# Patient Record
Sex: Female | Born: 1950
Health system: Southern US, Community
[De-identification: ages and names within clinical notes are randomized; demographics above are authoritative.]

## PROBLEM LIST (undated history)

## (undated) DIAGNOSIS — R32 Unspecified urinary incontinence: Secondary | ICD-10-CM

## (undated) DIAGNOSIS — F329 Major depressive disorder, single episode, unspecified: Secondary | ICD-10-CM

## (undated) DIAGNOSIS — E538 Deficiency of other specified B group vitamins: Secondary | ICD-10-CM

## (undated) DIAGNOSIS — E034 Atrophy of thyroid (acquired): Secondary | ICD-10-CM

## (undated) DIAGNOSIS — G458 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: Secondary | ICD-10-CM

## (undated) DIAGNOSIS — R011 Cardiac murmur, unspecified: Secondary | ICD-10-CM

## (undated) DIAGNOSIS — E1140 Type 2 diabetes mellitus with diabetic neuropathy, unspecified: Secondary | ICD-10-CM

## (undated) DIAGNOSIS — G2581 Restless legs syndrome: Secondary | ICD-10-CM

## (undated) DIAGNOSIS — E119 Type 2 diabetes mellitus without complications: Secondary | ICD-10-CM

## (undated) DIAGNOSIS — M797 Fibromyalgia: Secondary | ICD-10-CM

## (undated) DIAGNOSIS — I480 Paroxysmal atrial fibrillation: Secondary | ICD-10-CM

## (undated) DIAGNOSIS — I10 Essential (primary) hypertension: Secondary | ICD-10-CM

## (undated) DIAGNOSIS — F32A Depression, unspecified: Secondary | ICD-10-CM

## (undated) DIAGNOSIS — M810 Age-related osteoporosis without current pathological fracture: Secondary | ICD-10-CM

## (undated) DIAGNOSIS — E785 Hyperlipidemia, unspecified: Secondary | ICD-10-CM

## (undated) DIAGNOSIS — K222 Esophageal obstruction: Secondary | ICD-10-CM

## (undated) DIAGNOSIS — G609 Hereditary and idiopathic neuropathy, unspecified: Secondary | ICD-10-CM

## (undated) DIAGNOSIS — M1990 Unspecified osteoarthritis, unspecified site: Secondary | ICD-10-CM

## (undated) DIAGNOSIS — K08109 Complete loss of teeth, unspecified cause, unspecified class: Secondary | ICD-10-CM

## (undated) DIAGNOSIS — Z972 Presence of dental prosthetic device (complete) (partial): Secondary | ICD-10-CM

## (undated) DIAGNOSIS — R413 Other amnesia: Secondary | ICD-10-CM

## (undated) DIAGNOSIS — I4891 Unspecified atrial fibrillation: Secondary | ICD-10-CM

## (undated) DIAGNOSIS — R260 Ataxic gait: Secondary | ICD-10-CM

## (undated) DIAGNOSIS — F5101 Primary insomnia: Secondary | ICD-10-CM

## (undated) DIAGNOSIS — IMO0002 Reserved for concepts with insufficient information to code with codable children: Secondary | ICD-10-CM

## (undated) DIAGNOSIS — Z973 Presence of spectacles and contact lenses: Secondary | ICD-10-CM

## (undated) DIAGNOSIS — L03311 Cellulitis of abdominal wall: Secondary | ICD-10-CM

## (undated) DIAGNOSIS — G4700 Insomnia, unspecified: Secondary | ICD-10-CM

## (undated) DIAGNOSIS — F419 Anxiety disorder, unspecified: Secondary | ICD-10-CM

## (undated) HISTORY — DX: Hereditary and idiopathic neuropathy, unspecified: G60.9

## (undated) HISTORY — DX: Cardiac murmur, unspecified: R01.1

## (undated) HISTORY — DX: Essential (primary) hypertension: I10

## (undated) HISTORY — DX: Type 2 diabetes mellitus with diabetic neuropathy, unspecified: E11.40

## (undated) HISTORY — DX: Cellulitis of abdominal wall: L03.311

## (undated) HISTORY — DX: Hyperlipidemia, unspecified: E78.5

## (undated) HISTORY — DX: 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: G45.8

## (undated) HISTORY — DX: Unspecified atrial fibrillation: I48.91

## (undated) HISTORY — DX: Ataxic gait: R26.0

## (undated) HISTORY — PX: UMBILICAL HERNIA REPAIR: SHX196

## (undated) HISTORY — DX: Deficiency of other specified B group vitamins: E53.8

## (undated) HISTORY — DX: Restless legs syndrome: G25.81

## (undated) HISTORY — DX: Other amnesia: R41.3

## (undated) HISTORY — DX: Type 2 diabetes mellitus without complications: E11.9

## (undated) HISTORY — PX: CATARACT EXTRACTION, BILATERAL: SHX1313

## (undated) HISTORY — DX: Paroxysmal atrial fibrillation: I48.0

## (undated) HISTORY — DX: Fibromyalgia: M79.7

## (undated) HISTORY — PX: KNEE ARTHROSCOPY W/ LATERAL RETINACULAR REPAIR: SHX1875

## (undated) HISTORY — DX: Esophageal obstruction: K22.2

## (undated) HISTORY — DX: Anxiety disorder, unspecified: F41.9

## (undated) HISTORY — DX: Reserved for concepts with insufficient information to code with codable children: IMO0002

## (undated) HISTORY — DX: Insomnia, unspecified: G47.00

## (undated) HISTORY — DX: Primary insomnia: F51.01

## (undated) HISTORY — DX: Unspecified urinary incontinence: R32

## (undated) HISTORY — DX: Atrophy of thyroid (acquired): E03.4

---

## 1969-07-17 HISTORY — PX: CHOLECYSTECTOMY: SHX55

## 1981-07-17 HISTORY — PX: BREAST REDUCTION SURGERY: SHX8

## 1996-07-17 HISTORY — PX: KNEE ARTHROSCOPY: SHX127

## 2001-11-19 ENCOUNTER — Other Ambulatory Visit: Admission: RE | Admit: 2001-11-19 | Discharge: 2001-11-19 | Payer: Self-pay | Admitting: Obstetrics and Gynecology

## 2002-07-17 HISTORY — PX: DILATION AND CURETTAGE OF UTERUS: SHX78

## 2002-09-30 ENCOUNTER — Encounter: Payer: Self-pay | Admitting: Obstetrics and Gynecology

## 2002-10-03 ENCOUNTER — Ambulatory Visit (HOSPITAL_COMMUNITY): Admission: RE | Admit: 2002-10-03 | Discharge: 2002-10-03 | Payer: Self-pay | Admitting: Obstetrics and Gynecology

## 2002-10-03 ENCOUNTER — Encounter (INDEPENDENT_AMBULATORY_CARE_PROVIDER_SITE_OTHER): Payer: Self-pay | Admitting: Specialist

## 2003-06-09 ENCOUNTER — Other Ambulatory Visit: Admission: RE | Admit: 2003-06-09 | Discharge: 2003-06-09 | Payer: Self-pay | Admitting: Obstetrics and Gynecology

## 2005-03-14 ENCOUNTER — Other Ambulatory Visit: Admission: RE | Admit: 2005-03-14 | Discharge: 2005-03-14 | Payer: Self-pay | Admitting: *Deleted

## 2005-07-17 HISTORY — PX: SHOULDER ARTHROSCOPY W/ ROTATOR CUFF REPAIR: SHX2400

## 2006-02-02 ENCOUNTER — Encounter: Admission: RE | Admit: 2006-02-02 | Discharge: 2006-02-02 | Payer: Self-pay | Admitting: Obstetrics & Gynecology

## 2006-04-04 ENCOUNTER — Other Ambulatory Visit: Admission: RE | Admit: 2006-04-04 | Discharge: 2006-04-04 | Payer: Self-pay | Admitting: Obstetrics & Gynecology

## 2006-06-27 ENCOUNTER — Ambulatory Visit (HOSPITAL_BASED_OUTPATIENT_CLINIC_OR_DEPARTMENT_OTHER): Admission: RE | Admit: 2006-06-27 | Discharge: 2006-06-27 | Payer: Self-pay | Admitting: Orthopedic Surgery

## 2007-04-10 ENCOUNTER — Other Ambulatory Visit: Admission: RE | Admit: 2007-04-10 | Discharge: 2007-04-10 | Payer: Self-pay | Admitting: Obstetrics and Gynecology

## 2008-07-17 HISTORY — PX: DIAGNOSTIC LAPAROSCOPY: SUR761

## 2010-08-07 ENCOUNTER — Encounter: Payer: Self-pay | Admitting: Obstetrics & Gynecology

## 2010-12-02 LAB — HM COLONOSCOPY

## 2010-12-02 NOTE — Op Note (Signed)
NAMEGWENDOLA, April Munoz                             ACCOUNT NO.:  [id number]   MEDICAL RECORD NO.:  [id number]                   PATIENT TYPE:  AMB   LOCATION:  DAY                                  FACILITY:  [facility]   PHYSICIAN:  Laqueta Linden, M.D.                 DATE OF BIRTH:  13-Nov-1950   DATE OF PROCEDURE:  10/03/2002  DATE OF DISCHARGE:                                 OPERATIVE REPORT   PREOPERATIVE DIAGNOSIS:  Abnormal uterine bleeding due to endometrial  polyps.   POSTOPERATIVE DIAGNOSIS:  Abnormal uterine bleeding due to endometrial  polyps.   PROCEDURE:  Hysteroscopic resection.   SURGEON:  Laqueta Linden, M.D.   ANESTHESIA:  MAC sedation with paracervical block.   ESTIMATED BLOOD LOSS:  Less than 50 mL.   SORBITOL NET INTAKE:  90 mL.   COMPLICATIONS:  None.   INDICATIONS:  April Munoz is a 60 year old, perimenopausal female with  persistent abnormal uterine bleeding on cyclic progesterone.  Pelvic  ultrasound showed evidence of adenomyosis and two posterior wall polyps.  The patient is therefore to undergo outpatient hysteroscopic resection of  the polyps in an effort to improve her bleeding pattern.  She is then  informed of the risks, benefits, alternatives, and complications of the  procedure, including the limitations of the procedure and the likelihood  that she will need continued progesterone cycling postoperatively.  She has  seen the informed consent film, voiced her understanding and acceptance of  all risks and limitations and agrees to proceed.   DESCRIPTION OF PROCEDURE:  The patient was taken to the operating room and  after proper identification and consents were ascertained, she was placed on  the operating table in the supine position.  After adequate sedation was  accomplished, she was placed in the Senath stirrups and the perineum and  vagina were prepped and draped in a routine sterile fashion.  A  transurethral Foley was placed which was removed  at the conclusion of the  procedure.  Bimanual examination confirmed an anterior, normal-size, mobile  uterus.  A speculum was placed in the vagina.  The vagina was noted to be  extremely long and the cervix way up in the vagina with a negligible amount  of descent noted.  The endometrial cavity sounded to approximately 9 cm.  The internal os was gently dilated to a #33 Pratt dilator after placement of  a paracervical block, utilizing 10 mL of 1% plain Xylocaine  circumferentially.  The resectoscope was then inserted under direct vision  using continuous sorbitol infusion and video.  The endocervical canal was  free of lesions.  There were two posterior wall polyps, measuring  approximately 1 cm, consistent with the findings on sonohysterogram.  Both  tubal ostia were visualized.  There were no other lesions identified within  the endometrial cavity.  The resectoscope was placed on routine settings,  and the posterior fundal endometrium was then resected in a systematic  fashion with removal of both polyps and the majority of the posterior  endometrial tissue as well.  Several small bleeding points were cauterized.  All tissue pieces were evacuated and sent to pathology.  The resectoscope  was then removed.  There was no active bleeding from the tenaculum site or  from the cervix.  Estimated blood loss was less than 50 mL.  Sorbitol net  intake 90 mL.  Complications none.   The patient was awakened and stable on transfer to the recovery room.  She  will be observed and discharged per anesthesia protocol.  She received  Toradol 30 mg IV and 30 mg IM at the initiation of the procedure for use for  postoperative cramping.  She will be discharged per anesthesia and is to  follow up in the office in 2-3 weeks' time.  She was given routine verbal  and written discharge instructions.  She was told to continue taking all of  her routine medications and take Advil, Aleve, or Tylenol as needed for   cramping.  She was to call prior to scheduled follow up for excessive pain,  fever, bleeding, or other concerns.                                               Laqueta Linden, M.D.    LKS/MEDQ  D:  10/03/2002  T:  10/03/2002  Job:  [id number]

## 2010-12-02 NOTE — Op Note (Signed)
[name], [name]                 ACCOUNT NO.:  [id number]   MEDICAL RECORD NO.:  [id number]          PATIENT TYPE:  AMB   LOCATION:  DSC                          FACILITY:  [facility]   PHYSICIAN:  Mila Homer. Sherlean Foot, M.D. DATE OF BIRTH:  06/19/1951   DATE OF PROCEDURE:  06/27/2006  DATE OF DISCHARGE:                               OPERATIVE REPORT   SURGEON:  Georgena Spurling, MD   ASSISTANT:  None.   ANESTHESIA:  General.   PREOPERATIVE DIAGNOSIS:  Left shoulder impingement syndrome.   POSTOPERATIVE DIAGNOSIS:  Left shoulder impingement syndrome.   PROCEDURE:  Left shoulder arthroscopy, subacromial decompression,  glenohumeral debridement.   INDICATIONS FOR PROCEDURE:  The patient is a 60 year old with __________  symptoms, MRI evidence of tendinosus of the rotator cuff, type-2  acromion, and some potential internal derangement.  Informed consent was  obtained.   DESCRIPTION OF PROCEDURE:  The patient was laid supine under general  anesthesia.  The patient was placed in the beach chair position.  The  left shoulder was prepped and draped in the usual sterile fashion.  Inferolateral and inferomedial portals were created with a #11 blade  __________ and then arthroscopy revealed no chondromalacia.  There was  anterior labral tearing as well as approximately 30% of the biceps,  which caused a large frayed area of the tendon impinging in the joint.  This was debrided with a 3.2 gator shaver.  The undersurface of the  rotator cuff had some partial thickness tearing and this was debrided as  well.  I then went into the subacromial space.  I performed a bursectomy  with a 3.2 shaver through the lateral portal.  I then cleaned off the  undersurface of the acromion and performed an acromioplasty but did not  violate the AC joint.  I released the CA ligament with ArthroCare  debridement wand.  Then, lavaged and closed with 4-0 nylon sutures.  Dressed with adaptic and 4 x 4's, ABDs, 2-inch  silk tape, and simple  sling.   COMPLICATIONS:  None.   DRAINS:  None.           ______________________________  Mila Homer. Sherlean Foot, M.D.     SDL/MEDQ  D:  06/27/2006  T:  06/28/2006  Job:  [id number]

## 2012-08-15 DIAGNOSIS — G609 Hereditary and idiopathic neuropathy, unspecified: Secondary | ICD-10-CM | POA: Diagnosis not present

## 2012-08-15 DIAGNOSIS — IMO0002 Reserved for concepts with insufficient information to code with codable children: Secondary | ICD-10-CM | POA: Diagnosis not present

## 2012-09-26 DIAGNOSIS — IMO0002 Reserved for concepts with insufficient information to code with codable children: Secondary | ICD-10-CM | POA: Diagnosis not present

## 2012-09-26 DIAGNOSIS — G609 Hereditary and idiopathic neuropathy, unspecified: Secondary | ICD-10-CM | POA: Diagnosis not present

## 2012-10-01 DIAGNOSIS — R269 Unspecified abnormalities of gait and mobility: Secondary | ICD-10-CM | POA: Diagnosis not present

## 2012-10-03 DIAGNOSIS — R269 Unspecified abnormalities of gait and mobility: Secondary | ICD-10-CM | POA: Diagnosis not present

## 2012-10-15 DIAGNOSIS — R269 Unspecified abnormalities of gait and mobility: Secondary | ICD-10-CM | POA: Diagnosis not present

## 2012-10-18 DIAGNOSIS — R269 Unspecified abnormalities of gait and mobility: Secondary | ICD-10-CM | POA: Diagnosis not present

## 2012-11-14 DIAGNOSIS — M7500 Adhesive capsulitis of unspecified shoulder: Secondary | ICD-10-CM | POA: Diagnosis not present

## 2012-11-14 DIAGNOSIS — M719 Bursopathy, unspecified: Secondary | ICD-10-CM | POA: Diagnosis not present

## 2012-11-14 DIAGNOSIS — S42253A Displaced fracture of greater tuberosity of unspecified humerus, initial encounter for closed fracture: Secondary | ICD-10-CM | POA: Diagnosis not present

## 2012-11-14 DIAGNOSIS — M25519 Pain in unspecified shoulder: Secondary | ICD-10-CM | POA: Diagnosis not present

## 2012-11-14 DIAGNOSIS — M67919 Unspecified disorder of synovium and tendon, unspecified shoulder: Secondary | ICD-10-CM | POA: Diagnosis not present

## 2012-11-25 DIAGNOSIS — M1710 Unilateral primary osteoarthritis, unspecified knee: Secondary | ICD-10-CM | POA: Diagnosis not present

## 2012-11-25 DIAGNOSIS — M21169 Varus deformity, not elsewhere classified, unspecified knee: Secondary | ICD-10-CM | POA: Diagnosis not present

## 2012-11-25 DIAGNOSIS — M25569 Pain in unspecified knee: Secondary | ICD-10-CM | POA: Diagnosis not present

## 2012-11-25 DIAGNOSIS — M6281 Muscle weakness (generalized): Secondary | ICD-10-CM | POA: Diagnosis not present

## 2012-11-25 DIAGNOSIS — R269 Unspecified abnormalities of gait and mobility: Secondary | ICD-10-CM | POA: Diagnosis not present

## 2013-02-24 DIAGNOSIS — G609 Hereditary and idiopathic neuropathy, unspecified: Secondary | ICD-10-CM | POA: Insufficient documentation

## 2013-02-24 DIAGNOSIS — F329 Major depressive disorder, single episode, unspecified: Secondary | ICD-10-CM | POA: Insufficient documentation

## 2013-02-24 DIAGNOSIS — E119 Type 2 diabetes mellitus without complications: Secondary | ICD-10-CM

## 2013-02-24 DIAGNOSIS — M797 Fibromyalgia: Secondary | ICD-10-CM

## 2013-02-24 DIAGNOSIS — E785 Hyperlipidemia, unspecified: Secondary | ICD-10-CM

## 2013-02-24 DIAGNOSIS — I10 Essential (primary) hypertension: Secondary | ICD-10-CM

## 2013-02-24 DIAGNOSIS — I119 Hypertensive heart disease without heart failure: Secondary | ICD-10-CM | POA: Insufficient documentation

## 2013-02-24 DIAGNOSIS — IMO0002 Reserved for concepts with insufficient information to code with codable children: Secondary | ICD-10-CM

## 2013-02-24 DIAGNOSIS — E782 Mixed hyperlipidemia: Secondary | ICD-10-CM | POA: Insufficient documentation

## 2013-02-24 DIAGNOSIS — M810 Age-related osteoporosis without current pathological fracture: Secondary | ICD-10-CM | POA: Insufficient documentation

## 2013-02-24 HISTORY — DX: Type 2 diabetes mellitus without complications: E11.9

## 2013-02-24 HISTORY — DX: Hereditary and idiopathic neuropathy, unspecified: G60.9

## 2013-02-24 HISTORY — DX: Reserved for concepts with insufficient information to code with codable children: IMO0002

## 2013-02-24 HISTORY — DX: Fibromyalgia: M79.7

## 2013-02-24 HISTORY — DX: Hypertensive heart disease without heart failure: I11.9

## 2013-02-24 HISTORY — DX: Essential (primary) hypertension: I10

## 2013-02-24 HISTORY — DX: Hyperlipidemia, unspecified: E78.5

## 2013-02-24 HISTORY — DX: Mixed hyperlipidemia: E78.2

## 2013-03-14 DIAGNOSIS — G2581 Restless legs syndrome: Secondary | ICD-10-CM | POA: Insufficient documentation

## 2013-03-14 HISTORY — DX: Restless legs syndrome: G25.81

## 2013-11-06 ENCOUNTER — Encounter (HOSPITAL_BASED_OUTPATIENT_CLINIC_OR_DEPARTMENT_OTHER): Payer: Self-pay | Admitting: *Deleted

## 2013-11-06 ENCOUNTER — Other Ambulatory Visit: Payer: Self-pay | Admitting: Orthopedic Surgery

## 2013-11-06 NOTE — Progress Notes (Signed)
Will need istat and ekg if needed-called pcp for old notes

## 2013-11-11 ENCOUNTER — Ambulatory Visit (HOSPITAL_BASED_OUTPATIENT_CLINIC_OR_DEPARTMENT_OTHER): Payer: Medicare HMO | Admitting: Certified Registered"

## 2013-11-11 ENCOUNTER — Ambulatory Visit (HOSPITAL_BASED_OUTPATIENT_CLINIC_OR_DEPARTMENT_OTHER)
Admission: RE | Admit: 2013-11-11 | Discharge: 2013-11-11 | Disposition: A | Discharge status: Home / Self Care | Payer: Medicare HMO | Source: Ambulatory Visit | Attending: Orthopedic Surgery | Admitting: Orthopedic Surgery

## 2013-11-11 ENCOUNTER — Encounter (HOSPITAL_BASED_OUTPATIENT_CLINIC_OR_DEPARTMENT_OTHER): Payer: Self-pay | Admitting: Orthopedic Surgery

## 2013-11-11 ENCOUNTER — Encounter (HOSPITAL_BASED_OUTPATIENT_CLINIC_OR_DEPARTMENT_OTHER): Payer: Medicare HMO | Admitting: Certified Registered"

## 2013-11-11 ENCOUNTER — Encounter (HOSPITAL_BASED_OUTPATIENT_CLINIC_OR_DEPARTMENT_OTHER)
Admission: RE | Disposition: A | Discharge status: Home / Self Care | Payer: Self-pay | Source: Ambulatory Visit | Attending: Orthopedic Surgery

## 2013-11-11 DIAGNOSIS — I10 Essential (primary) hypertension: Secondary | ICD-10-CM | POA: Insufficient documentation

## 2013-11-11 DIAGNOSIS — M65849 Other synovitis and tenosynovitis, unspecified hand: Secondary | ICD-10-CM

## 2013-11-11 DIAGNOSIS — M1990 Unspecified osteoarthritis, unspecified site: Secondary | ICD-10-CM | POA: Insufficient documentation

## 2013-11-11 DIAGNOSIS — Z888 Allergy status to other drugs, medicaments and biological substances status: Secondary | ICD-10-CM | POA: Insufficient documentation

## 2013-11-11 DIAGNOSIS — M6530 Trigger finger, unspecified finger: Secondary | ICD-10-CM | POA: Insufficient documentation

## 2013-11-11 DIAGNOSIS — F3289 Other specified depressive episodes: Secondary | ICD-10-CM | POA: Insufficient documentation

## 2013-11-11 DIAGNOSIS — F329 Major depressive disorder, single episode, unspecified: Secondary | ICD-10-CM | POA: Insufficient documentation

## 2013-11-11 DIAGNOSIS — M65839 Other synovitis and tenosynovitis, unspecified forearm: Secondary | ICD-10-CM | POA: Insufficient documentation

## 2013-11-11 DIAGNOSIS — M6740 Ganglion, unspecified site: Secondary | ICD-10-CM | POA: Insufficient documentation

## 2013-11-11 DIAGNOSIS — Z79899 Other long term (current) drug therapy: Secondary | ICD-10-CM | POA: Insufficient documentation

## 2013-11-11 DIAGNOSIS — E1142 Type 2 diabetes mellitus with diabetic polyneuropathy: Secondary | ICD-10-CM | POA: Insufficient documentation

## 2013-11-11 DIAGNOSIS — E1149 Type 2 diabetes mellitus with other diabetic neurological complication: Secondary | ICD-10-CM | POA: Insufficient documentation

## 2013-11-11 HISTORY — DX: Type 2 diabetes mellitus without complications: E11.9

## 2013-11-11 HISTORY — DX: Complete loss of teeth, unspecified cause, unspecified class: K08.109

## 2013-11-11 HISTORY — PX: TRIGGER FINGER RELEASE: SHX641

## 2013-11-11 HISTORY — DX: Presence of spectacles and contact lenses: Z97.3

## 2013-11-11 HISTORY — DX: Essential (primary) hypertension: I10

## 2013-11-11 HISTORY — DX: Unspecified osteoarthritis, unspecified site: M19.90

## 2013-11-11 HISTORY — PX: MASS EXCISION: SHX2000

## 2013-11-11 HISTORY — DX: Depression, unspecified: F32.A

## 2013-11-11 HISTORY — DX: Complete loss of teeth, unspecified cause, unspecified class: Z97.2

## 2013-11-11 HISTORY — DX: Major depressive disorder, single episode, unspecified: F32.9

## 2013-11-11 HISTORY — DX: Age-related osteoporosis without current pathological fracture: M81.0

## 2013-11-11 LAB — POCT I-STAT, CHEM 8
BUN: 13 mg/dL (ref 6–23)
Calcium, Ion: 1.13 mmol/L (ref 1.13–1.30)
Chloride: 105 mEq/L (ref 96–112)
Creatinine, Ser: 0.8 mg/dL (ref 0.50–1.10)
Glucose, Bld: 123 mg/dL — ABNORMAL HIGH (ref 70–99)
HCT: 41 % (ref 36.0–46.0)
Hemoglobin: 13.9 g/dL (ref 12.0–15.0)
Potassium: 5 mEq/L (ref 3.7–5.3)
Sodium: 142 mEq/L (ref 137–147)
TCO2: 30 mmol/L (ref 0–100)

## 2013-11-11 LAB — GLUCOSE, CAPILLARY: Glucose-Capillary: 101 mg/dL — ABNORMAL HIGH (ref 70–99)

## 2013-11-11 SURGERY — EXCISION MASS
Anesthesia: Regional | Site: Finger | Laterality: Left

## 2013-11-11 MED ORDER — HYDROMORPHONE HCL PF 1 MG/ML IJ SOLN: 0.2500 mg | INTRAMUSCULAR | Status: DC | PRN

## 2013-11-11 MED ORDER — OXYCODONE HCL 5 MG PO TABS: 5.0000 mg | ORAL_TABLET | Freq: Once | ORAL | Status: DC | PRN

## 2013-11-11 MED ORDER — LIDOCAINE HCL (CARDIAC) 20 MG/ML IV SOLN
INTRAVENOUS | Status: DC | PRN
  Administered 2013-11-11: 30 mg via INTRAVENOUS

## 2013-11-11 MED ORDER — FENTANYL CITRATE 0.05 MG/ML IJ SOLN
INTRAMUSCULAR | Status: DC | PRN
  Administered 2013-11-11: 50 ug via INTRAVENOUS

## 2013-11-11 MED ORDER — CHLORHEXIDINE GLUCONATE 4 % EX LIQD: 60.0000 mL | Freq: Once | CUTANEOUS | Status: DC

## 2013-11-11 MED ORDER — BUPIVACAINE HCL (PF) 0.25 % IJ SOLN
INTRAMUSCULAR | Status: AC
  Filled 2013-11-11: qty 30

## 2013-11-11 MED ORDER — MIDAZOLAM HCL 2 MG/2ML IJ SOLN
INTRAMUSCULAR | Status: AC
  Filled 2013-11-11: qty 2

## 2013-11-11 MED ORDER — LIDOCAINE HCL (PF) 0.5 % IJ SOLN
INTRAMUSCULAR | Status: DC | PRN
Start: 2013-11-11 — End: 2013-11-11
  Administered 2013-11-11: 30 mL via INTRAVENOUS

## 2013-11-11 MED ORDER — CEFAZOLIN SODIUM-DEXTROSE 2-3 GM-% IV SOLR
INTRAVENOUS | Status: AC
  Filled 2013-11-11: qty 50

## 2013-11-11 MED ORDER — CEFAZOLIN SODIUM-DEXTROSE 2-3 GM-% IV SOLR: 2.0000 g | INTRAVENOUS | Status: DC

## 2013-11-11 MED ORDER — BUPIVACAINE HCL (PF) 0.25 % IJ SOLN
INTRAMUSCULAR | Status: DC | PRN
  Administered 2013-11-11: 9 mL

## 2013-11-11 MED ORDER — ONDANSETRON HCL 4 MG/2ML IJ SOLN
INTRAMUSCULAR | Status: DC | PRN
  Administered 2013-11-11: 4 mg via INTRAVENOUS

## 2013-11-11 MED ORDER — MIDAZOLAM HCL 5 MG/5ML IJ SOLN
INTRAMUSCULAR | Status: DC | PRN
  Administered 2013-11-11: 1 mg via INTRAVENOUS

## 2013-11-11 MED ORDER — OXYCODONE HCL 5 MG/5ML PO SOLN: 5.0000 mg | Freq: Once | ORAL | Status: DC | PRN

## 2013-11-11 MED ORDER — FENTANYL CITRATE 0.05 MG/ML IJ SOLN
INTRAMUSCULAR | Status: AC
  Filled 2013-11-11: qty 2

## 2013-11-11 MED ORDER — MIDAZOLAM HCL 2 MG/2ML IJ SOLN: 1.0000 mg | INTRAMUSCULAR | Status: DC | PRN

## 2013-11-11 MED ORDER — PROMETHAZINE HCL 25 MG/ML IJ SOLN: 6.2500 mg | INTRAMUSCULAR | Status: DC | PRN

## 2013-11-11 MED ORDER — HYDROCODONE-ACETAMINOPHEN 5-325 MG PO TABS: 1.0000 | ORAL_TABLET | Freq: Four times a day (QID) | ORAL | Status: DC | PRN

## 2013-11-11 MED ORDER — CEFAZOLIN SODIUM-DEXTROSE 2-3 GM-% IV SOLR
2.0000 g | INTRAVENOUS | Status: AC
  Administered 2013-11-11: 2 g via INTRAVENOUS

## 2013-11-11 MED ORDER — PROPOFOL INFUSION 10 MG/ML OPTIME
INTRAVENOUS | Status: DC | PRN
Start: 2013-11-11 — End: 2013-11-11
  Administered 2013-11-11: 75 ug/kg/min via INTRAVENOUS

## 2013-11-11 MED ORDER — LACTATED RINGERS IV SOLN
INTRAVENOUS | Status: DC
  Administered 2013-11-11: 08:00:00 via INTRAVENOUS

## 2013-11-11 MED ORDER — FENTANYL CITRATE 0.05 MG/ML IJ SOLN: 50.0000 ug | INTRAMUSCULAR | Status: DC | PRN

## 2013-11-11 SURGICAL SUPPLY — 55 items
BANDAGE COBAN STERILE 2 (GAUZE/BANDAGES/DRESSINGS) ×2 IMPLANT
BLADE 15 SAFETY STRL DISP (BLADE) ×2 IMPLANT
BLADE MINI RND TIP GREEN BEAV (BLADE) ×1 IMPLANT
BNDG CMPR 9X4 STRL LF SNTH (GAUZE/BANDAGES/DRESSINGS)
BNDG COHESIVE 1X5 TAN STRL LF (GAUZE/BANDAGES/DRESSINGS) IMPLANT
BNDG COHESIVE 3X5 TAN STRL LF (GAUZE/BANDAGES/DRESSINGS) IMPLANT
BNDG ESMARK 4X9 LF (GAUZE/BANDAGES/DRESSINGS) IMPLANT
BNDG GAUZE ELAST 4 BULKY (GAUZE/BANDAGES/DRESSINGS) IMPLANT
CHLORAPREP W/TINT 26ML (MISCELLANEOUS) ×2 IMPLANT
CORDS BIPOLAR (ELECTRODE) ×2 IMPLANT
COVER MAYO STAND STRL (DRAPES) ×2 IMPLANT
COVER TABLE BACK 60X90 (DRAPES) ×2 IMPLANT
CUFF TOURNIQUET SINGLE 18IN (TOURNIQUET CUFF) ×1 IMPLANT
DECANTER SPIKE VIAL GLASS SM (MISCELLANEOUS) IMPLANT
DRAIN PENROSE 1/2X12 LTX STRL (WOUND CARE) IMPLANT
DRAPE EXTREMITY T 121X128X90 (DRAPE) ×2 IMPLANT
DRAPE SURG 17X23 STRL (DRAPES) ×2 IMPLANT
GAUZE SPONGE 4X4 12PLY STRL (GAUZE/BANDAGES/DRESSINGS) ×2 IMPLANT
GAUZE XEROFORM 1X8 LF (GAUZE/BANDAGES/DRESSINGS) ×2 IMPLANT
GLOVE BIO SURGEON STRL SZ7.5 (GLOVE) ×1 IMPLANT
GLOVE BIOGEL PI IND STRL 7.0 (GLOVE) IMPLANT
GLOVE BIOGEL PI IND STRL 8 (GLOVE) IMPLANT
GLOVE BIOGEL PI IND STRL 8.5 (GLOVE) ×1 IMPLANT
GLOVE BIOGEL PI INDICATOR 7.0 (GLOVE) ×1
GLOVE BIOGEL PI INDICATOR 8 (GLOVE) ×1
GLOVE BIOGEL PI INDICATOR 8.5 (GLOVE) ×1
GLOVE ECLIPSE 7.0 STRL STRAW (GLOVE) ×1 IMPLANT
GLOVE SURG ORTHO 8.0 STRL STRW (GLOVE) ×2 IMPLANT
GOWN STRL REUS W/ TWL LRG LVL3 (GOWN DISPOSABLE) ×1 IMPLANT
GOWN STRL REUS W/TWL LRG LVL3 (GOWN DISPOSABLE) ×2
GOWN STRL REUS W/TWL XL LVL3 (GOWN DISPOSABLE) ×3 IMPLANT
NDL BLD DRAW 23GX1  MC LAB (NEEDLE) IMPLANT
NDL SAFETY ECLIPSE 18X1.5 (NEEDLE) IMPLANT
NEEDLE 27GAX1X1/2 (NEEDLE) ×2 IMPLANT
NEEDLE BLD DRAW 23GX1   MC LAB (NEEDLE) ×1
NEEDLE BLD DRAW 23GX1  MC LAB (NEEDLE) ×1 IMPLANT
NEEDLE HYPO 18GX1.5 SHARP (NEEDLE)
NS IRRIG 1000ML POUR BTL (IV SOLUTION) ×2 IMPLANT
PACK BASIN DAY SURGERY FS (CUSTOM PROCEDURE TRAY) ×2 IMPLANT
PAD CAST 3X4 CTTN HI CHSV (CAST SUPPLIES) IMPLANT
PADDING CAST ABS 3INX4YD NS (CAST SUPPLIES)
PADDING CAST ABS 4INX4YD NS (CAST SUPPLIES) ×1
PADDING CAST ABS COTTON 3X4 (CAST SUPPLIES) IMPLANT
PADDING CAST ABS COTTON 4X4 ST (CAST SUPPLIES) ×1 IMPLANT
PADDING CAST COTTON 3X4 STRL (CAST SUPPLIES)
SPLINT PLASTER CAST XFAST 3X15 (CAST SUPPLIES) IMPLANT
SPLINT PLASTER XTRA FASTSET 3X (CAST SUPPLIES)
STOCKINETTE 4X48 STRL (DRAPES) ×2 IMPLANT
SUT VIC AB 4-0 P2 18 (SUTURE) IMPLANT
SUT VICRYL RAPID 5 0 P 3 (SUTURE) IMPLANT
SUT VICRYL RAPIDE 4/0 PS 2 (SUTURE) ×2 IMPLANT
SYR BULB 3OZ (MISCELLANEOUS) ×2 IMPLANT
SYR CONTROL 10ML LL (SYRINGE) ×2 IMPLANT
TOWEL OR 17X24 6PK STRL BLUE (TOWEL DISPOSABLE) ×4 IMPLANT
UNDERPAD 30X30 INCONTINENT (UNDERPADS AND DIAPERS) ×2 IMPLANT

## 2013-11-11 NOTE — Discharge Instructions (Addendum)

## 2013-11-11 NOTE — H&P (Signed)
April Munoz is a 63 year-old left-hand dominant female with a mass on her left index finger dorsal aspect distal interphalangeal joint.  This has been present for four to five months.  She has opened it on several occasions thinking it was a foreign body which she was unable to retrieve. She states that it drained a clear fluid. She has a history of diabetes, peripheral neuropathy, she has a mass on her right palm.  She also complains of triggering of her left ring.  She has no history of other injuries. She does have history of diabetes and history of arthritis.  There is family history of arthritis.  She is [place] descent.  She does not have masses on her feet. She complains of a constant, moderate throbbing aching type pain with a feeling of swelling to the index finger.She has completed her course of antibiotics; there is no evidence of infection at the present time.     ALLERGIES:      None. MEDICATIONS:      Metoprolol, metformin and venlafaxine. SURGICAL HISTORY:     Stomach hernia, cholecystectomy, knee surgery. FAMILY MEDICAL HISTORY:    Positive for heart disease, high blood pressure and arthritis.  SOCIAL HISTORY:      She does not smoke or drink. She is married.  She does not work outside the home.  REVIEW OF SYSTEMS:      Positive for glasses, hearing loss, ringing in her ears, high blood pressure, balance problems, nervousness, sleep disorder, anemia, peripheral neuropathy secondary to her diabetes.   April Munoz is an 63 y.o. female.   Chief Complaint: mucoid cyst left index finger and STS left ring finger HPI: see above  Past Medical History  Diagnosis Date  . Hypertension   . Diabetes mellitus without complication   . Arthritis   . Wears glasses   . Full dentures   . Osteoporosis   . Depression     Past Surgical History  Procedure Laterality Date  . Umbilical hernia repair  2008  . Diagnostic laparoscopy  2010    lysis of adhesions  . Knee arthroscopy  1998    left   . Cholecystectomy  1971    open  . Shoulder arthroscopy w/ rotator cuff repair  2007    left  . Dilation and curettage of uterus  2004    History reviewed. No pertinent family history. Social History:  reports that she has never smoked. She does not have any smokeless tobacco history on file. She reports that she does not drink alcohol or use illicit drugs.  Allergies:  Allergies  Allergen Reactions  . Prednisone     In high doses causes her to feel crazy    No prescriptions prior to admission    No results found for this or any previous visit (from the past 48 hour(s)).  No results found.   Pertinent items are noted in HPI.  Height 5' 7.5" (1.715 m), weight 200 lb (90.719 kg).  General appearance: alert, cooperative and appears stated age Head: Normocephalic, without obvious abnormality Neck: no JVD Resp: clear to auscultation bilaterally Cardio: regular rate and rhythm, S1, S2 normal, no murmur, click, rub or gallop GI: soft, non-tender; bowel sounds normal; no masses,  no organomegaly Extremities: extremities normal, atraumatic, no cyanosis or edema Pulses: 2+ and symmetric Skin: Skin color, texture, turgor normal. No rashes or lesions Neurologic: Grossly normal Incision/Wound: na  Assessment/Plan We have discussed the possibility of excision of the cyst with debridement  distal interphalangeal joint of her left index finger along with release of the A-1 pulley of the left ring finger.    The pre, peri and postoperative course were discussed along with the risks and complications.  The patient is aware there is no guarantee with the surgery, possibility of infection, recurrence, injury to arteries, nerves, tendons, incomplete relief of symptoms and dystrophy. She is scheduled for release of A-1 pulley left ring and excision of mucoid cyst with debridement distal interphalangeal joint left index as an outpatient under regional anesthesia.  Wynonia Sours 11/11/2013,  5:28 AM

## 2013-11-11 NOTE — Anesthesia Postprocedure Evaluation (Signed)
  Anesthesia Post-op Note  Patient: April Munoz  Procedure(s) Performed: Procedure(s) with comments: EXCISION MUCOID CYST LEFT INDEX FINGER/DEBRIDEMENT LEFT INDEX FINGER (Left) - ANESTHESIA: IV REGIONAL/FAB RELEASE A-1 PULLEY LEFT RING FINGER (Left)  Patient Location: PACU  Anesthesia Type:Regional  Level of Consciousness: awake and alert   Airway and Oxygen Therapy: Patient Spontanous Breathing  Post-op Pain: mild  Post-op Assessment: Post-op Vital signs reviewed  Post-op Vital Signs: stable  Last Vitals:  Filed Vitals:   11/11/13 1122  BP: 138/85  Pulse: 95  Temp: 36.6 C  Resp: 16    Complications: No apparent anesthesia complications

## 2013-11-11 NOTE — Brief Op Note (Signed)
11/11/2013  10:09 AM  PATIENT:  April Munoz  63 y.o. female  PRE-OPERATIVE DIAGNOSIS:  MUCOID TUMOR LEFT INDEX FINGER/DEGENERATIVE ARTHRITIS/DISTAL JOINT LEFT INDEX FINGER/TRIGGER LEFT RING FINGER  POST-OPERATIVE DIAGNOSIS:  MUCOID TUMOR LEFT INDEX FINGER/DEGENERATIVE ARTHRITIS/DISTAL JOINT LEFT INDEX FINGER/TRIGGER LEFT RING FINGER  PROCEDURE:  Procedure(s) with comments: EXCISION MUCOID CYST LEFT INDEX FINGER/DEBRIDEMENT LEFT INDEX FINGER (Left) - ANESTHESIA: IV REGIONAL/FAB RELEASE A-1 PULLEY LEFT RING FINGER (Left)  SURGEON:  Surgeon(s) and Role:    * Wynonia Sours, MD - Primary    * Tennis Must, MD - Assisting  PHYSICIAN ASSISTANT:   ASSISTANTS: K Carter Kassel,MD   ANESTHESIA:   local and regional  EBL:  Total I/O In: 600 [I.V.:600] Out: -   BLOOD ADMINISTERED:none  DRAINS: none   LOCAL MEDICATIONS USED:  BUPIVICAINE   SPECIMEN:  Excision  DISPOSITION OF SPECIMEN:  PATHOLOGY  COUNTS:  YES  TOURNIQUET:   Total Tourniquet Time Documented: Upper Arm (Left) - 31 minutes Total: Upper Arm (Left) - 31 minutes   DICTATION: .Other Dictation: Dictation Number [id number]  PLAN OF CARE: Discharge to home after PACU  PATIENT DISPOSITION:  PACU - hemodynamically stable.

## 2013-11-11 NOTE — Anesthesia Preprocedure Evaluation (Addendum)
Anesthesia Evaluation  Patient identified by MRN, date of birth, ID band  History of Anesthesia Complications Negative for: history of anesthetic complications  Airway Mallampati: I  Neck ROM: Full    Dental  (+) Edentulous Upper, Edentulous Lower   Pulmonary  breath sounds clear to auscultation        Cardiovascular hypertension, Pt. on home beta blockers Rhythm:Regular Rate:Normal     Neuro/Psych PSYCHIATRIC DISORDERS Depression    GI/Hepatic   Endo/Other  diabetes, Oral Hypoglycemic Agents  Renal/GU      Musculoskeletal   Abdominal (+) + obese,   Peds  Hematology   Anesthesia Other Findings   Reproductive/Obstetrics                          Anesthesia Physical Anesthesia Plan  ASA: III  Anesthesia Plan: Regional   Post-op Pain Management:    Induction: Intravenous  Airway Management Planned: Simple Face Mask  Additional Equipment:   Intra-op Plan:   Post-operative Plan:   Informed Consent: I have reviewed the patients History and Physical, chart, labs and discussed the procedure including the risks, benefits and alternatives for the proposed anesthesia with the patient or authorized representative who has indicated his/her understanding and acceptance.     Plan Discussed with: CRNA and Surgeon  Anesthesia Plan Comments:         Anesthesia Quick Evaluation

## 2013-11-11 NOTE — Anesthesia Procedure Notes (Addendum)
Procedure Name: MAC Date/Time: 11/11/2013 9:30 AM Performed by: Morad Tal Pre-anesthesia Checklist: Patient identified, Emergency Drugs available, Suction available, Patient being monitored and Timeout performed Patient Re-evaluated:Patient Re-evaluated prior to inductionOxygen Delivery Method: Simple face mask   Anesthesia Regional Block:  Bier block (IV Regional)  Pre-Anesthetic Checklist: ,, timeout performed, Correct Patient, Correct Site, Correct Laterality, Correct Procedure,, site marked, surgical consent,, at surgeon's request Needles:  Injection technique: Single-shot  Needle Type: Other      Needle Gauge: 20 and 20 G    Additional Needles: Bier block (IV Regional) Narrative:   Performed by: Personally

## 2013-11-11 NOTE — Op Note (Signed)
Dictation Number [id number]

## 2013-11-11 NOTE — Transfer of Care (Signed)
Immediate Anesthesia Transfer of Care Note  Patient: April Munoz  Procedure(s) Performed: Procedure(s) with comments: EXCISION MUCOID CYST LEFT INDEX FINGER/DEBRIDEMENT LEFT INDEX FINGER (Left) - ANESTHESIA: IV REGIONAL/FAB RELEASE A-1 PULLEY LEFT RING FINGER (Left)  Patient Location: PACU  Anesthesia Type:Bier block  Level of Consciousness: awake, alert , oriented and patient cooperative  Airway & Oxygen Therapy: Patient Spontanous Breathing and Patient connected to face mask oxygen  Post-op Assessment: Report given to PACU RN and Post -op Vital signs reviewed and stable  Post vital signs: Reviewed and stable  Complications: No apparent anesthesia complications

## 2013-11-12 ENCOUNTER — Encounter (HOSPITAL_BASED_OUTPATIENT_CLINIC_OR_DEPARTMENT_OTHER): Payer: Self-pay | Admitting: Orthopedic Surgery

## 2013-11-12 NOTE — Op Note (Signed)
[name], April Munoz                 ACCOUNT NO.:  [id number]  MEDICAL RECORD NO.:  [id number]  LOCATION:                                 FACILITY:  PHYSICIAN:  April Munoz, M.D.            DATE OF BIRTH:  DATE OF PROCEDURE:  11/11/2013 DATE OF DISCHARGE:                              OPERATIVE REPORT   POSTOPERATIVE DIAGNOSES:  Mucoid tumor, left index finger, degenerative arthritis, distal interphalangeal joint, stenosing tenosynovitis, left ring finger.  POSTOPERATIVE DIAGNOSES:  Mucoid tumor, left index finger, degenerative arthritis, distal interphalangeal joint, stenosing tenosynovitis, left ring finger.  OPERATION:  Release A1 pulley, left ring finger with excision of flexor sheath cyst, excision of mucoid cyst with debridement distal interphalangeal joint, left index finger.  SURGEON:  April Brod, MD  ASSISTANT:  April Cover, MD  ANESTHESIA:  Forearm-based IV regional sedation with metacarpal block local infiltration.  ANESTHESIOLOGIST:  April Dach, MD  HISTORY:  The patient is a 63 year old female with a history of triggering of her left ring finger.  Mass present at the A1 pulley along with a mucoid cyst, and arthritis of the distal interphalangeal joint, left index finger.  The cyst has been opened in the past, was placed on antibiotics to include the possibility of infection.  She is admitted now for release of the A1 pulley of the left ring finger, excision of the mucoid tumor with debridement of the distal interphalangeal joint, left index finger.  She is aware of risks and complications including infection; recurrence of injury to arteries, nerves, tendons; incomplete relief of symptoms and dystrophy.  In preoperative area, the patient is seen, the extremity marked by both patient and surgeon, antibiotic given.  PROCEDURE IN DETAIL:  The patient was brought to the operating room, where a forearm-based IV regional anesthetic was carried out  without difficulty under the direction Dr. Orene Desanctis.  She was prepped using ChloraPrep, supine position with the left arm free.  A 3-minute dry time was allowed.  Time-out taken confirming the patient and procedure.  The ring finger was approached first.  An incision made on the volar aspect directly over the A1 pulley and carried down through subcutaneous tissue.  Retractors placed.  After identification of the A1 pulley protecting both radial and ulnar neurovascular bundles.  The A1 pulley was then released on its radial aspect.  A partial tenosynovectomy performed proximally.  A small incision made centrally in the A2 pulley. The finger placed through a full range of motion, no further triggering was noted.  The wound was irrigated and closed with interrupted 4-0 Vicryl Rapide sutures.  Separate incision was then made over the distal interphalangeal joint of the left index finger, carried down on the radial aspect mid lateral line, and carried down through subcutaneous tissue.  Bleeders were electrocauterized with bipolar.  The deflated cyst was then excised and sent to Pathology.  The joint was opened on its radial aspect and debridement of synovectomy performed with a small hemostatic type rongeur.  The wound was copiously irrigated with saline. The skin then closed with interrupted 4-0 Vicryl Rapide sutures.  A metacarpal block was  given to each of the fingers with an infiltration of the area of the A1 pulley on the ring finger.  Sterile compressive dressing was applied to each incision area along with a splint to the distal interphalangeal joint to the index.  On deflation of the tourniquet, remaining fingers pinked.  She was taken to the recovery room for observation in satisfactory condition.  Total of 9 mL of bupivacaine was used for anesthesia.  She tolerated the procedure well. She will be discharged home to return in 1 week on Norco.           ______________________________ April Munoz, M.D.     GK/MEDQ  D:  11/11/2013  T:  11/12/2013  Job:  [id number]

## 2014-05-06 DIAGNOSIS — E559 Vitamin D deficiency, unspecified: Secondary | ICD-10-CM | POA: Diagnosis not present

## 2014-05-06 DIAGNOSIS — E538 Deficiency of other specified B group vitamins: Secondary | ICD-10-CM | POA: Diagnosis not present

## 2014-05-06 DIAGNOSIS — E038 Other specified hypothyroidism: Secondary | ICD-10-CM | POA: Diagnosis not present

## 2014-05-06 DIAGNOSIS — E119 Type 2 diabetes mellitus without complications: Secondary | ICD-10-CM | POA: Diagnosis not present

## 2014-05-06 DIAGNOSIS — Z23 Encounter for immunization: Secondary | ICD-10-CM | POA: Diagnosis not present

## 2014-06-18 DIAGNOSIS — K2270 Barrett's esophagus without dysplasia: Secondary | ICD-10-CM | POA: Diagnosis not present

## 2014-06-18 DIAGNOSIS — K219 Gastro-esophageal reflux disease without esophagitis: Secondary | ICD-10-CM | POA: Diagnosis not present

## 2014-06-18 DIAGNOSIS — R1310 Dysphagia, unspecified: Secondary | ICD-10-CM | POA: Diagnosis not present

## 2014-06-18 DIAGNOSIS — K602 Anal fissure, unspecified: Secondary | ICD-10-CM | POA: Diagnosis not present

## 2014-07-03 DIAGNOSIS — K2270 Barrett's esophagus without dysplasia: Secondary | ICD-10-CM | POA: Diagnosis not present

## 2014-07-03 DIAGNOSIS — E079 Disorder of thyroid, unspecified: Secondary | ICD-10-CM | POA: Diagnosis not present

## 2014-07-03 DIAGNOSIS — Z79899 Other long term (current) drug therapy: Secondary | ICD-10-CM | POA: Diagnosis not present

## 2014-07-03 DIAGNOSIS — I10 Essential (primary) hypertension: Secondary | ICD-10-CM | POA: Diagnosis not present

## 2014-07-03 DIAGNOSIS — R1310 Dysphagia, unspecified: Secondary | ICD-10-CM | POA: Diagnosis not present

## 2014-07-03 DIAGNOSIS — K2210 Ulcer of esophagus without bleeding: Secondary | ICD-10-CM | POA: Diagnosis not present

## 2014-07-03 DIAGNOSIS — Z9884 Bariatric surgery status: Secondary | ICD-10-CM | POA: Diagnosis not present

## 2014-07-03 DIAGNOSIS — K6389 Other specified diseases of intestine: Secondary | ICD-10-CM | POA: Diagnosis not present

## 2014-07-03 DIAGNOSIS — K219 Gastro-esophageal reflux disease without esophagitis: Secondary | ICD-10-CM | POA: Diagnosis not present

## 2014-07-03 DIAGNOSIS — Z7982 Long term (current) use of aspirin: Secondary | ICD-10-CM | POA: Diagnosis not present

## 2014-07-03 DIAGNOSIS — E119 Type 2 diabetes mellitus without complications: Secondary | ICD-10-CM | POA: Diagnosis not present

## 2014-07-07 DIAGNOSIS — L821 Other seborrheic keratosis: Secondary | ICD-10-CM | POA: Diagnosis not present

## 2014-07-07 DIAGNOSIS — L739 Follicular disorder, unspecified: Secondary | ICD-10-CM | POA: Diagnosis not present

## 2014-07-07 DIAGNOSIS — L814 Other melanin hyperpigmentation: Secondary | ICD-10-CM | POA: Diagnosis not present

## 2014-08-11 DIAGNOSIS — E785 Hyperlipidemia, unspecified: Secondary | ICD-10-CM | POA: Diagnosis not present

## 2014-08-11 DIAGNOSIS — E038 Other specified hypothyroidism: Secondary | ICD-10-CM | POA: Diagnosis not present

## 2014-08-11 DIAGNOSIS — E538 Deficiency of other specified B group vitamins: Secondary | ICD-10-CM | POA: Diagnosis not present

## 2014-08-11 DIAGNOSIS — G4700 Insomnia, unspecified: Secondary | ICD-10-CM | POA: Diagnosis not present

## 2014-08-11 DIAGNOSIS — E119 Type 2 diabetes mellitus without complications: Secondary | ICD-10-CM | POA: Diagnosis not present

## 2014-08-11 DIAGNOSIS — G629 Polyneuropathy, unspecified: Secondary | ICD-10-CM | POA: Diagnosis not present

## 2014-08-11 DIAGNOSIS — Z6834 Body mass index (BMI) 34.0-34.9, adult: Secondary | ICD-10-CM | POA: Diagnosis not present

## 2014-08-11 DIAGNOSIS — I10 Essential (primary) hypertension: Secondary | ICD-10-CM | POA: Diagnosis not present

## 2014-08-11 DIAGNOSIS — F329 Major depressive disorder, single episode, unspecified: Secondary | ICD-10-CM | POA: Diagnosis not present

## 2014-08-11 DIAGNOSIS — R635 Abnormal weight gain: Secondary | ICD-10-CM | POA: Diagnosis not present

## 2014-08-11 DIAGNOSIS — E559 Vitamin D deficiency, unspecified: Secondary | ICD-10-CM | POA: Diagnosis not present

## 2014-08-12 DIAGNOSIS — K602 Anal fissure, unspecified: Secondary | ICD-10-CM | POA: Diagnosis not present

## 2014-08-12 DIAGNOSIS — K589 Irritable bowel syndrome without diarrhea: Secondary | ICD-10-CM | POA: Diagnosis not present

## 2014-08-12 DIAGNOSIS — T182XXA Foreign body in stomach, initial encounter: Secondary | ICD-10-CM | POA: Diagnosis not present

## 2014-08-14 DIAGNOSIS — Z1231 Encounter for screening mammogram for malignant neoplasm of breast: Secondary | ICD-10-CM | POA: Diagnosis not present

## 2014-08-19 DIAGNOSIS — E1142 Type 2 diabetes mellitus with diabetic polyneuropathy: Secondary | ICD-10-CM | POA: Diagnosis not present

## 2014-08-19 DIAGNOSIS — M2010 Hallux valgus (acquired), unspecified foot: Secondary | ICD-10-CM | POA: Diagnosis not present

## 2014-10-30 DIAGNOSIS — E663 Overweight: Secondary | ICD-10-CM | POA: Diagnosis not present

## 2014-10-30 DIAGNOSIS — E8881 Metabolic syndrome: Secondary | ICD-10-CM | POA: Diagnosis not present

## 2014-11-11 DIAGNOSIS — Z6833 Body mass index (BMI) 33.0-33.9, adult: Secondary | ICD-10-CM | POA: Diagnosis not present

## 2014-11-11 DIAGNOSIS — E559 Vitamin D deficiency, unspecified: Secondary | ICD-10-CM | POA: Diagnosis not present

## 2014-11-11 DIAGNOSIS — E119 Type 2 diabetes mellitus without complications: Secondary | ICD-10-CM | POA: Diagnosis not present

## 2014-11-11 DIAGNOSIS — E785 Hyperlipidemia, unspecified: Secondary | ICD-10-CM | POA: Diagnosis not present

## 2014-11-11 DIAGNOSIS — E1140 Type 2 diabetes mellitus with diabetic neuropathy, unspecified: Secondary | ICD-10-CM | POA: Diagnosis not present

## 2014-11-11 DIAGNOSIS — J301 Allergic rhinitis due to pollen: Secondary | ICD-10-CM | POA: Diagnosis not present

## 2014-11-11 DIAGNOSIS — E538 Deficiency of other specified B group vitamins: Secondary | ICD-10-CM | POA: Diagnosis not present

## 2014-11-11 DIAGNOSIS — K210 Gastro-esophageal reflux disease with esophagitis: Secondary | ICD-10-CM | POA: Diagnosis not present

## 2014-11-11 DIAGNOSIS — M818 Other osteoporosis without current pathological fracture: Secondary | ICD-10-CM | POA: Diagnosis not present

## 2014-11-11 DIAGNOSIS — I10 Essential (primary) hypertension: Secondary | ICD-10-CM | POA: Diagnosis not present

## 2014-11-11 DIAGNOSIS — T782XXA Anaphylactic shock, unspecified, initial encounter: Secondary | ICD-10-CM | POA: Diagnosis not present

## 2014-11-11 DIAGNOSIS — E038 Other specified hypothyroidism: Secondary | ICD-10-CM | POA: Diagnosis not present

## 2014-11-11 DIAGNOSIS — F329 Major depressive disorder, single episode, unspecified: Secondary | ICD-10-CM | POA: Diagnosis not present

## 2014-11-30 DIAGNOSIS — G629 Polyneuropathy, unspecified: Secondary | ICD-10-CM | POA: Diagnosis not present

## 2014-11-30 DIAGNOSIS — G2581 Restless legs syndrome: Secondary | ICD-10-CM | POA: Diagnosis not present

## 2014-11-30 DIAGNOSIS — R413 Other amnesia: Secondary | ICD-10-CM | POA: Diagnosis not present

## 2014-12-03 DIAGNOSIS — E8881 Metabolic syndrome: Secondary | ICD-10-CM | POA: Diagnosis not present

## 2014-12-16 DIAGNOSIS — J0100 Acute maxillary sinusitis, unspecified: Secondary | ICD-10-CM | POA: Diagnosis not present

## 2014-12-31 DIAGNOSIS — E8881 Metabolic syndrome: Secondary | ICD-10-CM | POA: Diagnosis not present

## 2014-12-31 DIAGNOSIS — E663 Overweight: Secondary | ICD-10-CM | POA: Diagnosis not present

## 2015-02-10 DIAGNOSIS — M818 Other osteoporosis without current pathological fracture: Secondary | ICD-10-CM | POA: Diagnosis not present

## 2015-02-10 DIAGNOSIS — I10 Essential (primary) hypertension: Secondary | ICD-10-CM | POA: Diagnosis not present

## 2015-02-10 DIAGNOSIS — E785 Hyperlipidemia, unspecified: Secondary | ICD-10-CM | POA: Diagnosis not present

## 2015-02-10 DIAGNOSIS — E119 Type 2 diabetes mellitus without complications: Secondary | ICD-10-CM | POA: Diagnosis not present

## 2015-02-10 DIAGNOSIS — Z9181 History of falling: Secondary | ICD-10-CM | POA: Diagnosis not present

## 2015-02-10 DIAGNOSIS — K210 Gastro-esophageal reflux disease with esophagitis: Secondary | ICD-10-CM | POA: Diagnosis not present

## 2015-02-10 DIAGNOSIS — E038 Other specified hypothyroidism: Secondary | ICD-10-CM | POA: Diagnosis not present

## 2015-02-10 DIAGNOSIS — F329 Major depressive disorder, single episode, unspecified: Secondary | ICD-10-CM | POA: Diagnosis not present

## 2015-02-10 DIAGNOSIS — M25473 Effusion, unspecified ankle: Secondary | ICD-10-CM | POA: Diagnosis not present

## 2015-02-10 DIAGNOSIS — E1140 Type 2 diabetes mellitus with diabetic neuropathy, unspecified: Secondary | ICD-10-CM | POA: Diagnosis not present

## 2015-02-10 DIAGNOSIS — E538 Deficiency of other specified B group vitamins: Secondary | ICD-10-CM | POA: Diagnosis not present

## 2015-02-10 DIAGNOSIS — Z6831 Body mass index (BMI) 31.0-31.9, adult: Secondary | ICD-10-CM | POA: Diagnosis not present

## 2015-03-17 DIAGNOSIS — J0100 Acute maxillary sinusitis, unspecified: Secondary | ICD-10-CM | POA: Diagnosis not present

## 2015-04-15 DIAGNOSIS — E8881 Metabolic syndrome: Secondary | ICD-10-CM | POA: Diagnosis not present

## 2015-04-15 DIAGNOSIS — E663 Overweight: Secondary | ICD-10-CM | POA: Diagnosis not present

## 2015-05-14 DIAGNOSIS — T782XXA Anaphylactic shock, unspecified, initial encounter: Secondary | ICD-10-CM | POA: Diagnosis not present

## 2015-05-14 DIAGNOSIS — E119 Type 2 diabetes mellitus without complications: Secondary | ICD-10-CM | POA: Diagnosis not present

## 2015-05-14 DIAGNOSIS — E1140 Type 2 diabetes mellitus with diabetic neuropathy, unspecified: Secondary | ICD-10-CM | POA: Diagnosis not present

## 2015-05-14 DIAGNOSIS — M25473 Effusion, unspecified ankle: Secondary | ICD-10-CM | POA: Diagnosis not present

## 2015-05-14 DIAGNOSIS — E559 Vitamin D deficiency, unspecified: Secondary | ICD-10-CM | POA: Diagnosis not present

## 2015-05-14 DIAGNOSIS — I10 Essential (primary) hypertension: Secondary | ICD-10-CM | POA: Diagnosis not present

## 2015-05-14 DIAGNOSIS — F329 Major depressive disorder, single episode, unspecified: Secondary | ICD-10-CM | POA: Diagnosis not present

## 2015-05-14 DIAGNOSIS — K210 Gastro-esophageal reflux disease with esophagitis: Secondary | ICD-10-CM | POA: Diagnosis not present

## 2015-05-14 DIAGNOSIS — E785 Hyperlipidemia, unspecified: Secondary | ICD-10-CM | POA: Diagnosis not present

## 2015-05-14 DIAGNOSIS — Z23 Encounter for immunization: Secondary | ICD-10-CM | POA: Diagnosis not present

## 2015-05-14 DIAGNOSIS — E538 Deficiency of other specified B group vitamins: Secondary | ICD-10-CM | POA: Diagnosis not present

## 2015-05-14 DIAGNOSIS — M818 Other osteoporosis without current pathological fracture: Secondary | ICD-10-CM | POA: Diagnosis not present

## 2015-05-27 DIAGNOSIS — E8881 Metabolic syndrome: Secondary | ICD-10-CM | POA: Diagnosis not present

## 2015-05-27 DIAGNOSIS — E663 Overweight: Secondary | ICD-10-CM | POA: Diagnosis not present

## 2015-06-24 DIAGNOSIS — E663 Overweight: Secondary | ICD-10-CM | POA: Diagnosis not present

## 2015-06-24 DIAGNOSIS — E8881 Metabolic syndrome: Secondary | ICD-10-CM | POA: Diagnosis not present

## 2015-07-15 DIAGNOSIS — K219 Gastro-esophageal reflux disease without esophagitis: Secondary | ICD-10-CM | POA: Diagnosis not present

## 2015-07-15 DIAGNOSIS — K589 Irritable bowel syndrome without diarrhea: Secondary | ICD-10-CM | POA: Diagnosis not present

## 2015-07-15 DIAGNOSIS — K602 Anal fissure, unspecified: Secondary | ICD-10-CM | POA: Diagnosis not present

## 2015-07-15 DIAGNOSIS — R1310 Dysphagia, unspecified: Secondary | ICD-10-CM | POA: Diagnosis not present

## 2015-07-23 ENCOUNTER — Other Ambulatory Visit: Payer: Self-pay

## 2015-07-23 DIAGNOSIS — E119 Type 2 diabetes mellitus without complications: Secondary | ICD-10-CM | POA: Diagnosis not present

## 2015-07-23 DIAGNOSIS — R110 Nausea: Secondary | ICD-10-CM | POA: Diagnosis not present

## 2015-07-23 DIAGNOSIS — K2270 Barrett's esophagus without dysplasia: Secondary | ICD-10-CM | POA: Diagnosis not present

## 2015-07-23 DIAGNOSIS — Z79899 Other long term (current) drug therapy: Secondary | ICD-10-CM | POA: Diagnosis not present

## 2015-07-23 DIAGNOSIS — Z7984 Long term (current) use of oral hypoglycemic drugs: Secondary | ICD-10-CM | POA: Diagnosis not present

## 2015-07-23 DIAGNOSIS — K219 Gastro-esophageal reflux disease without esophagitis: Secondary | ICD-10-CM | POA: Diagnosis not present

## 2015-07-23 DIAGNOSIS — E039 Hypothyroidism, unspecified: Secondary | ICD-10-CM | POA: Diagnosis not present

## 2015-07-23 DIAGNOSIS — K222 Esophageal obstruction: Secondary | ICD-10-CM | POA: Diagnosis not present

## 2015-07-23 DIAGNOSIS — Z9049 Acquired absence of other specified parts of digestive tract: Secondary | ICD-10-CM | POA: Diagnosis not present

## 2015-07-23 DIAGNOSIS — R1310 Dysphagia, unspecified: Secondary | ICD-10-CM | POA: Diagnosis not present

## 2015-07-23 DIAGNOSIS — I10 Essential (primary) hypertension: Secondary | ICD-10-CM | POA: Diagnosis not present

## 2015-07-23 DIAGNOSIS — Z9884 Bariatric surgery status: Secondary | ICD-10-CM | POA: Diagnosis not present

## 2015-08-15 DIAGNOSIS — I480 Paroxysmal atrial fibrillation: Secondary | ICD-10-CM

## 2015-08-15 DIAGNOSIS — R011 Cardiac murmur, unspecified: Secondary | ICD-10-CM

## 2015-08-15 HISTORY — DX: Cardiac murmur, unspecified: R01.1

## 2015-08-15 HISTORY — DX: Paroxysmal atrial fibrillation: I48.0

## 2015-08-16 DIAGNOSIS — I481 Persistent atrial fibrillation: Secondary | ICD-10-CM | POA: Diagnosis not present

## 2015-08-16 DIAGNOSIS — R0602 Shortness of breath: Secondary | ICD-10-CM | POA: Diagnosis not present

## 2015-08-23 DIAGNOSIS — K219 Gastro-esophageal reflux disease without esophagitis: Secondary | ICD-10-CM | POA: Diagnosis not present

## 2015-08-23 DIAGNOSIS — R1310 Dysphagia, unspecified: Secondary | ICD-10-CM | POA: Diagnosis not present

## 2015-08-25 DIAGNOSIS — I480 Paroxysmal atrial fibrillation: Secondary | ICD-10-CM | POA: Diagnosis not present

## 2015-08-25 DIAGNOSIS — I481 Persistent atrial fibrillation: Secondary | ICD-10-CM | POA: Diagnosis not present

## 2015-09-29 DIAGNOSIS — I481 Persistent atrial fibrillation: Secondary | ICD-10-CM | POA: Diagnosis not present

## 2015-09-29 DIAGNOSIS — I10 Essential (primary) hypertension: Secondary | ICD-10-CM | POA: Diagnosis not present

## 2015-11-10 DIAGNOSIS — M1712 Unilateral primary osteoarthritis, left knee: Secondary | ICD-10-CM | POA: Diagnosis not present

## 2015-12-29 DIAGNOSIS — M1712 Unilateral primary osteoarthritis, left knee: Secondary | ICD-10-CM | POA: Diagnosis not present

## 2016-01-05 DIAGNOSIS — I10 Essential (primary) hypertension: Secondary | ICD-10-CM | POA: Diagnosis not present

## 2016-01-05 DIAGNOSIS — E785 Hyperlipidemia, unspecified: Secondary | ICD-10-CM | POA: Diagnosis not present

## 2016-01-05 DIAGNOSIS — I480 Paroxysmal atrial fibrillation: Secondary | ICD-10-CM | POA: Diagnosis not present

## 2016-01-05 DIAGNOSIS — M1712 Unilateral primary osteoarthritis, left knee: Secondary | ICD-10-CM | POA: Diagnosis not present

## 2016-01-12 DIAGNOSIS — M1712 Unilateral primary osteoarthritis, left knee: Secondary | ICD-10-CM | POA: Diagnosis not present

## 2016-01-19 DIAGNOSIS — M1712 Unilateral primary osteoarthritis, left knee: Secondary | ICD-10-CM | POA: Diagnosis not present

## 2016-02-02 DIAGNOSIS — J309 Allergic rhinitis, unspecified: Secondary | ICD-10-CM | POA: Diagnosis not present

## 2016-03-01 DIAGNOSIS — M1712 Unilateral primary osteoarthritis, left knee: Secondary | ICD-10-CM | POA: Diagnosis not present

## 2016-03-02 DIAGNOSIS — R1032 Left lower quadrant pain: Secondary | ICD-10-CM | POA: Diagnosis not present

## 2016-03-02 DIAGNOSIS — I10 Essential (primary) hypertension: Secondary | ICD-10-CM | POA: Diagnosis not present

## 2016-03-08 DIAGNOSIS — R1032 Left lower quadrant pain: Secondary | ICD-10-CM | POA: Diagnosis not present

## 2016-03-08 DIAGNOSIS — R935 Abnormal findings on diagnostic imaging of other abdominal regions, including retroperitoneum: Secondary | ICD-10-CM | POA: Diagnosis not present

## 2016-03-08 DIAGNOSIS — K5730 Diverticulosis of large intestine without perforation or abscess without bleeding: Secondary | ICD-10-CM | POA: Diagnosis not present

## 2016-03-08 DIAGNOSIS — K449 Diaphragmatic hernia without obstruction or gangrene: Secondary | ICD-10-CM | POA: Diagnosis not present

## 2016-03-14 DIAGNOSIS — E559 Vitamin D deficiency, unspecified: Secondary | ICD-10-CM | POA: Diagnosis not present

## 2016-03-14 DIAGNOSIS — Z6832 Body mass index (BMI) 32.0-32.9, adult: Secondary | ICD-10-CM | POA: Diagnosis not present

## 2016-03-14 DIAGNOSIS — Z9181 History of falling: Secondary | ICD-10-CM | POA: Diagnosis not present

## 2016-03-14 DIAGNOSIS — E1140 Type 2 diabetes mellitus with diabetic neuropathy, unspecified: Secondary | ICD-10-CM | POA: Diagnosis not present

## 2016-03-14 DIAGNOSIS — E119 Type 2 diabetes mellitus without complications: Secondary | ICD-10-CM | POA: Diagnosis not present

## 2016-03-14 DIAGNOSIS — E538 Deficiency of other specified B group vitamins: Secondary | ICD-10-CM | POA: Diagnosis not present

## 2016-03-14 DIAGNOSIS — K210 Gastro-esophageal reflux disease with esophagitis: Secondary | ICD-10-CM | POA: Diagnosis not present

## 2016-03-14 DIAGNOSIS — E038 Other specified hypothyroidism: Secondary | ICD-10-CM | POA: Diagnosis not present

## 2016-03-14 DIAGNOSIS — Z1389 Encounter for screening for other disorder: Secondary | ICD-10-CM | POA: Diagnosis not present

## 2016-03-14 DIAGNOSIS — E785 Hyperlipidemia, unspecified: Secondary | ICD-10-CM | POA: Diagnosis not present

## 2016-03-14 DIAGNOSIS — E669 Obesity, unspecified: Secondary | ICD-10-CM | POA: Diagnosis not present

## 2016-03-14 DIAGNOSIS — F329 Major depressive disorder, single episode, unspecified: Secondary | ICD-10-CM | POA: Diagnosis not present

## 2016-03-31 DIAGNOSIS — M85861 Other specified disorders of bone density and structure, right lower leg: Secondary | ICD-10-CM | POA: Diagnosis not present

## 2016-03-31 DIAGNOSIS — Z1231 Encounter for screening mammogram for malignant neoplasm of breast: Secondary | ICD-10-CM | POA: Diagnosis not present

## 2016-03-31 DIAGNOSIS — Z780 Asymptomatic menopausal state: Secondary | ICD-10-CM | POA: Diagnosis not present

## 2016-03-31 DIAGNOSIS — M8580 Other specified disorders of bone density and structure, unspecified site: Secondary | ICD-10-CM | POA: Diagnosis not present

## 2016-05-02 DIAGNOSIS — Z23 Encounter for immunization: Secondary | ICD-10-CM | POA: Diagnosis not present

## 2016-05-02 DIAGNOSIS — F329 Major depressive disorder, single episode, unspecified: Secondary | ICD-10-CM | POA: Diagnosis not present

## 2016-05-02 DIAGNOSIS — H1130 Conjunctival hemorrhage, unspecified eye: Secondary | ICD-10-CM | POA: Diagnosis not present

## 2016-05-02 DIAGNOSIS — Z6832 Body mass index (BMI) 32.0-32.9, adult: Secondary | ICD-10-CM | POA: Diagnosis not present

## 2016-05-02 DIAGNOSIS — R791 Abnormal coagulation profile: Secondary | ICD-10-CM | POA: Diagnosis not present

## 2016-05-23 DIAGNOSIS — G629 Polyneuropathy, unspecified: Secondary | ICD-10-CM | POA: Diagnosis not present

## 2016-05-23 DIAGNOSIS — K210 Gastro-esophageal reflux disease with esophagitis: Secondary | ICD-10-CM | POA: Diagnosis not present

## 2016-05-23 DIAGNOSIS — F329 Major depressive disorder, single episode, unspecified: Secondary | ICD-10-CM | POA: Diagnosis not present

## 2016-05-23 DIAGNOSIS — B360 Pityriasis versicolor: Secondary | ICD-10-CM | POA: Diagnosis not present

## 2016-05-23 DIAGNOSIS — M818 Other osteoporosis without current pathological fracture: Secondary | ICD-10-CM | POA: Diagnosis not present

## 2016-05-23 DIAGNOSIS — E038 Other specified hypothyroidism: Secondary | ICD-10-CM | POA: Diagnosis not present

## 2016-05-23 DIAGNOSIS — E119 Type 2 diabetes mellitus without complications: Secondary | ICD-10-CM | POA: Diagnosis not present

## 2016-05-23 DIAGNOSIS — I50810 Right heart failure, unspecified: Secondary | ICD-10-CM | POA: Diagnosis not present

## 2016-05-23 DIAGNOSIS — E785 Hyperlipidemia, unspecified: Secondary | ICD-10-CM | POA: Diagnosis not present

## 2016-05-23 DIAGNOSIS — E538 Deficiency of other specified B group vitamins: Secondary | ICD-10-CM | POA: Diagnosis not present

## 2016-05-23 DIAGNOSIS — L821 Other seborrheic keratosis: Secondary | ICD-10-CM | POA: Diagnosis not present

## 2016-05-23 DIAGNOSIS — I10 Essential (primary) hypertension: Secondary | ICD-10-CM | POA: Diagnosis not present

## 2016-05-23 DIAGNOSIS — G4700 Insomnia, unspecified: Secondary | ICD-10-CM | POA: Diagnosis not present

## 2016-05-23 DIAGNOSIS — L820 Inflamed seborrheic keratosis: Secondary | ICD-10-CM | POA: Diagnosis not present

## 2016-05-30 DIAGNOSIS — M9901 Segmental and somatic dysfunction of cervical region: Secondary | ICD-10-CM | POA: Diagnosis not present

## 2016-05-30 DIAGNOSIS — S336XXA Sprain of sacroiliac joint, initial encounter: Secondary | ICD-10-CM | POA: Diagnosis not present

## 2016-05-30 DIAGNOSIS — S335XXA Sprain of ligaments of lumbar spine, initial encounter: Secondary | ICD-10-CM | POA: Diagnosis not present

## 2016-05-30 DIAGNOSIS — M9903 Segmental and somatic dysfunction of lumbar region: Secondary | ICD-10-CM | POA: Diagnosis not present

## 2016-05-30 DIAGNOSIS — M9902 Segmental and somatic dysfunction of thoracic region: Secondary | ICD-10-CM | POA: Diagnosis not present

## 2016-05-31 DIAGNOSIS — S336XXA Sprain of sacroiliac joint, initial encounter: Secondary | ICD-10-CM | POA: Diagnosis not present

## 2016-05-31 DIAGNOSIS — M9902 Segmental and somatic dysfunction of thoracic region: Secondary | ICD-10-CM | POA: Diagnosis not present

## 2016-05-31 DIAGNOSIS — S335XXA Sprain of ligaments of lumbar spine, initial encounter: Secondary | ICD-10-CM | POA: Diagnosis not present

## 2016-05-31 DIAGNOSIS — M9901 Segmental and somatic dysfunction of cervical region: Secondary | ICD-10-CM | POA: Diagnosis not present

## 2016-05-31 DIAGNOSIS — M9903 Segmental and somatic dysfunction of lumbar region: Secondary | ICD-10-CM | POA: Diagnosis not present

## 2016-06-01 DIAGNOSIS — S336XXA Sprain of sacroiliac joint, initial encounter: Secondary | ICD-10-CM | POA: Diagnosis not present

## 2016-06-01 DIAGNOSIS — S335XXA Sprain of ligaments of lumbar spine, initial encounter: Secondary | ICD-10-CM | POA: Diagnosis not present

## 2016-06-01 DIAGNOSIS — M9901 Segmental and somatic dysfunction of cervical region: Secondary | ICD-10-CM | POA: Diagnosis not present

## 2016-06-01 DIAGNOSIS — M9903 Segmental and somatic dysfunction of lumbar region: Secondary | ICD-10-CM | POA: Diagnosis not present

## 2016-06-01 DIAGNOSIS — M9902 Segmental and somatic dysfunction of thoracic region: Secondary | ICD-10-CM | POA: Diagnosis not present

## 2016-06-05 DIAGNOSIS — S335XXA Sprain of ligaments of lumbar spine, initial encounter: Secondary | ICD-10-CM | POA: Diagnosis not present

## 2016-06-05 DIAGNOSIS — M9903 Segmental and somatic dysfunction of lumbar region: Secondary | ICD-10-CM | POA: Diagnosis not present

## 2016-06-05 DIAGNOSIS — M9901 Segmental and somatic dysfunction of cervical region: Secondary | ICD-10-CM | POA: Diagnosis not present

## 2016-06-05 DIAGNOSIS — S336XXA Sprain of sacroiliac joint, initial encounter: Secondary | ICD-10-CM | POA: Diagnosis not present

## 2016-06-05 DIAGNOSIS — M9902 Segmental and somatic dysfunction of thoracic region: Secondary | ICD-10-CM | POA: Diagnosis not present

## 2016-06-06 DIAGNOSIS — S336XXA Sprain of sacroiliac joint, initial encounter: Secondary | ICD-10-CM | POA: Diagnosis not present

## 2016-06-06 DIAGNOSIS — S335XXA Sprain of ligaments of lumbar spine, initial encounter: Secondary | ICD-10-CM | POA: Diagnosis not present

## 2016-06-06 DIAGNOSIS — M9901 Segmental and somatic dysfunction of cervical region: Secondary | ICD-10-CM | POA: Diagnosis not present

## 2016-06-06 DIAGNOSIS — M9902 Segmental and somatic dysfunction of thoracic region: Secondary | ICD-10-CM | POA: Diagnosis not present

## 2016-06-06 DIAGNOSIS — M9903 Segmental and somatic dysfunction of lumbar region: Secondary | ICD-10-CM | POA: Diagnosis not present

## 2016-06-12 DIAGNOSIS — S336XXA Sprain of sacroiliac joint, initial encounter: Secondary | ICD-10-CM | POA: Diagnosis not present

## 2016-06-12 DIAGNOSIS — S335XXA Sprain of ligaments of lumbar spine, initial encounter: Secondary | ICD-10-CM | POA: Diagnosis not present

## 2016-06-12 DIAGNOSIS — M9902 Segmental and somatic dysfunction of thoracic region: Secondary | ICD-10-CM | POA: Diagnosis not present

## 2016-06-12 DIAGNOSIS — M9903 Segmental and somatic dysfunction of lumbar region: Secondary | ICD-10-CM | POA: Diagnosis not present

## 2016-06-12 DIAGNOSIS — M9901 Segmental and somatic dysfunction of cervical region: Secondary | ICD-10-CM | POA: Diagnosis not present

## 2016-06-13 DIAGNOSIS — K4030 Unilateral inguinal hernia, with obstruction, without gangrene, not specified as recurrent: Secondary | ICD-10-CM | POA: Diagnosis not present

## 2016-06-13 DIAGNOSIS — R1031 Right lower quadrant pain: Secondary | ICD-10-CM | POA: Diagnosis not present

## 2016-06-13 DIAGNOSIS — I10 Essential (primary) hypertension: Secondary | ICD-10-CM | POA: Diagnosis not present

## 2016-06-13 DIAGNOSIS — R1032 Left lower quadrant pain: Secondary | ICD-10-CM | POA: Diagnosis not present

## 2016-06-14 DIAGNOSIS — M9902 Segmental and somatic dysfunction of thoracic region: Secondary | ICD-10-CM | POA: Diagnosis not present

## 2016-06-14 DIAGNOSIS — S335XXA Sprain of ligaments of lumbar spine, initial encounter: Secondary | ICD-10-CM | POA: Diagnosis not present

## 2016-06-14 DIAGNOSIS — M9903 Segmental and somatic dysfunction of lumbar region: Secondary | ICD-10-CM | POA: Diagnosis not present

## 2016-06-14 DIAGNOSIS — S336XXA Sprain of sacroiliac joint, initial encounter: Secondary | ICD-10-CM | POA: Diagnosis not present

## 2016-06-14 DIAGNOSIS — M9901 Segmental and somatic dysfunction of cervical region: Secondary | ICD-10-CM | POA: Diagnosis not present

## 2016-06-23 DIAGNOSIS — D1779 Benign lipomatous neoplasm of other sites: Secondary | ICD-10-CM | POA: Diagnosis not present

## 2016-06-23 DIAGNOSIS — K4030 Unilateral inguinal hernia, with obstruction, without gangrene, not specified as recurrent: Secondary | ICD-10-CM | POA: Diagnosis not present

## 2016-06-23 DIAGNOSIS — D171 Benign lipomatous neoplasm of skin and subcutaneous tissue of trunk: Secondary | ICD-10-CM | POA: Diagnosis not present

## 2016-07-07 DIAGNOSIS — L03311 Cellulitis of abdominal wall: Secondary | ICD-10-CM | POA: Diagnosis not present

## 2016-07-07 DIAGNOSIS — M797 Fibromyalgia: Secondary | ICD-10-CM | POA: Diagnosis present

## 2016-07-07 DIAGNOSIS — Z7982 Long term (current) use of aspirin: Secondary | ICD-10-CM | POA: Diagnosis not present

## 2016-07-07 DIAGNOSIS — E875 Hyperkalemia: Secondary | ICD-10-CM | POA: Diagnosis not present

## 2016-07-07 DIAGNOSIS — S301XXA Contusion of abdominal wall, initial encounter: Secondary | ICD-10-CM | POA: Diagnosis not present

## 2016-07-07 DIAGNOSIS — B9561 Methicillin susceptible Staphylococcus aureus infection as the cause of diseases classified elsewhere: Secondary | ICD-10-CM | POA: Diagnosis present

## 2016-07-07 DIAGNOSIS — I10 Essential (primary) hypertension: Secondary | ICD-10-CM | POA: Diagnosis not present

## 2016-07-07 DIAGNOSIS — I4891 Unspecified atrial fibrillation: Secondary | ICD-10-CM | POA: Diagnosis present

## 2016-07-07 DIAGNOSIS — Z79899 Other long term (current) drug therapy: Secondary | ICD-10-CM | POA: Diagnosis not present

## 2016-07-07 DIAGNOSIS — E538 Deficiency of other specified B group vitamins: Secondary | ICD-10-CM | POA: Diagnosis present

## 2016-07-07 DIAGNOSIS — Z7984 Long term (current) use of oral hypoglycemic drugs: Secondary | ICD-10-CM | POA: Diagnosis not present

## 2016-07-07 DIAGNOSIS — L02211 Cutaneous abscess of abdominal wall: Secondary | ICD-10-CM | POA: Diagnosis not present

## 2016-07-07 DIAGNOSIS — T814XXA Infection following a procedure, initial encounter: Secondary | ICD-10-CM | POA: Diagnosis not present

## 2016-07-07 DIAGNOSIS — E119 Type 2 diabetes mellitus without complications: Secondary | ICD-10-CM | POA: Diagnosis not present

## 2016-07-07 DIAGNOSIS — G2581 Restless legs syndrome: Secondary | ICD-10-CM | POA: Diagnosis present

## 2016-07-07 DIAGNOSIS — N390 Urinary tract infection, site not specified: Secondary | ICD-10-CM | POA: Diagnosis not present

## 2016-07-07 DIAGNOSIS — L7633 Postprocedural seroma of skin and subcutaneous tissue following a dermatologic procedure: Secondary | ICD-10-CM | POA: Diagnosis not present

## 2016-07-07 DIAGNOSIS — E1140 Type 2 diabetes mellitus with diabetic neuropathy, unspecified: Secondary | ICD-10-CM | POA: Diagnosis present

## 2016-07-07 DIAGNOSIS — F329 Major depressive disorder, single episode, unspecified: Secondary | ICD-10-CM | POA: Diagnosis not present

## 2016-07-07 DIAGNOSIS — Z8249 Family history of ischemic heart disease and other diseases of the circulatory system: Secondary | ICD-10-CM | POA: Diagnosis not present

## 2016-07-07 DIAGNOSIS — E785 Hyperlipidemia, unspecified: Secondary | ICD-10-CM | POA: Diagnosis present

## 2016-07-07 DIAGNOSIS — Z7983 Long term (current) use of bisphosphonates: Secondary | ICD-10-CM | POA: Diagnosis not present

## 2016-07-07 HISTORY — DX: Cellulitis of abdominal wall: L03.311

## 2016-07-13 DIAGNOSIS — Z9181 History of falling: Secondary | ICD-10-CM | POA: Diagnosis not present

## 2016-07-13 DIAGNOSIS — Z7984 Long term (current) use of oral hypoglycemic drugs: Secondary | ICD-10-CM | POA: Diagnosis not present

## 2016-07-13 DIAGNOSIS — I4891 Unspecified atrial fibrillation: Secondary | ICD-10-CM | POA: Diagnosis not present

## 2016-07-13 DIAGNOSIS — T814XXA Infection following a procedure, initial encounter: Secondary | ICD-10-CM | POA: Diagnosis not present

## 2016-07-13 DIAGNOSIS — L03311 Cellulitis of abdominal wall: Secondary | ICD-10-CM | POA: Diagnosis not present

## 2016-07-13 DIAGNOSIS — E1140 Type 2 diabetes mellitus with diabetic neuropathy, unspecified: Secondary | ICD-10-CM | POA: Diagnosis not present

## 2016-07-13 DIAGNOSIS — G2581 Restless legs syndrome: Secondary | ICD-10-CM | POA: Diagnosis not present

## 2016-07-13 DIAGNOSIS — Z792 Long term (current) use of antibiotics: Secondary | ICD-10-CM | POA: Diagnosis not present

## 2016-07-13 DIAGNOSIS — M797 Fibromyalgia: Secondary | ICD-10-CM | POA: Diagnosis not present

## 2016-07-13 DIAGNOSIS — E538 Deficiency of other specified B group vitamins: Secondary | ICD-10-CM | POA: Diagnosis not present

## 2016-07-13 DIAGNOSIS — I10 Essential (primary) hypertension: Secondary | ICD-10-CM | POA: Diagnosis not present

## 2016-07-13 DIAGNOSIS — F329 Major depressive disorder, single episode, unspecified: Secondary | ICD-10-CM | POA: Diagnosis not present

## 2016-07-13 DIAGNOSIS — E785 Hyperlipidemia, unspecified: Secondary | ICD-10-CM | POA: Diagnosis not present

## 2016-07-13 DIAGNOSIS — B9562 Methicillin resistant Staphylococcus aureus infection as the cause of diseases classified elsewhere: Secondary | ICD-10-CM | POA: Diagnosis not present

## 2016-07-13 DIAGNOSIS — K219 Gastro-esophageal reflux disease without esophagitis: Secondary | ICD-10-CM | POA: Diagnosis not present

## 2016-07-18 DIAGNOSIS — T888XXA Other specified complications of surgical and medical care, not elsewhere classified, initial encounter: Secondary | ICD-10-CM | POA: Diagnosis not present

## 2016-07-20 DIAGNOSIS — M1712 Unilateral primary osteoarthritis, left knee: Secondary | ICD-10-CM | POA: Diagnosis not present

## 2016-07-20 DIAGNOSIS — M25561 Pain in right knee: Secondary | ICD-10-CM | POA: Diagnosis not present

## 2016-07-25 DIAGNOSIS — T888XXA Other specified complications of surgical and medical care, not elsewhere classified, initial encounter: Secondary | ICD-10-CM | POA: Diagnosis not present

## 2016-08-08 DIAGNOSIS — T888XXA Other specified complications of surgical and medical care, not elsewhere classified, initial encounter: Secondary | ICD-10-CM | POA: Diagnosis not present

## 2016-08-09 DIAGNOSIS — M1712 Unilateral primary osteoarthritis, left knee: Secondary | ICD-10-CM | POA: Diagnosis not present

## 2016-08-16 DIAGNOSIS — M1712 Unilateral primary osteoarthritis, left knee: Secondary | ICD-10-CM | POA: Diagnosis not present

## 2016-08-23 DIAGNOSIS — M1712 Unilateral primary osteoarthritis, left knee: Secondary | ICD-10-CM | POA: Diagnosis not present

## 2016-09-05 DIAGNOSIS — S31101A Unspecified open wound of abdominal wall, left upper quadrant without penetration into peritoneal cavity, initial encounter: Secondary | ICD-10-CM | POA: Diagnosis not present

## 2016-09-14 DIAGNOSIS — R002 Palpitations: Secondary | ICD-10-CM | POA: Diagnosis not present

## 2016-09-14 DIAGNOSIS — I480 Paroxysmal atrial fibrillation: Secondary | ICD-10-CM | POA: Diagnosis not present

## 2016-09-14 DIAGNOSIS — E785 Hyperlipidemia, unspecified: Secondary | ICD-10-CM | POA: Diagnosis not present

## 2016-09-14 DIAGNOSIS — I10 Essential (primary) hypertension: Secondary | ICD-10-CM | POA: Diagnosis not present

## 2016-09-15 DIAGNOSIS — R509 Fever, unspecified: Secondary | ICD-10-CM | POA: Diagnosis not present

## 2016-09-15 DIAGNOSIS — E119 Type 2 diabetes mellitus without complications: Secondary | ICD-10-CM | POA: Diagnosis not present

## 2016-09-15 DIAGNOSIS — E063 Autoimmune thyroiditis: Secondary | ICD-10-CM | POA: Diagnosis not present

## 2016-09-15 DIAGNOSIS — I50810 Right heart failure, unspecified: Secondary | ICD-10-CM | POA: Diagnosis not present

## 2016-09-15 DIAGNOSIS — E538 Deficiency of other specified B group vitamins: Secondary | ICD-10-CM | POA: Diagnosis not present

## 2016-09-15 DIAGNOSIS — K210 Gastro-esophageal reflux disease with esophagitis: Secondary | ICD-10-CM | POA: Diagnosis not present

## 2016-09-15 DIAGNOSIS — R29898 Other symptoms and signs involving the musculoskeletal system: Secondary | ICD-10-CM | POA: Diagnosis not present

## 2016-09-15 DIAGNOSIS — M818 Other osteoporosis without current pathological fracture: Secondary | ICD-10-CM | POA: Diagnosis not present

## 2016-09-15 DIAGNOSIS — F329 Major depressive disorder, single episode, unspecified: Secondary | ICD-10-CM | POA: Diagnosis not present

## 2016-09-15 DIAGNOSIS — I10 Essential (primary) hypertension: Secondary | ICD-10-CM | POA: Diagnosis not present

## 2016-09-15 DIAGNOSIS — E785 Hyperlipidemia, unspecified: Secondary | ICD-10-CM | POA: Diagnosis not present

## 2016-09-15 DIAGNOSIS — G4700 Insomnia, unspecified: Secondary | ICD-10-CM | POA: Diagnosis not present

## 2016-09-21 DIAGNOSIS — M6281 Muscle weakness (generalized): Secondary | ICD-10-CM | POA: Diagnosis not present

## 2016-09-21 DIAGNOSIS — R2689 Other abnormalities of gait and mobility: Secondary | ICD-10-CM | POA: Diagnosis not present

## 2016-09-25 DIAGNOSIS — R002 Palpitations: Secondary | ICD-10-CM | POA: Diagnosis not present

## 2016-09-28 DIAGNOSIS — S31101A Unspecified open wound of abdominal wall, left upper quadrant without penetration into peritoneal cavity, initial encounter: Secondary | ICD-10-CM | POA: Diagnosis not present

## 2016-10-11 DIAGNOSIS — I10 Essential (primary) hypertension: Secondary | ICD-10-CM | POA: Diagnosis not present

## 2016-10-11 DIAGNOSIS — I484 Atypical atrial flutter: Secondary | ICD-10-CM | POA: Diagnosis not present

## 2016-10-11 DIAGNOSIS — I480 Paroxysmal atrial fibrillation: Secondary | ICD-10-CM | POA: Diagnosis not present

## 2016-10-21 DIAGNOSIS — I480 Paroxysmal atrial fibrillation: Secondary | ICD-10-CM | POA: Diagnosis not present

## 2016-10-23 DIAGNOSIS — I484 Atypical atrial flutter: Secondary | ICD-10-CM | POA: Diagnosis not present

## 2016-10-23 DIAGNOSIS — I480 Paroxysmal atrial fibrillation: Secondary | ICD-10-CM | POA: Diagnosis not present

## 2016-10-23 DIAGNOSIS — I4891 Unspecified atrial fibrillation: Secondary | ICD-10-CM | POA: Diagnosis not present

## 2016-11-07 DIAGNOSIS — I480 Paroxysmal atrial fibrillation: Secondary | ICD-10-CM | POA: Diagnosis not present

## 2016-11-07 DIAGNOSIS — I4891 Unspecified atrial fibrillation: Secondary | ICD-10-CM | POA: Diagnosis not present

## 2016-11-07 DIAGNOSIS — I484 Atypical atrial flutter: Secondary | ICD-10-CM | POA: Diagnosis not present

## 2016-11-22 DIAGNOSIS — R609 Edema, unspecified: Secondary | ICD-10-CM | POA: Diagnosis not present

## 2016-11-22 DIAGNOSIS — I110 Hypertensive heart disease with heart failure: Secondary | ICD-10-CM | POA: Diagnosis not present

## 2016-11-29 DIAGNOSIS — E785 Hyperlipidemia, unspecified: Secondary | ICD-10-CM | POA: Diagnosis not present

## 2016-11-29 DIAGNOSIS — I110 Hypertensive heart disease with heart failure: Secondary | ICD-10-CM | POA: Diagnosis not present

## 2016-11-29 DIAGNOSIS — I484 Atypical atrial flutter: Secondary | ICD-10-CM | POA: Diagnosis not present

## 2016-11-29 DIAGNOSIS — Z79899 Other long term (current) drug therapy: Secondary | ICD-10-CM | POA: Diagnosis not present

## 2016-12-14 DIAGNOSIS — I10 Essential (primary) hypertension: Secondary | ICD-10-CM | POA: Diagnosis not present

## 2016-12-14 DIAGNOSIS — L259 Unspecified contact dermatitis, unspecified cause: Secondary | ICD-10-CM | POA: Diagnosis not present

## 2016-12-14 DIAGNOSIS — E109 Type 1 diabetes mellitus without complications: Secondary | ICD-10-CM | POA: Diagnosis not present

## 2016-12-15 DIAGNOSIS — L03311 Cellulitis of abdominal wall: Secondary | ICD-10-CM | POA: Diagnosis not present

## 2016-12-15 DIAGNOSIS — L03316 Cellulitis of umbilicus: Secondary | ICD-10-CM | POA: Diagnosis not present

## 2016-12-18 DIAGNOSIS — E119 Type 2 diabetes mellitus without complications: Secondary | ICD-10-CM | POA: Diagnosis not present

## 2016-12-18 DIAGNOSIS — M818 Other osteoporosis without current pathological fracture: Secondary | ICD-10-CM | POA: Diagnosis not present

## 2016-12-18 DIAGNOSIS — E785 Hyperlipidemia, unspecified: Secondary | ICD-10-CM | POA: Diagnosis not present

## 2016-12-18 DIAGNOSIS — T782XXA Anaphylactic shock, unspecified, initial encounter: Secondary | ICD-10-CM | POA: Diagnosis not present

## 2016-12-18 DIAGNOSIS — F329 Major depressive disorder, single episode, unspecified: Secondary | ICD-10-CM | POA: Diagnosis not present

## 2016-12-18 DIAGNOSIS — L89892 Pressure ulcer of other site, stage 2: Secondary | ICD-10-CM | POA: Diagnosis not present

## 2016-12-18 DIAGNOSIS — I4891 Unspecified atrial fibrillation: Secondary | ICD-10-CM | POA: Diagnosis not present

## 2016-12-18 DIAGNOSIS — E063 Autoimmune thyroiditis: Secondary | ICD-10-CM | POA: Diagnosis not present

## 2016-12-18 DIAGNOSIS — I10 Essential (primary) hypertension: Secondary | ICD-10-CM | POA: Diagnosis not present

## 2016-12-18 DIAGNOSIS — F419 Anxiety disorder, unspecified: Secondary | ICD-10-CM | POA: Diagnosis not present

## 2016-12-18 DIAGNOSIS — I50810 Right heart failure, unspecified: Secondary | ICD-10-CM | POA: Diagnosis not present

## 2016-12-20 DIAGNOSIS — E0800 Diabetes mellitus due to underlying condition with hyperosmolarity without nonketotic hyperglycemic-hyperosmolar coma (NKHHC): Secondary | ICD-10-CM | POA: Diagnosis not present

## 2016-12-20 DIAGNOSIS — L98492 Non-pressure chronic ulcer of skin of other sites with fat layer exposed: Secondary | ICD-10-CM | POA: Diagnosis not present

## 2016-12-20 DIAGNOSIS — E11620 Type 2 diabetes mellitus with diabetic dermatitis: Secondary | ICD-10-CM | POA: Diagnosis not present

## 2016-12-20 DIAGNOSIS — L921 Necrobiosis lipoidica, not elsewhere classified: Secondary | ICD-10-CM | POA: Diagnosis not present

## 2016-12-20 DIAGNOSIS — I4891 Unspecified atrial fibrillation: Secondary | ICD-10-CM | POA: Diagnosis not present

## 2016-12-20 DIAGNOSIS — I10 Essential (primary) hypertension: Secondary | ICD-10-CM | POA: Diagnosis not present

## 2016-12-20 DIAGNOSIS — Z7984 Long term (current) use of oral hypoglycemic drugs: Secondary | ICD-10-CM | POA: Diagnosis not present

## 2016-12-20 DIAGNOSIS — G629 Polyneuropathy, unspecified: Secondary | ICD-10-CM | POA: Diagnosis not present

## 2016-12-20 DIAGNOSIS — I480 Paroxysmal atrial fibrillation: Secondary | ICD-10-CM | POA: Diagnosis not present

## 2016-12-20 DIAGNOSIS — E784 Other hyperlipidemia: Secondary | ICD-10-CM | POA: Diagnosis not present

## 2016-12-26 DIAGNOSIS — E11620 Type 2 diabetes mellitus with diabetic dermatitis: Secondary | ICD-10-CM | POA: Diagnosis not present

## 2016-12-26 DIAGNOSIS — G629 Polyneuropathy, unspecified: Secondary | ICD-10-CM | POA: Diagnosis not present

## 2016-12-26 DIAGNOSIS — L98499 Non-pressure chronic ulcer of skin of other sites with unspecified severity: Secondary | ICD-10-CM | POA: Diagnosis not present

## 2017-01-02 DIAGNOSIS — E11620 Type 2 diabetes mellitus with diabetic dermatitis: Secondary | ICD-10-CM | POA: Diagnosis not present

## 2017-01-02 DIAGNOSIS — L98492 Non-pressure chronic ulcer of skin of other sites with fat layer exposed: Secondary | ICD-10-CM | POA: Diagnosis not present

## 2017-01-02 DIAGNOSIS — G629 Polyneuropathy, unspecified: Secondary | ICD-10-CM | POA: Diagnosis not present

## 2017-01-02 DIAGNOSIS — L921 Necrobiosis lipoidica, not elsewhere classified: Secondary | ICD-10-CM | POA: Diagnosis not present

## 2017-01-03 DIAGNOSIS — I480 Paroxysmal atrial fibrillation: Secondary | ICD-10-CM | POA: Diagnosis not present

## 2017-01-08 DIAGNOSIS — I480 Paroxysmal atrial fibrillation: Secondary | ICD-10-CM | POA: Diagnosis not present

## 2017-01-09 DIAGNOSIS — L98499 Non-pressure chronic ulcer of skin of other sites with unspecified severity: Secondary | ICD-10-CM | POA: Diagnosis not present

## 2017-01-09 DIAGNOSIS — Z09 Encounter for follow-up examination after completed treatment for conditions other than malignant neoplasm: Secondary | ICD-10-CM | POA: Diagnosis not present

## 2017-01-19 DIAGNOSIS — F419 Anxiety disorder, unspecified: Secondary | ICD-10-CM | POA: Diagnosis not present

## 2017-01-19 DIAGNOSIS — I4891 Unspecified atrial fibrillation: Secondary | ICD-10-CM | POA: Diagnosis not present

## 2017-01-19 DIAGNOSIS — T782XXA Anaphylactic shock, unspecified, initial encounter: Secondary | ICD-10-CM | POA: Diagnosis not present

## 2017-01-19 DIAGNOSIS — I50810 Right heart failure, unspecified: Secondary | ICD-10-CM | POA: Diagnosis not present

## 2017-01-19 DIAGNOSIS — E119 Type 2 diabetes mellitus without complications: Secondary | ICD-10-CM | POA: Diagnosis not present

## 2017-01-19 DIAGNOSIS — Z6832 Body mass index (BMI) 32.0-32.9, adult: Secondary | ICD-10-CM | POA: Diagnosis not present

## 2017-01-19 DIAGNOSIS — I10 Essential (primary) hypertension: Secondary | ICD-10-CM | POA: Diagnosis not present

## 2017-01-19 DIAGNOSIS — R0602 Shortness of breath: Secondary | ICD-10-CM | POA: Diagnosis not present

## 2017-01-23 DIAGNOSIS — I509 Heart failure, unspecified: Secondary | ICD-10-CM | POA: Diagnosis not present

## 2017-01-23 DIAGNOSIS — I4891 Unspecified atrial fibrillation: Secondary | ICD-10-CM | POA: Diagnosis not present

## 2017-01-23 DIAGNOSIS — I340 Nonrheumatic mitral (valve) insufficiency: Secondary | ICD-10-CM | POA: Diagnosis not present

## 2017-01-23 DIAGNOSIS — I50810 Right heart failure, unspecified: Secondary | ICD-10-CM | POA: Diagnosis not present

## 2017-02-02 DIAGNOSIS — I50810 Right heart failure, unspecified: Secondary | ICD-10-CM | POA: Diagnosis not present

## 2017-02-02 DIAGNOSIS — I4891 Unspecified atrial fibrillation: Secondary | ICD-10-CM | POA: Diagnosis not present

## 2017-02-02 DIAGNOSIS — Z6832 Body mass index (BMI) 32.0-32.9, adult: Secondary | ICD-10-CM | POA: Diagnosis not present

## 2017-03-08 DIAGNOSIS — S300XXA Contusion of lower back and pelvis, initial encounter: Secondary | ICD-10-CM | POA: Diagnosis not present

## 2017-03-08 DIAGNOSIS — M545 Low back pain: Secondary | ICD-10-CM | POA: Diagnosis not present

## 2017-03-22 DIAGNOSIS — M1711 Unilateral primary osteoarthritis, right knee: Secondary | ICD-10-CM | POA: Diagnosis not present

## 2017-03-23 DIAGNOSIS — G629 Polyneuropathy, unspecified: Secondary | ICD-10-CM | POA: Diagnosis not present

## 2017-03-23 DIAGNOSIS — E559 Vitamin D deficiency, unspecified: Secondary | ICD-10-CM | POA: Diagnosis not present

## 2017-03-23 DIAGNOSIS — F419 Anxiety disorder, unspecified: Secondary | ICD-10-CM | POA: Diagnosis not present

## 2017-03-23 DIAGNOSIS — I10 Essential (primary) hypertension: Secondary | ICD-10-CM | POA: Diagnosis not present

## 2017-03-23 DIAGNOSIS — E119 Type 2 diabetes mellitus without complications: Secondary | ICD-10-CM | POA: Diagnosis not present

## 2017-03-23 DIAGNOSIS — Z6832 Body mass index (BMI) 32.0-32.9, adult: Secondary | ICD-10-CM | POA: Diagnosis not present

## 2017-03-23 DIAGNOSIS — I4891 Unspecified atrial fibrillation: Secondary | ICD-10-CM | POA: Diagnosis not present

## 2017-03-23 DIAGNOSIS — D649 Anemia, unspecified: Secondary | ICD-10-CM | POA: Diagnosis not present

## 2017-03-23 DIAGNOSIS — E063 Autoimmune thyroiditis: Secondary | ICD-10-CM | POA: Diagnosis not present

## 2017-03-23 DIAGNOSIS — E538 Deficiency of other specified B group vitamins: Secondary | ICD-10-CM | POA: Diagnosis not present

## 2017-03-23 DIAGNOSIS — K210 Gastro-esophageal reflux disease with esophagitis: Secondary | ICD-10-CM | POA: Diagnosis not present

## 2017-03-23 DIAGNOSIS — E785 Hyperlipidemia, unspecified: Secondary | ICD-10-CM | POA: Diagnosis not present

## 2017-03-23 DIAGNOSIS — I50810 Right heart failure, unspecified: Secondary | ICD-10-CM | POA: Diagnosis not present

## 2017-03-29 DIAGNOSIS — M1712 Unilateral primary osteoarthritis, left knee: Secondary | ICD-10-CM | POA: Diagnosis not present

## 2017-04-12 DIAGNOSIS — M1712 Unilateral primary osteoarthritis, left knee: Secondary | ICD-10-CM | POA: Diagnosis not present

## 2017-04-13 DIAGNOSIS — E063 Autoimmune thyroiditis: Secondary | ICD-10-CM | POA: Diagnosis not present

## 2017-04-13 DIAGNOSIS — Z6830 Body mass index (BMI) 30.0-30.9, adult: Secondary | ICD-10-CM | POA: Diagnosis not present

## 2017-04-13 DIAGNOSIS — T782XXA Anaphylactic shock, unspecified, initial encounter: Secondary | ICD-10-CM | POA: Diagnosis not present

## 2017-04-13 DIAGNOSIS — I4891 Unspecified atrial fibrillation: Secondary | ICD-10-CM | POA: Diagnosis not present

## 2017-04-13 DIAGNOSIS — I50810 Right heart failure, unspecified: Secondary | ICD-10-CM | POA: Diagnosis not present

## 2017-04-19 DIAGNOSIS — M1712 Unilateral primary osteoarthritis, left knee: Secondary | ICD-10-CM | POA: Diagnosis not present

## 2017-04-26 DIAGNOSIS — M1712 Unilateral primary osteoarthritis, left knee: Secondary | ICD-10-CM | POA: Diagnosis not present

## 2017-05-08 DIAGNOSIS — Z23 Encounter for immunization: Secondary | ICD-10-CM | POA: Diagnosis not present

## 2017-06-05 DIAGNOSIS — K222 Esophageal obstruction: Secondary | ICD-10-CM

## 2017-06-05 DIAGNOSIS — I480 Paroxysmal atrial fibrillation: Secondary | ICD-10-CM | POA: Diagnosis not present

## 2017-06-05 DIAGNOSIS — I10 Essential (primary) hypertension: Secondary | ICD-10-CM | POA: Diagnosis not present

## 2017-06-05 DIAGNOSIS — E119 Type 2 diabetes mellitus without complications: Secondary | ICD-10-CM | POA: Diagnosis not present

## 2017-06-05 DIAGNOSIS — E785 Hyperlipidemia, unspecified: Secondary | ICD-10-CM | POA: Diagnosis not present

## 2017-06-05 HISTORY — DX: Esophageal obstruction: K22.2

## 2017-06-12 DIAGNOSIS — H25812 Combined forms of age-related cataract, left eye: Secondary | ICD-10-CM | POA: Diagnosis not present

## 2017-06-12 DIAGNOSIS — H25811 Combined forms of age-related cataract, right eye: Secondary | ICD-10-CM | POA: Diagnosis not present

## 2017-06-12 DIAGNOSIS — H35373 Puckering of macula, bilateral: Secondary | ICD-10-CM | POA: Diagnosis not present

## 2017-06-12 DIAGNOSIS — H43812 Vitreous degeneration, left eye: Secondary | ICD-10-CM | POA: Diagnosis not present

## 2017-06-12 DIAGNOSIS — H04123 Dry eye syndrome of bilateral lacrimal glands: Secondary | ICD-10-CM | POA: Diagnosis not present

## 2017-06-21 DIAGNOSIS — H25811 Combined forms of age-related cataract, right eye: Secondary | ICD-10-CM | POA: Diagnosis not present

## 2017-06-21 DIAGNOSIS — H2511 Age-related nuclear cataract, right eye: Secondary | ICD-10-CM | POA: Diagnosis not present

## 2017-07-04 DIAGNOSIS — H2512 Age-related nuclear cataract, left eye: Secondary | ICD-10-CM | POA: Diagnosis not present

## 2017-07-04 DIAGNOSIS — H25812 Combined forms of age-related cataract, left eye: Secondary | ICD-10-CM | POA: Diagnosis not present

## 2017-07-23 DIAGNOSIS — K2270 Barrett's esophagus without dysplasia: Secondary | ICD-10-CM | POA: Diagnosis not present

## 2017-07-23 DIAGNOSIS — R1310 Dysphagia, unspecified: Secondary | ICD-10-CM | POA: Diagnosis not present

## 2017-07-27 DIAGNOSIS — E559 Vitamin D deficiency, unspecified: Secondary | ICD-10-CM | POA: Diagnosis not present

## 2017-07-27 DIAGNOSIS — E785 Hyperlipidemia, unspecified: Secondary | ICD-10-CM | POA: Diagnosis not present

## 2017-07-27 DIAGNOSIS — I4891 Unspecified atrial fibrillation: Secondary | ICD-10-CM | POA: Diagnosis not present

## 2017-07-27 DIAGNOSIS — Z6831 Body mass index (BMI) 31.0-31.9, adult: Secondary | ICD-10-CM | POA: Diagnosis not present

## 2017-07-27 DIAGNOSIS — F419 Anxiety disorder, unspecified: Secondary | ICD-10-CM | POA: Diagnosis not present

## 2017-07-27 DIAGNOSIS — E063 Autoimmune thyroiditis: Secondary | ICD-10-CM | POA: Diagnosis not present

## 2017-07-27 DIAGNOSIS — E538 Deficiency of other specified B group vitamins: Secondary | ICD-10-CM | POA: Diagnosis not present

## 2017-07-27 DIAGNOSIS — Z9181 History of falling: Secondary | ICD-10-CM | POA: Diagnosis not present

## 2017-07-27 DIAGNOSIS — I50810 Right heart failure, unspecified: Secondary | ICD-10-CM | POA: Diagnosis not present

## 2017-07-27 DIAGNOSIS — Z23 Encounter for immunization: Secondary | ICD-10-CM | POA: Diagnosis not present

## 2017-07-27 DIAGNOSIS — E119 Type 2 diabetes mellitus without complications: Secondary | ICD-10-CM | POA: Diagnosis not present

## 2017-07-27 DIAGNOSIS — I10 Essential (primary) hypertension: Secondary | ICD-10-CM | POA: Diagnosis not present

## 2017-08-03 DIAGNOSIS — Z1231 Encounter for screening mammogram for malignant neoplasm of breast: Secondary | ICD-10-CM | POA: Diagnosis not present

## 2017-08-24 DIAGNOSIS — H26493 Other secondary cataract, bilateral: Secondary | ICD-10-CM | POA: Diagnosis not present

## 2017-08-24 DIAGNOSIS — H26492 Other secondary cataract, left eye: Secondary | ICD-10-CM | POA: Diagnosis not present

## 2017-08-24 DIAGNOSIS — H04123 Dry eye syndrome of bilateral lacrimal glands: Secondary | ICD-10-CM | POA: Diagnosis not present

## 2017-08-24 DIAGNOSIS — H35373 Puckering of macula, bilateral: Secondary | ICD-10-CM | POA: Diagnosis not present

## 2017-09-10 DIAGNOSIS — H35372 Puckering of macula, left eye: Secondary | ICD-10-CM | POA: Diagnosis not present

## 2017-09-10 DIAGNOSIS — H35371 Puckering of macula, right eye: Secondary | ICD-10-CM | POA: Diagnosis not present

## 2017-09-10 DIAGNOSIS — H43813 Vitreous degeneration, bilateral: Secondary | ICD-10-CM | POA: Diagnosis not present

## 2017-09-19 DIAGNOSIS — H5789 Other specified disorders of eye and adnexa: Secondary | ICD-10-CM | POA: Diagnosis not present

## 2017-09-19 DIAGNOSIS — H35372 Puckering of macula, left eye: Secondary | ICD-10-CM | POA: Diagnosis not present

## 2017-09-27 DIAGNOSIS — H35372 Puckering of macula, left eye: Secondary | ICD-10-CM | POA: Diagnosis not present

## 2017-10-31 DIAGNOSIS — E538 Deficiency of other specified B group vitamins: Secondary | ICD-10-CM | POA: Insufficient documentation

## 2017-10-31 DIAGNOSIS — E1140 Type 2 diabetes mellitus with diabetic neuropathy, unspecified: Secondary | ICD-10-CM

## 2017-10-31 HISTORY — DX: Type 2 diabetes mellitus with diabetic neuropathy, unspecified: E11.40

## 2017-10-31 HISTORY — DX: Deficiency of other specified B group vitamins: E53.8

## 2017-11-07 DIAGNOSIS — Z79899 Other long term (current) drug therapy: Secondary | ICD-10-CM | POA: Insufficient documentation

## 2017-11-07 HISTORY — DX: Other long term (current) drug therapy: Z79.899

## 2017-11-07 NOTE — Progress Notes (Signed)
Cardiology Office Note:    Date:  11/08/2017   ID:  [name], DOB December 18, 1950, MRN [id number]  PCP:  Ocie Doyne., MD  Cardiologist:  Shirlee More, MD    Referring MD: Ocie Doyne., MD    ASSESSMENT:    1. Paroxysmal atrial fibrillation (HCC)   2. Hypertensive heart disease without heart failure   3. High risk medication use    PLAN:    In order of problems listed above:  1. Stable she has had no recurrence on generic propafenone low-dose and will continue the same.  She may require additional eye surgery and use wait until she sees ophthalmology and will call me and if no additional surgery will discontinue aspirin and resume Xarelto.  Presently she is taking aspirin 81 mg daily as stroke prophylaxis. 2. Stable continue current treatment beta-blocker and diuretic 3. Continue propafenone EKG is normal the dose is low and I do not think we require a drug level.   Next appointment: 6 months   Medication Adjustments/Labs and Tests Ordered: Current medicines are reviewed at length with the patient today.  Concerns regarding medicines are outlined above.  Orders Placed This Encounter  Procedures  . EKG 12-Lead   No orders of the defined types were placed in this encounter.   Chief Complaint  Patient presents with  . Follow-up  . Atrial Flutter  . Hypertension    History of Present Illness:    Tanis Hensarling is a 67 y.o. female with a hx of PAF with CHADS2 vasc of 4 on propafenone,  hypertension  and T2 DM last seen 11/29/16.She carried a 30 day monitor and noted several episodes of atrial flutter and atrial fibrillation prior to use of flecainide. Echocardiogram showed preserved left ventricular ejection fraction with moderate left atrial enlargement and no valvular heart disease. Holter 01/11/17 with brief atrial flutter.  Compliance with diet, lifestyle and medications: Yes  She has very little palpitation not severe sustained and she is pleased with the cost and side  effect profile of Lia Hopping known.  No chest pain shortness of breath edema syncope or TIA.  She has had multiple eye surgeries presently is off anticoagulant taking aspirin and she will call me after she follows up with ophthalmology no further surgery will transition back to Xarelto.  She understands not to take aspirin and an anticoagulant.  Past Medical History:  Diagnosis Date  . Arthritis   . B12 deficiency 10/31/2017  . Cellulitis of abdominal wall 07/07/2016  . Depression   . Diabetes mellitus without complication ([place])   . Esophageal stricture 06/05/2017  . Essential hypertension 02/24/2013  . Fibromyalgia 02/24/2013  . Full dentures   . Hereditary and idiopathic peripheral neuropathy 02/24/2013  . Hyperlipidemia 02/24/2013  . Hypertension   . Murmur, cardiac 08/15/2015  . Neuropathy in diabetes ([place]) 10/31/2017  . Osteoporosis   . Paroxysmal atrial fibrillation ([place]) 08/15/2015   CHADS2vasc=3 CHADS2vasc=3  . Restless legs syndrome 03/14/2013  . Thoracic or lumbosacral neuritis or radiculitis 02/24/2013  . Type 2 diabetes mellitus, without long-term current use of insulin ([place]) 02/24/2013  . Wears glasses     Past Surgical History:  Procedure Laterality Date  . CHOLECYSTECTOMY  1971   open  . DIAGNOSTIC LAPAROSCOPY  2010   lysis of adhesions  . DILATION AND CURETTAGE OF UTERUS  2004  . KNEE ARTHROSCOPY  1998   left  . MASS EXCISION Left 11/11/2013   Procedure: EXCISION MUCOID CYST LEFT INDEX FINGER/DEBRIDEMENT LEFT  INDEX FINGER;  Surgeon: Wynonia Sours, MD;  Location: [place];  Service: Orthopedics;  Laterality: Left;  ANESTHESIA: IV REGIONAL/FAB  . SHOULDER ARTHROSCOPY W/ ROTATOR CUFF REPAIR  2007   left  . TRIGGER FINGER RELEASE Left 11/11/2013   Procedure: RELEASE A-1 PULLEY LEFT RING FINGER;  Surgeon: Wynonia Sours, MD;  Location: [place];  Service: Orthopedics;  Laterality: Left;  . UMBILICAL HERNIA REPAIR  2008    Current  Medications: Current Meds  Medication Sig  . aspirin 81 MG tablet Take 81 mg by mouth daily.  Marland Kitchen b complex vitamins tablet Take 1 tablet by mouth daily.  . cyanocobalamin (,VITAMIN B-12,) 1000 MCG/ML injection Inject 1,000 mcg into the muscle every 30 (thirty) days.  . DULoxetine (CYMBALTA) 60 MG capsule Take 60 mg by mouth 2 (two) times daily.  Marland Kitchen gabapentin (NEURONTIN) 300 MG capsule Take 300 mg by mouth daily.  . metFORMIN (GLUCOPHAGE) 1000 MG tablet Take 1,000 mg by mouth 2 (two) times daily with a meal.  . metoprolol succinate (TOPROL-XL) 50 MG 24 hr tablet Take 50 mg by mouth every evening.   . Probiotic Product (PROBIOTIC & ACIDOPHILUS EX ST PO) Take 1 tablet by mouth daily.   . propafenone (RYTHMOL) 150 MG tablet Take 150 mg by mouth 2 (two) times daily.  . rivaroxaban (XARELTO) 20 MG TABS tablet Take 20 mg by mouth daily with supper.  . torsemide (DEMADEX) 20 MG tablet Take 20 mg by mouth daily.     Allergies:   Prednisone   Social History   Socioeconomic History  . Marital status: Married    Spouse name: Not on file  . Number of children: Not on file  . Years of education: Not on file  . Highest education level: Not on file  Occupational History  . Not on file  Social Needs  . Financial resource strain: Not on file  . Food insecurity:    Worry: Not on file    Inability: Not on file  . Transportation needs:    Medical: Not on file    Non-medical: Not on file  Tobacco Use  . Smoking status: Never Smoker  . Smokeless tobacco: Never Used  Substance and Sexual Activity  . Alcohol use: No    Comment: very rare  . Drug use: No  . Sexual activity: Not on file  Lifestyle  . Physical activity:    Days per week: Not on file    Minutes per session: Not on file  . Stress: Not on file  Relationships  . Social connections:    Talks on phone: Not on file    Gets together: Not on file    Attends religious service: Not on file    Active member of club or organization: Not  on file    Attends meetings of clubs or organizations: Not on file    Relationship status: Not on file  Other Topics Concern  . Not on file  Social History Narrative  . Not on file     Family History: The patient's family history includes Heart attack in her brother; Heart disease in her brother; Heart failure in her father and paternal grandfather. ROS:   Please see the history of present illness.    All other systems reviewed and are negative.  EKGs/Labs/Other Studies Reviewed:    The following studies were reviewed today:  EKG:  EKG ordered today.  The ekg ordered today demonstrates sinus rhythm left  atrial enlargement intra-atrial conduction delay otherwise normal  Holter 01/11/17: CONCLUSIONS: 1. Abnormal Holter monitor. Few short runs ofself limiting A fib/flutter with controlled rate noted with conversion pause up to 2.5 seconds 2. Palpitations reported, some correlated to A flutter  Recent Labs: TSH creatinine normal 07/27/17 No results found for requested labs within last 8760 hours.  Recent Lipid Panel  Chol 185 HDL 58 LDL 82 No results found for: CHOL, TRIG, HDL, CHOLHDL, VLDL, LDLCALC, LDLDIRECT  Physical Exam:    VS:  BP 138/84 (BP Location: Right Arm, Patient Position: Sitting, Cuff Size: Normal)   Pulse 84   Ht 5\' 7"  (1.702 m)   Wt 197 lb (89.4 kg)   SpO2 97%   BMI 30.85 kg/m     Wt Readings from Last 3 Encounters:  11/08/17 197 lb (89.4 kg)  11/11/13 211 lb (95.7 kg)     GEN:  Well nourished, well developed in no acute distress HEENT: Normal NECK: No JVD; No carotid bruits LYMPHATICS: No lymphadenopathy CARDIAC: RRR, no murmurs, rubs, gallops RESPIRATORY:  Clear to auscultation without rales, wheezing or rhonchi  ABDOMEN: Soft, non-tender, non-distended MUSCULOSKELETAL:  No edema; No deformity  SKIN: Warm and dry NEUROLOGIC:  Alert and oriented x 3 PSYCHIATRIC:  Normal affect    Signed, Shirlee More, MD  11/08/2017 3:44 PM    [facility]

## 2017-11-08 ENCOUNTER — Encounter: Payer: Self-pay | Admitting: Cardiology

## 2017-11-08 ENCOUNTER — Ambulatory Visit (INDEPENDENT_AMBULATORY_CARE_PROVIDER_SITE_OTHER): Payer: Medicare Other | Admitting: Cardiology

## 2017-11-08 VITALS — BP 138/84 | HR 84 | Ht 67.0 in | Wt 197.0 lb

## 2017-11-08 DIAGNOSIS — I119 Hypertensive heart disease without heart failure: Secondary | ICD-10-CM | POA: Diagnosis not present

## 2017-11-08 DIAGNOSIS — Z79899 Other long term (current) drug therapy: Secondary | ICD-10-CM | POA: Diagnosis not present

## 2017-11-08 DIAGNOSIS — I480 Paroxysmal atrial fibrillation: Secondary | ICD-10-CM | POA: Diagnosis not present

## 2017-11-08 DIAGNOSIS — H35352 Cystoid macular degeneration, left eye: Secondary | ICD-10-CM | POA: Diagnosis not present

## 2017-11-08 NOTE — Patient Instructions (Addendum)
Medication Instructions:  Your physician recommends that you continue on your current medications as directed. Please refer to the Current Medication list given to you today.  Call Dr. Bettina Gavia after you see the opthamology about restarting Xarelto.  Labwork: None  Testing/Procedures: None  Follow-Up: Your physician wants you to follow-up in: 6 months. You will receive a reminder letter in the mail two months in advance. If you don't receive a letter, please call our office to schedule the follow-up appointment.  Any Other Special Instructions Will Be Listed Below (If Applicable).     If you need a refill on your cardiac medications before your next appointment, please call your pharmacy.

## 2017-11-09 ENCOUNTER — Ambulatory Visit: Payer: Medicare HMO | Admitting: Cardiology

## 2017-11-19 DIAGNOSIS — Z872 Personal history of diseases of the skin and subcutaneous tissue: Secondary | ICD-10-CM | POA: Diagnosis not present

## 2017-11-19 DIAGNOSIS — L98499 Non-pressure chronic ulcer of skin of other sites with unspecified severity: Secondary | ICD-10-CM | POA: Diagnosis not present

## 2017-11-19 DIAGNOSIS — Z09 Encounter for follow-up examination after completed treatment for conditions other than malignant neoplasm: Secondary | ICD-10-CM | POA: Diagnosis not present

## 2017-11-19 DIAGNOSIS — R21 Rash and other nonspecific skin eruption: Secondary | ICD-10-CM | POA: Diagnosis not present

## 2017-12-04 ENCOUNTER — Telehealth: Payer: Self-pay | Admitting: Cardiology

## 2017-12-04 MED ORDER — PROPAFENONE HCL 150 MG PO TABS: 150.0000 mg | ORAL_TABLET | Freq: Two times a day (BID) | ORAL | 3 refills | Status: DC

## 2017-12-04 NOTE — Telephone Encounter (Signed)
Call propafonone 150 mg to [facility]

## 2017-12-04 NOTE — Telephone Encounter (Signed)
Refill sent.

## 2017-12-11 DIAGNOSIS — H35372 Puckering of macula, left eye: Secondary | ICD-10-CM | POA: Diagnosis not present

## 2017-12-19 DIAGNOSIS — M2012 Hallux valgus (acquired), left foot: Secondary | ICD-10-CM | POA: Diagnosis not present

## 2017-12-19 DIAGNOSIS — E1142 Type 2 diabetes mellitus with diabetic polyneuropathy: Secondary | ICD-10-CM | POA: Diagnosis not present

## 2017-12-19 DIAGNOSIS — M2011 Hallux valgus (acquired), right foot: Secondary | ICD-10-CM | POA: Insufficient documentation

## 2017-12-19 DIAGNOSIS — M21621 Bunionette of right foot: Secondary | ICD-10-CM | POA: Diagnosis not present

## 2017-12-19 DIAGNOSIS — M21622 Bunionette of left foot: Secondary | ICD-10-CM | POA: Diagnosis not present

## 2018-01-24 DIAGNOSIS — E063 Autoimmune thyroiditis: Secondary | ICD-10-CM | POA: Diagnosis not present

## 2018-01-24 DIAGNOSIS — E118 Type 2 diabetes mellitus with unspecified complications: Secondary | ICD-10-CM | POA: Diagnosis not present

## 2018-01-24 DIAGNOSIS — F419 Anxiety disorder, unspecified: Secondary | ICD-10-CM | POA: Diagnosis not present

## 2018-01-24 DIAGNOSIS — G629 Polyneuropathy, unspecified: Secondary | ICD-10-CM | POA: Diagnosis not present

## 2018-01-24 DIAGNOSIS — E559 Vitamin D deficiency, unspecified: Secondary | ICD-10-CM | POA: Diagnosis not present

## 2018-01-24 DIAGNOSIS — E669 Obesity, unspecified: Secondary | ICD-10-CM | POA: Diagnosis not present

## 2018-01-24 DIAGNOSIS — E785 Hyperlipidemia, unspecified: Secondary | ICD-10-CM | POA: Diagnosis not present

## 2018-01-24 DIAGNOSIS — K210 Gastro-esophageal reflux disease with esophagitis: Secondary | ICD-10-CM | POA: Diagnosis not present

## 2018-01-30 DIAGNOSIS — M1712 Unilateral primary osteoarthritis, left knee: Secondary | ICD-10-CM | POA: Diagnosis not present

## 2018-02-12 DIAGNOSIS — M1712 Unilateral primary osteoarthritis, left knee: Secondary | ICD-10-CM | POA: Diagnosis not present

## 2018-02-19 DIAGNOSIS — M1712 Unilateral primary osteoarthritis, left knee: Secondary | ICD-10-CM | POA: Diagnosis not present

## 2018-02-27 DIAGNOSIS — M1712 Unilateral primary osteoarthritis, left knee: Secondary | ICD-10-CM | POA: Diagnosis not present

## 2018-04-01 DIAGNOSIS — J0100 Acute maxillary sinusitis, unspecified: Secondary | ICD-10-CM | POA: Diagnosis not present

## 2018-04-01 DIAGNOSIS — R1310 Dysphagia, unspecified: Secondary | ICD-10-CM | POA: Diagnosis not present

## 2018-04-01 DIAGNOSIS — K2270 Barrett's esophagus without dysplasia: Secondary | ICD-10-CM | POA: Diagnosis not present

## 2018-04-01 DIAGNOSIS — K589 Irritable bowel syndrome without diarrhea: Secondary | ICD-10-CM | POA: Diagnosis not present

## 2018-04-01 DIAGNOSIS — J309 Allergic rhinitis, unspecified: Secondary | ICD-10-CM | POA: Diagnosis not present

## 2018-04-03 DIAGNOSIS — L84 Corns and callosities: Secondary | ICD-10-CM | POA: Diagnosis not present

## 2018-04-03 DIAGNOSIS — M21611 Bunion of right foot: Secondary | ICD-10-CM | POA: Diagnosis not present

## 2018-04-03 DIAGNOSIS — M2041 Other hammer toe(s) (acquired), right foot: Secondary | ICD-10-CM | POA: Diagnosis not present

## 2018-04-03 DIAGNOSIS — B070 Plantar wart: Secondary | ICD-10-CM | POA: Diagnosis not present

## 2018-04-18 DIAGNOSIS — B079 Viral wart, unspecified: Secondary | ICD-10-CM | POA: Diagnosis not present

## 2018-04-18 DIAGNOSIS — L209 Atopic dermatitis, unspecified: Secondary | ICD-10-CM | POA: Diagnosis not present

## 2018-04-26 DIAGNOSIS — S134XXA Sprain of ligaments of cervical spine, initial encounter: Secondary | ICD-10-CM | POA: Diagnosis not present

## 2018-04-26 DIAGNOSIS — S335XXA Sprain of ligaments of lumbar spine, initial encounter: Secondary | ICD-10-CM | POA: Diagnosis not present

## 2018-04-26 DIAGNOSIS — M9903 Segmental and somatic dysfunction of lumbar region: Secondary | ICD-10-CM | POA: Diagnosis not present

## 2018-04-26 DIAGNOSIS — M9902 Segmental and somatic dysfunction of thoracic region: Secondary | ICD-10-CM | POA: Diagnosis not present

## 2018-04-26 DIAGNOSIS — M5116 Intervertebral disc disorders with radiculopathy, lumbar region: Secondary | ICD-10-CM | POA: Diagnosis not present

## 2018-04-26 DIAGNOSIS — M9901 Segmental and somatic dysfunction of cervical region: Secondary | ICD-10-CM | POA: Diagnosis not present

## 2018-04-26 DIAGNOSIS — M62830 Muscle spasm of back: Secondary | ICD-10-CM | POA: Diagnosis not present

## 2018-04-26 DIAGNOSIS — M542 Cervicalgia: Secondary | ICD-10-CM | POA: Diagnosis not present

## 2018-04-29 DIAGNOSIS — H04123 Dry eye syndrome of bilateral lacrimal glands: Secondary | ICD-10-CM | POA: Diagnosis not present

## 2018-04-29 DIAGNOSIS — Z961 Presence of intraocular lens: Secondary | ICD-10-CM | POA: Diagnosis not present

## 2018-05-06 DIAGNOSIS — S134XXA Sprain of ligaments of cervical spine, initial encounter: Secondary | ICD-10-CM | POA: Diagnosis not present

## 2018-05-06 DIAGNOSIS — M542 Cervicalgia: Secondary | ICD-10-CM | POA: Diagnosis not present

## 2018-05-06 DIAGNOSIS — S335XXA Sprain of ligaments of lumbar spine, initial encounter: Secondary | ICD-10-CM | POA: Diagnosis not present

## 2018-05-06 DIAGNOSIS — M5116 Intervertebral disc disorders with radiculopathy, lumbar region: Secondary | ICD-10-CM | POA: Diagnosis not present

## 2018-05-06 DIAGNOSIS — M9902 Segmental and somatic dysfunction of thoracic region: Secondary | ICD-10-CM | POA: Diagnosis not present

## 2018-05-06 DIAGNOSIS — M9903 Segmental and somatic dysfunction of lumbar region: Secondary | ICD-10-CM | POA: Diagnosis not present

## 2018-05-06 DIAGNOSIS — M9901 Segmental and somatic dysfunction of cervical region: Secondary | ICD-10-CM | POA: Diagnosis not present

## 2018-05-06 DIAGNOSIS — Z23 Encounter for immunization: Secondary | ICD-10-CM | POA: Diagnosis not present

## 2018-05-06 DIAGNOSIS — M62830 Muscle spasm of back: Secondary | ICD-10-CM | POA: Diagnosis not present

## 2018-05-07 DIAGNOSIS — S134XXA Sprain of ligaments of cervical spine, initial encounter: Secondary | ICD-10-CM | POA: Diagnosis not present

## 2018-05-07 DIAGNOSIS — M62830 Muscle spasm of back: Secondary | ICD-10-CM | POA: Diagnosis not present

## 2018-05-07 DIAGNOSIS — M9901 Segmental and somatic dysfunction of cervical region: Secondary | ICD-10-CM | POA: Diagnosis not present

## 2018-05-07 DIAGNOSIS — S335XXA Sprain of ligaments of lumbar spine, initial encounter: Secondary | ICD-10-CM | POA: Diagnosis not present

## 2018-05-07 DIAGNOSIS — M9903 Segmental and somatic dysfunction of lumbar region: Secondary | ICD-10-CM | POA: Diagnosis not present

## 2018-05-07 DIAGNOSIS — M9902 Segmental and somatic dysfunction of thoracic region: Secondary | ICD-10-CM | POA: Diagnosis not present

## 2018-05-07 DIAGNOSIS — M542 Cervicalgia: Secondary | ICD-10-CM | POA: Diagnosis not present

## 2018-05-07 DIAGNOSIS — M5116 Intervertebral disc disorders with radiculopathy, lumbar region: Secondary | ICD-10-CM | POA: Diagnosis not present

## 2018-05-08 DIAGNOSIS — M9902 Segmental and somatic dysfunction of thoracic region: Secondary | ICD-10-CM | POA: Diagnosis not present

## 2018-05-08 DIAGNOSIS — M5116 Intervertebral disc disorders with radiculopathy, lumbar region: Secondary | ICD-10-CM | POA: Diagnosis not present

## 2018-05-08 DIAGNOSIS — M62830 Muscle spasm of back: Secondary | ICD-10-CM | POA: Diagnosis not present

## 2018-05-08 DIAGNOSIS — M542 Cervicalgia: Secondary | ICD-10-CM | POA: Diagnosis not present

## 2018-05-08 DIAGNOSIS — S134XXA Sprain of ligaments of cervical spine, initial encounter: Secondary | ICD-10-CM | POA: Diagnosis not present

## 2018-05-08 DIAGNOSIS — M9903 Segmental and somatic dysfunction of lumbar region: Secondary | ICD-10-CM | POA: Diagnosis not present

## 2018-05-08 DIAGNOSIS — S335XXA Sprain of ligaments of lumbar spine, initial encounter: Secondary | ICD-10-CM | POA: Diagnosis not present

## 2018-05-08 DIAGNOSIS — M9901 Segmental and somatic dysfunction of cervical region: Secondary | ICD-10-CM | POA: Diagnosis not present

## 2018-05-13 DIAGNOSIS — M9901 Segmental and somatic dysfunction of cervical region: Secondary | ICD-10-CM | POA: Diagnosis not present

## 2018-05-13 DIAGNOSIS — M5116 Intervertebral disc disorders with radiculopathy, lumbar region: Secondary | ICD-10-CM | POA: Diagnosis not present

## 2018-05-13 DIAGNOSIS — M62830 Muscle spasm of back: Secondary | ICD-10-CM | POA: Diagnosis not present

## 2018-05-13 DIAGNOSIS — M542 Cervicalgia: Secondary | ICD-10-CM | POA: Diagnosis not present

## 2018-05-13 DIAGNOSIS — M9903 Segmental and somatic dysfunction of lumbar region: Secondary | ICD-10-CM | POA: Diagnosis not present

## 2018-05-13 DIAGNOSIS — S335XXA Sprain of ligaments of lumbar spine, initial encounter: Secondary | ICD-10-CM | POA: Diagnosis not present

## 2018-05-13 DIAGNOSIS — M9902 Segmental and somatic dysfunction of thoracic region: Secondary | ICD-10-CM | POA: Diagnosis not present

## 2018-05-13 DIAGNOSIS — S134XXA Sprain of ligaments of cervical spine, initial encounter: Secondary | ICD-10-CM | POA: Diagnosis not present

## 2018-05-15 DIAGNOSIS — M9902 Segmental and somatic dysfunction of thoracic region: Secondary | ICD-10-CM | POA: Diagnosis not present

## 2018-05-15 DIAGNOSIS — S335XXA Sprain of ligaments of lumbar spine, initial encounter: Secondary | ICD-10-CM | POA: Diagnosis not present

## 2018-05-15 DIAGNOSIS — M62830 Muscle spasm of back: Secondary | ICD-10-CM | POA: Diagnosis not present

## 2018-05-15 DIAGNOSIS — M9903 Segmental and somatic dysfunction of lumbar region: Secondary | ICD-10-CM | POA: Diagnosis not present

## 2018-05-15 DIAGNOSIS — M542 Cervicalgia: Secondary | ICD-10-CM | POA: Diagnosis not present

## 2018-05-15 DIAGNOSIS — M5116 Intervertebral disc disorders with radiculopathy, lumbar region: Secondary | ICD-10-CM | POA: Diagnosis not present

## 2018-05-15 DIAGNOSIS — M9901 Segmental and somatic dysfunction of cervical region: Secondary | ICD-10-CM | POA: Diagnosis not present

## 2018-05-15 DIAGNOSIS — S134XXA Sprain of ligaments of cervical spine, initial encounter: Secondary | ICD-10-CM | POA: Diagnosis not present

## 2018-05-20 DIAGNOSIS — M5116 Intervertebral disc disorders with radiculopathy, lumbar region: Secondary | ICD-10-CM | POA: Diagnosis not present

## 2018-05-20 DIAGNOSIS — M9902 Segmental and somatic dysfunction of thoracic region: Secondary | ICD-10-CM | POA: Diagnosis not present

## 2018-05-20 DIAGNOSIS — S335XXA Sprain of ligaments of lumbar spine, initial encounter: Secondary | ICD-10-CM | POA: Diagnosis not present

## 2018-05-20 DIAGNOSIS — M542 Cervicalgia: Secondary | ICD-10-CM | POA: Diagnosis not present

## 2018-05-20 DIAGNOSIS — M62830 Muscle spasm of back: Secondary | ICD-10-CM | POA: Diagnosis not present

## 2018-05-20 DIAGNOSIS — M9903 Segmental and somatic dysfunction of lumbar region: Secondary | ICD-10-CM | POA: Diagnosis not present

## 2018-05-20 DIAGNOSIS — M9901 Segmental and somatic dysfunction of cervical region: Secondary | ICD-10-CM | POA: Diagnosis not present

## 2018-05-20 DIAGNOSIS — S134XXA Sprain of ligaments of cervical spine, initial encounter: Secondary | ICD-10-CM | POA: Diagnosis not present

## 2018-05-21 DIAGNOSIS — M5116 Intervertebral disc disorders with radiculopathy, lumbar region: Secondary | ICD-10-CM | POA: Diagnosis not present

## 2018-05-21 DIAGNOSIS — M542 Cervicalgia: Secondary | ICD-10-CM | POA: Diagnosis not present

## 2018-05-21 DIAGNOSIS — M9901 Segmental and somatic dysfunction of cervical region: Secondary | ICD-10-CM | POA: Diagnosis not present

## 2018-05-21 DIAGNOSIS — M9902 Segmental and somatic dysfunction of thoracic region: Secondary | ICD-10-CM | POA: Diagnosis not present

## 2018-05-21 DIAGNOSIS — S335XXA Sprain of ligaments of lumbar spine, initial encounter: Secondary | ICD-10-CM | POA: Diagnosis not present

## 2018-05-21 DIAGNOSIS — M62830 Muscle spasm of back: Secondary | ICD-10-CM | POA: Diagnosis not present

## 2018-05-21 DIAGNOSIS — S134XXA Sprain of ligaments of cervical spine, initial encounter: Secondary | ICD-10-CM | POA: Diagnosis not present

## 2018-05-21 DIAGNOSIS — M9903 Segmental and somatic dysfunction of lumbar region: Secondary | ICD-10-CM | POA: Diagnosis not present

## 2018-05-22 DIAGNOSIS — M542 Cervicalgia: Secondary | ICD-10-CM | POA: Diagnosis not present

## 2018-05-22 DIAGNOSIS — M9901 Segmental and somatic dysfunction of cervical region: Secondary | ICD-10-CM | POA: Diagnosis not present

## 2018-05-22 DIAGNOSIS — S134XXA Sprain of ligaments of cervical spine, initial encounter: Secondary | ICD-10-CM | POA: Diagnosis not present

## 2018-05-22 DIAGNOSIS — M5116 Intervertebral disc disorders with radiculopathy, lumbar region: Secondary | ICD-10-CM | POA: Diagnosis not present

## 2018-05-22 DIAGNOSIS — M9903 Segmental and somatic dysfunction of lumbar region: Secondary | ICD-10-CM | POA: Diagnosis not present

## 2018-05-22 DIAGNOSIS — S335XXA Sprain of ligaments of lumbar spine, initial encounter: Secondary | ICD-10-CM | POA: Diagnosis not present

## 2018-05-22 DIAGNOSIS — M62830 Muscle spasm of back: Secondary | ICD-10-CM | POA: Diagnosis not present

## 2018-05-22 DIAGNOSIS — M9902 Segmental and somatic dysfunction of thoracic region: Secondary | ICD-10-CM | POA: Diagnosis not present

## 2018-05-23 DIAGNOSIS — L739 Follicular disorder, unspecified: Secondary | ICD-10-CM | POA: Diagnosis not present

## 2018-05-23 DIAGNOSIS — L821 Other seborrheic keratosis: Secondary | ICD-10-CM | POA: Diagnosis not present

## 2018-05-27 DIAGNOSIS — M5116 Intervertebral disc disorders with radiculopathy, lumbar region: Secondary | ICD-10-CM | POA: Diagnosis not present

## 2018-05-27 DIAGNOSIS — M9903 Segmental and somatic dysfunction of lumbar region: Secondary | ICD-10-CM | POA: Diagnosis not present

## 2018-05-27 DIAGNOSIS — M542 Cervicalgia: Secondary | ICD-10-CM | POA: Diagnosis not present

## 2018-05-27 DIAGNOSIS — M62830 Muscle spasm of back: Secondary | ICD-10-CM | POA: Diagnosis not present

## 2018-05-27 DIAGNOSIS — S335XXA Sprain of ligaments of lumbar spine, initial encounter: Secondary | ICD-10-CM | POA: Diagnosis not present

## 2018-05-27 DIAGNOSIS — M9902 Segmental and somatic dysfunction of thoracic region: Secondary | ICD-10-CM | POA: Diagnosis not present

## 2018-05-27 DIAGNOSIS — M9901 Segmental and somatic dysfunction of cervical region: Secondary | ICD-10-CM | POA: Diagnosis not present

## 2018-05-27 DIAGNOSIS — S134XXA Sprain of ligaments of cervical spine, initial encounter: Secondary | ICD-10-CM | POA: Diagnosis not present

## 2018-05-29 DIAGNOSIS — M5116 Intervertebral disc disorders with radiculopathy, lumbar region: Secondary | ICD-10-CM | POA: Diagnosis not present

## 2018-05-29 DIAGNOSIS — M9901 Segmental and somatic dysfunction of cervical region: Secondary | ICD-10-CM | POA: Diagnosis not present

## 2018-05-29 DIAGNOSIS — M62830 Muscle spasm of back: Secondary | ICD-10-CM | POA: Diagnosis not present

## 2018-05-29 DIAGNOSIS — S134XXA Sprain of ligaments of cervical spine, initial encounter: Secondary | ICD-10-CM | POA: Diagnosis not present

## 2018-05-29 DIAGNOSIS — M9903 Segmental and somatic dysfunction of lumbar region: Secondary | ICD-10-CM | POA: Diagnosis not present

## 2018-05-29 DIAGNOSIS — S335XXA Sprain of ligaments of lumbar spine, initial encounter: Secondary | ICD-10-CM | POA: Diagnosis not present

## 2018-05-29 DIAGNOSIS — M542 Cervicalgia: Secondary | ICD-10-CM | POA: Diagnosis not present

## 2018-05-29 DIAGNOSIS — M9902 Segmental and somatic dysfunction of thoracic region: Secondary | ICD-10-CM | POA: Diagnosis not present

## 2018-05-30 NOTE — Progress Notes (Signed)
Cardiology Office Note:    Date:  05/31/2018   ID:  April Munoz, DOB 27-May-1951, MRN [id number]  PCP:  April Munoz., MD  Cardiologist:  Shirlee More, MD    Referring MD: April Munoz., MD    ASSESSMENT:    1. Paroxysmal atrial fibrillation (HCC)   2. Hypertensive heart disease without heart failure   3. Chronic anticoagulation   4. High risk medication use    PLAN:    In order of problems listed above:  1. Stable should continue beta-blocker and her current anticoagulant.  She is made a decision not to be anticoagulated I did tell her that if she was capturing rapid rhythms at home I strongly encouraged her to reconsider her decision and if hesitant to take long-term consider watchman device 2. BP is above target repeat by me 142/80 we will switch to a more potent antihypertensive beta-blocker bysystolic 3. She is not on anticoagulants takes low-dose aspirin 4. Continue her antiarrhythmic drug   Next appointment: 6 months   Medication Adjustments/Labs and Tests Ordered: Current medicines are reviewed at length with the patient today.  Concerns regarding medicines are outlined above.  No orders of the defined types were placed in this encounter.  No orders of the defined types were placed in this encounter.   Chief Complaint  Patient presents with  . Follow-up  . Atrial Fibrillation    History of Present Illness:    April Munoz is a 67 y.o. female with a hx of PAF with CHADS2 vasc of 4 on propafenone,  hypertension  and T2 DM . She utilized a 30 day monitor and noted several episodes of atrial flutter and atrial fibrillation prior to use of flecainide. Echocardiogram showed preserved left ventricular ejection fraction with moderate left atrial enlargement and no valvular heart disease  He was last seen 11/08/2017. Compliance with diet, lifestyle and medications: Yes  Overall she is doing well on her own she is decided to take propafenone 1 time a day and has had no  breakthrough episodes of atrial fibrillation or flutter.  She made decision not to initiate an anticoagulant were removed from her medication list and takes aspirin for stroke prophylaxis.  She does not have a device for screening heart rate and blood pressure I asked her yearly by pulse meter or BP cuff screen several times we can contact us if she is having rapid heart rhythm.  Her primary complaint is 1 of fatigue and malaise she thinks is related to metoprolol her blood pressure is above target repeat 142/81 we will do is transition from Toprol to by systolic.  Prior to leaving the office we will do an EKG no chest pain shortness of breath syncope TIA. Past Medical History:  Diagnosis Date  . Arthritis   . B12 deficiency 10/31/2017  . Cellulitis of abdominal wall 07/07/2016  . Depression   . Diabetes mellitus without complication ([place])   . Esophageal stricture 06/05/2017  . Essential hypertension 02/24/2013  . Fibromyalgia 02/24/2013  . Full dentures   . Hereditary and idiopathic peripheral neuropathy 02/24/2013  . Hyperlipidemia 02/24/2013  . Hypertension   . Murmur, cardiac 08/15/2015  . Neuropathy in diabetes ([place]) 10/31/2017  . Osteoporosis   . Paroxysmal atrial fibrillation ([place]) 08/15/2015   CHADS2vasc=3 CHADS2vasc=3  . Restless legs syndrome 03/14/2013  . Thoracic or lumbosacral neuritis or radiculitis 02/24/2013  . Type 2 diabetes mellitus, without long-term current use of insulin ([place]) 02/24/2013  . Wears glasses  Past Surgical History:  Procedure Laterality Date  . CATARACT EXTRACTION, BILATERAL    . CHOLECYSTECTOMY  1971   open  . DIAGNOSTIC LAPAROSCOPY  2010   lysis of adhesions  . DILATION AND CURETTAGE OF UTERUS  2004  . KNEE ARTHROSCOPY  1998   left  . KNEE ARTHROSCOPY W/ LATERAL RETINACULAR REPAIR    . MASS EXCISION Left 11/11/2013   Procedure: EXCISION MUCOID CYST LEFT INDEX FINGER/DEBRIDEMENT LEFT INDEX FINGER;  Surgeon: Wynonia Sours, MD;  Location: [place];  Service: Orthopedics;  Laterality: Left;  ANESTHESIA: IV REGIONAL/FAB  . SHOULDER ARTHROSCOPY W/ ROTATOR CUFF REPAIR  2007   left  . TRIGGER FINGER RELEASE Left 11/11/2013   Procedure: RELEASE A-1 PULLEY LEFT RING FINGER;  Surgeon: Wynonia Sours, MD;  Location: [place];  Service: Orthopedics;  Laterality: Left;  . UMBILICAL HERNIA REPAIR  2008    Current Medications: Current Meds  Medication Sig  . aspirin 81 MG tablet Take 81 mg by mouth daily.  Marland Kitchen b complex vitamins tablet Take 1 tablet by mouth daily.  . cyanocobalamin (,VITAMIN B-12,) 1000 MCG/ML injection Inject 1,000 mcg into the muscle every 30 (thirty) days.  . DULoxetine (CYMBALTA) 60 MG capsule Take 60 mg by mouth 2 (two) times daily.  Marland Kitchen gabapentin (NEURONTIN) 300 MG capsule Take 300 mg by mouth daily.  . metFORMIN (GLUCOPHAGE) 1000 MG tablet Take 1,000 mg by mouth 2 (two) times daily with a meal.  . metoprolol succinate (TOPROL-XL) 50 MG 24 hr tablet Take 50 mg by mouth every evening.   . Probiotic Product (PROBIOTIC & ACIDOPHILUS EX ST PO) Take 1 tablet by mouth daily.   . propafenone (RYTHMOL) 150 MG tablet Take 1 tablet (150 mg total) by mouth 2 (two) times daily. (Patient taking differently: Take 150 mg by mouth daily. )  . torsemide (DEMADEX) 20 MG tablet Take 20 mg by mouth daily.     Allergies:   Prednisone   Social History   Socioeconomic History  . Marital status: Married    Spouse name: Not on file  . Number of children: Not on file  . Years of education: Not on file  . Highest education level: Not on file  Occupational History  . Not on file  Social Needs  . Financial resource strain: Not on file  . Food insecurity:    Worry: Not on file    Inability: Not on file  . Transportation needs:    Medical: Not on file    Non-medical: Not on file  Tobacco Use  . Smoking status: Never Smoker  . Smokeless tobacco: Never Used  Substance and Sexual Activity  . Alcohol use: No     Comment: very rare  . Drug use: No  . Sexual activity: Not on file  Lifestyle  . Physical activity:    Days per week: Not on file    Minutes per session: Not on file  . Stress: Not on file  Relationships  . Social connections:    Talks on phone: Not on file    Gets together: Not on file    Attends religious service: Not on file    Active member of club or organization: Not on file    Attends meetings of clubs or organizations: Not on file    Relationship status: Not on file  Other Topics Concern  . Not on file  Social History Narrative  . Not on file  Family History: The patient's family history includes Heart attack in her brother; Heart disease in her brother; Heart failure in her father and paternal grandfather. ROS:   Please see the history of present illness.    All other systems reviewed and are negative.  EKGs/Labs/Other Studies Reviewed:    The following studies were reviewed today:  EKG:  EKG ordered today.  The ekg ordered today demonstrates   Recent Labs: No results found for requested labs within last 8760 hours.  Recent Lipid Panel No results found for: CHOL, TRIG, HDL, CHOLHDL, VLDL, LDLCALC, LDLDIRECT  Physical Exam:    VS:  BP (!) 162/92 (BP Location: Right Arm, Patient Position: Sitting, Cuff Size: Normal)   Pulse 65   Ht 5\' 7"  (1.702 m)   Wt 201 lb 6.4 oz (91.4 kg)   SpO2 91%   BMI 31.54 kg/m     Wt Readings from Last 3 Encounters:  05/31/18 201 lb 6.4 oz (91.4 kg)  11/08/17 197 lb (89.4 kg)  11/11/13 211 lb (95.7 kg)     GEN:  Well nourished, well developed in no acute distress HEENT: Normal NECK: No JVD; No carotid bruits LYMPHATICS: No lymphadenopathy CARDIAC: RRR, no murmurs, rubs, gallops RESPIRATORY:  Clear to auscultation without rales, wheezing or rhonchi  ABDOMEN: Soft, non-tender, non-distended MUSCULOSKELETAL:  No edema; No deformity  SKIN: Warm and dry NEUROLOGIC:  Alert and oriented x 3 PSYCHIATRIC:  Normal affect     Signed, Shirlee More, MD  05/31/2018 8:43 AM    [place]

## 2018-05-31 ENCOUNTER — Ambulatory Visit (INDEPENDENT_AMBULATORY_CARE_PROVIDER_SITE_OTHER): Payer: Medicare Other | Admitting: Cardiology

## 2018-05-31 ENCOUNTER — Encounter: Payer: Self-pay | Admitting: Cardiology

## 2018-05-31 VITALS — BP 162/92 | HR 65 | Ht 67.0 in | Wt 201.4 lb

## 2018-05-31 DIAGNOSIS — I480 Paroxysmal atrial fibrillation: Secondary | ICD-10-CM | POA: Diagnosis not present

## 2018-05-31 DIAGNOSIS — I119 Hypertensive heart disease without heart failure: Secondary | ICD-10-CM

## 2018-05-31 DIAGNOSIS — Z7901 Long term (current) use of anticoagulants: Secondary | ICD-10-CM | POA: Insufficient documentation

## 2018-05-31 DIAGNOSIS — Z79899 Other long term (current) drug therapy: Secondary | ICD-10-CM

## 2018-05-31 HISTORY — DX: Long term (current) use of anticoagulants: Z79.01

## 2018-05-31 MED ORDER — NEBIVOLOL HCL 5 MG PO TABS: 5.0000 mg | ORAL_TABLET | Freq: Every day | ORAL | 1 refills | Status: DC

## 2018-05-31 NOTE — Patient Instructions (Signed)
  Medication Instructions:  Your physician has recommended you make the following change in your medication:    STOP: Toprol START: Bystolic 5mg  one tablet by mouth daily  You should purchase a home BP cuff or pulse meter to check your pulse 2 - 3 times per week.   If you need a refill on your cardiac medications before your next appointment, please call your pharmacy.   Lab work: NONE   If you have labs (blood work) drawn today and your tests are completely normal, you will receive your results only by: Marland Kitchen MyChart Message (if you have MyChart) OR . A paper copy in the mail If you have any lab test that is abnormal or we need to change your treatment, we will call you to review the results.  Testing/Procedures: You had an EKG today  Follow-Up: At [facility], you and your health needs are our priority.  As part of our continuing mission to provide you with exceptional heart care, we have created designated Provider Care Teams.  These Care Teams include your primary Cardiologist (physician) and Advanced Practice Providers (APPs -  Physician Assistants and Nurse Practitioners) who all work together to provide you with the care you need, when you need it.    You will need a follow up appointment in 6 months.  Please call our office 2 months in advance to schedule this appointment.     If you need a refill on your cardiac medications before your next appointment, please call your pharmacy.

## 2018-06-03 DIAGNOSIS — M5116 Intervertebral disc disorders with radiculopathy, lumbar region: Secondary | ICD-10-CM | POA: Diagnosis not present

## 2018-06-03 DIAGNOSIS — M9902 Segmental and somatic dysfunction of thoracic region: Secondary | ICD-10-CM | POA: Diagnosis not present

## 2018-06-03 DIAGNOSIS — S134XXA Sprain of ligaments of cervical spine, initial encounter: Secondary | ICD-10-CM | POA: Diagnosis not present

## 2018-06-03 DIAGNOSIS — S335XXA Sprain of ligaments of lumbar spine, initial encounter: Secondary | ICD-10-CM | POA: Diagnosis not present

## 2018-06-03 DIAGNOSIS — M9901 Segmental and somatic dysfunction of cervical region: Secondary | ICD-10-CM | POA: Diagnosis not present

## 2018-06-03 DIAGNOSIS — M9903 Segmental and somatic dysfunction of lumbar region: Secondary | ICD-10-CM | POA: Diagnosis not present

## 2018-06-03 DIAGNOSIS — M542 Cervicalgia: Secondary | ICD-10-CM | POA: Diagnosis not present

## 2018-06-03 DIAGNOSIS — M62830 Muscle spasm of back: Secondary | ICD-10-CM | POA: Diagnosis not present

## 2018-06-04 DIAGNOSIS — E1169 Type 2 diabetes mellitus with other specified complication: Secondary | ICD-10-CM | POA: Diagnosis not present

## 2018-06-04 DIAGNOSIS — I480 Paroxysmal atrial fibrillation: Secondary | ICD-10-CM | POA: Diagnosis not present

## 2018-06-04 DIAGNOSIS — E668 Other obesity: Secondary | ICD-10-CM | POA: Diagnosis not present

## 2018-06-04 DIAGNOSIS — E034 Atrophy of thyroid (acquired): Secondary | ICD-10-CM | POA: Diagnosis not present

## 2018-06-04 DIAGNOSIS — Z6832 Body mass index (BMI) 32.0-32.9, adult: Secondary | ICD-10-CM | POA: Diagnosis not present

## 2018-06-04 DIAGNOSIS — I10 Essential (primary) hypertension: Secondary | ICD-10-CM | POA: Diagnosis not present

## 2018-06-04 DIAGNOSIS — E782 Mixed hyperlipidemia: Secondary | ICD-10-CM | POA: Diagnosis not present

## 2018-06-04 DIAGNOSIS — F331 Major depressive disorder, recurrent, moderate: Secondary | ICD-10-CM | POA: Diagnosis not present

## 2018-06-05 DIAGNOSIS — S335XXA Sprain of ligaments of lumbar spine, initial encounter: Secondary | ICD-10-CM | POA: Diagnosis not present

## 2018-06-05 DIAGNOSIS — M9903 Segmental and somatic dysfunction of lumbar region: Secondary | ICD-10-CM | POA: Diagnosis not present

## 2018-06-05 DIAGNOSIS — M62830 Muscle spasm of back: Secondary | ICD-10-CM | POA: Diagnosis not present

## 2018-06-05 DIAGNOSIS — E1142 Type 2 diabetes mellitus with diabetic polyneuropathy: Secondary | ICD-10-CM | POA: Diagnosis not present

## 2018-06-05 DIAGNOSIS — I10 Essential (primary) hypertension: Secondary | ICD-10-CM | POA: Diagnosis not present

## 2018-06-05 DIAGNOSIS — M5116 Intervertebral disc disorders with radiculopathy, lumbar region: Secondary | ICD-10-CM | POA: Diagnosis not present

## 2018-06-05 DIAGNOSIS — S134XXA Sprain of ligaments of cervical spine, initial encounter: Secondary | ICD-10-CM | POA: Diagnosis not present

## 2018-06-05 DIAGNOSIS — E782 Mixed hyperlipidemia: Secondary | ICD-10-CM | POA: Diagnosis not present

## 2018-06-05 DIAGNOSIS — M542 Cervicalgia: Secondary | ICD-10-CM | POA: Diagnosis not present

## 2018-06-05 DIAGNOSIS — M9901 Segmental and somatic dysfunction of cervical region: Secondary | ICD-10-CM | POA: Diagnosis not present

## 2018-06-05 DIAGNOSIS — M9902 Segmental and somatic dysfunction of thoracic region: Secondary | ICD-10-CM | POA: Diagnosis not present

## 2018-06-11 DIAGNOSIS — M5116 Intervertebral disc disorders with radiculopathy, lumbar region: Secondary | ICD-10-CM | POA: Diagnosis not present

## 2018-06-11 DIAGNOSIS — M542 Cervicalgia: Secondary | ICD-10-CM | POA: Diagnosis not present

## 2018-06-11 DIAGNOSIS — S335XXA Sprain of ligaments of lumbar spine, initial encounter: Secondary | ICD-10-CM | POA: Diagnosis not present

## 2018-06-11 DIAGNOSIS — M9902 Segmental and somatic dysfunction of thoracic region: Secondary | ICD-10-CM | POA: Diagnosis not present

## 2018-06-11 DIAGNOSIS — M9903 Segmental and somatic dysfunction of lumbar region: Secondary | ICD-10-CM | POA: Diagnosis not present

## 2018-06-11 DIAGNOSIS — M9901 Segmental and somatic dysfunction of cervical region: Secondary | ICD-10-CM | POA: Diagnosis not present

## 2018-06-11 DIAGNOSIS — S134XXA Sprain of ligaments of cervical spine, initial encounter: Secondary | ICD-10-CM | POA: Diagnosis not present

## 2018-06-12 ENCOUNTER — Telehealth: Payer: Self-pay | Admitting: Cardiology

## 2018-06-12 NOTE — Telephone Encounter (Signed)
That new BP med isn't keeping her BP down

## 2018-06-12 NOTE — Telephone Encounter (Signed)
Patient called today stating that since she has started on Bystolic at last office visit "she is feeling bad."  She states that she checked her blood pressure today and it was 156/97.  Patient states this is the only recording that he has documented, but some of the other readings have been "high as well."  Patient advised to continue monitoring her blood pressure and documenting the results.  She will take blood pressure readings at the same time each day.  Patient advised to call our office next week to give blood pressure readings by phone.  Patient agreed to plan and verbalized understanding.

## 2018-06-20 DIAGNOSIS — Z0000 Encounter for general adult medical examination without abnormal findings: Secondary | ICD-10-CM | POA: Diagnosis not present

## 2018-06-20 DIAGNOSIS — Z1231 Encounter for screening mammogram for malignant neoplasm of breast: Secondary | ICD-10-CM | POA: Diagnosis not present

## 2018-06-20 DIAGNOSIS — Z13820 Encounter for screening for osteoporosis: Secondary | ICD-10-CM | POA: Diagnosis not present

## 2018-06-20 DIAGNOSIS — Z1211 Encounter for screening for malignant neoplasm of colon: Secondary | ICD-10-CM | POA: Diagnosis not present

## 2018-06-20 DIAGNOSIS — Z6832 Body mass index (BMI) 32.0-32.9, adult: Secondary | ICD-10-CM | POA: Diagnosis not present

## 2018-06-25 DIAGNOSIS — I4819 Other persistent atrial fibrillation: Secondary | ICD-10-CM | POA: Diagnosis not present

## 2018-06-25 DIAGNOSIS — F331 Major depressive disorder, recurrent, moderate: Secondary | ICD-10-CM | POA: Diagnosis not present

## 2018-06-25 DIAGNOSIS — S0012XA Contusion of left eyelid and periocular area, initial encounter: Secondary | ICD-10-CM | POA: Diagnosis not present

## 2018-06-25 DIAGNOSIS — S0990XA Unspecified injury of head, initial encounter: Secondary | ICD-10-CM | POA: Diagnosis not present

## 2018-06-25 DIAGNOSIS — R4182 Altered mental status, unspecified: Secondary | ICD-10-CM | POA: Diagnosis not present

## 2018-06-25 DIAGNOSIS — E038 Other specified hypothyroidism: Secondary | ICD-10-CM | POA: Diagnosis not present

## 2018-06-25 DIAGNOSIS — E1169 Type 2 diabetes mellitus with other specified complication: Secondary | ICD-10-CM | POA: Diagnosis not present

## 2018-06-25 DIAGNOSIS — I10 Essential (primary) hypertension: Secondary | ICD-10-CM | POA: Diagnosis not present

## 2018-06-25 DIAGNOSIS — R410 Disorientation, unspecified: Secondary | ICD-10-CM | POA: Diagnosis not present

## 2018-06-25 DIAGNOSIS — S0993XA Unspecified injury of face, initial encounter: Secondary | ICD-10-CM | POA: Diagnosis not present

## 2018-06-25 DIAGNOSIS — E782 Mixed hyperlipidemia: Secondary | ICD-10-CM | POA: Diagnosis not present

## 2018-07-01 ENCOUNTER — Telehealth: Payer: Self-pay | Admitting: Cardiology

## 2018-07-01 MED ORDER — APIXABAN 5 MG PO TABS: 5.0000 mg | ORAL_TABLET | Freq: Two times a day (BID) | ORAL | 5 refills | Status: DC

## 2018-07-01 NOTE — Telephone Encounter (Signed)
Decided she wants to go on blood thinner

## 2018-07-01 NOTE — Telephone Encounter (Signed)
Patient notified per Dr Bettina Gavia to stop aspirin and start Eliquis 5mg  one tablet twice daily.  Rx sent to [facility] as requested by patient.  Patient agrees to plan and verbalized understanding.

## 2018-07-01 NOTE — Addendum Note (Signed)
Addended by: Stevan Born on: 07/01/2018 03:13 PM   Modules accepted: Orders

## 2018-07-01 NOTE — Telephone Encounter (Signed)
Stop aspirin  eliquis 5 mg BID # 60 refill 5

## 2018-07-01 NOTE — Telephone Encounter (Signed)
Patient has decided that she would like to go on an anticoagulation medication.  She has not noticed any episodes of atrial fibrillation, but thinks it would be the best choice at this time.  Please advise.

## 2018-07-18 LAB — HM DEXA SCAN

## 2018-07-26 DIAGNOSIS — R260 Ataxic gait: Secondary | ICD-10-CM | POA: Diagnosis not present

## 2018-07-26 DIAGNOSIS — F331 Major depressive disorder, recurrent, moderate: Secondary | ICD-10-CM | POA: Diagnosis not present

## 2018-07-26 DIAGNOSIS — E038 Other specified hypothyroidism: Secondary | ICD-10-CM | POA: Diagnosis not present

## 2018-07-26 DIAGNOSIS — R413 Other amnesia: Secondary | ICD-10-CM | POA: Diagnosis not present

## 2018-07-26 DIAGNOSIS — I10 Essential (primary) hypertension: Secondary | ICD-10-CM | POA: Diagnosis not present

## 2018-07-29 DIAGNOSIS — R1310 Dysphagia, unspecified: Secondary | ICD-10-CM | POA: Diagnosis not present

## 2018-07-29 DIAGNOSIS — K2270 Barrett's esophagus without dysplasia: Secondary | ICD-10-CM | POA: Diagnosis not present

## 2018-07-29 DIAGNOSIS — K589 Irritable bowel syndrome without diarrhea: Secondary | ICD-10-CM | POA: Diagnosis not present

## 2018-07-30 DIAGNOSIS — E038 Other specified hypothyroidism: Secondary | ICD-10-CM | POA: Diagnosis not present

## 2018-08-01 DIAGNOSIS — D3502 Benign neoplasm of left adrenal gland: Secondary | ICD-10-CM | POA: Diagnosis not present

## 2018-08-01 DIAGNOSIS — D3501 Benign neoplasm of right adrenal gland: Secondary | ICD-10-CM | POA: Diagnosis not present

## 2018-08-01 DIAGNOSIS — K449 Diaphragmatic hernia without obstruction or gangrene: Secondary | ICD-10-CM | POA: Diagnosis not present

## 2018-08-01 DIAGNOSIS — Z9884 Bariatric surgery status: Secondary | ICD-10-CM | POA: Diagnosis not present

## 2018-08-01 DIAGNOSIS — I700 Atherosclerosis of aorta: Secondary | ICD-10-CM | POA: Diagnosis not present

## 2018-08-01 DIAGNOSIS — Z01812 Encounter for preprocedural laboratory examination: Secondary | ICD-10-CM | POA: Diagnosis not present

## 2018-08-01 DIAGNOSIS — R1030 Lower abdominal pain, unspecified: Secondary | ICD-10-CM | POA: Diagnosis not present

## 2018-08-01 DIAGNOSIS — R112 Nausea with vomiting, unspecified: Secondary | ICD-10-CM | POA: Diagnosis not present

## 2018-08-01 DIAGNOSIS — R1110 Vomiting, unspecified: Secondary | ICD-10-CM | POA: Diagnosis not present

## 2018-08-01 DIAGNOSIS — Z8719 Personal history of other diseases of the digestive system: Secondary | ICD-10-CM | POA: Diagnosis not present

## 2018-08-01 DIAGNOSIS — S2242XD Multiple fractures of ribs, left side, subsequent encounter for fracture with routine healing: Secondary | ICD-10-CM | POA: Diagnosis not present

## 2018-08-05 DIAGNOSIS — M85851 Other specified disorders of bone density and structure, right thigh: Secondary | ICD-10-CM | POA: Diagnosis not present

## 2018-08-05 DIAGNOSIS — N959 Unspecified menopausal and perimenopausal disorder: Secondary | ICD-10-CM | POA: Diagnosis not present

## 2018-08-05 DIAGNOSIS — M810 Age-related osteoporosis without current pathological fracture: Secondary | ICD-10-CM | POA: Diagnosis not present

## 2018-08-05 DIAGNOSIS — Z1231 Encounter for screening mammogram for malignant neoplasm of breast: Secondary | ICD-10-CM | POA: Diagnosis not present

## 2018-08-05 LAB — HM MAMMOGRAPHY: HM Mammogram: NORMAL (ref 0–4)

## 2018-08-06 DIAGNOSIS — R413 Other amnesia: Secondary | ICD-10-CM | POA: Diagnosis not present

## 2018-08-06 DIAGNOSIS — R42 Dizziness and giddiness: Secondary | ICD-10-CM | POA: Diagnosis not present

## 2018-08-06 DIAGNOSIS — R260 Ataxic gait: Secondary | ICD-10-CM | POA: Diagnosis not present

## 2018-08-06 DIAGNOSIS — R51 Headache: Secondary | ICD-10-CM | POA: Diagnosis not present

## 2018-08-09 DIAGNOSIS — E079 Disorder of thyroid, unspecified: Secondary | ICD-10-CM | POA: Diagnosis not present

## 2018-08-09 DIAGNOSIS — D649 Anemia, unspecified: Secondary | ICD-10-CM | POA: Diagnosis not present

## 2018-08-09 DIAGNOSIS — R1011 Right upper quadrant pain: Secondary | ICD-10-CM | POA: Diagnosis not present

## 2018-08-09 DIAGNOSIS — R1310 Dysphagia, unspecified: Secondary | ICD-10-CM | POA: Diagnosis not present

## 2018-08-09 DIAGNOSIS — Z79899 Other long term (current) drug therapy: Secondary | ICD-10-CM | POA: Diagnosis not present

## 2018-08-09 DIAGNOSIS — Z9884 Bariatric surgery status: Secondary | ICD-10-CM | POA: Diagnosis not present

## 2018-08-09 DIAGNOSIS — E7800 Pure hypercholesterolemia, unspecified: Secondary | ICD-10-CM | POA: Diagnosis not present

## 2018-08-09 DIAGNOSIS — K229 Disease of esophagus, unspecified: Secondary | ICD-10-CM | POA: Diagnosis not present

## 2018-08-09 DIAGNOSIS — I10 Essential (primary) hypertension: Secondary | ICD-10-CM | POA: Diagnosis not present

## 2018-08-09 DIAGNOSIS — M1990 Unspecified osteoarthritis, unspecified site: Secondary | ICD-10-CM | POA: Diagnosis not present

## 2018-08-09 DIAGNOSIS — K2270 Barrett's esophagus without dysplasia: Secondary | ICD-10-CM | POA: Diagnosis not present

## 2018-08-09 DIAGNOSIS — E119 Type 2 diabetes mellitus without complications: Secondary | ICD-10-CM | POA: Diagnosis not present

## 2018-08-09 DIAGNOSIS — K210 Gastro-esophageal reflux disease with esophagitis: Secondary | ICD-10-CM | POA: Diagnosis not present

## 2018-08-09 DIAGNOSIS — Z7901 Long term (current) use of anticoagulants: Secondary | ICD-10-CM | POA: Diagnosis not present

## 2018-08-09 DIAGNOSIS — I4891 Unspecified atrial fibrillation: Secondary | ICD-10-CM | POA: Diagnosis not present

## 2018-08-09 DIAGNOSIS — Z8719 Personal history of other diseases of the digestive system: Secondary | ICD-10-CM | POA: Diagnosis not present

## 2018-08-09 DIAGNOSIS — Z7984 Long term (current) use of oral hypoglycemic drugs: Secondary | ICD-10-CM | POA: Diagnosis not present

## 2018-08-22 DIAGNOSIS — F331 Major depressive disorder, recurrent, moderate: Secondary | ICD-10-CM | POA: Diagnosis not present

## 2018-08-22 DIAGNOSIS — I4819 Other persistent atrial fibrillation: Secondary | ICD-10-CM | POA: Diagnosis not present

## 2018-08-22 DIAGNOSIS — E559 Vitamin D deficiency, unspecified: Secondary | ICD-10-CM | POA: Diagnosis not present

## 2018-08-22 DIAGNOSIS — E038 Other specified hypothyroidism: Secondary | ICD-10-CM | POA: Diagnosis not present

## 2018-08-22 DIAGNOSIS — I10 Essential (primary) hypertension: Secondary | ICD-10-CM | POA: Diagnosis not present

## 2018-09-04 DIAGNOSIS — K2270 Barrett's esophagus without dysplasia: Secondary | ICD-10-CM | POA: Diagnosis not present

## 2018-09-04 DIAGNOSIS — K219 Gastro-esophageal reflux disease without esophagitis: Secondary | ICD-10-CM | POA: Diagnosis not present

## 2018-09-11 ENCOUNTER — Ambulatory Visit (INDEPENDENT_AMBULATORY_CARE_PROVIDER_SITE_OTHER): Payer: Medicare Other | Admitting: Diagnostic Neuroimaging

## 2018-09-11 ENCOUNTER — Encounter: Payer: Self-pay | Admitting: Diagnostic Neuroimaging

## 2018-09-11 VITALS — BP 164/91 | HR 66 | Ht 67.0 in | Wt 194.0 lb

## 2018-09-11 DIAGNOSIS — G459 Transient cerebral ischemic attack, unspecified: Secondary | ICD-10-CM

## 2018-09-11 DIAGNOSIS — R269 Unspecified abnormalities of gait and mobility: Secondary | ICD-10-CM

## 2018-09-11 DIAGNOSIS — G4700 Insomnia, unspecified: Secondary | ICD-10-CM | POA: Diagnosis not present

## 2018-09-11 DIAGNOSIS — R413 Other amnesia: Secondary | ICD-10-CM | POA: Diagnosis not present

## 2018-09-11 NOTE — Progress Notes (Signed)
[facility]  PATIENT: April Munoz DOB: February 19, 1951  REFERRING CLINICIAN: K Cox HISTORY FROM: patient  REASON FOR VISIT: new consult    HISTORICAL  CHIEF COMPLAINT:  Chief Complaint  Patient presents with  . New Patient (Initial Visit)    Referred by Dr. Dirk Dress  . Ataxia/ imbalance/ Falls    Rm 7, husband.      HISTORY OF PRESENT ILLNESS:   68 year old female here for evaluation of memory loss, balance difficulty, insomnia, anxiety. Patient has hypertension, diabetes, hypercholesterolemia, atrial fibrillation, depression, anxiety, fibromyalgia.  Patient was diagnosed with atrial fibrillation 3 years ago.  She was recommended to start anticoagulation but she has not been able to due to financial limitations.  She does not want to take Coumadin.  She was prescribed Eliquis but cannot afford it.  Over the past 1 year she has had at least 10-12 episodes of transient slurred speech and trouble talking.  Sometimes these are associated with staggering gait.  Episodes last for few minutes at a time.  She has not gone to the emergency room or had evaluation of these in the past.  Patient also has a gradual onset and progressive generalized balance difficulty, with numbness in her feet.  She feels like her toes are weak.  She has been diagnosed with diabetic peripheral neuropathy.  She has numbness and tingling in her feet.  She has been on gabapentin in the past.  Also with chronic anxiety and insomnia since childhood.  She has seen a therapist many years ago but not currently.  She is tried many sleep aids without benefit.   REVIEW OF SYSTEMS: Full 14 system review of systems performed and negative with exception of: Memory loss confusion headache numbness weakness slurred speech dizziness tremor insomnia restless leg depression anxiety racing thoughts allergies joint pain flushing anemia blurred vision loss of vision weight loss fatigue palpitation ringing in ears  spinning sensation trouble swallowing.   ALLERGIES: Allergies  Allergen Reactions  . Prednisone     In high doses causes her to feel crazy    HOME MEDICATIONS: Outpatient Medications Prior to Visit  Medication Sig Dispense Refill  . ALPRAZolam (XANAX) 0.5 MG tablet Take 0.5 mg by mouth at bedtime as needed for anxiety.    Marland Kitchen b complex vitamins tablet Take 1 tablet by mouth daily.    Marland Kitchen BIOTIN PO Take by mouth. 800mg  daily    . calcium gluconate 500 MG tablet Take 1 tablet by mouth daily.    . cyanocobalamin (,VITAMIN B-12,) 1000 MCG/ML injection Inject 1,000 mcg into the muscle every 30 (thirty) days.    Marland Kitchen gabapentin (NEURONTIN) 300 MG capsule Take 300 mg by mouth daily.    . metFORMIN (GLUCOPHAGE) 1000 MG tablet Take 1,000 mg by mouth 2 (two) times daily with a meal.    . metoprolol succinate (TOPROL-XL) 25 MG 24 hr tablet Take 50 mg by mouth daily.    . potassium chloride (K-DUR) 10 MEQ tablet Take 10 mEq by mouth daily.    . Probiotic Product (PROBIOTIC & ACIDOPHILUS EX ST PO) Take 1 tablet by mouth daily.     . propafenone (RYTHMOL) 150 MG tablet Take 1 tablet (150 mg total) by mouth 2 (two) times daily. 180 tablet 3  . torsemide (DEMADEX) 20 MG tablet Take 20 mg by mouth daily.    Marland Kitchen venlafaxine XR (EFFEXOR-XR) 150 MG 24 hr capsule Take 150 mg by mouth daily with breakfast.    . apixaban (ELIQUIS)  5 MG TABS tablet Take 1 tablet (5 mg total) by mouth 2 (two) times daily. (Patient not taking: Reported on 09/11/2018) 60 tablet 5  . DULoxetine (CYMBALTA) 60 MG capsule Take 60 mg by mouth 2 (two) times daily.    . nebivolol (BYSTOLIC) 5 MG tablet Take 1 tablet (5 mg total) by mouth daily. (Patient not taking: Reported on 09/11/2018) 30 tablet 1   No facility-administered medications prior to visit.     PAST MEDICAL HISTORY: Past Medical History:  Diagnosis Date  . Arthritis   . B12 deficiency 10/31/2017  . Cellulitis of abdominal wall 07/07/2016  . Depression   . Diabetes mellitus  without complication ([place])   . Esophageal stricture 06/05/2017  . Essential hypertension 02/24/2013  . Fibromyalgia 02/24/2013  . Full dentures   . Hereditary and idiopathic peripheral neuropathy 02/24/2013  . Hyperlipidemia 02/24/2013  . Hypertension   . Murmur, cardiac 08/15/2015  . Neuropathy in diabetes ([place]) 10/31/2017  . Osteoporosis   . Paroxysmal atrial fibrillation ([place]) 08/15/2015   CHADS2vasc=3 CHADS2vasc=3  . Restless legs syndrome 03/14/2013  . Thoracic or lumbosacral neuritis or radiculitis 02/24/2013  . Type 2 diabetes mellitus, without long-term current use of insulin ([place]) 02/24/2013  . Wears glasses     PAST SURGICAL HISTORY: Past Surgical History:  Procedure Laterality Date  . CATARACT EXTRACTION, BILATERAL    . CHOLECYSTECTOMY  1971   open  . DIAGNOSTIC LAPAROSCOPY  2010   lysis of adhesions  . DILATION AND CURETTAGE OF UTERUS  2004  . KNEE ARTHROSCOPY  1998   left  . KNEE ARTHROSCOPY W/ LATERAL RETINACULAR REPAIR    . MASS EXCISION Left 11/11/2013   Procedure: EXCISION MUCOID CYST LEFT INDEX FINGER/DEBRIDEMENT LEFT INDEX FINGER;  Surgeon: Wynonia Sours, MD;  Location: [place];  Service: Orthopedics;  Laterality: Left;  ANESTHESIA: IV REGIONAL/FAB  . SHOULDER ARTHROSCOPY W/ ROTATOR CUFF REPAIR  2007   left  . TRIGGER FINGER RELEASE Left 11/11/2013   Procedure: RELEASE A-1 PULLEY LEFT RING FINGER;  Surgeon: Wynonia Sours, MD;  Location: [place];  Service: Orthopedics;  Laterality: Left;  . UMBILICAL HERNIA REPAIR  2008    FAMILY HISTORY: Family History  Problem Relation Age of Onset  . Heart failure Father   . Heart disease Brother   . Heart attack Brother   . Heart failure Paternal Grandfather     SOCIAL HISTORY: Social History   Socioeconomic History  . Marital status: Married    Spouse name: Not on file  . Number of children: Not on file  . Years of education: Not on file  . Highest education level: Not on file    Occupational History  . Not on file  Social Needs  . Financial resource strain: Not on file  . Food insecurity:    Worry: Not on file    Inability: Not on file  . Transportation needs:    Medical: Not on file    Non-medical: Not on file  Tobacco Use  . Smoking status: Never Smoker  . Smokeless tobacco: Never Used  Substance and Sexual Activity  . Alcohol use: No    Comment: very rare  . Drug use: No  . Sexual activity: Not on file  Lifestyle  . Physical activity:    Days per week: Not on file    Minutes per session: Not on file  . Stress: Not on file  Relationships  . Social connections:  Talks on phone: Not on file    Gets together: Not on file    Attends religious service: Not on file    Active member of club or organization: Not on file    Attends meetings of clubs or organizations: Not on file    Relationship status: Not on file  . Intimate partner violence:    Fear of current or ex partner: Not on file    Emotionally abused: Not on file    Physically abused: Not on file    Forced sexual activity: Not on file  Other Topics Concern  . Not on file  Social History Narrative  . Not on file     PHYSICAL EXAM  GENERAL EXAM/CONSTITUTIONAL: Vitals:  Vitals:   09/11/18 0835  BP: (!) 164/91  Pulse: 66  Weight: 194 lb (88 kg)  Height: 5\' 7"  (1.702 m)     Body mass index is 30.38 kg/m. Wt Readings from Last 3 Encounters:  09/11/18 194 lb (88 kg)  05/31/18 201 lb 6.4 oz (91.4 kg)  11/08/17 197 lb (89.4 kg)     Patient is in no distress; well developed, nourished and groomed; neck is supple  CARDIOVASCULAR:  Examination of carotid arteries is normal; no carotid bruits  Regular rate and rhythm, no murmurs  Examination of peripheral vascular system by observation and palpation is normal  EYES:  Ophthalmoscopic exam of optic discs and posterior segments is normal; no papilledema or hemorrhages  Visual Acuity Screening   Right eye Left eye Both  eyes  Without correction:     With correction: 20/30 20/40      MUSCULOSKELETAL:  Gait, strength, tone, movements noted in Neurologic exam below  NEUROLOGIC: MENTAL STATUS:  No flowsheet data found.  awake, alert, oriented to person, place and time  recent and remote memory intact  normal attention and concentration  language fluent, comprehension intact, naming intact  fund of knowledge appropriate  CRANIAL NERVE:   2nd - no papilledema on fundoscopic exam  2nd, 3rd, 4th, 6th - pupils equal and reactive to light, visual fields full to confrontation, extraocular muscles intact, no nystagmus  5th - facial sensation symmetric  7th - facial strength symmetric  8th - hearing intact  9th - palate elevates symmetrically, uvula midline  11th - shoulder shrug symmetric  12th - tongue protrusion midline  MOTOR:   normal bulk and tone, full strength in the BUE, BLE; SLIGHTLY DECR DF IN FEET  SENSORY:   normal and symmetric to light touch, temperature, vibration; EXCEPT DECR VIB AT TOES (RIGHT LEG WORSE THAN LEFT); ABSENT PP IN FEET; DECR IN GRADIENT UP TO SHINS  COORDINATION:   finger-nose-finger, fine finger movements normal  REFLEXES:   deep tendon reflexes TRACE and symmetric; ABSENT AT ANKLES  GAIT/STATION:   narrow based gait; SHORT STEPS; LIMPING ON RIGHT LEG; CANNOT TANDEM, TOE OR HEEL GAIT; ROMBERG POSITIVE     DIAGNOSTIC DATA (LABS, IMAGING, TESTING) - I reviewed patient records, labs, notes, testing and imaging myself where available.  Lab Results  Component Value Date   HGB 13.9 11/11/2013   HCT 41.0 11/11/2013      Component Value Date/Time   NA 142 11/11/2013 0728   K 5.0 11/11/2013 0728   CL 105 11/11/2013 0728   GLUCOSE 123 (H) 11/11/2013 0728   BUN 13 11/11/2013 0728   CREATININE 0.80 11/11/2013 0728   No results found for: CHOL, HDL, LDLCALC, LDLDIRECT, TRIG, CHOLHDL No results found for: HGBA1C No results  found for:  VITAMINB12 No results found for: TSH   08/02/18 MRI brain [I reviewed images myself and agree with interpretation. -[facility]]  - moderate atrophy - moderate chronic small vessel ischemic disease    ASSESSMENT AND PLAN  68 y.o. year old female here with:  Dx:  1. TIA (transient ischemic attack)   2. Gait difficulty   3. Memory loss   4. Insomnia, unspecified type      PLAN:  TRANSIENT SLURRED SPEECH / APHASIA (TIA) - likely related to atrial fibrillation; not on anticoagulation now due to financial limitations - recommend to discuss with cardiology re: treatment options - continue diabetes and BP control  GENERAL BALANCE DIFFICULTY - likely related to diabetic neuropathy + low back pain (? lumbar radiculopathy) - consider PT evaluation - use cane / walker as needed  ANXIETY / INSOMNIA - consider psychiatry / psychology evaluation - optimize nutrition / exercise  MEMORY LOSS - likely related to sleep deprivation and anxiety; atrophy and chronic small vessel ischemic disease may be contributory factors as well  Return for pending if symptoms worsen or fail to improve, return to PCP.    Penni Bombard, MD 0/93/2355, 7:32 AM Certified in Neurology, Neurophysiology and Neuroimaging  [facility] [address] [place], [place] [address] [phone]

## 2018-09-11 NOTE — Patient Instructions (Signed)
TRANSIENT SLURRED SPEECH / APHASIA (TIA) - likely related to atrial fibrillation; not on anticoagulation now due to financial limitations - recommend to discuss with cardiology re: treatment options - continue diabetes and BP control  GENERAL BALANCE DIFFICULTY - likely related to diabetic neuropathy - consider PT evaluation  ANXIETY / INSOMNIA - consider psychiatry / psychology

## 2018-09-12 DIAGNOSIS — E034 Atrophy of thyroid (acquired): Secondary | ICD-10-CM | POA: Diagnosis not present

## 2018-09-18 DIAGNOSIS — I10 Essential (primary) hypertension: Secondary | ICD-10-CM | POA: Diagnosis not present

## 2018-09-18 DIAGNOSIS — I4819 Other persistent atrial fibrillation: Secondary | ICD-10-CM | POA: Diagnosis not present

## 2018-09-18 DIAGNOSIS — F331 Major depressive disorder, recurrent, moderate: Secondary | ICD-10-CM | POA: Diagnosis not present

## 2018-09-18 DIAGNOSIS — E782 Mixed hyperlipidemia: Secondary | ICD-10-CM | POA: Diagnosis not present

## 2018-09-18 DIAGNOSIS — E559 Vitamin D deficiency, unspecified: Secondary | ICD-10-CM | POA: Diagnosis not present

## 2018-09-18 DIAGNOSIS — E1142 Type 2 diabetes mellitus with diabetic polyneuropathy: Secondary | ICD-10-CM | POA: Diagnosis not present

## 2018-09-18 DIAGNOSIS — F5101 Primary insomnia: Secondary | ICD-10-CM | POA: Diagnosis not present

## 2018-09-18 DIAGNOSIS — G458 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: Secondary | ICD-10-CM | POA: Diagnosis not present

## 2018-09-18 DIAGNOSIS — E038 Other specified hypothyroidism: Secondary | ICD-10-CM | POA: Diagnosis not present

## 2018-09-30 DIAGNOSIS — M2011 Hallux valgus (acquired), right foot: Secondary | ICD-10-CM | POA: Diagnosis not present

## 2018-09-30 DIAGNOSIS — M2041 Other hammer toe(s) (acquired), right foot: Secondary | ICD-10-CM | POA: Insufficient documentation

## 2018-09-30 DIAGNOSIS — M2012 Hallux valgus (acquired), left foot: Secondary | ICD-10-CM | POA: Diagnosis not present

## 2018-09-30 DIAGNOSIS — M2042 Other hammer toe(s) (acquired), left foot: Secondary | ICD-10-CM | POA: Diagnosis not present

## 2018-09-30 DIAGNOSIS — E1142 Type 2 diabetes mellitus with diabetic polyneuropathy: Secondary | ICD-10-CM | POA: Diagnosis not present

## 2018-10-02 DIAGNOSIS — M7912 Myalgia of auxiliary muscles, head and neck: Secondary | ICD-10-CM | POA: Diagnosis not present

## 2018-10-02 DIAGNOSIS — M545 Low back pain: Secondary | ICD-10-CM | POA: Diagnosis not present

## 2018-10-02 DIAGNOSIS — M9905 Segmental and somatic dysfunction of pelvic region: Secondary | ICD-10-CM | POA: Diagnosis not present

## 2018-10-02 DIAGNOSIS — S39012A Strain of muscle, fascia and tendon of lower back, initial encounter: Secondary | ICD-10-CM | POA: Diagnosis not present

## 2018-10-02 DIAGNOSIS — M9903 Segmental and somatic dysfunction of lumbar region: Secondary | ICD-10-CM | POA: Diagnosis not present

## 2018-10-02 DIAGNOSIS — M9901 Segmental and somatic dysfunction of cervical region: Secondary | ICD-10-CM | POA: Diagnosis not present

## 2018-10-02 DIAGNOSIS — M9902 Segmental and somatic dysfunction of thoracic region: Secondary | ICD-10-CM | POA: Diagnosis not present

## 2018-10-09 DIAGNOSIS — M7912 Myalgia of auxiliary muscles, head and neck: Secondary | ICD-10-CM | POA: Diagnosis not present

## 2018-10-09 DIAGNOSIS — M9903 Segmental and somatic dysfunction of lumbar region: Secondary | ICD-10-CM | POA: Diagnosis not present

## 2018-10-09 DIAGNOSIS — M545 Low back pain: Secondary | ICD-10-CM | POA: Diagnosis not present

## 2018-10-09 DIAGNOSIS — S39012A Strain of muscle, fascia and tendon of lower back, initial encounter: Secondary | ICD-10-CM | POA: Diagnosis not present

## 2018-10-09 DIAGNOSIS — M9902 Segmental and somatic dysfunction of thoracic region: Secondary | ICD-10-CM | POA: Diagnosis not present

## 2018-10-09 DIAGNOSIS — M9905 Segmental and somatic dysfunction of pelvic region: Secondary | ICD-10-CM | POA: Diagnosis not present

## 2018-10-09 DIAGNOSIS — M9901 Segmental and somatic dysfunction of cervical region: Secondary | ICD-10-CM | POA: Diagnosis not present

## 2018-10-15 DIAGNOSIS — M9902 Segmental and somatic dysfunction of thoracic region: Secondary | ICD-10-CM | POA: Diagnosis not present

## 2018-10-15 DIAGNOSIS — M9901 Segmental and somatic dysfunction of cervical region: Secondary | ICD-10-CM | POA: Diagnosis not present

## 2018-10-15 DIAGNOSIS — M545 Low back pain: Secondary | ICD-10-CM | POA: Diagnosis not present

## 2018-10-15 DIAGNOSIS — M7912 Myalgia of auxiliary muscles, head and neck: Secondary | ICD-10-CM | POA: Diagnosis not present

## 2018-10-15 DIAGNOSIS — M9905 Segmental and somatic dysfunction of pelvic region: Secondary | ICD-10-CM | POA: Diagnosis not present

## 2018-10-15 DIAGNOSIS — M9903 Segmental and somatic dysfunction of lumbar region: Secondary | ICD-10-CM | POA: Diagnosis not present

## 2018-10-15 DIAGNOSIS — S39012A Strain of muscle, fascia and tendon of lower back, initial encounter: Secondary | ICD-10-CM | POA: Diagnosis not present

## 2018-10-22 DIAGNOSIS — M7912 Myalgia of auxiliary muscles, head and neck: Secondary | ICD-10-CM | POA: Diagnosis not present

## 2018-10-22 DIAGNOSIS — M9901 Segmental and somatic dysfunction of cervical region: Secondary | ICD-10-CM | POA: Diagnosis not present

## 2018-10-22 DIAGNOSIS — M545 Low back pain: Secondary | ICD-10-CM | POA: Diagnosis not present

## 2018-10-22 DIAGNOSIS — M9903 Segmental and somatic dysfunction of lumbar region: Secondary | ICD-10-CM | POA: Diagnosis not present

## 2018-10-22 DIAGNOSIS — M9902 Segmental and somatic dysfunction of thoracic region: Secondary | ICD-10-CM | POA: Diagnosis not present

## 2018-10-22 DIAGNOSIS — M9905 Segmental and somatic dysfunction of pelvic region: Secondary | ICD-10-CM | POA: Diagnosis not present

## 2018-10-22 DIAGNOSIS — S39012A Strain of muscle, fascia and tendon of lower back, initial encounter: Secondary | ICD-10-CM | POA: Diagnosis not present

## 2018-10-24 DIAGNOSIS — M7912 Myalgia of auxiliary muscles, head and neck: Secondary | ICD-10-CM | POA: Diagnosis not present

## 2018-10-24 DIAGNOSIS — S39012A Strain of muscle, fascia and tendon of lower back, initial encounter: Secondary | ICD-10-CM | POA: Diagnosis not present

## 2018-10-24 DIAGNOSIS — M545 Low back pain: Secondary | ICD-10-CM | POA: Diagnosis not present

## 2018-10-24 DIAGNOSIS — M9905 Segmental and somatic dysfunction of pelvic region: Secondary | ICD-10-CM | POA: Diagnosis not present

## 2018-10-24 DIAGNOSIS — M9901 Segmental and somatic dysfunction of cervical region: Secondary | ICD-10-CM | POA: Diagnosis not present

## 2018-10-24 DIAGNOSIS — M9902 Segmental and somatic dysfunction of thoracic region: Secondary | ICD-10-CM | POA: Diagnosis not present

## 2018-10-24 DIAGNOSIS — M9903 Segmental and somatic dysfunction of lumbar region: Secondary | ICD-10-CM | POA: Diagnosis not present

## 2018-10-29 DIAGNOSIS — S39012A Strain of muscle, fascia and tendon of lower back, initial encounter: Secondary | ICD-10-CM | POA: Diagnosis not present

## 2018-10-29 DIAGNOSIS — M545 Low back pain: Secondary | ICD-10-CM | POA: Diagnosis not present

## 2018-10-29 DIAGNOSIS — M9902 Segmental and somatic dysfunction of thoracic region: Secondary | ICD-10-CM | POA: Diagnosis not present

## 2018-10-29 DIAGNOSIS — M7912 Myalgia of auxiliary muscles, head and neck: Secondary | ICD-10-CM | POA: Diagnosis not present

## 2018-10-29 DIAGNOSIS — M9903 Segmental and somatic dysfunction of lumbar region: Secondary | ICD-10-CM | POA: Diagnosis not present

## 2018-10-29 DIAGNOSIS — M9901 Segmental and somatic dysfunction of cervical region: Secondary | ICD-10-CM | POA: Diagnosis not present

## 2018-10-29 DIAGNOSIS — M9905 Segmental and somatic dysfunction of pelvic region: Secondary | ICD-10-CM | POA: Diagnosis not present

## 2018-11-05 DIAGNOSIS — M9903 Segmental and somatic dysfunction of lumbar region: Secondary | ICD-10-CM | POA: Diagnosis not present

## 2018-11-05 DIAGNOSIS — M9901 Segmental and somatic dysfunction of cervical region: Secondary | ICD-10-CM | POA: Diagnosis not present

## 2018-11-05 DIAGNOSIS — M7912 Myalgia of auxiliary muscles, head and neck: Secondary | ICD-10-CM | POA: Diagnosis not present

## 2018-11-05 DIAGNOSIS — M9905 Segmental and somatic dysfunction of pelvic region: Secondary | ICD-10-CM | POA: Diagnosis not present

## 2018-11-05 DIAGNOSIS — S39012A Strain of muscle, fascia and tendon of lower back, initial encounter: Secondary | ICD-10-CM | POA: Diagnosis not present

## 2018-11-05 DIAGNOSIS — M545 Low back pain: Secondary | ICD-10-CM | POA: Diagnosis not present

## 2018-11-05 DIAGNOSIS — M9902 Segmental and somatic dysfunction of thoracic region: Secondary | ICD-10-CM | POA: Diagnosis not present

## 2018-11-11 DIAGNOSIS — E1142 Type 2 diabetes mellitus with diabetic polyneuropathy: Secondary | ICD-10-CM | POA: Diagnosis not present

## 2018-11-11 DIAGNOSIS — E538 Deficiency of other specified B group vitamins: Secondary | ICD-10-CM | POA: Diagnosis not present

## 2018-11-11 DIAGNOSIS — E559 Vitamin D deficiency, unspecified: Secondary | ICD-10-CM | POA: Diagnosis not present

## 2018-11-11 DIAGNOSIS — F331 Major depressive disorder, recurrent, moderate: Secondary | ICD-10-CM | POA: Diagnosis not present

## 2018-11-12 DIAGNOSIS — M7912 Myalgia of auxiliary muscles, head and neck: Secondary | ICD-10-CM | POA: Diagnosis not present

## 2018-11-12 DIAGNOSIS — M9902 Segmental and somatic dysfunction of thoracic region: Secondary | ICD-10-CM | POA: Diagnosis not present

## 2018-11-12 DIAGNOSIS — M9901 Segmental and somatic dysfunction of cervical region: Secondary | ICD-10-CM | POA: Diagnosis not present

## 2018-11-12 DIAGNOSIS — M545 Low back pain: Secondary | ICD-10-CM | POA: Diagnosis not present

## 2018-11-12 DIAGNOSIS — M9905 Segmental and somatic dysfunction of pelvic region: Secondary | ICD-10-CM | POA: Diagnosis not present

## 2018-11-12 DIAGNOSIS — M9903 Segmental and somatic dysfunction of lumbar region: Secondary | ICD-10-CM | POA: Diagnosis not present

## 2018-11-12 DIAGNOSIS — S39012A Strain of muscle, fascia and tendon of lower back, initial encounter: Secondary | ICD-10-CM | POA: Diagnosis not present

## 2018-11-19 DIAGNOSIS — F331 Major depressive disorder, recurrent, moderate: Secondary | ICD-10-CM | POA: Diagnosis not present

## 2018-11-19 DIAGNOSIS — I10 Essential (primary) hypertension: Secondary | ICD-10-CM | POA: Diagnosis not present

## 2018-11-19 DIAGNOSIS — Z6832 Body mass index (BMI) 32.0-32.9, adult: Secondary | ICD-10-CM | POA: Diagnosis not present

## 2018-11-19 DIAGNOSIS — E1142 Type 2 diabetes mellitus with diabetic polyneuropathy: Secondary | ICD-10-CM | POA: Diagnosis not present

## 2018-11-19 DIAGNOSIS — I4819 Other persistent atrial fibrillation: Secondary | ICD-10-CM | POA: Diagnosis not present

## 2018-11-19 DIAGNOSIS — E782 Mixed hyperlipidemia: Secondary | ICD-10-CM | POA: Diagnosis not present

## 2018-11-26 ENCOUNTER — Telehealth: Payer: Self-pay | Admitting: *Deleted

## 2018-11-26 DIAGNOSIS — M9905 Segmental and somatic dysfunction of pelvic region: Secondary | ICD-10-CM | POA: Diagnosis not present

## 2018-11-26 DIAGNOSIS — S39012A Strain of muscle, fascia and tendon of lower back, initial encounter: Secondary | ICD-10-CM | POA: Diagnosis not present

## 2018-11-26 DIAGNOSIS — M545 Low back pain: Secondary | ICD-10-CM | POA: Diagnosis not present

## 2018-11-26 DIAGNOSIS — M9903 Segmental and somatic dysfunction of lumbar region: Secondary | ICD-10-CM | POA: Diagnosis not present

## 2018-11-26 DIAGNOSIS — M9902 Segmental and somatic dysfunction of thoracic region: Secondary | ICD-10-CM | POA: Diagnosis not present

## 2018-11-26 DIAGNOSIS — M9901 Segmental and somatic dysfunction of cervical region: Secondary | ICD-10-CM | POA: Diagnosis not present

## 2018-11-26 DIAGNOSIS — M7912 Myalgia of auxiliary muscles, head and neck: Secondary | ICD-10-CM | POA: Diagnosis not present

## 2018-11-26 NOTE — Telephone Encounter (Signed)
Pt called to request samples for Eliquis 5 mg. I asked Dr. Geraldo Pitter if it would be okay and he said it would be. Will leave samples for pt and she will come by tomorrow to pick up meds. Pt set up appt at this time.

## 2018-11-30 DIAGNOSIS — D1801 Hemangioma of skin and subcutaneous tissue: Secondary | ICD-10-CM | POA: Diagnosis not present

## 2018-11-30 DIAGNOSIS — L820 Inflamed seborrheic keratosis: Secondary | ICD-10-CM | POA: Diagnosis not present

## 2018-11-30 DIAGNOSIS — L853 Xerosis cutis: Secondary | ICD-10-CM | POA: Diagnosis not present

## 2018-12-04 DIAGNOSIS — R1031 Right lower quadrant pain: Secondary | ICD-10-CM | POA: Diagnosis not present

## 2018-12-04 DIAGNOSIS — I4891 Unspecified atrial fibrillation: Secondary | ICD-10-CM | POA: Diagnosis not present

## 2018-12-04 DIAGNOSIS — I10 Essential (primary) hypertension: Secondary | ICD-10-CM | POA: Diagnosis not present

## 2018-12-04 DIAGNOSIS — E119 Type 2 diabetes mellitus without complications: Secondary | ICD-10-CM | POA: Diagnosis not present

## 2018-12-10 DIAGNOSIS — M7912 Myalgia of auxiliary muscles, head and neck: Secondary | ICD-10-CM | POA: Diagnosis not present

## 2018-12-10 DIAGNOSIS — M9902 Segmental and somatic dysfunction of thoracic region: Secondary | ICD-10-CM | POA: Diagnosis not present

## 2018-12-10 DIAGNOSIS — M545 Low back pain: Secondary | ICD-10-CM | POA: Diagnosis not present

## 2018-12-10 DIAGNOSIS — M9905 Segmental and somatic dysfunction of pelvic region: Secondary | ICD-10-CM | POA: Diagnosis not present

## 2018-12-10 DIAGNOSIS — S39012A Strain of muscle, fascia and tendon of lower back, initial encounter: Secondary | ICD-10-CM | POA: Diagnosis not present

## 2018-12-10 DIAGNOSIS — M9903 Segmental and somatic dysfunction of lumbar region: Secondary | ICD-10-CM | POA: Diagnosis not present

## 2018-12-10 DIAGNOSIS — M9901 Segmental and somatic dysfunction of cervical region: Secondary | ICD-10-CM | POA: Diagnosis not present

## 2018-12-16 DIAGNOSIS — H35371 Puckering of macula, right eye: Secondary | ICD-10-CM | POA: Diagnosis not present

## 2018-12-16 DIAGNOSIS — H04123 Dry eye syndrome of bilateral lacrimal glands: Secondary | ICD-10-CM | POA: Diagnosis not present

## 2018-12-17 NOTE — Progress Notes (Signed)
Virtual Visit via Telephone Note   This visit type was conducted due to national recommendations for restrictions regarding the COVID-19 Pandemic (e.g. social distancing) in an effort to limit this patient's exposure and mitigate transmission in our community.  Due to her co-morbid illnesses, this patient is at least at moderate risk for complications without adequate follow up.  This format is felt to be most appropriate for this patient at this time.  The patient did not have access to video technology/had technical difficulties with video requiring transitioning to audio format only (telephone).  All issues noted in this document were discussed and addressed.  No physical exam could be performed with this format.  Please refer to the patient's chart for her  consent to telehealth for [facility].   Date:  12/18/2018   ID:  [name], DOB February 28, 1951, MRN [id number]  Patient Location: Home Provider Location: Office  PCP:  [name], MD  Cardiologist:  Shirlee More, MD  Electrophysiologist:  None   Evaluation Performed:  Follow-Up Visit  Chief Complaint:  For atrial fibrillation  History of Present Illness:    April Munoz is a 68 y.o. female with a hx of PAF with CHADS2 vasc of 6 on propafenone,  hypertension  and T2 DM last seen 05/31/18.She did a 30 day monitor and noted several episodes of atrial flutter and atrial fibrillation. Echocardiogram showed preserved left ventricular ejection fraction with moderate left atrial enlargement and no valvular heart disease. Chart review shows a neurology consultation in February felt to have TIAs and she had not started her Eliquis.  Anticoagulation in the setting of paroxysmal atrial fibrillation has been difficult. 05/31/18 office visit she made the decision to not be anticoagulated. She restarted anticoagulation 07/01/18 at her request. Seen by neurology 09/11/18 for TIA at request of her PCP. She was given Eliquis samples 11/26/18.   She  reports she has been taking her Eliquis regularly as the visit with the neurology really "scared" her. Denies bleeding complications. She reports intermittent palpitations more frequently than previous. She states she feels her heart "doesn't start back up" is "erratic" and associated with feeling week. Occur both at rest and with activity. These episodes last a few seconds and occur every 3-4 days. They are not associated with chest pain. She denies SOB and edema. Takes her diuretic every 3-4 days with good control. Able to do aerobic exercise in her hub and has been walking up and down a ramp at home for exercise. Does endorse some DOE which is unchanged over the last 3 months.   The patient does not have symptoms concerning for COVID-19 infection (fever, chills, cough, or new shortness of breath).    Past Medical History:  Diagnosis Date  . Anxiety   . Arthritis   . Atrial fibrillation ([place])   . B12 deficiency 10/31/2017  . Cellulitis of abdominal wall 07/07/2016  . Depression   . Diabetes mellitus without complication ([place])   . Esophageal stricture 06/05/2017  . Essential hypertension 02/24/2013  . Fibromyalgia 02/24/2013  . Full dentures   . Hereditary and idiopathic peripheral neuropathy 02/24/2013  . Hyperlipidemia 02/24/2013  . Hypertension   . Murmur, cardiac 08/15/2015  . Neuropathy in diabetes ([place]) 10/31/2017  . Osteoporosis   . Paroxysmal atrial fibrillation ([place]) 08/15/2015   CHADS2vasc=3 CHADS2vasc=3  . Restless legs syndrome 03/14/2013  . Thoracic or lumbosacral neuritis or radiculitis 02/24/2013  . Type 2 diabetes mellitus, without long-term current use of insulin ([place]) 02/24/2013  .  Wears glasses    Past Surgical History:  Procedure Laterality Date  . BREAST REDUCTION SURGERY  1983  . CATARACT EXTRACTION, BILATERAL  2018, 2019  . CHOLECYSTECTOMY  1971   open  . DIAGNOSTIC LAPAROSCOPY  2010   lysis of adhesions  . DILATION AND CURETTAGE OF UTERUS  2004  . KNEE  ARTHROSCOPY  1998   left  . KNEE ARTHROSCOPY W/ LATERAL RETINACULAR REPAIR    . MASS EXCISION Left 11/11/2013   Procedure: EXCISION MUCOID CYST LEFT INDEX FINGER/DEBRIDEMENT LEFT INDEX FINGER;  Surgeon: Wynonia Sours, MD;  Location: [place];  Service: Orthopedics;  Laterality: Left;  ANESTHESIA: IV REGIONAL/FAB  . SHOULDER ARTHROSCOPY W/ ROTATOR CUFF REPAIR  2007   left  . TRIGGER FINGER RELEASE Left 11/11/2013   Procedure: RELEASE A-1 PULLEY LEFT RING FINGER;  Surgeon: Wynonia Sours, MD;  Location: [place];  Service: Orthopedics;  Laterality: Left;  . UMBILICAL HERNIA REPAIR  2008, 2010     Current Meds  Medication Sig  . ALPRAZolam (XANAX) 0.5 MG tablet Take 0.5 mg by mouth at bedtime as needed for anxiety.  Marland Kitchen apixaban (ELIQUIS) 5 MG TABS tablet Take 1 tablet (5 mg total) by mouth 2 (two) times daily.  Marland Kitchen b complex vitamins tablet Take 1 tablet by mouth daily.  Marland Kitchen BIOTIN PO Take by mouth. 800mg  daily  . calcium gluconate 500 MG tablet Take 1 tablet by mouth daily.  . Calcium-Magnesium-Vitamin D (CALCIUM 500 PO) Take 2 tablets by mouth daily.  . cyanocobalamin (,VITAMIN B-12,) 1000 MCG/ML injection Inject 1,000 mcg into the muscle every 30 (thirty) days.  Marland Kitchen gabapentin (NEURONTIN) 300 MG capsule Take 300 mg by mouth daily.  Marland Kitchen levothyroxine (SYNTHROID) 50 MCG tablet Take 50 mcg by mouth daily.  . metFORMIN (GLUCOPHAGE) 1000 MG tablet Take 1,000 mg by mouth 2 (two) times daily with a meal.  . metoprolol succinate (TOPROL-XL) 25 MG 24 hr tablet Take 25 mg by mouth 2 (two) times a day.   Marland Kitchen omeprazole (PRILOSEC) 20 MG capsule Take 20 mg by mouth daily.  . potassium chloride (K-DUR) 10 MEQ tablet Take 10 mEq by mouth daily.  . pravastatin (PRAVACHOL) 20 MG tablet Take 20 mg by mouth 4 (four) times a week.  . Probiotic Product (PROBIOTIC & ACIDOPHILUS EX ST PO) Take 1 tablet by mouth daily.   . propafenone (RYTHMOL) 150 MG tablet Take 1 tablet (150 mg total) by  mouth 2 (two) times daily.  Marland Kitchen torsemide (DEMADEX) 20 MG tablet Take 20 mg by mouth daily.  Marland Kitchen venlafaxine XR (EFFEXOR-XR) 75 MG 24 hr capsule Take 75 mg by mouth 3 (three) times daily.      Allergies:   Prednisone   Social History   Tobacco Use  . Smoking status: Never Smoker  . Smokeless tobacco: Never Used  Substance Use Topics  . Alcohol use: No    Comment: very rare  . Drug use: No     Family Hx: The patient's family history includes Atrial fibrillation in her mother; Diabetes in her maternal grandmother; Heart attack in her brother and mother; Heart disease in her brother; Heart failure in her father and paternal grandfather; Stroke in her paternal grandfather.  ROS:   Please see the history of present illness.     All other systems reviewed and are negative.   Prior CV studies:   The following studies were reviewed today:   Labs/Other Tests and Data Reviewed:  EKG:  An ECG dated  was personally reviewed today and demonstrated:  05/31/18 sinus rhythm and is normal  Recent Labs: 09/18/18:  A1c 6.9% CMP Cr 1.8 GFR 53 cc/min normal AST ALT Chol 189 HDL 63 LDL 87 Hgb 11.1 TSH normal 4.36  Wt Readings from Last 3 Encounters:  12/18/18 195 lb (88.5 kg)  09/11/18 194 lb (88 kg)  05/31/18 201 lb 6.4 oz (91.4 kg)     Objective:    Vital Signs:  Wt 195 lb (88.5 kg)   BMI 30.54 kg/m    VITAL SIGNS:  reviewed  ASSESSMENT & PLAN:    1. PAF - Reports palpitations that are different than previous and increasing in frequency. States it feels like her heart "doesn't start back up" and is "erratic" associated with feeling weak. Occurs at rest and with activity. Last a few seconds and occur every 3-4 days. Anticoagulated on Eliquis.  Plan for 14 day Zio monitor. 2. Chronic anticoagulation - Reports compliance with her Eliquis 5mg  BID. Reports the visit with the neurologist really scared her. States she has a $300 copay despite meeting her deductible - encouraged her  to call her insurance company. She asked to send prescription to new pharmacy which we will do.   Send Rx for Eliquis 5mg  BID.  Referral to social work for medication assistance due to cost.  3. TIA - Seen by neurology 09/11/18. Reports her mentation and gait are improving. There was note by the neurologist to discuss options regarding anticoagulation - see above.  4. High risk medication use - Propafenone. Endorses compliance with medications.  EKG at office visit in 4 weeks. 5. Hypertensive heart disease without heart failure - Has been checking BP intermittently at home. Exercising by doing aerobics in her hot tub and walking. Denies LE edema and SOB. Endorses some DOE that is unchanged over the last 3 months. Continue current anti-hypertensives and diuretics.   COVID-19 Education: The signs and symptoms of COVID-19 were discussed with the patient and how to seek care for testing (follow up with PCP or arrange E-visit).  The importance of social distancing was discussed today. She has been careful only being to the store twice since [place] started.   Time:   Today, I have spent 25 minutes with the patient with telehealth technology discussing the above problems.     Medication Adjustments/Labs and Tests Ordered: Current medicines are reviewed at length with the patient today.  Concerns regarding medicines are outlined above.   Tests Ordered: No orders of the defined types were placed in this encounter.   Medication Changes: No orders of the defined types were placed in this encounter.   Disposition:  Follow up in 1 month in office  Signed, Shirlee More, MD  12/18/2018 11:49 AM    [place]

## 2018-12-18 ENCOUNTER — Telehealth (INDEPENDENT_AMBULATORY_CARE_PROVIDER_SITE_OTHER): Payer: Medicare Other | Admitting: Cardiology

## 2018-12-18 ENCOUNTER — Telehealth: Payer: Self-pay

## 2018-12-18 ENCOUNTER — Other Ambulatory Visit: Payer: Self-pay

## 2018-12-18 ENCOUNTER — Encounter: Payer: Self-pay | Admitting: Cardiology

## 2018-12-18 VITALS — Wt 195.0 lb

## 2018-12-18 DIAGNOSIS — I480 Paroxysmal atrial fibrillation: Secondary | ICD-10-CM

## 2018-12-18 DIAGNOSIS — Z7901 Long term (current) use of anticoagulants: Secondary | ICD-10-CM

## 2018-12-18 DIAGNOSIS — Z79899 Other long term (current) drug therapy: Secondary | ICD-10-CM

## 2018-12-18 DIAGNOSIS — I119 Hypertensive heart disease without heart failure: Secondary | ICD-10-CM

## 2018-12-18 MED ORDER — APIXABAN 5 MG PO TABS: 5.0000 mg | ORAL_TABLET | Freq: Two times a day (BID) | ORAL | 5 refills | Status: DC

## 2018-12-18 NOTE — Telephone Encounter (Signed)
YOUR CARDIOLOGY TEAM HAS ARRANGED FOR AN E-VISIT FOR YOUR APPOINTMENT - PLEASE REVIEW IMPORTANT INFORMATION BELOW SEVERAL DAYS PRIOR TO YOUR APPOINTMENT  Due to the recent COVID-19 pandemic, we are transitioning in-person office visits to tele-medicine visits in an effort to decrease unnecessary exposure to our patients, their families, and staff. These visits are billed to your insurance just like a normal visit is. We also encourage you to sign up for MyChart if you have not already done so. You will need a smartphone if possible. For patients that do not have this, we can still complete the visit using a regular telephone but do prefer a smartphone to enable video when possible. You may have a family member that lives with you that can help. If possible, we also ask that you have a blood pressure cuff and scale at home to measure your blood pressure, heart rate and weight prior to your scheduled appointment. Patients with clinical needs that need an in-person evaluation and testing will still be able to come to the office if absolutely necessary. If you have any questions, feel free to call our office.     YOUR PROVIDER WILL BE USING THE FOLLOWING PLATFORM TO COMPLETE YOUR VISIT: *TELEPHONE VISIT   . IF USING MYCHART - How to Download the MyChart App to Your SmartPhone   - If Apple, go to [employer] and type in MyChart in the search bar and download the app. If Android, ask patient to go to [employer] and type in [place] in the search bar and download the app. The app is free but as with any other app downloads, your phone may require you to verify saved payment information or Apple/Android password.  - You will need to then log into the app with your MyChart username and password, and select [facility] as your healthcare provider to link the account.  - When it is time for your visit, go to the MyChart app, find appointments, and click Begin Video Visit. Be sure to Select Allow for your  device to access the Microphone and Camera for your visit. You will then be connected, and your provider will be with you shortly.  **If you have any issues connecting or need assistance, please contact MyChart service desk [phone])**  **If using a computer, in order to ensure the best quality for your visit, you will need to use either of the following Internet Browsers: Insurance underwriter or [employer]**  . IF USING DOXIMITY or DOXY.ME - The staff will give you instructions on receiving your link to join the meeting the day of your visit.      2-3 DAYS BEFORE YOUR APPOINTMENT  You will receive a telephone call from one of our [facility] team members - your caller ID may say "Unknown caller." If this is a video visit, we will walk you through how to get the video launched on your phone. We will remind you check your blood pressure, heart rate and weight prior to your scheduled appointment. If you have an Apple Watch or Kardia, please upload any pertinent ECG strips the day before or morning of your appointment to [place]. Our staff will also make sure you have reviewed the consent and agree to move forward with your scheduled tele-health visit.     THE DAY OF YOUR APPOINTMENT  Approximately 15 minutes prior to your scheduled appointment, you will receive a telephone call from one of [facility] team - your caller ID may say "Unknown  caller."  Our staff will confirm medications, vital signs for the day and any symptoms you may be experiencing. Please have this information available prior to the time of visit start. It may also be helpful for you to have a pad of paper and pen handy for any instructions given during your visit. They will also walk you through joining the smartphone meeting if this is a video visit.    CONSENT FOR TELE-HEALTH VISIT - PLEASE REVIEW  I hereby voluntarily request, consent and authorize [facility] and its employed or contracted physicians,  physician assistants, nurse practitioners or other licensed health care professionals (the Practitioner), to provide me with telemedicine health care services (the "Services") as deemed necessary by the treating Practitioner. I acknowledge and consent to receive the Services by the Practitioner via telemedicine. I understand that the telemedicine visit will involve communicating with the Practitioner through live audiovisual communication technology and the disclosure of certain medical information by electronic transmission. I acknowledge that I have been given the opportunity to request an in-person assessment or other available alternative prior to the telemedicine visit and am voluntarily participating in the telemedicine visit.  I understand that I have the right to withhold or withdraw my consent to the use of telemedicine in the course of my care at any time, without affecting my right to future care or treatment, and that the Practitioner or I may terminate the telemedicine visit at any time. I understand that I have the right to inspect all information obtained and/or recorded in the course of the telemedicine visit and may receive copies of available information for a reasonable fee.  I understand that some of the potential risks of receiving the Services via telemedicine include:  Marland Kitchen Delay or interruption in medical evaluation due to technological equipment failure or disruption; . Information transmitted may not be sufficient (e.g. poor resolution of images) to allow for appropriate medical decision making by the Practitioner; and/or  . In rare instances, security protocols could fail, causing a breach of personal health information.  Furthermore, I acknowledge that it is my responsibility to provide information about my medical history, conditions and care that is complete and accurate to the best of my ability. I acknowledge that Practitioner's advice, recommendations, and/or decision may be  based on factors not within their control, such as incomplete or inaccurate data provided by me or distortions of diagnostic images or specimens that may result from electronic transmissions. I understand that the practice of medicine is not an exact science and that Practitioner makes no warranties or guarantees regarding treatment outcomes. I acknowledge that I will receive a copy of this consent concurrently upon execution via email to the email address I last provided but may also request a printed copy by calling the office of [facility].    I understand that my insurance will be billed for this visit.   I have read or had this consent read to me. . I understand the contents of this consent, which adequately explains the benefits and risks of the Services being provided via telemedicine.  . I have been provided ample opportunity to ask questions regarding this consent and the Services and have had my questions answered to my satisfaction. . I give my informed consent for the services to be provided through the use of telemedicine in my medical care  By participating in this telemedicine visit I agree to the above.  Patient agreed to telephone visit.

## 2018-12-18 NOTE — Patient Instructions (Addendum)
Medication Instructions:  Your physician recommends that you continue on your current medications as directed. Please refer to the Current Medication list given to you today.  If you need a refill on your cardiac medications before your next appointment, please call your pharmacy.   Lab work: none If you have labs (blood work) drawn today and your tests are completely normal, you will receive your results only by: Marland Kitchen MyChart Message (if you have MyChart) OR . A paper copy in the mail If you have any lab test that is abnormal or we need to change your treatment, we will call you to review the results.  Testing/Procedures: Your physician has recommended that you wear a ZIO monitor. ZIO monitors are medical devices that record the heart's electrical activity. Doctors most often use these monitors to diagnose arrhythmias. Arrhythmias are problems with the speed or rhythm of the heartbeat. The monitor is a small, portable device. You can wear one while you do your normal daily activities. This is usually used to diagnose what is causing palpitations/syncope (passing out).  WEAR  ZIO 14 days  Follow-Up: At [facility], you and your health needs are our priority.  As part of our continuing mission to provide you with exceptional heart care, we have created designated Provider Care Teams.  These Care Teams include your primary Cardiologist (physician) and Advanced Practice Providers (APPs -  Physician Assistants and Nurse Practitioners) who all work together to provide you with the care you need, when you need it. You will need a follow up appointment in 4 weeks.  Any Other Special Instructions Will Be Listed Below (If Applicable).

## 2018-12-20 DIAGNOSIS — E038 Other specified hypothyroidism: Secondary | ICD-10-CM | POA: Diagnosis not present

## 2018-12-20 DIAGNOSIS — E1142 Type 2 diabetes mellitus with diabetic polyneuropathy: Secondary | ICD-10-CM | POA: Diagnosis not present

## 2018-12-20 DIAGNOSIS — I10 Essential (primary) hypertension: Secondary | ICD-10-CM | POA: Diagnosis not present

## 2018-12-20 DIAGNOSIS — F331 Major depressive disorder, recurrent, moderate: Secondary | ICD-10-CM | POA: Diagnosis not present

## 2018-12-20 DIAGNOSIS — I4819 Other persistent atrial fibrillation: Secondary | ICD-10-CM | POA: Diagnosis not present

## 2018-12-20 DIAGNOSIS — E559 Vitamin D deficiency, unspecified: Secondary | ICD-10-CM | POA: Diagnosis not present

## 2018-12-23 DIAGNOSIS — E1169 Type 2 diabetes mellitus with other specified complication: Secondary | ICD-10-CM | POA: Diagnosis not present

## 2018-12-23 DIAGNOSIS — E782 Mixed hyperlipidemia: Secondary | ICD-10-CM | POA: Diagnosis not present

## 2018-12-23 DIAGNOSIS — E038 Other specified hypothyroidism: Secondary | ICD-10-CM | POA: Diagnosis not present

## 2018-12-24 DIAGNOSIS — M7912 Myalgia of auxiliary muscles, head and neck: Secondary | ICD-10-CM | POA: Diagnosis not present

## 2018-12-24 DIAGNOSIS — M9903 Segmental and somatic dysfunction of lumbar region: Secondary | ICD-10-CM | POA: Diagnosis not present

## 2018-12-24 DIAGNOSIS — M9901 Segmental and somatic dysfunction of cervical region: Secondary | ICD-10-CM | POA: Diagnosis not present

## 2018-12-24 DIAGNOSIS — M9905 Segmental and somatic dysfunction of pelvic region: Secondary | ICD-10-CM | POA: Diagnosis not present

## 2018-12-24 DIAGNOSIS — M545 Low back pain: Secondary | ICD-10-CM | POA: Diagnosis not present

## 2018-12-24 DIAGNOSIS — S39012A Strain of muscle, fascia and tendon of lower back, initial encounter: Secondary | ICD-10-CM | POA: Diagnosis not present

## 2018-12-24 DIAGNOSIS — M9902 Segmental and somatic dysfunction of thoracic region: Secondary | ICD-10-CM | POA: Diagnosis not present

## 2018-12-25 DIAGNOSIS — H43822 Vitreomacular adhesion, left eye: Secondary | ICD-10-CM | POA: Diagnosis not present

## 2018-12-31 ENCOUNTER — Telehealth: Payer: Self-pay | Admitting: Cardiology

## 2018-12-31 NOTE — Telephone Encounter (Signed)
Called patient to reschedule her 01/13/2019 appt with BJM due to him being out of the offie. Patient is in the bed not feeling well so she will call back to reschedule

## 2019-01-02 ENCOUNTER — Other Ambulatory Visit (INDEPENDENT_AMBULATORY_CARE_PROVIDER_SITE_OTHER): Payer: Medicare Other

## 2019-01-02 DIAGNOSIS — I480 Paroxysmal atrial fibrillation: Secondary | ICD-10-CM | POA: Diagnosis not present

## 2019-01-06 ENCOUNTER — Telehealth: Payer: Self-pay | Admitting: Cardiology

## 2019-01-06 NOTE — Telephone Encounter (Signed)
States BJM told her someone from social services was gonna call her about eliquis

## 2019-01-07 NOTE — Telephone Encounter (Signed)
Informed patient that the social work referral has been placed; however, I do not have a way to contact them directly. In the meantime, advised patient that we could start the patient assistance process and paperwork for eliquis. Patient is agreeable and states that she has already been sent paperwork from [employer]. Patient will review this paperwork and contact our office with any further questions or concerns as she completes it.

## 2019-01-13 ENCOUNTER — Ambulatory Visit: Payer: Medicare Other | Admitting: Cardiology

## 2019-01-14 DIAGNOSIS — E782 Mixed hyperlipidemia: Secondary | ICD-10-CM | POA: Diagnosis not present

## 2019-01-14 DIAGNOSIS — E1169 Type 2 diabetes mellitus with other specified complication: Secondary | ICD-10-CM | POA: Diagnosis not present

## 2019-01-14 DIAGNOSIS — I10 Essential (primary) hypertension: Secondary | ICD-10-CM | POA: Diagnosis not present

## 2019-01-14 DIAGNOSIS — I4819 Other persistent atrial fibrillation: Secondary | ICD-10-CM | POA: Diagnosis not present

## 2019-01-15 ENCOUNTER — Ambulatory Visit: Payer: Medicare Other | Admitting: Cardiology

## 2019-01-16 DIAGNOSIS — M9905 Segmental and somatic dysfunction of pelvic region: Secondary | ICD-10-CM | POA: Diagnosis not present

## 2019-01-16 DIAGNOSIS — M9902 Segmental and somatic dysfunction of thoracic region: Secondary | ICD-10-CM | POA: Diagnosis not present

## 2019-01-16 DIAGNOSIS — M9901 Segmental and somatic dysfunction of cervical region: Secondary | ICD-10-CM | POA: Diagnosis not present

## 2019-01-16 DIAGNOSIS — M9903 Segmental and somatic dysfunction of lumbar region: Secondary | ICD-10-CM | POA: Diagnosis not present

## 2019-01-22 NOTE — Progress Notes (Signed)
Cardiology Office Note:    Date:  01/23/2019   ID:  April Munoz, DOB 1950-10-27, MRN [id number]  PCP:  [name], MD  Cardiologist:  Shirlee More, MD    Referring MD: [name], MD    ASSESSMENT:    1. Paroxysmal atrial fibrillation (HCC)   2. Chronic anticoagulation   3. Hypertensive heart disease without heart failure    PLAN:    In order of problems listed above:  1. Atrial fibrillation is worsened now persistent she has failed antiarrhythmic drug with propafenone and I discontinued it.  She will continue her beta-blocker and after discussion of options of remaining in rate controlled atrial fibrillation, other antiarrhythmic drug with cardioversions or referral for EP consultation to consider interventional procedures decides to pursue the latter and is referred to Dr. Curt Bears.  She will remain anticoagulated.  Continue beta-blocker for heart rate control. 2. Continue anticoagulant specially after TIA 3. Stable hypertension continue current treatment 4. Stable diabetes, if additional drug is needed SGLT2 inhibitor be appropriate in the diabetic   Next appointment: 3 months with me   Medication Adjustments/Labs and Tests Ordered: Current medicines are reviewed at length with the patient today.  Concerns regarding medicines are outlined above.  No orders of the defined types were placed in this encounter.  No orders of the defined types were placed in this encounter.   Chief Complaint  Patient presents with  . Follow-up    for   . Atrial Fibrillation  . Anticoagulation    after a TIA    History of Present Illness:    April Munoz is a 68 y.o. female with a hx of  PAF with CHADS2 vasc of 4 moderate strke risk  on propafenone,  hypertension  and T2 DM last seen 11/29/16.She carried a 30 day monitor and noted several episodes of atrial flutter and atrial fibrillation prior to use of flecainide. Echocardiogram showed preserved left ventricular ejection fraction with  moderate left atrial enlargement and no valvular heart disease. She is now anticoagulated after a TIA. She was last seen 12/18/2018. Compliance with diet, lifestyle and medications: Yes  She is aware of palpitation think she is having atrial fibrillation but really has profound malaise fatigue exercise intolerance due to her atrial fibrillation no edema shortness of breath chest pain or syncope.  She is compliant with her anticoagulant has had no bleeding complication  25 minutes spent with the patient discussing the history of atrial fibrillation goals of treatment and arranging a treatment plan.  During this visit I checked in a recent event monitor has yet to be processed. Past Medical History:  Diagnosis Date  . Anxiety   . Arthritis   . Atrial fibrillation ([place])   . B12 deficiency 10/31/2017  . Cellulitis of abdominal wall 07/07/2016  . Depression   . Diabetes mellitus without complication ([place])   . Esophageal stricture 06/05/2017  . Essential hypertension 02/24/2013  . Fibromyalgia 02/24/2013  . Full dentures   . Hereditary and idiopathic peripheral neuropathy 02/24/2013  . Hyperlipidemia 02/24/2013  . Hypertension   . Murmur, cardiac 08/15/2015  . Neuropathy in diabetes ([place]) 10/31/2017  . Osteoporosis   . Paroxysmal atrial fibrillation ([place]) 08/15/2015   CHADS2vasc=3 CHADS2vasc=3  . Restless legs syndrome 03/14/2013  . Thoracic or lumbosacral neuritis or radiculitis 02/24/2013  . Type 2 diabetes mellitus, without long-term current use of insulin ([place]) 02/24/2013  . Wears glasses     Past Surgical History:  Procedure Laterality Date  . BREAST  REDUCTION SURGERY  1983  . CATARACT EXTRACTION, BILATERAL  2018, 2019  . CHOLECYSTECTOMY  1971   open  . DIAGNOSTIC LAPAROSCOPY  2010   lysis of adhesions  . DILATION AND CURETTAGE OF UTERUS  2004  . KNEE ARTHROSCOPY  1998   left  . KNEE ARTHROSCOPY W/ LATERAL RETINACULAR REPAIR    . MASS EXCISION Left 11/11/2013   Procedure: EXCISION  MUCOID CYST LEFT INDEX FINGER/DEBRIDEMENT LEFT INDEX FINGER;  Surgeon: Wynonia Sours, MD;  Location: [facility];  Service: Orthopedics;  Laterality: Left;  ANESTHESIA: IV REGIONAL/FAB  . SHOULDER ARTHROSCOPY W/ ROTATOR CUFF REPAIR  2007   left  . TRIGGER FINGER RELEASE Left 11/11/2013   Procedure: RELEASE A-1 PULLEY LEFT RING FINGER;  Surgeon: Wynonia Sours, MD;  Location: [facility];  Service: Orthopedics;  Laterality: Left;  . UMBILICAL HERNIA REPAIR  2008, 2010    Current Medications: Current Meds  Medication Sig  . ALPRAZolam (XANAX) 0.5 MG tablet Take 0.5 mg by mouth at bedtime as needed for anxiety.  Marland Kitchen apixaban (ELIQUIS) 5 MG TABS tablet Take 1 tablet (5 mg total) by mouth 2 (two) times daily.  Marland Kitchen b complex vitamins tablet Take 1 tablet by mouth daily.  Marland Kitchen BIOTIN PO Take by mouth. 800mg  daily  . cyanocobalamin (,VITAMIN B-12,) 1000 MCG/ML injection Inject 1,000 mcg into the muscle every 30 (thirty) days.  Marland Kitchen levothyroxine (SYNTHROID) 50 MCG tablet Take 50 mcg by mouth daily.  . metFORMIN (GLUCOPHAGE) 1000 MG tablet Take 1,000 mg by mouth 2 (two) times daily with a meal.  . metoprolol succinate (TOPROL-XL) 25 MG 24 hr tablet Take 25 mg by mouth 2 (two) times a day.   Marland Kitchen omeprazole (PRILOSEC) 20 MG capsule Take 20 mg by mouth daily.  . potassium chloride (K-DUR) 10 MEQ tablet Take 10 mEq by mouth daily.  . pravastatin (PRAVACHOL) 20 MG tablet Take 20 mg by mouth 4 (four) times a week.  . Probiotic Product (PROBIOTIC & ACIDOPHILUS EX ST PO) Take 1 tablet by mouth daily.   . propafenone (RYTHMOL) 150 MG tablet Take 1 tablet (150 mg total) by mouth 2 (two) times daily.  Marland Kitchen torsemide (DEMADEX) 20 MG tablet Take 20 mg by mouth daily.  Marland Kitchen venlafaxine XR (EFFEXOR-XR) 75 MG 24 hr capsule Take 75 mg by mouth 3 (three) times daily.   . Vitamin D, Ergocalciferol, (DRISDOL) 1.25 MG (50000 UT) CAPS capsule Take 1 capsule by mouth once a week.     Allergies:   Prednisone    Social History   Socioeconomic History  . Marital status: Married    Spouse name: Not on file  . Number of children: Not on file  . Years of education: Not on file  . Highest education level: Not on file  Occupational History  . Not on file  Social Needs  . Financial resource strain: Not on file  . Food insecurity    Worry: Not on file    Inability: Not on file  . Transportation needs    Medical: Not on file    Non-medical: Not on file  Tobacco Use  . Smoking status: Never Smoker  . Smokeless tobacco: Never Used  Substance and Sexual Activity  . Alcohol use: No    Comment: very rare  . Drug use: No  . Sexual activity: Not on file  Lifestyle  . Physical activity    Days per week: Not on file    Minutes per  session: Not on file  . Stress: Not on file  Relationships  . Social Herbalist on phone: Not on file    Gets together: Not on file    Attends religious service: Not on file    Active member of club or organization: Not on file    Attends meetings of clubs or organizations: Not on file    Relationship status: Not on file  Other Topics Concern  . Not on file  Social History Narrative  . Not on file     Family History: The patient's  family history includes Atrial fibrillation in her mother; Diabetes in her maternal grandmother; Heart attack in her brother and mother; Heart disease in her brother; Heart failure in her father and paternal grandfather; Stroke in her paternal grandfather. ROS:   Please see the history of present illness.    All other systems reviewed and are negative.  EKGs/Labs/Other Studies Reviewed:    The following studies were reviewed today:  EKG:  EKG ordered today and personally reviewed.  The ekg ordered today demonstrates rate controlled atrial fibrillation normal conduction and QT interval  Recent Labs: 0 12/23/2018 cholesterol 179 HDL 70 LDL 99 creatinine 1.09 hemoglobin 10.9 A1c 7.4 TSH 4.5  Physical Exam:    VS:  BP  138/78 (BP Location: Right Arm, Patient Position: Sitting, Cuff Size: Normal)   Pulse 68   Temp 97.9 F (36.6 C)   Ht 5\' 8"  (1.727 m)   Wt 198 lb 12.8 oz (90.2 kg)   SpO2 96%   BMI 30.23 kg/m     Wt Readings from Last 3 Encounters:  01/23/19 198 lb 12.8 oz (90.2 kg)  12/18/18 195 lb (88.5 kg)  09/11/18 194 lb (88 kg)     GEN:  Well nourished, well developed in no acute distress HEENT: Normal NECK: No JVD; No carotid bruits LYMPHATICS: No lymphadenopathy CARDIAC: Irregular rhythm variable first heart sound   no murmurs, rubs, gallops RESPIRATORY:  Clear to auscultation without rales, wheezing or rhonchi  ABDOMEN: Soft, non-tender, non-distended MUSCULOSKELETAL:  No edema; No deformity  SKIN: Warm and dry NEUROLOGIC:  Alert and oriented x 3 PSYCHIATRIC:  Normal affect    Signed, Shirlee More, MD  01/23/2019 11:51 AM    [place]

## 2019-01-23 ENCOUNTER — Encounter: Payer: Self-pay | Admitting: Cardiology

## 2019-01-23 ENCOUNTER — Other Ambulatory Visit: Payer: Self-pay

## 2019-01-23 ENCOUNTER — Ambulatory Visit (INDEPENDENT_AMBULATORY_CARE_PROVIDER_SITE_OTHER): Payer: Medicare Other | Admitting: Cardiology

## 2019-01-23 VITALS — BP 138/78 | HR 68 | Temp 97.9°F | Ht 68.0 in | Wt 198.8 lb

## 2019-01-23 DIAGNOSIS — Z7901 Long term (current) use of anticoagulants: Secondary | ICD-10-CM

## 2019-01-23 DIAGNOSIS — I119 Hypertensive heart disease without heart failure: Secondary | ICD-10-CM

## 2019-01-23 DIAGNOSIS — I480 Paroxysmal atrial fibrillation: Secondary | ICD-10-CM | POA: Diagnosis not present

## 2019-01-23 MED ORDER — APIXABAN 5 MG PO TABS: 5.0000 mg | ORAL_TABLET | Freq: Two times a day (BID) | ORAL | 0 refills | Status: DC

## 2019-01-23 NOTE — Addendum Note (Signed)
Addended by: Austin Miles on: 01/23/2019 12:15 PM   Modules accepted: Orders

## 2019-01-23 NOTE — Addendum Note (Signed)
Addended by: Austin Miles on: 01/23/2019 02:06 PM   Modules accepted: Orders

## 2019-01-23 NOTE — Patient Instructions (Signed)
Medication Instructions:  Your physician has recommended you make the following change in your medication:   STOP propafenone (rythmol)   If you need a refill on your cardiac medications before your next appointment, please call your pharmacy.   Lab work: None  If you have labs (blood work) drawn today and your tests are completely normal, you will receive your results only by: Marland Kitchen MyChart Message (if you have MyChart) OR . A paper copy in the mail If you have any lab test that is abnormal or we need to change your treatment, we will call you to review the results.  Testing/Procedures: You had an EKG today.   You have been referred to see an electrophysiologist, Dr. Curt Bears. You will be contacted to schedule this appointment.   Follow-Up: At [facility], you and your health needs are our priority.  As part of our continuing mission to provide you with exceptional heart care, we have created designated Provider Care Teams.  These Care Teams include your primary Cardiologist (physician) and Advanced Practice Providers (APPs -  Physician Assistants and Nurse Practitioners) who all work together to provide you with the care you need, when you need it. You will need a follow up appointment in 3 months.

## 2019-02-07 ENCOUNTER — Telehealth: Payer: Self-pay | Admitting: *Deleted

## 2019-02-07 NOTE — Telephone Encounter (Signed)
   Primary Cardiologist: Shirlee More, MD   Pt contacted.  History and symptoms reviewed.  Pt will f/u with [facility] provider as scheduled.  Pt. advised that we are restricting visitors at this time and request that only patients present for check-in prior to their appointment.  All other visitors should remain in their car.  If necessary, only one visitor may come with the patient, into the building. For everyone's safety, all patients and visitor entering our practice area should expect to be screened again prior to entering our waiting area.  Hendrik Donath  02/07/2019 1:01 PM     COVID-19 Pre-Screening Questions:  . In the past 7 to 10 days have you had a cough,  shortness of breath, headache, congestion, fever (100 or greater) body aches, chills, sore throat, or sudden loss of taste or sense of smell? no . Have you been around anyone with known Covid 19? no . Have you been around anyone who is awaiting Covid 19 test results in the past 7 to 10 days? no . Have you been around anyone who has been exposed to Covid 19, or has mentioned symptoms of Covid 19 within the past 7 to 10 days?  no  If you have any concerns/questions about symptoms patients report during screening (either on the phone or at [facility]). Contact the provider seeing the patient or DOD for further guidance.  If neither are available contact a member of the leadership team.

## 2019-02-10 ENCOUNTER — Other Ambulatory Visit: Payer: Self-pay

## 2019-02-10 ENCOUNTER — Ambulatory Visit (INDEPENDENT_AMBULATORY_CARE_PROVIDER_SITE_OTHER): Payer: Medicare Other | Admitting: Cardiology

## 2019-02-10 ENCOUNTER — Encounter: Payer: Self-pay | Admitting: Cardiology

## 2019-02-10 VITALS — BP 156/88 | HR 55 | Ht 68.0 in | Wt 198.2 lb

## 2019-02-10 DIAGNOSIS — I4819 Other persistent atrial fibrillation: Secondary | ICD-10-CM

## 2019-02-10 NOTE — Progress Notes (Signed)
Electrophysiology Office Note   Date:  02/10/2019   ID:  April Munoz, DOB 1950-12-23, MRN [id number]  PCP:  [name], MD  Cardiologist: Bettina Gavia Primary Electrophysiologist:  Constance Haw, MD    No chief complaint on file.    History of Present Illness: April Munoz is a 68 y.o. female who is being seen today for the evaluation of atrial fibrillation at the request of Bettina Gavia, Hilton Cork, MD. Presenting today for electrophysiology evaluation.  She has a history significant for persistent atrial fibrillation, depression, diabetes, hypertension, hyperlipidemia, TIA.  She is currently on propafenone.  She wore a 30-day monitor that showed almost entirely atrial fibrillation.  She does have symptoms of malaise, fatigue, and exercise intolerance.  She has been compliant with anticoagulation.  Per report, she has preserved LV systolic function with moderate left atrial enlargement no valvular heart disease.  Today, she denies symptoms of palpitations, chest pain, shortness of breath, orthopnea, PND, lower extremity edema, claudication, dizziness, presyncope, syncope, bleeding, or neurologic sequela. The patient is tolerating medications without difficulties.    Past Medical History:  Diagnosis Date  . Anxiety   . Arthritis   . Atrial fibrillation ([place])   . B12 deficiency 10/31/2017  . Cellulitis of abdominal wall 07/07/2016  . Depression   . Diabetes mellitus without complication ([place])   . Esophageal stricture 06/05/2017  . Essential hypertension 02/24/2013  . Fibromyalgia 02/24/2013  . Full dentures   . Hereditary and idiopathic peripheral neuropathy 02/24/2013  . Hyperlipidemia 02/24/2013  . Hypertension   . Murmur, cardiac 08/15/2015  . Neuropathy in diabetes ([place]) 10/31/2017  . Osteoporosis   . Paroxysmal atrial fibrillation ([place]) 08/15/2015   CHADS2vasc=3 CHADS2vasc=3  . Restless legs syndrome 03/14/2013  . Thoracic or lumbosacral neuritis or radiculitis 02/24/2013  . Type 2  diabetes mellitus, without long-term current use of insulin ([place]) 02/24/2013  . Wears glasses    Past Surgical History:  Procedure Laterality Date  . BREAST REDUCTION SURGERY  1983  . CATARACT EXTRACTION, BILATERAL  2018, 2019  . CHOLECYSTECTOMY  1971   open  . DIAGNOSTIC LAPAROSCOPY  2010   lysis of adhesions  . DILATION AND CURETTAGE OF UTERUS  2004  . KNEE ARTHROSCOPY  1998   left  . KNEE ARTHROSCOPY W/ LATERAL RETINACULAR REPAIR    . MASS EXCISION Left 11/11/2013   Procedure: EXCISION MUCOID CYST LEFT INDEX FINGER/DEBRIDEMENT LEFT INDEX FINGER;  Surgeon: Wynonia Sours, MD;  Location: [facility];  Service: Orthopedics;  Laterality: Left;  ANESTHESIA: IV REGIONAL/FAB  . SHOULDER ARTHROSCOPY W/ ROTATOR CUFF REPAIR  2007   left  . TRIGGER FINGER RELEASE Left 11/11/2013   Procedure: RELEASE A-1 PULLEY LEFT RING FINGER;  Surgeon: Wynonia Sours, MD;  Location: [facility];  Service: Orthopedics;  Laterality: Left;  . UMBILICAL HERNIA REPAIR  2008, 2010     Current Outpatient Medications  Medication Sig Dispense Refill  . ALPRAZolam (XANAX) 0.5 MG tablet Take 0.5 mg by mouth at bedtime as needed for anxiety.    Marland Kitchen apixaban (ELIQUIS) 5 MG TABS tablet Take 1 tablet (5 mg total) by mouth 2 (two) times daily. 60 tablet 0  . b complex vitamins tablet Take 1 tablet by mouth daily.    Marland Kitchen BIOTIN PO Take by mouth. 800mg  daily    . cyanocobalamin (,VITAMIN B-12,) 1000 MCG/ML injection Inject 1,000 mcg into the muscle every 30 (thirty) days.    Marland Kitchen levothyroxine (SYNTHROID) 50 MCG tablet Take  50 mcg by mouth daily.    . metFORMIN (GLUCOPHAGE) 1000 MG tablet Take 1,000 mg by mouth 2 (two) times daily with a meal.    . metoprolol succinate (TOPROL-XL) 25 MG 24 hr tablet Take 25 mg by mouth 2 (two) times a day.     Marland Kitchen omeprazole (PRILOSEC) 20 MG capsule Take 20 mg by mouth daily.    . potassium chloride (K-DUR) 10 MEQ tablet Take 10 mEq by mouth daily.    . pravastatin  (PRAVACHOL) 20 MG tablet Take 20 mg by mouth 4 (four) times a week.    . Probiotic Product (PROBIOTIC & ACIDOPHILUS EX ST PO) Take 1 tablet by mouth daily.     Marland Kitchen torsemide (DEMADEX) 20 MG tablet Take 20 mg by mouth daily.    Marland Kitchen venlafaxine XR (EFFEXOR-XR) 75 MG 24 hr capsule Take 75 mg by mouth 3 (three) times daily.     . vitamin B-12 (CYANOCOBALAMIN) 1000 MCG tablet Take 1 tablet by mouth once a week.    . Vitamin D, Ergocalciferol, (DRISDOL) 1.25 MG (50000 UT) CAPS capsule Take 1 capsule by mouth once a week.     No current facility-administered medications for this visit.     Allergies:   Prednisone   Social History:  The patient  reports that she has never smoked. She has never used smokeless tobacco. She reports that she does not drink alcohol or use drugs.   Family History:  The patient's family history includes Atrial fibrillation in her mother; Diabetes in her maternal grandmother; Heart attack in her brother and mother; Heart disease in her brother; Heart failure in her father and paternal grandfather; Stroke in her paternal grandfather.    ROS:  Please see the history of present illness.   Otherwise, review of systems is positive for none.   All other systems are reviewed and negative.    PHYSICAL EXAM: VS:  BP (!) 156/88   Pulse (!) 55   Ht 5\' 8"  (1.727 m)   Wt 198 lb 3.2 oz (89.9 kg)   SpO2 98%   BMI 30.14 kg/m  , BMI Body mass index is 30.14 kg/m. GEN: Well nourished, well developed, in no acute distress  HEENT: normal  Neck: no JVD, carotid bruits, or masses Cardiac: iRRR; no murmurs, rubs, or gallops,no edema  Respiratory:  clear to auscultation bilaterally, normal work of breathing GI: soft, nontender, nondistended, + BS MS: no deformity or atrophy  Skin: warm and dry Neuro:  Strength and sensation are intact Psych: euthymic mood, full affect  EKG:  EKG is ordered today. Personal review of the ekg ordered shows atrial fibrillation, rate 53  Recent Labs: No  results found for requested labs within last 8760 hours.    Lipid Panel  No results found for: CHOL, TRIG, HDL, CHOLHDL, VLDL, LDLCALC, LDLDIRECT   Wt Readings from Last 3 Encounters:  02/10/19 198 lb 3.2 oz (89.9 kg)  01/23/19 198 lb 12.8 oz (90.2 kg)  12/18/18 195 lb (88.5 kg)      Other studies Reviewed: Additional studies/ records that were reviewed today include: Monitor 01/29/2019 personally reviewed Review of the above records today demonstrates:  1. atrial fibrillation with good heart rate control 2.  Rare ventricular ectopy   ASSESSMENT AND PLAN:  1.  Persistent atrial fibrillation: Currently on Eliquis and propafenone.  She unfortunately has continued episodes of atrial fibrillation.  I discussed with her the possibility of further therapy including admission for dofetilide loading versus ablation.  Her left atrium is moderately enlarged.  Risks and benefits of ablation include bleeding, tamponade, heart block, stroke, damage surrounding organs.  She understands the risks of the procedure and is agreed.  This patients CHA2DS2-VASc Score and unadjusted Ischemic Stroke Rate (% per year) is equal to 9.7 % stroke rate/year from a score of 6  Above score calculated as 1 point each if present [CHF, HTN, DM, Vascular=MI/PAD/Aortic Plaque, Age if 65-74, or Female] Above score calculated as 2 points each if present [Age > 75, or Stroke/TIA/TE]  2.  Hypertension: Blood pressure is elevated today but is usually more normal.  No changes.    Current medicines are reviewed at length with the patient today.   The patient does not have concerns regarding her medicines.  The following changes were made today:  none  Labs/ tests ordered today include:  Orders Placed This Encounter  Procedures  . EKG 12-Lead   Is discussed with referring  Disposition:   FU with Willona Phariss 3 months  Signed, Zakariah Urwin Meredith Leeds, MD  02/10/2019 9:01 AM     [facility] [address] [place] [address] [phone] (office) [phone] (fax)

## 2019-02-10 NOTE — Patient Instructions (Signed)
Medication Instructions:  Your physician recommends that you continue on your current medications as directed. Please refer to the Current Medication list given to you today.  *If you need a refill on your cardiac medications before your next appointment, please call your pharmacy.  Labwork: You will get lab work within 30 days of your procedure:  BMP & CBC *Will notify you of abnormal results, otherwise continue current treatment plan.  Testing/Procedures: Your physician has requested that you have cardiac CT within 7 days prior to your ablation. Cardiac computed tomography (CT) is a painless test that uses an x-ray machine to take clear, detailed pictures of your heart. For further information please visit [facility]. Please follow instruction below located under special instructions. You will get a call from our office to schedule the date for this test.  Your physician has recommended that you have an ablation. Catheter ablation is a medical procedure used to treat some cardiac arrhythmias (irregular heartbeats). During catheter ablation, a long, thin, flexible tube is put into a blood vessel in your groin (upper thigh), or neck. This tube is called an ablation catheter. It is then guided to your heart through the blood vessel. Radio frequency waves destroy small areas of heart tissue where abnormal heartbeats may cause an arrhythmia to start. Please see the instructions below located under special instructions  Follow-Up: Your physician recommends that you schedule a follow-up appointment in: 4 weeks, after your procedure on _______, with Roderic Palau NP in the AFib clinic.  Your physician recommends that you schedule a follow up appointment in: 3 months, after your procedure on ______, with Dr. Curt Bears.  *Please note that any paperwork needing to be filled out by the provider will need to be addressed at the front desk prior to seeing the provider. Please note that any FMLA,  disability or other documents regarding health condition is subject to a $25.00 charge that must be received prior to completion of paperwork in the form of a money order or check.  Thank you for choosing [facility]!! Trinidad Curet, RN [phone]   Any Other Special Instructions Will Be Listed Below      CARDIAC CT INSTRUCTIONS:  Please arrive at the [facility] main entrance of [facility] at _______ AM (30-45 minutes prior to test start time) [facility] [address] [place], [place] [address] [phone]  Proceed to the [facility] Radiology Department (First Floor).  Please follow these instructions carefully (unless otherwise directed):  Hold all erectile dysfunction medications at least 48 hours prior to test.  On the Night Before the Test: . Drink plenty of water. . Do not consume any caffeinated/decaffeinated beverages or chocolate 12 hours prior to your test. . Do not take any antihistamines 12 hours prior to your test. . If you take Metformin do not take 24 hours prior to test. . If the patient has contrast allergy: ? Patient will need a prescription for Prednisone and very clear instructions (as follows): 1. Prednisone 50 mg - take 13 hours prior to test 2. Take another Prednisone 50 mg 7 hours prior to test 3. Take another Prednisone 50 mg 1 hour prior to test 4. Take Benadryl 50 mg 1 hour prior to test . Patient must complete all four doses of above prophylactic medications . Patient will need a ride after test due to Benadryl.  On the Day of the Test: . Drink plenty of water. Do not drink any water within one hour of  the test. . Do not eat any food 4 hours prior to the test. . You may take your regular medications prior to the test. . TAKE YOUR TOPROL THE MORNING OF THIS TEST . HOLD Furosemide morning of the test.  After the Test: . Drink plenty of water. . After receiving IV contrast, you may experience a mild flushed  feeling. This is normal. . On occasion, you may experience a mild rash up to 24 hours after the test. This is not dangerous. If this occurs, you can take Benadryl 25 mg and increase your fluid intake. . If you experience trouble breathing, this can be serious. If it is severe call 911 IMMEDIATELY. If it is mild, please call our office. . If you take any of these medications: Glipizide/Metformin, Avandament, Glucavance, please do not take 48 hours after completing test.    Instructions for your ablation: 1. Please arrive at the [facility], Main Entrance "A", of [facility] at _____ on _______. 2. Do not eat or drink after midnight the night prior to the procedure. 3. Do not miss any doses of ELIQUIS prior to the morning of the procedure.  4. Do not take any medications the morning of the procedure. 5. Both of your groins will need to be shaved for this procedure (if needed). We ask that you do this yourself at home 1-2 days prior to the procedure.  If you are unable/uncomfortable to do yourself, the hospital staff will shave you the day of your procedure (if needed). 6. Plan for an overnight stay in the hospital. 7. You will need someone to drive you home at discharge.

## 2019-02-19 DIAGNOSIS — H04123 Dry eye syndrome of bilateral lacrimal glands: Secondary | ICD-10-CM | POA: Diagnosis not present

## 2019-02-28 ENCOUNTER — Telehealth: Payer: Self-pay | Admitting: *Deleted

## 2019-02-28 DIAGNOSIS — I4819 Other persistent atrial fibrillation: Secondary | ICD-10-CM

## 2019-02-28 NOTE — Telephone Encounter (Signed)
Pt agreeable to AFib ablation on 9/4. Pt aware I would review instructions by next week.

## 2019-03-06 ENCOUNTER — Telehealth: Payer: Self-pay | Admitting: *Deleted

## 2019-03-06 ENCOUNTER — Encounter: Payer: Self-pay | Admitting: *Deleted

## 2019-03-06 DIAGNOSIS — I4891 Unspecified atrial fibrillation: Secondary | ICD-10-CM

## 2019-03-06 DIAGNOSIS — Z01812 Encounter for preprocedural laboratory examination: Secondary | ICD-10-CM

## 2019-03-06 NOTE — Telephone Encounter (Signed)
Reviewed ablation & CT instructions w/ pt. Letter of instructions left for pt to pick up next week when she goes by [facility] office for pre procedure lab work. Covid testing scheduled for 9/1 and directions/instructions reviewed w/ pt. Aware office will contact her to arrange post ablation follow up. Patient verbalized understanding and agreeable to plan.

## 2019-03-07 ENCOUNTER — Other Ambulatory Visit: Payer: Self-pay | Admitting: *Deleted

## 2019-03-07 DIAGNOSIS — I4891 Unspecified atrial fibrillation: Secondary | ICD-10-CM

## 2019-03-07 DIAGNOSIS — Z01812 Encounter for preprocedural laboratory examination: Secondary | ICD-10-CM

## 2019-03-12 DIAGNOSIS — Z01812 Encounter for preprocedural laboratory examination: Secondary | ICD-10-CM | POA: Diagnosis not present

## 2019-03-12 DIAGNOSIS — I4891 Unspecified atrial fibrillation: Secondary | ICD-10-CM | POA: Diagnosis not present

## 2019-03-12 LAB — CBC
Hematocrit: 36.5 % (ref 34.0–46.6)
Hemoglobin: 11.6 g/dL (ref 11.1–15.9)
MCH: 27.7 pg (ref 26.6–33.0)
MCHC: 31.8 g/dL (ref 31.5–35.7)
MCV: 87 fL (ref 79–97)
Platelets: 350 10*3/uL (ref 150–450)
RBC: 4.19 x10E6/uL (ref 3.77–5.28)
RDW: 14.6 % (ref 11.7–15.4)
WBC: 7.5 10*3/uL (ref 3.4–10.8)

## 2019-03-13 ENCOUNTER — Telehealth (HOSPITAL_COMMUNITY): Payer: Self-pay | Admitting: Emergency Medicine

## 2019-03-13 LAB — BASIC METABOLIC PANEL
BUN/Creatinine Ratio: 15 (ref 12–28)
BUN: 15 mg/dL (ref 8–27)
CO2: 21 mmol/L (ref 20–29)
Calcium: 9.7 mg/dL (ref 8.7–10.3)
Chloride: 107 mmol/L — ABNORMAL HIGH (ref 96–106)
Creatinine, Ser: 0.97 mg/dL (ref 0.57–1.00)
GFR calc Af Amer: 68 mL/min/{1.73_m2} (ref >59)
GFR calc non Af Amer: 59 mL/min/{1.73_m2} — ABNORMAL LOW (ref >59)
Glucose: 121 mg/dL — ABNORMAL HIGH (ref 65–99)
Potassium: 5.5 mmol/L — ABNORMAL HIGH (ref 3.5–5.2)
Sodium: 141 mmol/L (ref 134–144)

## 2019-03-13 NOTE — Telephone Encounter (Signed)
Reaching out to patient to offer assistance regarding upcoming cardiac imaging study; pt verbalizes understanding of appt date/time, parking situation and where to check in, pre-test NPO status and medications ordered, and verified current allergies; name and call back number provided for further questions should they arise Opie Fanton RN Navigator Cardiac Imaging [facility] [phone] office [phone] cell 

## 2019-03-14 ENCOUNTER — Ambulatory Visit (HOSPITAL_COMMUNITY)
Admission: RE | Admit: 2019-03-14 | Discharge: 2019-03-14 | Disposition: A | Discharge status: Home / Self Care | Payer: Medicare Other | Source: Ambulatory Visit | Attending: Cardiology | Admitting: Cardiology

## 2019-03-14 ENCOUNTER — Other Ambulatory Visit: Payer: Self-pay

## 2019-03-14 ENCOUNTER — Ambulatory Visit (HOSPITAL_COMMUNITY): Payer: Medicare Other

## 2019-03-14 DIAGNOSIS — I4819 Other persistent atrial fibrillation: Secondary | ICD-10-CM | POA: Insufficient documentation

## 2019-03-14 MED ORDER — IOHEXOL 350 MG/ML SOLN
100.0000 mL | Freq: Once | INTRAVENOUS | Status: AC | PRN
  Administered 2019-03-14: 100 mL via INTRAVENOUS

## 2019-03-18 ENCOUNTER — Other Ambulatory Visit (HOSPITAL_COMMUNITY)
Admission: RE | Admit: 2019-03-18 | Discharge: 2019-03-18 | Disposition: A | Discharge status: Home / Self Care | Payer: Medicare Other | Source: Ambulatory Visit | Attending: Cardiology | Admitting: Cardiology

## 2019-03-18 DIAGNOSIS — Z23 Encounter for immunization: Secondary | ICD-10-CM | POA: Diagnosis not present

## 2019-03-18 DIAGNOSIS — Z01812 Encounter for preprocedural laboratory examination: Secondary | ICD-10-CM | POA: Diagnosis not present

## 2019-03-18 DIAGNOSIS — Z20828 Contact with and (suspected) exposure to other viral communicable diseases: Secondary | ICD-10-CM | POA: Insufficient documentation

## 2019-03-18 LAB — SARS CORONAVIRUS 2 (TAT 6-24 HRS): SARS Coronavirus 2: NEGATIVE

## 2019-03-20 ENCOUNTER — Other Ambulatory Visit: Payer: Self-pay | Admitting: Cardiology

## 2019-03-21 ENCOUNTER — Ambulatory Visit (HOSPITAL_COMMUNITY)
Admission: RE | Admit: 2019-03-21 | Discharge: 2019-03-21 | Disposition: A | Discharge status: Home / Self Care | Payer: Medicare Other | Attending: Cardiology | Admitting: Cardiology

## 2019-03-21 ENCOUNTER — Ambulatory Visit (HOSPITAL_COMMUNITY): Payer: Medicare Other | Admitting: Certified Registered Nurse Anesthetist

## 2019-03-21 ENCOUNTER — Encounter (HOSPITAL_COMMUNITY)
Admission: RE | Disposition: A | Discharge status: Home / Self Care | Payer: Self-pay | Source: Home / Self Care | Attending: Cardiology

## 2019-03-21 ENCOUNTER — Other Ambulatory Visit: Payer: Self-pay

## 2019-03-21 DIAGNOSIS — F329 Major depressive disorder, single episode, unspecified: Secondary | ICD-10-CM | POA: Insufficient documentation

## 2019-03-21 DIAGNOSIS — G2581 Restless legs syndrome: Secondary | ICD-10-CM | POA: Diagnosis not present

## 2019-03-21 DIAGNOSIS — E119 Type 2 diabetes mellitus without complications: Secondary | ICD-10-CM | POA: Diagnosis not present

## 2019-03-21 DIAGNOSIS — Z8673 Personal history of transient ischemic attack (TIA), and cerebral infarction without residual deficits: Secondary | ICD-10-CM | POA: Diagnosis not present

## 2019-03-21 DIAGNOSIS — M797 Fibromyalgia: Secondary | ICD-10-CM | POA: Diagnosis not present

## 2019-03-21 DIAGNOSIS — E1140 Type 2 diabetes mellitus with diabetic neuropathy, unspecified: Secondary | ICD-10-CM | POA: Insufficient documentation

## 2019-03-21 DIAGNOSIS — E785 Hyperlipidemia, unspecified: Secondary | ICD-10-CM | POA: Insufficient documentation

## 2019-03-21 DIAGNOSIS — I10 Essential (primary) hypertension: Secondary | ICD-10-CM | POA: Insufficient documentation

## 2019-03-21 DIAGNOSIS — Z8249 Family history of ischemic heart disease and other diseases of the circulatory system: Secondary | ICD-10-CM | POA: Diagnosis not present

## 2019-03-21 DIAGNOSIS — F419 Anxiety disorder, unspecified: Secondary | ICD-10-CM | POA: Diagnosis not present

## 2019-03-21 DIAGNOSIS — I4819 Other persistent atrial fibrillation: Secondary | ICD-10-CM | POA: Insufficient documentation

## 2019-03-21 DIAGNOSIS — Z888 Allergy status to other drugs, medicaments and biological substances status: Secondary | ICD-10-CM | POA: Diagnosis not present

## 2019-03-21 DIAGNOSIS — Z7901 Long term (current) use of anticoagulants: Secondary | ICD-10-CM | POA: Insufficient documentation

## 2019-03-21 DIAGNOSIS — M1990 Unspecified osteoarthritis, unspecified site: Secondary | ICD-10-CM | POA: Insufficient documentation

## 2019-03-21 DIAGNOSIS — I4891 Unspecified atrial fibrillation: Secondary | ICD-10-CM | POA: Diagnosis not present

## 2019-03-21 HISTORY — PX: ATRIAL FIBRILLATION ABLATION: EP1191

## 2019-03-21 LAB — BASIC METABOLIC PANEL
Anion gap: 11 (ref 5–15)
BUN: 11 mg/dL (ref 8–23)
CO2: 22 mmol/L (ref 22–32)
Calcium: 8.8 mg/dL — ABNORMAL LOW (ref 8.9–10.3)
Chloride: 108 mmol/L (ref 98–111)
Creatinine, Ser: 0.92 mg/dL (ref 0.44–1.00)
GFR calc Af Amer: >60 mL/min (ref >60)
GFR calc non Af Amer: >60 mL/min (ref >60)
Glucose, Bld: 127 mg/dL — ABNORMAL HIGH (ref 70–99)
Potassium: 4.2 mmol/L (ref 3.5–5.1)
Sodium: 141 mmol/L (ref 135–145)

## 2019-03-21 LAB — GLUCOSE, CAPILLARY
Glucose-Capillary: 132 mg/dL — ABNORMAL HIGH (ref 70–99)
Glucose-Capillary: 138 mg/dL — ABNORMAL HIGH (ref 70–99)
Glucose-Capillary: 120 mg/dL — ABNORMAL HIGH (ref 70–99)

## 2019-03-21 LAB — POCT ACTIVATED CLOTTING TIME
Activated Clotting Time: 246 seconds
Activated Clotting Time: 290 seconds
Activated Clotting Time: 164 seconds

## 2019-03-21 SURGERY — ATRIAL FIBRILLATION ABLATION
Anesthesia: General

## 2019-03-21 MED ORDER — HEPARIN (PORCINE) IN NACL 1000-0.9 UT/500ML-% IV SOLN
INTRAVENOUS | Status: AC
  Filled 2019-03-21: qty 500

## 2019-03-21 MED ORDER — SODIUM CHLORIDE 0.9% FLUSH: 3.0000 mL | INTRAVENOUS | Status: DC | PRN

## 2019-03-21 MED ORDER — LIDOCAINE 2% (20 MG/ML) 5 ML SYRINGE
INTRAMUSCULAR | Status: DC | PRN
  Administered 2019-03-21: 80 mg via INTRAVENOUS

## 2019-03-21 MED ORDER — PHENYLEPHRINE HCL (PRESSORS) 10 MG/ML IV SOLN
INTRAVENOUS | Status: DC | PRN
  Administered 2019-03-21 (×2): 80 ug via INTRAVENOUS

## 2019-03-21 MED ORDER — DOBUTAMINE IN D5W 4-5 MG/ML-% IV SOLN
INTRAVENOUS | Status: AC
  Filled 2019-03-21: qty 250

## 2019-03-21 MED ORDER — ACETAMINOPHEN 325 MG PO TABS: 650.0000 mg | ORAL_TABLET | ORAL | Status: DC | PRN

## 2019-03-21 MED ORDER — HEPARIN SODIUM (PORCINE) 1000 UNIT/ML IJ SOLN
INTRAMUSCULAR | Status: DC | PRN
  Administered 2019-03-21: 1000 [IU] via INTRAVENOUS

## 2019-03-21 MED ORDER — DOBUTAMINE IN D5W 4-5 MG/ML-% IV SOLN
INTRAVENOUS | Status: DC | PRN
  Administered 2019-03-21: 20 ug/kg/min via INTRAVENOUS

## 2019-03-21 MED ORDER — HEPARIN (PORCINE) IN NACL 1000-0.9 UT/500ML-% IV SOLN
INTRAVENOUS | Status: DC | PRN
  Administered 2019-03-21 (×5): 500 mL

## 2019-03-21 MED ORDER — SODIUM CHLORIDE 0.9 % IV SOLN: 250.0000 mL | INTRAVENOUS | Status: DC | PRN

## 2019-03-21 MED ORDER — FENTANYL CITRATE (PF) 250 MCG/5ML IJ SOLN
INTRAMUSCULAR | Status: DC | PRN
  Administered 2019-03-21: 100 ug via INTRAVENOUS

## 2019-03-21 MED ORDER — PROTAMINE SULFATE 10 MG/ML IV SOLN
INTRAVENOUS | Status: DC | PRN
  Administered 2019-03-21: 50 mg via INTRAVENOUS

## 2019-03-21 MED ORDER — PROPOFOL 10 MG/ML IV BOLUS
INTRAVENOUS | Status: DC | PRN
  Administered 2019-03-21: 170 mg via INTRAVENOUS

## 2019-03-21 MED ORDER — SODIUM CHLORIDE 0.9 % IV SOLN
INTRAVENOUS | Status: DC | PRN
  Administered 2019-03-21: 25 ug/min via INTRAVENOUS

## 2019-03-21 MED ORDER — HEPARIN SODIUM (PORCINE) 1000 UNIT/ML IJ SOLN
INTRAMUSCULAR | Status: AC
  Filled 2019-03-21: qty 1

## 2019-03-21 MED ORDER — ONDANSETRON HCL 4 MG/2ML IJ SOLN
INTRAMUSCULAR | Status: DC | PRN
  Administered 2019-03-21: 4 mg via INTRAVENOUS

## 2019-03-21 MED ORDER — ONDANSETRON HCL 4 MG/2ML IJ SOLN: 4.0000 mg | Freq: Four times a day (QID) | INTRAMUSCULAR | Status: DC | PRN

## 2019-03-21 MED ORDER — BUPIVACAINE HCL (PF) 0.25 % IJ SOLN
INTRAMUSCULAR | Status: DC | PRN
  Administered 2019-03-21: 30 mL

## 2019-03-21 MED ORDER — HEPARIN SODIUM (PORCINE) 1000 UNIT/ML IJ SOLN
INTRAMUSCULAR | Status: DC | PRN
  Administered 2019-03-21: 6000 [IU] via INTRAVENOUS
  Administered 2019-03-21: 14000 [IU] via INTRAVENOUS
  Administered 2019-03-21: 4000 [IU] via INTRAVENOUS

## 2019-03-21 MED ORDER — SODIUM CHLORIDE 0.9 % IV SOLN
INTRAVENOUS | Status: DC
  Administered 2019-03-21: 10:00:00 via INTRAVENOUS

## 2019-03-21 MED ORDER — ROCURONIUM BROMIDE 50 MG/5ML IV SOSY
PREFILLED_SYRINGE | INTRAVENOUS | Status: DC | PRN
  Administered 2019-03-21 (×2): 10 mg via INTRAVENOUS
  Administered 2019-03-21: 50 mg via INTRAVENOUS

## 2019-03-21 MED ORDER — SODIUM CHLORIDE 0.9% FLUSH: 3.0000 mL | Freq: Two times a day (BID) | INTRAVENOUS | Status: DC

## 2019-03-21 MED ORDER — EPHEDRINE SULFATE 50 MG/ML IJ SOLN
INTRAMUSCULAR | Status: DC | PRN
  Administered 2019-03-21 (×4): 5 mg via INTRAVENOUS

## 2019-03-21 MED ORDER — SUGAMMADEX SODIUM 200 MG/2ML IV SOLN
INTRAVENOUS | Status: DC | PRN
  Administered 2019-03-21: 179.6 mg via INTRAVENOUS

## 2019-03-21 MED ORDER — BUPIVACAINE HCL (PF) 0.25 % IJ SOLN
INTRAMUSCULAR | Status: AC
  Filled 2019-03-21: qty 30

## 2019-03-21 SURGICAL SUPPLY — 20 items
BLANKET WARM UNDERBOD FULL ACC (MISCELLANEOUS) ×2 IMPLANT
CATH MAPPNG PENTARAY F 2-6-2MM (CATHETERS) IMPLANT
CATH SMTCH THERMOCOOL SF DF (CATHETERS) ×1 IMPLANT
CATH SOUNDSTAR 3D IMAGING (CATHETERS) ×1 IMPLANT
CATH WEBSTER BI DIR CS D-F CRV (CATHETERS) ×1 IMPLANT
COVER SWIFTLINK CONNECTOR (BAG) ×2 IMPLANT
PACK EP LATEX FREE (CUSTOM PROCEDURE TRAY) ×2
PACK EP LF (CUSTOM PROCEDURE TRAY) ×1 IMPLANT
PAD PRO RADIOLUCENT 2001M-C (PAD) ×2 IMPLANT
PATCH CARTO3 (PAD) ×1 IMPLANT
PENTARAY F 2-6-2MM (CATHETERS) ×2
SHEATH AVANTI 11F 11CM (SHEATH) ×1 IMPLANT
SHEATH BAYLIS SUREFLEX  M 8.5 (SHEATH) ×1
SHEATH BAYLIS SUREFLEX M 8.5 (SHEATH) IMPLANT
SHEATH BAYLIS TRANSSEPTAL 98CM (NEEDLE) ×1 IMPLANT
SHEATH CARTO VIZIGO SM CVD (SHEATH) ×1 IMPLANT
SHEATH PINNACLE 7F 10CM (SHEATH) ×1 IMPLANT
SHEATH PINNACLE 8F 10CM (SHEATH) ×2 IMPLANT
SHEATH PINNACLE 9F 10CM (SHEATH) ×2 IMPLANT
TUBING SMART ABLATE COOLFLOW (TUBING) ×1 IMPLANT

## 2019-03-21 NOTE — Progress Notes (Signed)
Dr. Curt Bears in to see patient; aware of c/o 3/10 left sided chest pressure.

## 2019-03-21 NOTE — Progress Notes (Signed)
Site area: left groin fv sheaths x2 Site Prior to Removal:  Level 0 Pressure Applied For: 15 minutes Manual:   yes Patient Status During Pull:  stable Post Pull Site:  Level 0 Post Pull Instructions Given:  yes Post Pull Pulses Present: left dp palpable Dressing Applied:  Gauze and tegaderm Bedrest begins @ [id number] Comments: IV saline locked

## 2019-03-21 NOTE — Progress Notes (Signed)
Up and walked and tolerated well; bilat groins stable, no bleeding or hematoma 

## 2019-03-21 NOTE — Anesthesia Preprocedure Evaluation (Signed)
Anesthesia Evaluation  Patient identified by MRN, date of birth, ID band Patient awake    Reviewed: Allergy & Precautions, NPO status , Patient's Chart, lab work & pertinent test results  Airway Mallampati: II  TM Distance: >3 FB Neck ROM: Full    Dental  (+) Edentulous Upper, Edentulous Lower   Pulmonary    breath sounds clear to auscultation       Cardiovascular hypertension,  Rhythm:Irregular Rate:Normal     Neuro/Psych    GI/Hepatic   Endo/Other  diabetes  Renal/GU      Musculoskeletal   Abdominal (+) + obese,   Peds  Hematology   Anesthesia Other Findings   Reproductive/Obstetrics                             Anesthesia Physical Anesthesia Plan  ASA: III  Anesthesia Plan: General   Post-op Pain Management:    Induction: Intravenous  PONV Risk Score and Plan: Ondansetron  Airway Management Planned: Oral ETT  Additional Equipment:   Intra-op Plan:   Post-operative Plan: Extubation in OR  Informed Consent: I have reviewed the patients History and Physical, chart, labs and discussed the procedure including the risks, benefits and alternatives for the proposed anesthesia with the patient or authorized representative who has indicated his/her understanding and acceptance.       Plan Discussed with: CRNA and Anesthesiologist  Anesthesia Plan Comments:         Anesthesia Quick Evaluation

## 2019-03-21 NOTE — Anesthesia Procedure Notes (Signed)
Procedure Name: Intubation Date/Time: 03/21/2019 10:53 AM Performed by: Glynda Jaeger, CRNA Pre-anesthesia Checklist: Patient identified, Emergency Drugs available, Suction available and Patient being monitored Patient Re-evaluated:Patient Re-evaluated prior to induction Oxygen Delivery Method: Circle system utilized Preoxygenation: Pre-oxygenation with 100% oxygen Induction Type: IV induction Ventilation: Mask ventilation without difficulty and Oral airway inserted - appropriate to patient size Laryngoscope Size: Mac and 3 Grade View: Grade I Tube type: Oral Tube size: 7.0 mm Number of attempts: 1 Airway Equipment and Method: Stylet Placement Confirmation: ETT inserted through vocal cords under direct vision,  positive ETCO2 and breath sounds checked- equal and bilateral Secured at: 21 cm Tube secured with: Tape Dental Injury: Teeth and Oropharynx as per pre-operative assessment

## 2019-03-21 NOTE — Anesthesia Postprocedure Evaluation (Signed)
Anesthesia Post Note  Patient: April Munoz  Procedure(s) Performed: ATRIAL FIBRILLATION ABLATION (N/A )     Patient location during evaluation: Cath Lab Anesthesia Type: General Level of consciousness: awake and alert Pain management: pain level controlled Vital Signs Assessment: post-procedure vital signs reviewed and stable Respiratory status: spontaneous breathing, nonlabored ventilation, respiratory function stable and patient connected to nasal cannula oxygen Cardiovascular status: blood pressure returned to baseline and stable Postop Assessment: no apparent nausea or vomiting Anesthetic complications: no    Last Vitals:  Vitals:   03/21/19 1513 03/21/19 1515  BP: (!) 134/59 136/68  Pulse: (!) 47 (!) 47  Resp: 15 (!) 8  Temp:    SpO2: 95% 94%    Last Pain:  Vitals:   03/21/19 1522  TempSrc:   PainSc: 4                  Althia Egolf COKER

## 2019-03-21 NOTE — Discharge Instructions (Signed)
Cardiac Ablation, Care After This sheet gives you information about how to care for yourself after your procedure. Your health care provider may also give you more specific instructions. If you have problems or questions, contact your health care provider. What can I expect after the procedure? After the procedure, it is common to have:  Bruising around your puncture site.  Tenderness around your puncture site.  Skipped heartbeats.  Tiredness (fatigue). Follow these instructions at home: Puncture site care   Follow instructions from your health care provider about how to take care of your puncture site. Make sure you: ? Wash your hands with soap and water before you change your bandage (dressing). If soap and water are not available, use hand sanitizer. ? Change your dressing as told by your health care provider. ? Leave stitches (sutures), skin glue, or adhesive strips in place. These skin closures may need to stay in place for up to 2 weeks. If adhesive strip edges start to loosen and curl up, you may trim the loose edges. Do not remove adhesive strips completely unless your health care provider tells you to do that.  Check your puncture site every day for signs of infection. Check for: ? Redness, swelling, or pain. ? Fluid or blood. If your puncture site starts to bleed, lie down on your back, apply firm pressure to the area, and contact your health care provider. ? Warmth. ? Pus or a bad smell. Driving  Ask your health care provider when it is safe for you to drive again after the procedure.  Do not drive or use heavy machinery while taking prescription pain medicine.  Do not drive for 24 hours if you were given a medicine to help you relax (sedative) during your procedure. Activity  Avoid activities that take a lot of effort for at least 3 days after your procedure.  Do not lift anything that is heavier than 10 lb (4.5 kg), or the limit that you are told, until your health  care provider says that it is safe.  Return to your normal activities as told by your health care provider. Ask your health care provider what activities are safe for you. General instructions  Take over-the-counter and prescription medicines only as told by your health care provider.  Do not use any products that contain nicotine or tobacco, such as cigarettes and e-cigarettes. If you need help quitting, ask your health care provider.  Do not take baths, swim, or use a hot tub until your health care provider approves.  Do not drink alcohol for 24 hours after your procedure.  Keep all follow-up visits as told by your health care provider. This is important. Contact a health care provider if:  You have redness, mild swelling, or pain around your puncture site.  You have fluid or blood coming from your puncture site that stops after applying firm pressure to the area.  Your puncture site feels warm to the touch.  You have pus or a bad smell coming from your puncture site.  You have a fever.  You have chest pain or discomfort that spreads to your neck, jaw, or arm.  You are sweating a lot.  You feel nauseous.  You have a fast or irregular heartbeat.  You have shortness of breath.  You are dizzy or light-headed and feel the need to lie down.  You have pain or numbness in the arm or leg closest to your puncture site. Get help right away if:  Your puncture  site suddenly swells. °· Your puncture site is bleeding and the bleeding does not stop after applying firm pressure to the area. °These symptoms may represent a serious problem that is an emergency. Do not wait to see if the symptoms will go away. Get medical help right away. Call your local emergency services (911 in the [place] Do not drive yourself to the hospital. °Summary °· After the procedure, it is normal to have bruising and tenderness at the puncture site in your groin, neck, or forearm. °· Check your puncture site every  day for signs of infection. °· Get help right away if your puncture site is bleeding and the bleeding does not stop after applying firm pressure to the area. This is a medical emergency. °This information is not intended to replace advice given to you by your health care provider. Make sure you discuss any questions you have with your health care provider. °Document Released: 10/12/2016 Document Revised: 06/15/2017 Document Reviewed: 10/12/2016 °Elsevier Patient Education © 2020 [employer] ° °

## 2019-03-21 NOTE — H&P (Addendum)
Electrophysiology Office Note   Date:  03/21/2019   ID:  April Munoz, DOB 10-03-1950, MRN [id number]  PCP:  [name], MD  Cardiologist: Bettina Gavia Primary Electrophysiologist:  Constance Haw, MD    No chief complaint on file.    History of Present Illness: April Munoz is a 68 y.o. female who is being seen today for the evaluation of atrial fibrillation at the request of No ref. provider found. Presenting today for electrophysiology evaluation.  She has a history significant for persistent atrial fibrillation, depression, diabetes, hypertension, hyperlipidemia, TIA.  She is currently on propafenone.  She wore a 30-day monitor that showed almost entirely atrial fibrillation.  She does have symptoms of malaise, fatigue, and exercise intolerance.  She has been compliant with anticoagulation.  Per report, she has preserved LV systolic function with moderate left atrial enlargement no valvular heart disease.  Today, denies symptoms of palpitations, chest pain, shortness of breath, orthopnea, PND, lower extremity edema, claudication, dizziness, presyncope, syncope, bleeding, or neurologic sequela. The patient is tolerating medications without difficulties. Plan for AF ablation today.   Past Medical History:  Diagnosis Date  . Anxiety   . Arthritis   . Atrial fibrillation ([place])   . B12 deficiency 10/31/2017  . Cellulitis of abdominal wall 07/07/2016  . Depression   . Diabetes mellitus without complication ([place])   . Esophageal stricture 06/05/2017  . Essential hypertension 02/24/2013  . Fibromyalgia 02/24/2013  . Full dentures   . Hereditary and idiopathic peripheral neuropathy 02/24/2013  . Hyperlipidemia 02/24/2013  . Hypertension   . Murmur, cardiac 08/15/2015  . Neuropathy in diabetes ([place]) 10/31/2017  . Osteoporosis   . Paroxysmal atrial fibrillation ([place]) 08/15/2015   CHADS2vasc=3 CHADS2vasc=3  . Restless legs syndrome 03/14/2013  . Thoracic or lumbosacral neuritis or  radiculitis 02/24/2013  . Type 2 diabetes mellitus, without long-term current use of insulin ([place]) 02/24/2013  . Wears glasses    Past Surgical History:  Procedure Laterality Date  . BREAST REDUCTION SURGERY  1983  . CATARACT EXTRACTION, BILATERAL  2018, 2019  . CHOLECYSTECTOMY  1971   open  . DIAGNOSTIC LAPAROSCOPY  2010   lysis of adhesions  . DILATION AND CURETTAGE OF UTERUS  2004  . KNEE ARTHROSCOPY  1998   left  . KNEE ARTHROSCOPY W/ LATERAL RETINACULAR REPAIR    . MASS EXCISION Left 11/11/2013   Procedure: EXCISION MUCOID CYST LEFT INDEX FINGER/DEBRIDEMENT LEFT INDEX FINGER;  Surgeon: Wynonia Sours, MD;  Location: [facility];  Service: Orthopedics;  Laterality: Left;  ANESTHESIA: IV REGIONAL/FAB  . SHOULDER ARTHROSCOPY W/ ROTATOR CUFF REPAIR  2007   left  . TRIGGER FINGER RELEASE Left 11/11/2013   Procedure: RELEASE A-1 PULLEY LEFT RING FINGER;  Surgeon: Wynonia Sours, MD;  Location: [facility];  Service: Orthopedics;  Laterality: Left;  . UMBILICAL HERNIA REPAIR  2008, 2010     Current Facility-Administered Medications  Medication Dose Route Frequency Provider Last Rate Last Dose  . 0.9 %  sodium chloride infusion   Intravenous Continuous Constance Haw, MD 50 mL/hr at 03/21/19 0932      Allergies:   Prednisone   Social History:  The patient  reports that she has never smoked. She has never used smokeless tobacco. She reports that she does not drink alcohol or use drugs.   Family History:  The patient's family history includes Atrial fibrillation in her mother; Diabetes in her maternal grandmother; Heart attack in her brother and mother;  Heart disease in her brother; Heart failure in her father and paternal grandfather; Stroke in her paternal grandfather.    ROS:  Please see the history of present illness.   Otherwise, review of systems is positive for none.   All other systems are reviewed and negative.   PHYSICAL EXAM: VS:  BP (!)  185/106   Pulse (!) 53   Temp (!) 97.3 F (36.3 C) (Skin)   Resp 16   Ht 5\' 7"  (1.702 m)   Wt 89.8 kg   SpO2 98%   BMI 31.01 kg/m  , BMI Body mass index is 31.01 kg/m. GEN: Well nourished, well developed, in no acute distress  HEENT: normal  Neck: no JVD, carotid bruits, or masses Cardiac: iRRR; no murmurs, rubs, or gallops,no edema  Respiratory:  clear to auscultation bilaterally, normal work of breathing GI: soft, nontender, nondistended, + BS MS: no deformity or atrophy  Skin: warm and dry Neuro:  Strength and sensation are intact Psych: euthymic mood, full affect   Recent Labs: 03/12/2019: BUN 15; Creatinine, Ser 0.97; Hemoglobin 11.6; Platelets 350; Potassium 5.5; Sodium 141    Lipid Panel  No results found for: CHOL, TRIG, HDL, CHOLHDL, VLDL, LDLCALC, LDLDIRECT   Wt Readings from Last 3 Encounters:  03/21/19 89.8 kg  02/10/19 89.9 kg  01/23/19 90.2 kg      Other studies Reviewed: Additional studies/ records that were reviewed today include: Monitor 01/29/2019 personally reviewed Review of the above records today demonstrates:  1. atrial fibrillation with good heart rate control 2.  Rare ventricular ectopy   ASSESSMENT AND PLAN:  1.  Persistent atrial fibrillation: Currently on Eliquis and propafenone.  She unfortunately has continued episodes of atrial fibrillation.  I discussed with her the possibility of further therapy including admission for dofetilide loading versus ablation.  Her left atrium is moderately enlarged.  Risks and benefits of ablation include bleeding, tamponade, heart block, stroke, damage surrounding organs.  She understands the risks of the procedure and is agreed.  This patients CHA2DS2-VASc Score and unadjusted Ischemic Stroke Rate (% per year) is equal to 9.7 % stroke rate/year from a score of 6  Above score calculated as 1 point each if present [CHF, HTN, DM, Vascular=MI/PAD/Aortic Plaque, Age if 65-74, or Female] Above score  calculated as 2 points each if present [Age > 75, or Stroke/TIA/TE]  2.  Hypertension: Blood pressure is elevated today but is usually more normal.  No changes.  April Munoz has presented today for surgery, with the diagnosis of atrial fibrillation.  The various methods of treatment have been discussed with the patient and family. After consideration of risks, benefits and other options for treatment, the patient has consented to  Procedure(s): Catheter ablation as a surgical intervention .  Risks include but not limited to bleeding, tamponade, heart block, stroke, damage to surrounding organs, among others. The patient's history has been reviewed, patient examined, no change in status, stable for surgery.  I have reviewed the patient's chart and labs.  Questions were answered to the patient's satisfaction.    April Kitts Curt Bears, MD 03/21/2019 10:05 AM

## 2019-03-21 NOTE — Progress Notes (Signed)
Site area: rt groin fv sheaths x2 Site Prior to Removal:  Level 0 Pressure Applied For: 20 minutes Manual:   yes Patient Status During Pull:   stable Post Pull Site:  Level  0 Post Pull Instructions Given:  yes Post Pull Pulses Present: rt dp palpable Dressing Applied:  Gauze and tegaderm Bedrest begins @  Comments:   

## 2019-03-21 NOTE — Transfer of Care (Signed)
Immediate Anesthesia Transfer of Care Note  Patient: April Munoz  Procedure(s) Performed: ATRIAL FIBRILLATION ABLATION (N/A )  Patient Location: Cath Lab  Anesthesia Type:General  Level of Consciousness: awake, oriented, patient cooperative and responds to stimulation  Airway & Oxygen Therapy: Patient Spontanous Breathing and Patient connected to face mask oxygen  Post-op Assessment: Report given to RN and Post -op Vital signs reviewed and stable  Post vital signs: Reviewed and stable  Last Vitals:  Vitals Value Taken Time  BP 136/68 03/21/19 1337  Temp 36.5 C 03/21/19 1335  Pulse 46 03/21/19 1337  Resp 20 03/21/19 1337  SpO2 98 % 03/21/19 1337  Vitals shown include unvalidated device data.  Last Pain:  Vitals:   03/21/19 1335  TempSrc: Tympanic  PainSc: 0-No pain         Complications: No apparent anesthesia complications

## 2019-03-25 ENCOUNTER — Encounter (HOSPITAL_COMMUNITY): Payer: Self-pay | Admitting: Cardiology

## 2019-04-16 DIAGNOSIS — M1712 Unilateral primary osteoarthritis, left knee: Secondary | ICD-10-CM | POA: Diagnosis not present

## 2019-04-18 ENCOUNTER — Telehealth: Payer: Self-pay | Admitting: Cardiology

## 2019-04-18 NOTE — Telephone Encounter (Signed)
Patient would like samples if we can spare.April Munoz

## 2019-04-18 NOTE — Telephone Encounter (Signed)
°*  STAT* If patient is at the pharmacy, call can be transferred to refill team.   1. Which medications need to be refilled? (please list name of each medication and dose if known) Eliquis 5mg  takes 2 times daily   2. Which pharmacy/location (including street and city if local pharmacy) is medication to be sent to? [facility]   3. Do they need a 30 day or 90 day supply? 180  Make sure we call into [facility]

## 2019-04-21 ENCOUNTER — Ambulatory Visit (HOSPITAL_COMMUNITY): Payer: Medicare Other | Admitting: Nurse Practitioner

## 2019-04-21 MED ORDER — APIXABAN 5 MG PO TABS: 5.0000 mg | ORAL_TABLET | Freq: Two times a day (BID) | ORAL | 0 refills | Status: DC

## 2019-04-21 NOTE — Telephone Encounter (Signed)
Refill for eliquis sent to [facility] as requested.  Patient advised that samples are available for pick up in the [place] office.   Patient reports that she has not completed patient assistance paperwork as discussed on 01/06/2019. Will place a new copy of the paperwork with her samples for her to complete and return to our office to be faxed for processing with [facility].   Patient is agreeable to plan and verbalized understanding. No further questions.

## 2019-04-21 NOTE — Addendum Note (Signed)
Addended by: Austin Miles on: 04/21/2019 09:28 AM   Modules accepted: Orders

## 2019-04-22 ENCOUNTER — Other Ambulatory Visit: Payer: Self-pay

## 2019-04-22 ENCOUNTER — Encounter (HOSPITAL_COMMUNITY): Payer: Self-pay | Admitting: Nurse Practitioner

## 2019-04-22 ENCOUNTER — Ambulatory Visit (HOSPITAL_COMMUNITY)
Admission: RE | Admit: 2019-04-22 | Discharge: 2019-04-22 | Disposition: A | Discharge status: Home / Self Care | Payer: Medicare Other | Source: Ambulatory Visit | Attending: Nurse Practitioner | Admitting: Nurse Practitioner

## 2019-04-22 VITALS — BP 180/88 | HR 77 | Ht 67.0 in | Wt 202.2 lb

## 2019-04-22 DIAGNOSIS — Z7984 Long term (current) use of oral hypoglycemic drugs: Secondary | ICD-10-CM | POA: Insufficient documentation

## 2019-04-22 DIAGNOSIS — Z79899 Other long term (current) drug therapy: Secondary | ICD-10-CM | POA: Diagnosis not present

## 2019-04-22 DIAGNOSIS — I4819 Other persistent atrial fibrillation: Secondary | ICD-10-CM | POA: Diagnosis not present

## 2019-04-22 DIAGNOSIS — I10 Essential (primary) hypertension: Secondary | ICD-10-CM | POA: Insufficient documentation

## 2019-04-22 DIAGNOSIS — E785 Hyperlipidemia, unspecified: Secondary | ICD-10-CM | POA: Insufficient documentation

## 2019-04-22 DIAGNOSIS — R011 Cardiac murmur, unspecified: Secondary | ICD-10-CM | POA: Diagnosis not present

## 2019-04-22 DIAGNOSIS — M797 Fibromyalgia: Secondary | ICD-10-CM | POA: Diagnosis not present

## 2019-04-22 DIAGNOSIS — F329 Major depressive disorder, single episode, unspecified: Secondary | ICD-10-CM | POA: Insufficient documentation

## 2019-04-22 DIAGNOSIS — E1140 Type 2 diabetes mellitus with diabetic neuropathy, unspecified: Secondary | ICD-10-CM | POA: Insufficient documentation

## 2019-04-22 DIAGNOSIS — Z7901 Long term (current) use of anticoagulants: Secondary | ICD-10-CM | POA: Diagnosis not present

## 2019-04-22 DIAGNOSIS — M1990 Unspecified osteoarthritis, unspecified site: Secondary | ICD-10-CM | POA: Insufficient documentation

## 2019-04-22 DIAGNOSIS — G2581 Restless legs syndrome: Secondary | ICD-10-CM | POA: Insufficient documentation

## 2019-04-22 DIAGNOSIS — M810 Age-related osteoporosis without current pathological fracture: Secondary | ICD-10-CM | POA: Diagnosis not present

## 2019-04-22 DIAGNOSIS — E538 Deficiency of other specified B group vitamins: Secondary | ICD-10-CM | POA: Insufficient documentation

## 2019-04-22 NOTE — Progress Notes (Addendum)
Primary Care Physician: [name], MD Referring Physician: Dr. Curt Bears Cardiologist: Dr. Knox Saliva April is a 68 y.o. Munoz with a h/o DM, prior TIA's, paroxysmal afib previously treated with propafenone, that is in the afib clinic for one month f/u ablation.  She reports that she is doing well without any significant issues with afib. No swallowing or groin issues. She had been in persistent afib she reports for the majority of 3 years and deconditioned during this time. She had gained weight as well but is trying to increase her walking now that she is feeling better.   Today, she denies symptoms of palpitations, chest pain, shortness of breath, orthopnea, PND, lower extremity edema, dizziness, presyncope, syncope, or neurologic sequela. The patient is tolerating medications without difficulties and is otherwise without complaint today.   Past Medical History:  Diagnosis Date  . Anxiety   . Arthritis   . Atrial fibrillation ([place])   . B12 deficiency 10/31/2017  . Cellulitis of abdominal wall 07/07/2016  . Depression   . Diabetes mellitus without complication ([place])   . Esophageal stricture 06/05/2017  . Essential hypertension 02/24/2013  . Fibromyalgia 02/24/2013  . Full dentures   . Hereditary and idiopathic peripheral neuropathy 02/24/2013  . Hyperlipidemia 02/24/2013  . Hypertension   . Murmur, cardiac 08/15/2015  . Neuropathy in diabetes ([place]) 10/31/2017  . Osteoporosis   . Paroxysmal atrial fibrillation ([place]) 08/15/2015   CHADS2vasc=3 CHADS2vasc=3  . Restless legs syndrome 03/14/2013  . Thoracic or lumbosacral neuritis or radiculitis 02/24/2013  . Type 2 diabetes mellitus, without long-term current use of insulin ([place]) 02/24/2013  . Wears glasses    Past Surgical History:  Procedure Laterality Date  . ATRIAL FIBRILLATION ABLATION N/A 03/21/2019   Procedure: ATRIAL FIBRILLATION ABLATION;  Surgeon: Constance Haw, MD;  Location: [facility];  Service:  Cardiovascular;  Laterality: N/A;  . BREAST REDUCTION SURGERY  1983  . CATARACT EXTRACTION, BILATERAL  2018, 2019  . CHOLECYSTECTOMY  1971   open  . DIAGNOSTIC LAPAROSCOPY  2010   lysis of adhesions  . DILATION AND CURETTAGE OF UTERUS  2004  . KNEE ARTHROSCOPY  1998   left  . KNEE ARTHROSCOPY W/ LATERAL RETINACULAR REPAIR    . MASS EXCISION Left 11/11/2013   Procedure: EXCISION MUCOID CYST LEFT INDEX FINGER/DEBRIDEMENT LEFT INDEX FINGER;  Surgeon: Wynonia Sours, MD;  Location: [facility];  Service: Orthopedics;  Laterality: Left;  ANESTHESIA: IV REGIONAL/FAB  . SHOULDER ARTHROSCOPY W/ ROTATOR CUFF REPAIR  2007   left  . TRIGGER FINGER RELEASE Left 11/11/2013   Procedure: RELEASE A-1 PULLEY LEFT RING FINGER;  Surgeon: Wynonia Sours, MD;  Location: [facility];  Service: Orthopedics;  Laterality: Left;  . UMBILICAL HERNIA REPAIR  2008, 2010    Current Outpatient Medications  Medication Sig Dispense Refill  . acyclovir (ZOVIRAX) 800 MG tablet Take 800 mg by mouth 4 (four) times daily as needed (shingles flare).    Marland Kitchen apixaban (ELIQUIS) 5 MG TABS tablet Take 1 tablet (5 mg total) by mouth 2 (two) times daily. 180 tablet 0  . B Complex-C-Folic Acid TABS Take 1 tablet by mouth daily.    . Biotin 1000 MCG tablet Take 1,000 mcg by mouth daily.    . Calcium Carb-Cholecalciferol 600-500 MG-UNIT CAPS Take 1 tablet by mouth 2 (two) times daily.    . Calcium-Vitamin D-Vitamin K (VIACTIV CALCIUM PLUS D) 650-12.5-40 MG-MCG-MCG CHEW Chew 1 capsule by mouth  daily.    . Carboxymethylcellul-Glycerin (LUBRICATING EYE DROPS OP) Place 1 drop into both eyes 4 (four) times daily as needed (dry eyes).    . cyanocobalamin (,VITAMIN B-12,) 1000 MCG/ML injection Inject 1,000 mcg into the muscle every 30 (thirty) days.    . ferrous sulfate 325 (65 FE) MG tablet Take 325 mg by mouth daily.    . fexofenadine (ALLEGRA) 180 MG tablet Take 180 mg by mouth daily as needed for allergies or  rhinitis.    Marland Kitchen ibuprofen (ADVIL) 200 MG tablet Take 400 mg by mouth every 6 (six) hours as needed for headache.    . levothyroxine (SYNTHROID) 50 MCG tablet Take 50 mcg by mouth daily.    . metFORMIN (GLUCOPHAGE) 1000 MG tablet Take 1,000 mg by mouth 2 (two) times daily with a meal.    . metoprolol succinate (TOPROL-XL) 25 MG 24 hr tablet Take 25 mg by mouth 2 (two) times a day.     . Multiple Vitamin (MULTIVITAMIN WITH MINERALS) TABS tablet Take 1 tablet by mouth daily.    Marland Kitchen omeprazole (PRILOSEC) 20 MG capsule Take 20 mg by mouth at bedtime.     . potassium chloride (K-DUR) 10 MEQ tablet Take 10 mEq by mouth daily.    . pravastatin (PRAVACHOL) 20 MG tablet Take 20 mg by mouth 4 (four) times a week.    . Probiotic Product (PROBIOTIC & ACIDOPHILUS EX ST PO) Take 1 tablet by mouth daily.     Marland Kitchen torsemide (DEMADEX) 20 MG tablet Take 20 mg by mouth daily as needed (swelling).     . venlafaxine XR (EFFEXOR-XR) 75 MG 24 hr capsule Take 75 mg by mouth 3 (three) times daily.     . vitamin B-12 (CYANOCOBALAMIN) 1000 MCG tablet Take 1,000 mcg by mouth every 30 (thirty) days.     . Vitamin D, Ergocalciferol, (DRISDOL) 1.25 MG (50000 UT) CAPS capsule Take 50,000 Units by mouth every Monday.     . zolpidem (AMBIEN) 5 MG tablet Take 5 mg by mouth at bedtime.      No current facility-administered medications for this encounter.     Allergies  Allergen Reactions  . Prednisone     In high doses causes her to feel crazy    Social History   Socioeconomic History  . Marital status: Married    Spouse name: Not on file  . Number of children: Not on file  . Years of education: Not on file  . Highest education level: Not on file  Occupational History  . Not on file  Social Needs  . Financial resource strain: Not on file  . Food insecurity    Worry: Not on file    Inability: Not on file  . Transportation needs    Medical: Not on file    Non-medical: Not on file  Tobacco Use  . Smoking status: Never  Smoker  . Smokeless tobacco: Never Used  Substance and Sexual Activity  . Alcohol use: No    Comment: very rare  . Drug use: No  . Sexual activity: Not on file  Lifestyle  . Physical activity    Days per week: Not on file    Minutes per session: Not on file  . Stress: Not on file  Relationships  . Social Herbalist on phone: Not on file    Gets together: Not on file    Attends religious service: Not on file    Active member of club  or organization: Not on file    Attends meetings of clubs or organizations: Not on file    Relationship status: Not on file  . Intimate partner violence    Fear of current or ex partner: Not on file    Emotionally abused: Not on file    Physically abused: Not on file    Forced sexual activity: Not on file  Other Topics Concern  . Not on file  Social History Narrative  . Not on file    Family History  Problem Relation Age of Onset  . Heart failure Father   . Heart disease Brother   . Heart attack Brother   . Heart failure Paternal Grandfather   . Stroke Paternal Grandfather   . Atrial fibrillation Mother   . Heart attack Mother   . Diabetes Maternal Grandmother     ROS- All systems are reviewed and negative except as per the HPI above  Physical Exam: There were no vitals filed for this visit. Wt Readings from Last 3 Encounters:  03/21/19 89.8 kg  02/10/19 89.9 kg  01/23/19 90.2 kg    Labs: Lab Results  Component Value Date   NA 141 03/21/2019   K 4.2 03/21/2019   CL 108 03/21/2019   CO2 22 03/21/2019   GLUCOSE 127 (H) 03/21/2019   BUN 11 03/21/2019   CREATININE 0.92 03/21/2019   CALCIUM 8.8 (L) 03/21/2019   No results found for: INR No results found for: CHOL, HDL, LDLCALC, TRIG   GEN- The patient is well appearing, alert and oriented x 3 today.   Head- normocephalic, atraumatic Eyes-  Sclera clear, conjunctiva pink Ears- hearing intact Oropharynx- clear Neck- supple, no JVP Lymph- no cervical  lymphadenopathy Lungs- Clear to ausculation bilaterally, normal work of breathing Heart- Regular rate and rhythm, no murmurs, rubs or gallops, PMI not laterally displaced GI- soft, NT, ND, + BS Extremities- no clubbing, cyanosis, or edema MS- no significant deformity or atrophy Skin- no rash or lesion Psych- euthymic mood, full affect Neuro- strength and sensation are intact  EKG-SR with sinus arrhythmia at 77 bpm, pr int 164 ms, qrs int 70 ms, qtc 448 ms Epic records reviewed    Assessment and Plan: 1. Persistent  afib  S/p ablation and is doing well  Continue metoprolol 25 mg bid  Encouraged to develop a walking program for weight reduction and get better conditioned  2. CHA2DS2VASc score of at least 6 Continue eliquis 5 mg bid without any interruption for the 3 month healing period  Eliquis 5 mg bid assistance forms given   3. HTN Elevated on presentation Rechecked 150/80 Avoid salt Monitor at home  Weight loss encouraged  F/u with Dr. Bettina Gavia 11/10 and Dr. Curt Bears for the 3 month ablation f/u pre recall   April Munoz, [facility] [address] [place],  [address] [phone]

## 2019-04-22 NOTE — Addendum Note (Signed)
Encounter addended by: Sherran Needs, NP on: 04/22/2019 10:50 AM  Actions taken: Clinical Note Signed

## 2019-04-23 DIAGNOSIS — I4891 Unspecified atrial fibrillation: Secondary | ICD-10-CM | POA: Diagnosis not present

## 2019-04-23 DIAGNOSIS — E119 Type 2 diabetes mellitus without complications: Secondary | ICD-10-CM | POA: Diagnosis not present

## 2019-04-23 DIAGNOSIS — R1031 Right lower quadrant pain: Secondary | ICD-10-CM | POA: Diagnosis not present

## 2019-04-23 DIAGNOSIS — I10 Essential (primary) hypertension: Secondary | ICD-10-CM | POA: Diagnosis not present

## 2019-04-28 ENCOUNTER — Ambulatory Visit: Payer: Medicare Other | Admitting: Cardiology

## 2019-04-29 DIAGNOSIS — M1712 Unilateral primary osteoarthritis, left knee: Secondary | ICD-10-CM | POA: Diagnosis not present

## 2019-04-30 DIAGNOSIS — R102 Pelvic and perineal pain: Secondary | ICD-10-CM | POA: Diagnosis not present

## 2019-04-30 DIAGNOSIS — R1031 Right lower quadrant pain: Secondary | ICD-10-CM | POA: Diagnosis not present

## 2019-05-05 NOTE — Telephone Encounter (Signed)
Please resend Eliquis refill to [facility] order. They state they have not gotten it.

## 2019-05-06 MED ORDER — APIXABAN 5 MG PO TABS: 5.0000 mg | ORAL_TABLET | Freq: Two times a day (BID) | ORAL | 0 refills | Status: DC

## 2019-05-06 NOTE — Addendum Note (Signed)
Addended by: Austin Miles on: 05/06/2019 08:32 AM   Modules accepted: Orders

## 2019-05-06 NOTE — Telephone Encounter (Signed)
Refill resent as requested.

## 2019-05-07 DIAGNOSIS — M1712 Unilateral primary osteoarthritis, left knee: Secondary | ICD-10-CM | POA: Diagnosis not present

## 2019-05-13 DIAGNOSIS — M1712 Unilateral primary osteoarthritis, left knee: Secondary | ICD-10-CM | POA: Diagnosis not present

## 2019-05-14 DIAGNOSIS — Z91030 Bee allergy status: Secondary | ICD-10-CM | POA: Diagnosis not present

## 2019-05-14 DIAGNOSIS — F331 Major depressive disorder, recurrent, moderate: Secondary | ICD-10-CM | POA: Diagnosis not present

## 2019-05-14 DIAGNOSIS — E559 Vitamin D deficiency, unspecified: Secondary | ICD-10-CM | POA: Diagnosis not present

## 2019-05-14 DIAGNOSIS — E782 Mixed hyperlipidemia: Secondary | ICD-10-CM | POA: Diagnosis not present

## 2019-05-14 DIAGNOSIS — E038 Other specified hypothyroidism: Secondary | ICD-10-CM | POA: Diagnosis not present

## 2019-05-14 DIAGNOSIS — I4819 Other persistent atrial fibrillation: Secondary | ICD-10-CM | POA: Diagnosis not present

## 2019-05-14 DIAGNOSIS — I10 Essential (primary) hypertension: Secondary | ICD-10-CM | POA: Diagnosis not present

## 2019-05-14 DIAGNOSIS — E1142 Type 2 diabetes mellitus with diabetic polyneuropathy: Secondary | ICD-10-CM | POA: Diagnosis not present

## 2019-05-16 DIAGNOSIS — N182 Chronic kidney disease, stage 2 (mild): Secondary | ICD-10-CM | POA: Diagnosis not present

## 2019-05-16 DIAGNOSIS — E1121 Type 2 diabetes mellitus with diabetic nephropathy: Secondary | ICD-10-CM | POA: Diagnosis not present

## 2019-05-25 NOTE — Progress Notes (Signed)
Cardiology Office Note:    Date:  05/27/2019   ID:  [name], DOB 21-Sep-1950, MRN [id number]  PCP:  [name], MD  Cardiologist:  Shirlee More, MD    Referring MD: [name], MD    ASSESSMENT:    1. Paroxysmal atrial fibrillation (HCC)   2. Chronic anticoagulation   3. Hypertensive heart disease without heart failure   4. High risk medication use    PLAN:    In order of problems listed above:  1. Improved maintaining sinus rhythm she is complaining of fatigue and I will reduce her beta-blocker dose 50% continue anticoagulation.  She has no evidence of heart failure and she takes a minimum dose of diuretic perhaps 1 time a week and is no longer antiarrhythmic drugs.  Recent labs performed through her PCP office are reassuring cholesterol 227 LDL 91 HDL 7410 [id number] with normal renal function.   Next appointment: 3 months   Medication Adjustments/Labs and Tests Ordered: Current medicines are reviewed at length with the patient today.  Concerns regarding medicines are outlined above.  No orders of the defined types were placed in this encounter.  Meds ordered this encounter  Medications  . metoprolol succinate (TOPROL-XL) 25 MG 24 hr tablet    Sig: Take 1 tablet (25 mg total) by mouth daily.    No chief complaint on file.   History of Present Illness:    April Munoz is a 68 y.o. female with a hx of a hx of PAF with CHADS2 vasc of 4 on propafenone,  hypertension  and T2 DM last seen 11/29/16.She carried a 30 day monitor and noted several episodes of atrial flutter and atrial fibrillation . Echocardiogram showed preserved left ventricular ejection fraction with moderate left atrial enlargement and no valvular heart disease.   She was last seen 04/22/2019 following EP catheter ablation performed 04/10/2019.  Right canal.  EKG personally reviewed from the visit shows sinus rhythm and APCs maintaining sinus rhythm after catheter ablation and she is no longer on an  antiarrhythmic drug.. Compliance with diet, lifestyle and medications: Yes  She is improving after restoring sinus rhythm but still feels fatigue and some exercise intolerance no edema shortness of breath orthopnea chest pain or syncope.  She tolerates her anticoagulant without bleeding complication.  I encouraged her to buy the iPhone adapter and screen her heart rhythms at home for recurrent atrial fibrillation.  With her fatigue and exercise intolerance we will decrease the dose of her beta-blocker to 50% Past Medical History:  Diagnosis Date  . Anxiety   . Arthritis   . Atrial fibrillation ([place])   . B12 deficiency 10/31/2017  . Cellulitis of abdominal wall 07/07/2016  . Depression   . Diabetes mellitus without complication ([place])   . Esophageal stricture 06/05/2017  . Essential hypertension 02/24/2013  . Fibromyalgia 02/24/2013  . Full dentures   . Hereditary and idiopathic peripheral neuropathy 02/24/2013  . Hyperlipidemia 02/24/2013  . Hypertension   . Murmur, cardiac 08/15/2015  . Neuropathy in diabetes ([place]) 10/31/2017  . Osteoporosis   . Paroxysmal atrial fibrillation ([place]) 08/15/2015   CHADS2vasc=3 CHADS2vasc=3  . Restless legs syndrome 03/14/2013  . Thoracic or lumbosacral neuritis or radiculitis 02/24/2013  . Type 2 diabetes mellitus, without long-term current use of insulin ([place]) 02/24/2013  . Wears glasses     Past Surgical History:  Procedure Laterality Date  . ATRIAL FIBRILLATION ABLATION N/A 03/21/2019   Procedure: ATRIAL FIBRILLATION ABLATION;  Surgeon: Constance Haw, MD;  Location:  [place] [facility];  Service: Cardiovascular;  Laterality: N/A;  . BREAST REDUCTION SURGERY  1983  . CATARACT EXTRACTION, BILATERAL  2018, 2019  . CHOLECYSTECTOMY  1971   open  . DIAGNOSTIC LAPAROSCOPY  2010   lysis of adhesions  . DILATION AND CURETTAGE OF UTERUS  2004  . KNEE ARTHROSCOPY  1998   left  . KNEE ARTHROSCOPY W/ LATERAL RETINACULAR REPAIR    . MASS EXCISION Left  11/11/2013   Procedure: EXCISION MUCOID CYST LEFT INDEX FINGER/DEBRIDEMENT LEFT INDEX FINGER;  Surgeon: Wynonia Sours, MD;  Location: [place];  Service: Orthopedics;  Laterality: Left;  ANESTHESIA: IV REGIONAL/FAB  . SHOULDER ARTHROSCOPY W/ ROTATOR CUFF REPAIR  2007   left  . TRIGGER FINGER RELEASE Left 11/11/2013   Procedure: RELEASE A-1 PULLEY LEFT RING FINGER;  Surgeon: Wynonia Sours, MD;  Location: [place];  Service: Orthopedics;  Laterality: Left;  . UMBILICAL HERNIA REPAIR  2008, 2010    Current Medications: Current Meds  Medication Sig  . acyclovir (ZOVIRAX) 800 MG tablet Take 800 mg by mouth as needed (shingles flare).   Marland Kitchen apixaban (ELIQUIS) 5 MG TABS tablet Take 1 tablet (5 mg total) by mouth 2 (two) times daily.  . B Complex-C-Folic Acid TABS Take 1 tablet by mouth daily.  . Biotin 1000 MCG tablet Take 1,000 mcg by mouth daily.  . Calcium-Vitamin D-Vitamin K (VIACTIV CALCIUM PLUS D) 650-12.5-40 MG-MCG-MCG CHEW Chew 1 capsule by mouth daily.  . Carboxymethylcellul-Glycerin (LUBRICATING EYE DROPS OP) Place 1 drop into both eyes 4 (four) times daily as needed (dry eyes).  . cyanocobalamin (,VITAMIN B-12,) 1000 MCG/ML injection Inject 1,000 mcg into the muscle every 30 (thirty) days.  . ferrous sulfate 325 (65 FE) MG tablet Take 325 mg by mouth 3 (three) times a week.   Marland Kitchen ibuprofen (ADVIL) 200 MG tablet Take 400 mg by mouth as needed for headache.   . levothyroxine (SYNTHROID) 50 MCG tablet Take 50 mcg by mouth daily.  . metFORMIN (GLUCOPHAGE) 1000 MG tablet Take 1,000 mg by mouth 2 (two) times daily with a meal.  . metoprolol succinate (TOPROL-XL) 25 MG 24 hr tablet Take 1 tablet (25 mg total) by mouth daily.  . Multiple Vitamin (MULTIVITAMIN WITH MINERALS) TABS tablet Take 1 tablet by mouth daily.  Marland Kitchen omeprazole (PRILOSEC) 20 MG capsule Take 20 mg by mouth at bedtime.   . pravastatin (PRAVACHOL) 20 MG tablet Take 20 mg by mouth 2 (two) times a week.    . Probiotic Product (PROBIOTIC & ACIDOPHILUS EX ST PO) Take 1 tablet by mouth daily.   Marland Kitchen torsemide (DEMADEX) 20 MG tablet Take 20 mg by mouth daily as needed (swelling).   . venlafaxine XR (EFFEXOR-XR) 75 MG 24 hr capsule Take 75 mg by mouth daily.   . Vitamin D, Ergocalciferol, (DRISDOL) 1.25 MG (50000 UT) CAPS capsule Take 50,000 Units by mouth every Monday.   . zolpidem (AMBIEN) 5 MG tablet Take 5 mg by mouth as needed for sleep.   . [DISCONTINUED] metoprolol succinate (TOPROL-XL) 25 MG 24 hr tablet Take 25 mg by mouth 2 (two) times a day.      Allergies:   Prednisone   Social History   Socioeconomic History  . Marital status: Married    Spouse name: Not on file  . Number of children: Not on file  . Years of education: Not on file  . Highest education level: Not on file  Occupational History  .  Not on file  Social Needs  . Financial resource strain: Not on file  . Food insecurity    Worry: Not on file    Inability: Not on file  . Transportation needs    Medical: Not on file    Non-medical: Not on file  Tobacco Use  . Smoking status: Never Smoker  . Smokeless tobacco: Never Used  Substance and Sexual Activity  . Alcohol use: No    Comment: very rare  . Drug use: No  . Sexual activity: Not on file  Lifestyle  . Physical activity    Days per week: Not on file    Minutes per session: Not on file  . Stress: Not on file  Relationships  . Social Herbalist on phone: Not on file    Gets together: Not on file    Attends religious service: Not on file    Active member of club or organization: Not on file    Attends meetings of clubs or organizations: Not on file    Relationship status: Not on file  Other Topics Concern  . Not on file  Social History Narrative  . Not on file     Family History: The patient's family history includes Atrial fibrillation in her mother; Diabetes in her maternal grandmother; Heart attack in her brother and mother; Heart disease  in her brother; Heart failure in her father and paternal grandfather; Stroke in her paternal grandfather. ROS:   Please see the history of present illness.    All other systems reviewed and are negative.  EKGs/Labs/Other Studies Reviewed:    The following studies were reviewed today:   Recent Labs: 03/12/2019: Hemoglobin 11.6; Platelets 350 03/21/2019: BUN 11; Creatinine, Ser 0.92; Potassium 4.2; Sodium 141  Recent Lipid Panel No results found for: CHOL, TRIG, HDL, CHOLHDL, VLDL, LDLCALC, LDLDIRECT  Physical Exam:    VS:  BP 138/80 (BP Location: Left Arm, Patient Position: Sitting, Cuff Size: Normal)   Pulse 65   Ht 5\' 7"  (1.702 m)   Wt 195 lb 3.2 oz (88.5 kg)   SpO2 96%   BMI 30.57 kg/m     Wt Readings from Last 3 Encounters:  05/27/19 195 lb 3.2 oz (88.5 kg)  04/22/19 202 lb 3.2 oz (91.7 kg)  03/21/19 198 lb (89.8 kg)     GEN:  Well nourished, well developed in no acute distress HEENT: Normal NECK: No JVD; No carotid bruits LYMPHATICS: No lymphadenopathy CARDIAC: RRR, no murmurs, rubs, gallops RESPIRATORY:  Clear to auscultation without rales, wheezing or rhonchi  ABDOMEN: Soft, non-tender, non-distended MUSCULOSKELETAL:  No edema; No deformity  SKIN: Warm and dry NEUROLOGIC:  Alert and oriented x 3 PSYCHIATRIC:  Normal affect    Signed, Shirlee More, MD  05/27/2019 3:36 PM    [facility]

## 2019-05-27 ENCOUNTER — Other Ambulatory Visit: Payer: Self-pay

## 2019-05-27 ENCOUNTER — Ambulatory Visit (INDEPENDENT_AMBULATORY_CARE_PROVIDER_SITE_OTHER): Payer: Medicare Other | Admitting: Cardiology

## 2019-05-27 ENCOUNTER — Encounter: Payer: Self-pay | Admitting: Cardiology

## 2019-05-27 ENCOUNTER — Encounter

## 2019-05-27 VITALS — BP 138/80 | HR 65 | Ht 67.0 in | Wt 195.2 lb

## 2019-05-27 DIAGNOSIS — I480 Paroxysmal atrial fibrillation: Secondary | ICD-10-CM | POA: Diagnosis not present

## 2019-05-27 DIAGNOSIS — Z7901 Long term (current) use of anticoagulants: Secondary | ICD-10-CM

## 2019-05-27 DIAGNOSIS — I119 Hypertensive heart disease without heart failure: Secondary | ICD-10-CM | POA: Diagnosis not present

## 2019-05-27 DIAGNOSIS — Z79899 Other long term (current) drug therapy: Secondary | ICD-10-CM | POA: Diagnosis not present

## 2019-05-27 MED ORDER — METOPROLOL SUCCINATE ER 25 MG PO TB24: 25.0000 mg | ORAL_TABLET | Freq: Every day | ORAL | Status: DC

## 2019-05-27 NOTE — Patient Instructions (Addendum)
Medication Instructions:  Your physician has recommended you make the following change in your medication:   DECREASE metoprolol succinate (toprol XL) 25 mg: Take 1 tablet daily   *If you need a refill on your cardiac medications before your next appointment, please call your pharmacy*  Lab Work: None  If you have labs (blood work) drawn today and your tests are completely normal, you will receive your results only by: Marland Kitchen MyChart Message (if you have MyChart) OR . A paper copy in the mail If you have any lab test that is abnormal or we need to change your treatment, we will call you to review the results.  Testing/Procedures: None  Follow-Up: At [facility], you and your health needs are our priority.  As part of our continuing mission to provide you with exceptional heart care, we have created designated Provider Care Teams.  These Care Teams include your primary Cardiologist (physician) and Advanced Practice Providers (APPs -  Physician Assistants and Nurse Practitioners) who all work together to provide you with the care you need, when you need it.  Your next appointment:   3 months  The format for your next appointment:   In Person  Provider:   Shirlee More, MD    [facility] [email or web address]        FDA-cleared, clinical grade mobile EKG monitor: Jodelle Red is the most clinically-validated mobile EKG used by the world's leading cardiac care medical professionals With Basic service, know instantly if your heart rhythm is normal or if atrial fibrillation is detected, and email the last single EKG recording to yourself or your doctor Premium service, available for purchase through the Kardia app for $9.99 per month or $99 per year, includes unlimited history and storage of your EKG recordings, a monthly EKG summary report to share with your doctor, along with the ability to track your blood pressure, activity and weight Includes one KardiaMobile  phone clip FREE SHIPPING: Standard delivery 1-3 business days. Orders placed by 11:00am PST will ship that afternoon. Otherwise, will ship next business day. All orders ship via [employer] from [place], [place]

## 2019-06-03 ENCOUNTER — Other Ambulatory Visit: Payer: Self-pay | Admitting: *Deleted

## 2019-06-03 MED ORDER — APIXABAN 5 MG PO TABS: 5.0000 mg | ORAL_TABLET | Freq: Two times a day (BID) | ORAL | 3 refills | Status: DC

## 2019-06-09 ENCOUNTER — Encounter: Payer: Self-pay | Admitting: Cardiology

## 2019-06-09 ENCOUNTER — Ambulatory Visit (INDEPENDENT_AMBULATORY_CARE_PROVIDER_SITE_OTHER): Payer: Medicare Other | Admitting: Cardiology

## 2019-06-09 ENCOUNTER — Other Ambulatory Visit: Payer: Self-pay

## 2019-06-09 VITALS — BP 130/76 | HR 77 | Ht 67.0 in | Wt 194.6 lb

## 2019-06-09 DIAGNOSIS — I483 Typical atrial flutter: Secondary | ICD-10-CM | POA: Diagnosis not present

## 2019-06-09 DIAGNOSIS — Z01812 Encounter for preprocedural laboratory examination: Secondary | ICD-10-CM | POA: Diagnosis not present

## 2019-06-09 NOTE — Progress Notes (Signed)
Electrophysiology Office Note   Date:  06/09/2019   ID:  Lively Behar, DOB March 09, 1951, MRN [id number]  PCP:  [name], MD  Cardiologist: Bettina Gavia Primary Electrophysiologist:  Constance Haw, MD    No chief complaint on file.    History of Present Illness: April Munoz is a 68 y.o. female who is being seen today for the evaluation of atrial fibrillation at the request of Cox, Kirsten, MD. Presenting today for electrophysiology evaluation.  She has a history significant for persistent atrial fibrillation, depression, diabetes, hypertension, hyperlipidemia, TIA.  She is currently on propafenone.  She wore a 30-day monitor that showed almost entirely atrial fibrillation.  She does have symptoms of malaise, fatigue, and exercise intolerance.  She has been compliant with anticoagulation.  Per report, she has preserved LV systolic function with moderate left atrial enlargement no valvular heart disease.  She is status post AF ablation 03/21/2019.  Today, denies symptoms of palpitations, chest pain, orthopnea, PND, lower extremity edema, claudication, dizziness, presyncope, syncope, bleeding, or neurologic sequela. The patient is tolerating medications without difficulties.  Approximately 1 month ago, she developed weakness and fatigue.  She has also been having some shortness of breath with exertion.  She feels well at rest.  Her ECG shows typical appearing atrial flutter.  She had not been diagnosed with this prior to her AF ablation.   Past Medical History:  Diagnosis Date  . Anxiety   . Arthritis   . Atrial fibrillation ([place])   . B12 deficiency 10/31/2017  . Cellulitis of abdominal wall 07/07/2016  . Depression   . Diabetes mellitus without complication ([place])   . Esophageal stricture 06/05/2017  . Essential hypertension 02/24/2013  . Fibromyalgia 02/24/2013  . Full dentures   . Hereditary and idiopathic peripheral neuropathy 02/24/2013  . Hyperlipidemia 02/24/2013  . Hypertension    . Murmur, cardiac 08/15/2015  . Neuropathy in diabetes ([place]) 10/31/2017  . Osteoporosis   . Paroxysmal atrial fibrillation ([place]) 08/15/2015   CHADS2vasc=3 CHADS2vasc=3  . Restless legs syndrome 03/14/2013  . Thoracic or lumbosacral neuritis or radiculitis 02/24/2013  . Type 2 diabetes mellitus, without long-term current use of insulin ([place]) 02/24/2013  . Wears glasses    Past Surgical History:  Procedure Laterality Date  . ATRIAL FIBRILLATION ABLATION N/A 03/21/2019   Procedure: ATRIAL FIBRILLATION ABLATION;  Surgeon: Constance Haw, MD;  Location: [place] [facility];  Service: Cardiovascular;  Laterality: N/A;  . BREAST REDUCTION SURGERY  1983  . CATARACT EXTRACTION, BILATERAL  2018, 2019  . CHOLECYSTECTOMY  1971   open  . DIAGNOSTIC LAPAROSCOPY  2010   lysis of adhesions  . DILATION AND CURETTAGE OF UTERUS  2004  . KNEE ARTHROSCOPY  1998   left  . KNEE ARTHROSCOPY W/ LATERAL RETINACULAR REPAIR    . MASS EXCISION Left 11/11/2013   Procedure: EXCISION MUCOID CYST LEFT INDEX FINGER/DEBRIDEMENT LEFT INDEX FINGER;  Surgeon: Wynonia Sours, MD;  Location: [place];  Service: Orthopedics;  Laterality: Left;  ANESTHESIA: IV REGIONAL/FAB  . SHOULDER ARTHROSCOPY W/ ROTATOR CUFF REPAIR  2007   left  . TRIGGER FINGER RELEASE Left 11/11/2013   Procedure: RELEASE A-1 PULLEY LEFT RING FINGER;  Surgeon: Wynonia Sours, MD;  Location: [place];  Service: Orthopedics;  Laterality: Left;  . UMBILICAL HERNIA REPAIR  2008, 2010     Current Outpatient Medications  Medication Sig Dispense Refill  . acyclovir (ZOVIRAX) 800 MG tablet Take 800 mg by mouth as needed (  shingles flare).     Marland Kitchen apixaban (ELIQUIS) 5 MG TABS tablet Take 1 tablet (5 mg total) by mouth 2 (two) times daily. 180 tablet 3  . B Complex-C-Folic Acid TABS Take 1 tablet by mouth daily.    . Biotin 1000 MCG tablet Take 1,000 mcg by mouth daily.    . Calcium-Vitamin D-Vitamin K (VIACTIV CALCIUM PLUS D)  650-12.5-40 MG-MCG-MCG CHEW Chew 1 capsule by mouth daily.    . cyanocobalamin (,VITAMIN B-12,) 1000 MCG/ML injection Inject 1,000 mcg into the muscle every 30 (thirty) days.    . ferrous sulfate 325 (65 FE) MG tablet Take 325 mg by mouth 3 (three) times a week.     Marland Kitchen ibuprofen (ADVIL) 200 MG tablet Take 400 mg by mouth as needed for headache.     . levothyroxine (SYNTHROID) 50 MCG tablet Take 50 mcg by mouth daily.    . metFORMIN (GLUCOPHAGE) 1000 MG tablet Take 1,000 mg by mouth 2 (two) times daily with a meal.    . metoprolol succinate (TOPROL-XL) 25 MG 24 hr tablet Take 1 tablet (25 mg total) by mouth daily.    . Multiple Vitamin (MULTIVITAMIN WITH MINERALS) TABS tablet Take 1 tablet by mouth daily.    Marland Kitchen omeprazole (PRILOSEC) 20 MG capsule Take 20 mg by mouth at bedtime.     . pravastatin (PRAVACHOL) 20 MG tablet Take 20 mg by mouth 4 (four) times a week.    . Probiotic Product (PROBIOTIC & ACIDOPHILUS EX ST PO) Take 1 tablet by mouth daily.     Marland Kitchen torsemide (DEMADEX) 20 MG tablet Take 20 mg by mouth daily as needed (swelling).     . venlafaxine XR (EFFEXOR-XR) 75 MG 24 hr capsule Take 75 mg by mouth daily.     . Vitamin D, Ergocalciferol, (DRISDOL) 1.25 MG (50000 UT) CAPS capsule Take 50,000 Units by mouth every Monday.     . zolpidem (AMBIEN) 5 MG tablet Take 5 mg by mouth as needed for sleep.      No current facility-administered medications for this visit.     Allergies:   Prednisone   Social History:  The patient  reports that she has never smoked. She has never used smokeless tobacco. She reports that she does not drink alcohol or use drugs.   Family History:  The patient's family history includes Atrial fibrillation in her mother; Diabetes in her maternal grandmother; Heart attack in her brother and mother; Heart disease in her brother; Heart failure in her father and paternal grandfather; Stroke in her paternal grandfather.    ROS:  Please see the history of present illness.    Otherwise, review of systems is positive for none.   All other systems are reviewed and negative.   PHYSICAL EXAM: VS:  BP 130/76   Pulse 77   Ht 5\' 7"  (1.702 m)   Wt 194 lb 9.6 oz (88.3 kg)   SpO2 98%   BMI 30.48 kg/m  , BMI Body mass index is 30.48 kg/m. GEN: Well nourished, well developed, in no acute distress  HEENT: normal  Neck: no JVD, carotid bruits, or masses Cardiac: RRR; no murmurs, rubs, or gallops,no edema  Respiratory:  clear to auscultation bilaterally, normal work of breathing GI: soft, nontender, nondistended, + BS MS: no deformity or atrophy  Skin: warm and dry Neuro:  Strength and sensation are intact Psych: euthymic mood, full affect  EKG:  EKG is ordered today. Personal review of the ekg ordered shows atrial flutter,  rate 77  Recent Labs: 03/12/2019: Hemoglobin 11.6; Platelets 350 03/21/2019: BUN 11; Creatinine, Ser 0.92; Potassium 4.2; Sodium 141    Lipid Panel  No results found for: CHOL, TRIG, HDL, CHOLHDL, VLDL, LDLCALC, LDLDIRECT   Wt Readings from Last 3 Encounters:  06/09/19 194 lb 9.6 oz (88.3 kg)  05/27/19 195 lb 3.2 oz (88.5 kg)  04/22/19 202 lb 3.2 oz (91.7 kg)      Other studies Reviewed: Additional studies/ records that were reviewed today include: Monitor 01/29/2019 personally reviewed Review of the above records today demonstrates:  1. atrial fibrillation with good heart rate control 2.  Rare ventricular ectopy   ASSESSMENT AND PLAN:  1.  Persistent atrial fibrillation: Only on Eliquis and propafenone.  Status post AF ablation 03/21/2019.  No obvious recurrence.  No changes.  This patients CHA2DS2-VASc Score and unadjusted Ischemic Stroke Rate (% per year) is equal to 9.7 % stroke rate/year from a score of 6  Above score calculated as 1 point each if present [CHF, HTN, DM, Vascular=MI/PAD/Aortic Plaque, Age if 65-74, or Female] Above score calculated as 2 points each if present [Age > 75, or Stroke/TIA/TE]   2.  Hypertension:  Currently well controlled  3.  Typical atrial flutter.  Has occurred post AF ablation.  We Durga Saldarriaga thus plan for ablation of atrial flutter.  Risks and benefits were discussed include bleeding, tamponade, heart block, stroke, among others.  She understands these risks and is agreed to the procedure.  Current medicines are reviewed at length with the patient today.   The patient does not have concerns regarding her medicines.  The following changes were made today:  none  Labs/ tests ordered today include:  Orders Placed This Encounter  Procedures  . EKG 12-Lead    Disposition:   FU with Varnika Butz 3 months  Signed, Nick Stults Meredith Leeds, MD  06/09/2019 4:11 PM     [place] [address] [phone] (office) [phone] (fax)

## 2019-06-09 NOTE — Patient Instructions (Signed)
Medication Instructions:  Your physician recommends that you continue on your current medications as directed. Please refer to the Current Medication list given to you today.  Labwork: Return for pre procedure lab work between 07/07/19 - 07/25/2019 :  BMET & CBC If you have labs (blood work) drawn today and your tests are completely normal, you will receive your results only by:  [facility] (if you have MyChart) OR  A paper copy in the mail If you have any lab test that is abnormal or we need to change your treatment, we will call you to review the results.  Testing/Procedures: Your physician has recommended that you have an ablation. Catheter ablation is a medical procedure used to treat some cardiac arrhythmias (irregular heartbeats). During catheter ablation, a long, thin, flexible tube is put into a blood vessel in your groin (upper thigh), or neck. This tube is called an ablation catheter. It is then guided to your heart through the blood vessel. Radio frequency waves destroy small areas of heart tissue where abnormal heartbeats may cause an arrhythmia to start. Please see instructions below.   Follow-Up: Your physician recommends that you schedule a follow-up appointment in: 4 weeks, after your procedure on 07/30/2019, with Dr. Curt Bears.  * If you need a refill on your cardiac medications before your next appointment, please call your pharmacy.   *Please note that any paperwork needing to be filled out by the provider will need to be addressed at the front desk prior to seeing the provider. Please note that any FMLA, disability or other documents regarding health condition is subject to a $25.00 charge that must be received prior to completion of paperwork in the form of a money order or check.  Thank you for choosing [facility]!!   Trinidad Curet, RN [phone]  Any Other Special Instructions Will Be Listed Below (If Applicable).   Electrophysiology/Ablation Procedure  Instructions   You are scheduled for a(n)  ablation on 07/30/2019 with Dr. Allegra Lai.   1.   Pre procedure testing-             A.  LAB WORK --- On __________  for your pre procedure blood work.                 B. COVID TEST-- On 07/26/2019 @  12:00 pm -  You will go to [facility] hospital ([place]) for your Covid testing.   This is a drive thru test site.  There will be multiple testing areas.  Be sure to share with the first checkpoint that you are there for pre-procedure/surgery testing. This will put you into the right (yellow) lane that leads to the PAT testing team. Stay in your car and the nurse team will come to your car to test you.  After you are tested please go home and self quarantine until the day of your procedure.     2. On the day of your procedure 07/25/2019 you will go to [facility] [phone]) at 9:30 am.  Dennis Bast will go to the main entrance A [employer]) and enter where the [employer] are.  Your driver will drop you off and you will head down the hallway to ADMITTING.  You may have one support person come in to the hospital with you.  They will be asked to wait in the waiting room.   3.   Do not eat or drink after midnight prior to your  procedure.   4.   Do NOT take any medications the morning of your procedure.   5.  Plan for an overnight stay.  If you use your phone frequently bring your phone charger.   6. You will follow up with Dr. Curt Bears 4 weeks after procedure.  This appointment will be made for you.    * If you have ANY questions please call the office [phone] and ask for Caelin Rosen RN or send me a MyChart message   * Occasionally, EP Studies and ablations can become lengthy.  Please make your family aware of this before your procedure starts.  Average time ranges from 2-8 hours for EP studies/ablations.  Your physician will call your family after the procedure with the results.                                  a   Cardiac Ablation Cardiac ablation is a procedure to disable (ablate) a small amount of heart tissue in very specific places. The heart has many electrical connections. Sometimes these connections are abnormal and can cause the heart to beat very fast or irregularly. Ablating some of the problem areas can improve the heart rhythm or return it to normal. Ablation may be done for people who:  Have Wolff-Parkinson-White syndrome.  Have fast heart rhythms (tachycardia).  Have taken medicines for an abnormal heart rhythm (arrhythmia) that were not effective or caused side effects.  Have a high-risk heartbeat that may be life-threatening.  During the procedure, a small incision is made in the neck or the groin, and a long, thin, flexible tube (catheter) is inserted into the incision and moved to the heart. Small devices (electrodes) on the tip of the catheter will send out electrical currents. A type of X-ray (fluoroscopy) will be used to help guide the catheter and to provide images of the heart. Tell a health care provider about:  Any allergies you have.  All medicines you are taking, including vitamins, herbs, eye drops, creams, and over-the-counter medicines.  Any problems you or family members have had with anesthetic medicines.  Any blood disorders you have.  Any surgeries you have had.  Any medical conditions you have, such as kidney failure.  Whether you are pregnant or may be pregnant. What are the risks? Generally, this is a safe procedure. However, problems may occur, including:  Infection.  Bruising and bleeding at the catheter insertion site.  Bleeding into the chest, especially into the sac that surrounds the heart. This is a serious complication.  Stroke or blood clots.  Damage to other structures or organs.  Allergic reaction to medicines or dyes.  Need for a permanent pacemaker if the normal electrical system is damaged. A pacemaker is a small computer that sends  electrical signals to the heart and helps your heart beat normally.  The procedure not being fully effective. This may not be recognized until months later. Repeat ablation procedures are sometimes required.  What happens before the procedure?  Follow instructions from your health care provider about eating or drinking restrictions.  Ask your health care provider about: ? Changing or stopping your regular medicines. This is especially important if you are taking diabetes medicines or blood thinners. ? Taking medicines such as aspirin and ibuprofen. These medicines can thin your blood. Do not take these medicines before your procedure if your health care provider instructs you not to.  Plan to  have someone take you home from the hospital or clinic.  If you will be going home right after the procedure, plan to have someone with you for 24 hours. What happens during the procedure?  To lower your risk of infection: ? Your health care team will wash or sanitize their hands. ? Your skin will be washed with soap. ? Hair may be removed from the incision area.  An IV tube will be inserted into one of your veins.  You will be given a medicine to help you relax (sedative).  The skin on your neck or groin will be numbed.  An incision will be made in your neck or your groin.  A needle will be inserted through the incision and into a large vein in your neck or groin.  A catheter will be inserted into the needle and moved to your heart.  Dye may be injected through the catheter to help your surgeon see the area of the heart that needs treatment.  Electrical currents will be sent from the catheter to ablate heart tissue in desired areas. There are three types of energy that may be used to ablate heart tissue: ? Heat (radiofrequency energy). ? Laser energy. ? Extreme cold (cryoablation).  When the necessary tissue has been ablated, the catheter will be removed.  Pressure will be held on the  catheter insertion area to prevent excessive bleeding.  A bandage (dressing) will be placed over the catheter insertion area. The procedure may vary among health care providers and hospitals. What happens after the procedure?  Your blood pressure, heart rate, breathing rate, and blood oxygen level will be monitored until the medicines you were given have worn off.  Your catheter insertion area will be monitored for bleeding. You will need to lie still for a few hours to ensure that you do not bleed from the catheter insertion area.  Do not drive for 5-7 days or as long as directed by your health care provider. Summary  Cardiac ablation is a procedure to disable (ablate) a small amount of heart tissue in very specific places. Ablating some of the problem areas can improve the heart rhythm or return it to normal.  During the procedure, electrical currents will be sent from the catheter to ablate heart tissue in desired areas. This information is not intended to replace advice given to you by your health care provider. Make sure you discuss any questions you have with your health care provider. Document Released: 11/19/2008 Document Revised: 05/22/2016 Document Reviewed: 05/22/2016 Elsevier Interactive Patient Education  [employer].

## 2019-06-18 DIAGNOSIS — H04123 Dry eye syndrome of bilateral lacrimal glands: Secondary | ICD-10-CM | POA: Diagnosis not present

## 2019-06-24 ENCOUNTER — Telehealth: Payer: Self-pay | Admitting: Cardiology

## 2019-06-24 NOTE — Telephone Encounter (Signed)
Pt told me this is too late to go over this stuff.  Her procedure is not till next month.  Informed that I would call her back by next week to review. Pt is agreeable to this.

## 2019-06-24 NOTE — Telephone Encounter (Signed)
New Message:  Patient would like Sherri to call her to clarify all of the dates of tests, labs, and when to stop medications prior to her upcoming ablation. There are a lot of dates and the patient just needs to make sure she does everything in the right order.

## 2019-07-01 DIAGNOSIS — E119 Type 2 diabetes mellitus without complications: Secondary | ICD-10-CM | POA: Diagnosis not present

## 2019-07-01 DIAGNOSIS — I4891 Unspecified atrial fibrillation: Secondary | ICD-10-CM | POA: Diagnosis not present

## 2019-07-01 DIAGNOSIS — I10 Essential (primary) hypertension: Secondary | ICD-10-CM | POA: Diagnosis not present

## 2019-07-01 DIAGNOSIS — R1031 Right lower quadrant pain: Secondary | ICD-10-CM | POA: Diagnosis not present

## 2019-07-17 NOTE — Telephone Encounter (Signed)
Reviewed procedure instructions w/ pt. She will stop by the [place] office next week for lab work. Covid screening scheduled for 07/26/19. Procedure instructions reviewed. Post ablation f/u scheduled for 09/08/19 in [place]. Patient verbalized understanding and agreeable to plan.

## 2019-07-23 LAB — BASIC METABOLIC PANEL
BUN/Creatinine Ratio: 19 (ref 12–28)
BUN: 17 mg/dL (ref 8–27)
CO2: 26 mmol/L (ref 20–29)
Calcium: 9.3 mg/dL (ref 8.7–10.3)
Chloride: 100 mmol/L (ref 96–106)
Creatinine, Ser: 0.9 mg/dL (ref 0.57–1.00)
GFR calc Af Amer: 75 mL/min/{1.73_m2} (ref >59)
GFR calc non Af Amer: 65 mL/min/{1.73_m2} (ref >59)
Glucose: 137 mg/dL — ABNORMAL HIGH (ref 65–99)
Potassium: 4.2 mmol/L (ref 3.5–5.2)
Sodium: 141 mmol/L (ref 134–144)

## 2019-07-23 LAB — CBC
Hematocrit: 40.2 % (ref 34.0–46.6)
Hemoglobin: 13.2 g/dL (ref 11.1–15.9)
MCH: 29.4 pg (ref 26.6–33.0)
MCHC: 32.8 g/dL (ref 31.5–35.7)
MCV: 90 fL (ref 79–97)
Platelets: 390 10*3/uL (ref 150–450)
RBC: 4.49 x10E6/uL (ref 3.77–5.28)
RDW: 13.2 % (ref 11.7–15.4)
WBC: 7.6 10*3/uL (ref 3.4–10.8)

## 2019-07-26 ENCOUNTER — Other Ambulatory Visit (HOSPITAL_COMMUNITY)
Admission: RE | Admit: 2019-07-26 | Discharge: 2019-07-26 | Disposition: A | Discharge status: Home / Self Care | Payer: Medicare Other | Source: Ambulatory Visit | Attending: Cardiology | Admitting: Cardiology

## 2019-07-26 DIAGNOSIS — Z20822 Contact with and (suspected) exposure to covid-19: Secondary | ICD-10-CM | POA: Diagnosis not present

## 2019-07-26 DIAGNOSIS — Z01812 Encounter for preprocedural laboratory examination: Secondary | ICD-10-CM | POA: Insufficient documentation

## 2019-07-27 LAB — NOVEL CORONAVIRUS, NAA (HOSP ORDER, SEND-OUT TO REF LAB; TAT 18-24 HRS): SARS-CoV-2, NAA: NOT DETECTED

## 2019-07-28 ENCOUNTER — Telehealth: Payer: Self-pay | Admitting: Cardiology

## 2019-07-28 NOTE — Telephone Encounter (Signed)
New Message:    Please call, questions about her instructions for her Ablation on Wednesday.

## 2019-07-28 NOTE — Telephone Encounter (Signed)
Returned pt call. Answered all questions. She understands not to stop Eliquis for procedure, only to hold it the morning of ablation. Metformin instructions also given to pt.. Patient verbalized understanding and agreeable to plan.

## 2019-07-29 ENCOUNTER — Telehealth: Payer: Self-pay

## 2019-07-29 NOTE — Telephone Encounter (Signed)
-----   Message from Will Meredith Leeds, MD sent at 07/29/2019 10:02 AM EST ----- Stable preop labs

## 2019-07-29 NOTE — Telephone Encounter (Signed)
The patient has been notified of the negative COVID result and verbalized understanding.  All questions (if any) were answered. Wilma Flavin, RN 07/29/2019 5:04 PM

## 2019-07-30 ENCOUNTER — Ambulatory Visit (HOSPITAL_COMMUNITY)
Admission: RE | Admit: 2019-07-30 | Discharge: 2019-07-30 | Disposition: A | Discharge status: Home / Self Care | Payer: Medicare Other | Attending: Cardiology | Admitting: Cardiology

## 2019-07-30 ENCOUNTER — Ambulatory Visit (HOSPITAL_COMMUNITY): Payer: Medicare Other | Admitting: Certified Registered"

## 2019-07-30 ENCOUNTER — Encounter (HOSPITAL_COMMUNITY)
Admission: RE | Disposition: A | Discharge status: Home / Self Care | Payer: Self-pay | Source: Home / Self Care | Attending: Cardiology

## 2019-07-30 ENCOUNTER — Other Ambulatory Visit: Payer: Self-pay

## 2019-07-30 DIAGNOSIS — Z8673 Personal history of transient ischemic attack (TIA), and cerebral infarction without residual deficits: Secondary | ICD-10-CM | POA: Insufficient documentation

## 2019-07-30 DIAGNOSIS — I10 Essential (primary) hypertension: Secondary | ICD-10-CM | POA: Insufficient documentation

## 2019-07-30 DIAGNOSIS — M797 Fibromyalgia: Secondary | ICD-10-CM | POA: Insufficient documentation

## 2019-07-30 DIAGNOSIS — I4819 Other persistent atrial fibrillation: Secondary | ICD-10-CM | POA: Diagnosis not present

## 2019-07-30 DIAGNOSIS — Z79899 Other long term (current) drug therapy: Secondary | ICD-10-CM | POA: Diagnosis not present

## 2019-07-30 DIAGNOSIS — E119 Type 2 diabetes mellitus without complications: Secondary | ICD-10-CM | POA: Diagnosis not present

## 2019-07-30 DIAGNOSIS — Z888 Allergy status to other drugs, medicaments and biological substances status: Secondary | ICD-10-CM | POA: Diagnosis not present

## 2019-07-30 DIAGNOSIS — F329 Major depressive disorder, single episode, unspecified: Secondary | ICD-10-CM | POA: Insufficient documentation

## 2019-07-30 DIAGNOSIS — F419 Anxiety disorder, unspecified: Secondary | ICD-10-CM | POA: Insufficient documentation

## 2019-07-30 DIAGNOSIS — E785 Hyperlipidemia, unspecified: Secondary | ICD-10-CM | POA: Insufficient documentation

## 2019-07-30 DIAGNOSIS — M1990 Unspecified osteoarthritis, unspecified site: Secondary | ICD-10-CM | POA: Insufficient documentation

## 2019-07-30 DIAGNOSIS — Z7984 Long term (current) use of oral hypoglycemic drugs: Secondary | ICD-10-CM | POA: Diagnosis not present

## 2019-07-30 DIAGNOSIS — Z8249 Family history of ischemic heart disease and other diseases of the circulatory system: Secondary | ICD-10-CM | POA: Insufficient documentation

## 2019-07-30 DIAGNOSIS — Z833 Family history of diabetes mellitus: Secondary | ICD-10-CM | POA: Diagnosis not present

## 2019-07-30 DIAGNOSIS — Z7901 Long term (current) use of anticoagulants: Secondary | ICD-10-CM | POA: Diagnosis not present

## 2019-07-30 DIAGNOSIS — I4892 Unspecified atrial flutter: Secondary | ICD-10-CM

## 2019-07-30 DIAGNOSIS — I483 Typical atrial flutter: Secondary | ICD-10-CM | POA: Insufficient documentation

## 2019-07-30 DIAGNOSIS — E1140 Type 2 diabetes mellitus with diabetic neuropathy, unspecified: Secondary | ICD-10-CM | POA: Diagnosis not present

## 2019-07-30 DIAGNOSIS — G2581 Restless legs syndrome: Secondary | ICD-10-CM | POA: Insufficient documentation

## 2019-07-30 DIAGNOSIS — I480 Paroxysmal atrial fibrillation: Secondary | ICD-10-CM | POA: Diagnosis not present

## 2019-07-30 HISTORY — PX: A-FLUTTER ABLATION: EP1230

## 2019-07-30 LAB — GLUCOSE, CAPILLARY
Glucose-Capillary: 107 mg/dL — ABNORMAL HIGH (ref 70–99)
Glucose-Capillary: 119 mg/dL — ABNORMAL HIGH (ref 70–99)

## 2019-07-30 SURGERY — A-FLUTTER ABLATION
Anesthesia: General

## 2019-07-30 MED ORDER — ACETAMINOPHEN 500 MG PO TABS
ORAL_TABLET | ORAL | Status: AC
  Administered 2019-07-30: 1000 mg via ORAL
  Filled 2019-07-30: qty 2

## 2019-07-30 MED ORDER — ONDANSETRON HCL 4 MG/2ML IJ SOLN: 4.0000 mg | Freq: Four times a day (QID) | INTRAMUSCULAR | Status: DC | PRN

## 2019-07-30 MED ORDER — ONDANSETRON HCL 4 MG/2ML IJ SOLN
INTRAMUSCULAR | Status: DC | PRN
  Administered 2019-07-30: 4 mg via INTRAVENOUS

## 2019-07-30 MED ORDER — ACETAMINOPHEN 325 MG PO TABS
650.0000 mg | ORAL_TABLET | ORAL | Status: DC | PRN
  Filled 2019-07-30: qty 2

## 2019-07-30 MED ORDER — HEPARIN (PORCINE) IN NACL 1000-0.9 UT/500ML-% IV SOLN
INTRAVENOUS | Status: DC | PRN
  Administered 2019-07-30: 500 mL

## 2019-07-30 MED ORDER — DEXAMETHASONE SODIUM PHOSPHATE 10 MG/ML IJ SOLN
INTRAMUSCULAR | Status: DC | PRN
  Administered 2019-07-30: 5 mg via INTRAVENOUS

## 2019-07-30 MED ORDER — SUGAMMADEX SODIUM 200 MG/2ML IV SOLN
INTRAVENOUS | Status: DC | PRN
  Administered 2019-07-30: 200 mg via INTRAVENOUS

## 2019-07-30 MED ORDER — BUPIVACAINE HCL (PF) 0.25 % IJ SOLN
INTRAMUSCULAR | Status: AC
  Filled 2019-07-30: qty 30

## 2019-07-30 MED ORDER — PROPOFOL 10 MG/ML IV BOLUS
INTRAVENOUS | Status: DC | PRN
  Administered 2019-07-30: 130 mg via INTRAVENOUS
  Administered 2019-07-30: 20 mg via INTRAVENOUS

## 2019-07-30 MED ORDER — SODIUM CHLORIDE 0.9% FLUSH: 3.0000 mL | INTRAVENOUS | Status: DC | PRN

## 2019-07-30 MED ORDER — SODIUM CHLORIDE 0.9 % IV SOLN: INTRAVENOUS | Status: DC

## 2019-07-30 MED ORDER — ROCURONIUM BROMIDE 50 MG/5ML IV SOSY
PREFILLED_SYRINGE | INTRAVENOUS | Status: DC | PRN
  Administered 2019-07-30: 50 mg via INTRAVENOUS

## 2019-07-30 MED ORDER — ACETAMINOPHEN 500 MG PO TABS
1000.0000 mg | ORAL_TABLET | Freq: Once | ORAL | Status: AC
  Filled 2019-07-30: qty 2

## 2019-07-30 MED ORDER — LIDOCAINE 2% (20 MG/ML) 5 ML SYRINGE
INTRAMUSCULAR | Status: DC | PRN
  Administered 2019-07-30: 80 mg via INTRAVENOUS

## 2019-07-30 MED ORDER — BUPIVACAINE HCL (PF) 0.25 % IJ SOLN
INTRAMUSCULAR | Status: DC | PRN
  Administered 2019-07-30: 30 mL

## 2019-07-30 MED ORDER — HEPARIN (PORCINE) IN NACL 1000-0.9 UT/500ML-% IV SOLN
INTRAVENOUS | Status: AC
  Filled 2019-07-30: qty 1000

## 2019-07-30 MED ORDER — MIDAZOLAM HCL 5 MG/5ML IJ SOLN
INTRAMUSCULAR | Status: DC | PRN
  Administered 2019-07-30: 2 mg via INTRAVENOUS

## 2019-07-30 MED ORDER — SODIUM CHLORIDE 0.9 % IV SOLN
INTRAVENOUS | Status: DC | PRN
  Administered 2019-07-30: 25 ug/min via INTRAVENOUS

## 2019-07-30 MED ORDER — LACTATED RINGERS IV SOLN: INTRAVENOUS | Status: DC | PRN

## 2019-07-30 MED ORDER — SODIUM CHLORIDE 0.9 % IV SOLN: 250.0000 mL | INTRAVENOUS | Status: DC | PRN

## 2019-07-30 MED ORDER — PHENOL 1.4 % MT LIQD
1.0000 | OROMUCOSAL | Status: DC | PRN
  Administered 2019-07-30: 1 via OROMUCOSAL
  Filled 2019-07-30: qty 177

## 2019-07-30 MED ORDER — FENTANYL CITRATE (PF) 100 MCG/2ML IJ SOLN
INTRAMUSCULAR | Status: DC | PRN
  Administered 2019-07-30: 50 ug via INTRAVENOUS

## 2019-07-30 SURGICAL SUPPLY — 14 items
CATH EZ STEER NAV 8MM F-J CUR (ABLATOR) ×1 IMPLANT
CATH JOSEPH QUAD ALLRED 6F REP (CATHETERS) ×1 IMPLANT
CATH WEBSTER BI DIR CS D-F CRV (CATHETERS) ×1 IMPLANT
DEVICE CLOSURE PERCLS PRGLD 6F (VASCULAR PRODUCTS) IMPLANT
PACK EP LATEX FREE (CUSTOM PROCEDURE TRAY) ×2
PACK EP LF (CUSTOM PROCEDURE TRAY) ×1 IMPLANT
PAD PRO RADIOLUCENT 2001M-C (PAD) ×2 IMPLANT
PATCH CARTO3 (PAD) ×1 IMPLANT
PERCLOSE PROGLIDE 6F (VASCULAR PRODUCTS) ×4
SHEATH PINNACLE 6F 10CM (SHEATH) IMPLANT
SHEATH PINNACLE 7F 10CM (SHEATH) ×1 IMPLANT
SHEATH PINNACLE 8F 10CM (SHEATH) ×1 IMPLANT
SHEATH PROBE COVER 6X72 (BAG) ×1 IMPLANT
SHEATH SWARTZ RAMP 8.5F 60CM (SHEATH) ×1 IMPLANT

## 2019-07-30 NOTE — Anesthesia Preprocedure Evaluation (Addendum)
Anesthesia Evaluation  Patient identified by MRN, date of birth, ID band Patient awake    Reviewed: Allergy & Precautions, H&P , NPO status , Patient's Chart, lab work & pertinent test results  Airway Mallampati: II  TM Distance: >3 FB Neck ROM: Full    Dental no notable dental hx. (+) Edentulous Upper, Edentulous Lower, Dental Advisory Given   Pulmonary neg pulmonary ROS,    Pulmonary exam normal breath sounds clear to auscultation       Cardiovascular hypertension, Pt. on medications and Pt. on home beta blockers + dysrhythmias Atrial Fibrillation  Rhythm:Irregular Rate:Normal     Neuro/Psych Anxiety Depression negative neurological ROS     GI/Hepatic negative GI ROS, Neg liver ROS,   Endo/Other  diabetes, Type 2, Oral Hypoglycemic Agents  Renal/GU negative Renal ROS  negative genitourinary   Musculoskeletal  (+) Arthritis , Osteoarthritis,  Fibromyalgia -  Abdominal   Peds  Hematology negative hematology ROS (+)   Anesthesia Other Findings   Reproductive/Obstetrics negative OB ROS                            Anesthesia Physical Anesthesia Plan  ASA: III  Anesthesia Plan: General   Post-op Pain Management:    Induction: Intravenous  PONV Risk Score and Plan: 4 or greater and Ondansetron, Propofol infusion and Treatment may vary due to age or medical condition  Airway Management Planned: Oral ETT  Additional Equipment:   Intra-op Plan:   Post-operative Plan: Extubation in OR  Informed Consent: I have reviewed the patients History and Physical, chart, labs and discussed the procedure including the risks, benefits and alternatives for the proposed anesthesia with the patient or authorized representative who has indicated his/her understanding and acceptance.     Dental advisory given  Plan Discussed with: CRNA  Anesthesia Plan Comments:         Anesthesia Quick  Evaluation

## 2019-07-30 NOTE — Anesthesia Postprocedure Evaluation (Signed)
Anesthesia Post Note  Patient: April Munoz  Procedure(s) Performed: A-FLUTTER ABLATION (N/A )     Patient location during evaluation: PACU Anesthesia Type: General Level of consciousness: awake and alert Pain management: pain level controlled Vital Signs Assessment: post-procedure vital signs reviewed and stable Respiratory status: spontaneous breathing, nonlabored ventilation, respiratory function stable and patient connected to nasal cannula oxygen Cardiovascular status: blood pressure returned to baseline and stable Postop Assessment: no apparent nausea or vomiting Anesthetic complications: no    Last Vitals:  Vitals:   07/30/19 1724 07/30/19 1755  BP: 137/77 128/80  Pulse: (!) 101 (!) 101  Resp: (!) 21 10  Temp:    SpO2: 91% 94%    Last Pain:  Vitals:   07/30/19 1753  TempSrc:   PainSc: 2                  Catalina Gravel

## 2019-07-30 NOTE — Anesthesia Procedure Notes (Deleted)
Procedure Name: MAC Date/Time: 07/30/2019 1:05 PM Performed by: Alain Marion, CRNA Pre-anesthesia Checklist: Patient identified, Emergency Drugs available, Suction available and Patient being monitored Patient Re-evaluated:Patient Re-evaluated prior to induction Oxygen Delivery Method: Circle system utilized Preoxygenation: Pre-oxygenation with 100% oxygen Induction Type: IV induction Ventilation: Two handed mask ventilation required Laryngoscope Size: Mac and 3 Grade View: Grade II Tube type: Oral Tube size: 7.0 mm Number of attempts: 2 Airway Equipment and Method: Bougie stylet Placement Confirmation: ETT inserted through vocal cords under direct vision,  positive ETCO2 and breath sounds checked- equal and bilateral Secured at: 22 cm Tube secured with: Tape Dental Injury: Teeth and Oropharynx as per pre-operative assessment  Difficulty Due To: Difficulty was unanticipated and Difficult Airway- due to anterior larynx

## 2019-07-30 NOTE — Discharge Instructions (Signed)
Post procedure care instructions No driving for 4 days. No lifting over 5 lbs for 1 week. No vigorous or sexual activity for 1 week. You may return to work/your usual activities on 08/06/2019. Keep procedure site clean & dry. If you notice increased pain, swelling, bleeding or pus, call/return!  You may shower, but no soaking baths/hot tubs/pools for 1 week.    Cardiac Ablation, Care After This sheet gives you information about how to care for yourself after your procedure. Your health care provider may also give you more specific instructions. If you have problems or questions, contact your health care provider. What can I expect after the procedure? After the procedure, it is common to have:  Bruising around your puncture site.  Tenderness around your puncture site.  Skipped heartbeats.  Tiredness (fatigue).  Follow these instructions at home: Puncture site care   Follow instructions from your health care provider about how to take care of your puncture site. Make sure you: ? If present, leave stitches (sutures), skin glue, or adhesive strips in place. These skin closures may need to stay in place for up to 2 weeks. If adhesive strip edges start to loosen and curl up, you may trim the loose edges. Do not remove adhesive strips completely unless your health care provider tells you to do that.  Check your puncture site every day for signs of infection. Check for: ? Redness, swelling, or pain. ? Fluid or blood. If your puncture site starts to bleed, lie down on your back, apply firm pressure to the area, and contact your health care provider. ? Warmth. ? Pus or a bad smell. Driving  Do not drive for at least 4 days after your procedure or however long your health care provider recommends.  Do not drive or use heavy machinery while taking prescription pain medicine.  Do not drive for 24 hours if you were given a medicine to help you relax (sedative) during your  procedure. Activity  Avoid activities that take a lot of effort for at least 7 days after your procedure.  Do not lift anything that is heavier than 5 lb (4.5 kg) for one week.   No sexual activity for 1 week.   Return to your normal activities as told by your health care provider. Ask your health care provider what activities are safe for you. General instructions  Take over-the-counter and prescription medicines only as told by your health care provider.  Do not use any products that contain nicotine or tobacco, such as cigarettes and e-cigarettes. If you need help quitting, ask your health care provider.  You may shower after 24 hours, but Do not take baths, swim, or use a hot tub for 1 week.   Do not drink alcohol for 24 hours after your procedure.  Keep all follow-up visits as told by your health care provider. This is important. Contact a health care provider if:  You have redness, mild swelling, or pain around your puncture site.  You have fluid or blood coming from your puncture site that stops after applying firm pressure to the area.  Your puncture site feels warm to the touch.  You have pus or a bad smell coming from your puncture site.  You have a fever.  You have chest pain or discomfort that spreads to your neck, jaw, or arm.  You are sweating a lot.  You feel nauseous.  You have a fast or irregular heartbeat.  You have shortness of breath.  You  are dizzy or light-headed and feel the need to lie down.  You have pain or numbness in the arm or leg closest to your puncture site. Get help right away if:  Your puncture site suddenly swells.  Your puncture site is bleeding and the bleeding does not stop after applying firm pressure to the area. These symptoms may represent a serious problem that is an emergency. Do not wait to see if the symptoms will go away. Get medical help right away. Call your local emergency services (911 in the [place] Do not drive  yourself to the hospital. Summary  After the procedure, it is normal to have bruising and tenderness at the puncture site in your groin, neck, or forearm.  Check your puncture site every day for signs of infection.  Get help right away if your puncture site is bleeding and the bleeding does not stop after applying firm pressure to the area. This is a medical emergency. This information is not intended to replace advice given to you by your health care provider. Make sure you discuss any questions you have with your health care provider.

## 2019-07-30 NOTE — H&P (Signed)
Electrophysiology Office Note   Date:  07/30/2019   ID:  April Munoz, DOB April 12, 1951, MRN [id number]  PCP:  [name], MD  Cardiologist: Bettina Gavia Primary Electrophysiologist:  Constance Haw, MD    No chief complaint on file.    History of Present Illness: April Munoz is a 69 y.o. female who is being seen today for the evaluation of atrial fibrillation at the request of No ref. provider found. Presenting today for electrophysiology evaluation.  She has a history significant for persistent atrial fibrillation, depression, diabetes, hypertension, hyperlipidemia, TIA.  She is currently on propafenone.  She wore a 30-day monitor that showed almost entirely atrial fibrillation.  She does have symptoms of malaise, fatigue, and exercise intolerance.  She has been compliant with anticoagulation.  Per report, she has preserved LV systolic function with moderate left atrial enlargement no valvular heart disease.  She is status post AF ablation 03/21/2019.  Today, denies symptoms of palpitations, chest pain, shortness of breath, orthopnea, PND, lower extremity edema, claudication, dizziness, presyncope, syncope, bleeding, or neurologic sequela. The patient is tolerating medications without difficulties. Plan for ablation today.    Past Medical History:  Diagnosis Date  . Anxiety   . Arthritis   . Atrial fibrillation ([place])   . B12 deficiency 10/31/2017  . Cellulitis of abdominal wall 07/07/2016  . Depression   . Diabetes mellitus without complication ([place])   . Esophageal stricture 06/05/2017  . Essential hypertension 02/24/2013  . Fibromyalgia 02/24/2013  . Full dentures   . Hereditary and idiopathic peripheral neuropathy 02/24/2013  . Hyperlipidemia 02/24/2013  . Hypertension   . Murmur, cardiac 08/15/2015  . Neuropathy in diabetes ([place]) 10/31/2017  . Osteoporosis   . Paroxysmal atrial fibrillation ([place]) 08/15/2015   CHADS2vasc=3 CHADS2vasc=3  . Restless legs syndrome 03/14/2013  .  Thoracic or lumbosacral neuritis or radiculitis 02/24/2013  . Type 2 diabetes mellitus, without long-term current use of insulin ([place]) 02/24/2013  . Wears glasses    Past Surgical History:  Procedure Laterality Date  . ATRIAL FIBRILLATION ABLATION N/A 03/21/2019   Procedure: ATRIAL FIBRILLATION ABLATION;  Surgeon: Constance Haw, MD;  Location: [facility];  Service: Cardiovascular;  Laterality: N/A;  . BREAST REDUCTION SURGERY  1983  . CATARACT EXTRACTION, BILATERAL  2018, 2019  . CHOLECYSTECTOMY  1971   open  . DIAGNOSTIC LAPAROSCOPY  2010   lysis of adhesions  . DILATION AND CURETTAGE OF UTERUS  2004  . KNEE ARTHROSCOPY  1998   left  . KNEE ARTHROSCOPY W/ LATERAL RETINACULAR REPAIR    . MASS EXCISION Left 11/11/2013   Procedure: EXCISION MUCOID CYST LEFT INDEX FINGER/DEBRIDEMENT LEFT INDEX FINGER;  Surgeon: Wynonia Sours, MD;  Location: [facility];  Service: Orthopedics;  Laterality: Left;  ANESTHESIA: IV REGIONAL/FAB  . SHOULDER ARTHROSCOPY W/ ROTATOR CUFF REPAIR  2007   left  . TRIGGER FINGER RELEASE Left 11/11/2013   Procedure: RELEASE A-1 PULLEY LEFT RING FINGER;  Surgeon: Wynonia Sours, MD;  Location: [facility];  Service: Orthopedics;  Laterality: Left;  . UMBILICAL HERNIA REPAIR  2008, 2010     Current Facility-Administered Medications  Medication Dose Route Frequency Provider Last Rate Last Admin  . 0.9 %  sodium chloride infusion   Intravenous Continuous Constance Haw, MD 50 mL/hr at 07/30/19 1006 New Bag at 07/30/19 1006    Allergies:   Prednisone   Social History:  The patient  reports that she has never smoked. She has  never used smokeless tobacco. She reports that she does not drink alcohol or use drugs.   Family History:  The patient's family history includes Atrial fibrillation in her mother; Diabetes in her maternal grandmother; Heart attack in her brother and mother; Heart disease in her brother; Heart failure in her  father and paternal grandfather; Stroke in her paternal grandfather.    ROS:  Please see the history of present illness.   Otherwise, review of systems is positive for none.   All other systems are reviewed and negative.   PHYSICAL EXAM: VS:  BP (!) 167/90   Pulse 73   Temp 98.2 F (36.8 C) (Oral)   Resp 16   Ht 5\' 8"  (1.727 m)   Wt 88 kg   SpO2 96%   BMI 29.50 kg/m  , BMI Body mass index is 29.5 kg/m. GEN: Well nourished, well developed, in no acute distress  HEENT: normal  Neck: no JVD, carotid bruits, or masses Cardiac: RRR; no murmurs, rubs, or gallops,no edema  Respiratory:  clear to auscultation bilaterally, normal work of breathing GI: soft, nontender, nondistended, + BS MS: no deformity or atrophy  Skin: warm and dry Neuro:  Strength and sensation are intact Psych: euthymic mood, full affect   Recent Labs: 07/22/2019: BUN 17; Creatinine, Ser 0.90; Hemoglobin 13.2; Platelets 390; Potassium 4.2; Sodium 141    Lipid Panel  No results found for: CHOL, TRIG, HDL, CHOLHDL, VLDL, LDLCALC, LDLDIRECT   Wt Readings from Last 3 Encounters:  07/30/19 88 kg  06/09/19 88.3 kg  05/27/19 88.5 kg      Other studies Reviewed: Additional studies/ records that were reviewed today include: Monitor 01/29/2019 personally reviewed Review of the above records today demonstrates:  1. atrial fibrillation with good heart rate control 2.  Rare ventricular ectopy   ASSESSMENT AND PLAN:  1.  Persistent atrial fibrillation: on elquis post ablation.   2.  Hypertension: Currently well controlled  3.  Typical atrial flutter.  Plan for ablation  April Munoz has presented today for surgery, with the diagnosis of atrial flutter.  The various methods of treatment have been discussed with the patient and family. After consideration of risks, benefits and other options for treatment, the patient has consented to  Procedure(s): Catheter ablation as a surgical intervention .  Risks include but  not limited to bleeding, tamponade, heart block, stroke, damage to surrounding organs, among others. The patient's history has been reviewed, patient examined, no change in status, stable for surgery.  I have reviewed the patient's chart and labs.  Questions were answered to the patient's satisfaction.    April Collister Curt Bears, MD 07/30/2019 12:24 PM   =

## 2019-07-30 NOTE — Transfer of Care (Signed)
Immediate Anesthesia Transfer of Care Note  Patient: April Munoz  Procedure(s) Performed: A-FLUTTER ABLATION (N/A )  Patient Location: PACU and Cath Lab  Anesthesia Type:General  Level of Consciousness: awake, alert  and oriented  Airway & Oxygen Therapy: Patient Spontanous Breathing and Patient connected to face mask oxygen  Post-op Assessment: Report given to RN and Post -op Vital signs reviewed and stable  Post vital signs: Reviewed and stable  Last Vitals:  Vitals Value Taken Time  BP 149/76 07/30/19 1503  Temp 36.3 C 07/30/19 1459  Pulse 93 07/30/19 1505  Resp 18 07/30/19 1505  SpO2 98 % 07/30/19 1505  Vitals shown include unvalidated device data.  Last Pain:  Vitals:   07/30/19 1459  TempSrc: Temporal  PainSc: Asleep         Complications: No apparent anesthesia complications

## 2019-07-30 NOTE — Anesthesia Procedure Notes (Addendum)
Procedure Name: Intubation Date/Time: 07/30/2019 1:08 PM Performed by: Catalina Gravel, MD Pre-anesthesia Checklist: Patient identified, Emergency Drugs available, Suction available and Patient being monitored Patient Re-evaluated:Patient Re-evaluated prior to induction Oxygen Delivery Method: Circle system utilized Preoxygenation: Pre-oxygenation with 100% oxygen Induction Type: IV induction Ventilation: Two handed mask ventilation required Laryngoscope Size: Mac and 3 Grade View: Grade II Tube type: Oral Tube size: 7.5 mm Number of attempts: 3 Airway Equipment and Method: Stylet,  Oral airway and Bougie stylet Placement Confirmation: ETT inserted through vocal cords under direct vision,  positive ETCO2 and breath sounds checked- equal and bilateral Secured at: 22 cm Tube secured with: Tape Dental Injury: Teeth and Oropharynx as per pre-operative assessment  Difficulty Due To: Difficult Airway- due to anterior larynx Comments: Attempt x1 by SRNA with esophageal intubation with MAC 3 blade, grade 3 view.  2nd attempt by SRNA without improvement in view of glottis.  Attempt x1 by MDA with Grade 2 view using MAC 3 blade, bougie stylet passed through cords, and ETT advanced over bougie via seldinger technique.  +BBS, +EtCO2, + chest rise.  Collins Scotland, MD

## 2019-08-31 NOTE — Progress Notes (Signed)
Cardiology Office Note:    Date:  09/02/2019   ID:  April Munoz, DOB [birth year], MRN [id number]  PCP:  [name], MD  Cardiologist:  Shirlee More, MD    Referring MD: [name], MD    ASSESSMENT:    1. Persistent atrial fibrillation ([place])   2. Typical atrial flutter ([place])   3. Chronic anticoagulation   4. Hypertensive heart disease without heart failure   5. Type 2 diabetes mellitus without complication, without long-term current use of insulin ([place])   6. Mixed hyperlipidemia    PLAN:    In order of problems listed above:  1. 2 arrhythmias improved is in sinus rhythm after both atrial fibrillation flutter ablation 2 separate procedures no longer an antiarrhythmic drug.  We will continue her anticoagulant and beta-blocker.  In particular I would not stop her anticoagulant with her recurrent atrial arrhythmia over time previous stroke 2. BP is elevated increase beta-blocker which should help with resting heart rate blood pressure and perhaps her trigger 3. Stable diabetes last A1c 6.8% 08/13/2018 continue current treatment 4. Stable dyslipidemia her last lipid profile 05/14/2019 cholesterol 227 HDL 74 LDL 91 at target continue statin Essential tremor his family history told an increase beta-blocker may help if unimproved may need to see neurology   Next appointment: 2 months   Medication Adjustments/Labs and Tests Ordered: Current medicines are reviewed at length with the patient today.  Concerns regarding medicines are outlined above.  No orders of the defined types were placed in this encounter.  No orders of the defined types were placed in this encounter.   No chief complaint on file.   History of Present Illness:    April Munoz is a 69 y.o. female with a hx of PAF with CHADS2 vasc of 4 on propafenone,  hypertension  and T2 DM last seen 11/29/16.She carried a 30 day monitor and noted several episodes of atrial flutter and atrial fibrillation . Echocardiogram showed  preserved left ventricular ejection fraction with moderate left atrial enlargement and no valvular heart disease.    She was last seen 06/05/2019 following EP catheter ablation performed 04/10/2019.   I independently reviewed the correspondence from Dr. Curt Bears electrophysiology 06/09/2019 with early recurrence of new arrhythmia atrial flutter and second EP procedure with atrial flutter ablation 07/30/2019.  Compliance with diet, lifestyle and medications: Yes  She feels improved and is enthusiastic after EP procedures.  Home blood pressures running in the range of [id number] systolic and a resting heart rate is higher than before.  Repeat blood pressure by me 142/88.  If negative will increase the dose of the beta-blocker to help with her resting heart rate and blood pressure.  She also has a fine tremor has not been on amiodarone therapy.  There is a familial history of tremor and TSH this was performed 2020 was normal 1.93.  No sustained rapid heart rhythm edema chest pain or syncope.  She tolerates her anticoagulant without bleeding complication Past Medical History:  Diagnosis Date  . Anxiety   . Arthritis   . Atrial fibrillation ([place])   . B12 deficiency 10/31/2017  . Cellulitis of abdominal wall 07/07/2016  . Depression   . Diabetes mellitus without complication ([place])   . Esophageal stricture 06/05/2017  . Essential hypertension 02/24/2013  . Fibromyalgia 02/24/2013  . Full dentures   . Hereditary and idiopathic peripheral neuropathy 02/24/2013  . Hyperlipidemia 02/24/2013  . Hypertension   . Murmur, cardiac 08/15/2015  . Neuropathy in diabetes ([place]) 10/31/2017  .  Osteoporosis   . Paroxysmal atrial fibrillation ([place]) 08/15/2015   CHADS2vasc=3 CHADS2vasc=3  . Restless legs syndrome 03/14/2013  . Thoracic or lumbosacral neuritis or radiculitis 02/24/2013  . Type 2 diabetes mellitus, without long-term current use of insulin ([place]) 02/24/2013  . Wears glasses     Past Surgical History:    Procedure Laterality Date  . A-FLUTTER ABLATION N/A 07/30/2019   Procedure: A-FLUTTER ABLATION;  Surgeon: Constance Haw, MD;  Location: [facility];  Service: Cardiovascular;  Laterality: N/A;  . ATRIAL FIBRILLATION ABLATION N/A 03/21/2019   Procedure: ATRIAL FIBRILLATION ABLATION;  Surgeon: Constance Haw, MD;  Location: [facility];  Service: Cardiovascular;  Laterality: N/A;  . BREAST REDUCTION SURGERY  1983  . CATARACT EXTRACTION, BILATERAL  2018, 2019  . CHOLECYSTECTOMY  1971   open  . DIAGNOSTIC LAPAROSCOPY  2010   lysis of adhesions  . DILATION AND CURETTAGE OF UTERUS  2004  . KNEE ARTHROSCOPY  1998   left  . KNEE ARTHROSCOPY W/ LATERAL RETINACULAR REPAIR    . MASS EXCISION Left 11/11/2013   Procedure: EXCISION MUCOID CYST LEFT INDEX FINGER/DEBRIDEMENT LEFT INDEX FINGER;  Surgeon: Wynonia Sours, MD;  Location: [facility];  Service: Orthopedics;  Laterality: Left;  ANESTHESIA: IV REGIONAL/FAB  . SHOULDER ARTHROSCOPY W/ ROTATOR CUFF REPAIR  2007   left  . TRIGGER FINGER RELEASE Left 11/11/2013   Procedure: RELEASE A-1 PULLEY LEFT RING FINGER;  Surgeon: Wynonia Sours, MD;  Location: [facility];  Service: Orthopedics;  Laterality: Left;  . UMBILICAL HERNIA REPAIR  2008, 2010    Current Medications: Current Meds  Medication Sig  . acyclovir (ZOVIRAX) 800 MG tablet Take 800 mg by mouth 2 (two) times daily as needed (shingles flare).   Marland Kitchen apixaban (ELIQUIS) 5 MG TABS tablet Take 1 tablet (5 mg total) by mouth 2 (two) times daily.  . B Complex-C-Folic Acid TABS Take 1 tablet by mouth daily.  . Biotin 1000 MCG tablet Take 1,000 mcg by mouth daily.  . Calcium-Vitamin D-Vitamin K (VIACTIV CALCIUM PLUS D) 650-12.5-40 MG-MCG-MCG CHEW Chew 1 capsule by mouth daily.  . cyanocobalamin (,VITAMIN B-12,) 1000 MCG/ML injection Inject 1,000 mcg into the muscle every 30 (thirty) days.  Marland Kitchen ibuprofen (ADVIL) 200 MG tablet Take 400 mg by mouth every 6  (six) hours as needed for headache or mild pain.   Marland Kitchen levothyroxine (SYNTHROID) 50 MCG tablet Take 50 mcg by mouth daily before breakfast.   . metFORMIN (GLUCOPHAGE) 1000 MG tablet Take 1,000 mg by mouth 2 (two) times daily with a meal.  . metoprolol succinate (TOPROL-XL) 25 MG 24 hr tablet Take 1 tablet (25 mg total) by mouth daily.  . Multiple Vitamin (MULTIVITAMIN WITH MINERALS) TABS tablet Take 1 tablet by mouth daily.  Marland Kitchen omeprazole (PRILOSEC) 20 MG capsule Take 20 mg by mouth at bedtime.   . pravastatin (PRAVACHOL) 20 MG tablet Take 20 mg by mouth 4 (four) times a week. Sun, Tue, Thu, Sat  . Probiotic Product (PROBIOTIC & ACIDOPHILUS EX ST PO) Take 1 tablet by mouth daily.   Marland Kitchen torsemide (DEMADEX) 20 MG tablet Take 20 mg by mouth daily as needed (swelling).   . venlafaxine XR (EFFEXOR-XR) 75 MG 24 hr capsule Take 75 mg by mouth daily.   . Vitamin D, Ergocalciferol, (DRISDOL) 1.25 MG (50000 UT) CAPS capsule Take 50,000 Units by mouth every Monday.      Allergies:   Prednisone   Social History  Socioeconomic History  . Marital status: Married    Spouse name: Not on file  . Number of children: Not on file  . Years of education: Not on file  . Highest education level: Not on file  Occupational History  . Not on file  Tobacco Use  . Smoking status: Never Smoker  . Smokeless tobacco: Never Used  Substance and Sexual Activity  . Alcohol use: No    Comment: very rare  . Drug use: No  . Sexual activity: Not on file  Other Topics Concern  . Not on file  Social History Narrative  . Not on file   Social Determinants of Health   Financial Resource Strain:   . Difficulty of Paying Living Expenses: Not on file  Food Insecurity:   . Worried About Charity fundraiser in the Last Year: Not on file  . Ran Out of Food in the Last Year: Not on file  Transportation Needs:   . Lack of Transportation (Medical): Not on file  . Lack of Transportation (Non-Medical): Not on file  Physical  Activity:   . Days of Exercise per Week: Not on file  . Minutes of Exercise per Session: Not on file  Stress:   . Feeling of Stress : Not on file  Social Connections:   . Frequency of Communication with Friends and Family: Not on file  . Frequency of Social Gatherings with Friends and Family: Not on file  . Attends Religious Services: Not on file  . Active Member of Clubs or Organizations: Not on file  . Attends Archivist Meetings: Not on file  . Marital Status: Not on file     Family History: The patient's family history includes Atrial fibrillation in her mother; Diabetes in her maternal grandmother; Heart attack in her brother and mother; Heart disease in her brother; Heart failure in her father and paternal grandfather; Stroke in her paternal grandfather. ROS:   Please see the history of present illness.    All other systems reviewed and are negative.  EKGs/Labs/Other Studies Reviewed:    The following studies were reviewed today:  EKG:  EKG ordered today and personally reviewed.  The ekg ordered today demonstrates sinus rhythm and is normal  Recent labs ordered by electrophysiology 07/22/2019 show normal CBC potassium 5.0 and normal creatinine and renal function.  EKG performed 07/30/2019 independently reviewed sinus rhythm and is normal.  Recent Labs: 07/22/2019: BUN 17; Creatinine, Ser 0.90; Hemoglobin 13.2; Platelets 390; Potassium 4.2; Sodium 141  Recent Lipid Panel No results found for: CHOL, TRIG, HDL, CHOLHDL, VLDL, LDLCALC, LDLDIRECT  Physical Exam:    VS:  There were no vitals taken for this visit.    Wt Readings from Last 3 Encounters:  07/30/19 194 lb 0.1 oz (88 kg)  06/09/19 194 lb 9.6 oz (88.3 kg)  05/27/19 195 lb 3.2 oz (88.5 kg)     GEN:  Well nourished, well developed in no acute distress is a fine tremor at rest HEENT: Normal NECK: No JVD; No carotid bruits LYMPHATICS: No lymphadenopathy CARDIAC: RRR, no murmurs, rubs,  gallops RESPIRATORY:  Clear to auscultation without rales, wheezing or rhonchi  ABDOMEN: Soft, non-tender, non-distended MUSCULOSKELETAL:  No edema; No deformity  SKIN: Warm and dry NEUROLOGIC:  Alert and oriented x 3 PSYCHIATRIC:  Normal affect    Signed, Shirlee More, MD  09/02/2019 1:25 PM    [facility]

## 2019-09-02 ENCOUNTER — Encounter: Payer: Self-pay | Admitting: Cardiology

## 2019-09-02 ENCOUNTER — Other Ambulatory Visit: Payer: Self-pay

## 2019-09-02 ENCOUNTER — Ambulatory Visit (INDEPENDENT_AMBULATORY_CARE_PROVIDER_SITE_OTHER): Payer: Medicare Other | Admitting: Cardiology

## 2019-09-02 VITALS — BP 154/102 | HR 88 | Temp 96.9°F | Ht 68.0 in | Wt 199.2 lb

## 2019-09-02 DIAGNOSIS — I4819 Other persistent atrial fibrillation: Secondary | ICD-10-CM | POA: Diagnosis not present

## 2019-09-02 DIAGNOSIS — G250 Essential tremor: Secondary | ICD-10-CM

## 2019-09-02 DIAGNOSIS — Z7901 Long term (current) use of anticoagulants: Secondary | ICD-10-CM

## 2019-09-02 DIAGNOSIS — I119 Hypertensive heart disease without heart failure: Secondary | ICD-10-CM

## 2019-09-02 DIAGNOSIS — E119 Type 2 diabetes mellitus without complications: Secondary | ICD-10-CM

## 2019-09-02 DIAGNOSIS — I483 Typical atrial flutter: Secondary | ICD-10-CM | POA: Diagnosis not present

## 2019-09-02 DIAGNOSIS — E782 Mixed hyperlipidemia: Secondary | ICD-10-CM

## 2019-09-02 MED ORDER — METOPROLOL SUCCINATE ER 25 MG PO TB24: 25.0000 mg | ORAL_TABLET | Freq: Two times a day (BID) | ORAL | 11 refills | Status: DC

## 2019-09-02 NOTE — Patient Instructions (Signed)
Medication Instructions:  Your physician has recommended you make the following change in your medication:  1.  INCREASE the Toprol XL to 1 tablet twice a day  *If you need a refill on your cardiac medications before your next appointment, please call your pharmacy*  Lab Work: None ordered  If you have labs (blood work) drawn today and your tests are completely normal, you will receive your results only by: Marland Kitchen MyChart Message (if you have MyChart) OR . A paper copy in the mail If you have any lab test that is abnormal or we need to change your treatment, we will call you to review the results.  Testing/Procedures: None ordered  Follow-Up: At [facility], you and your health needs are our priority.  As part of our continuing mission to provide you with exceptional heart care, we have created designated Provider Care Teams.  These Care Teams include your primary Cardiologist (physician) and Advanced Practice Providers (APPs -  Physician Assistants and Nurse Practitioners) who all work together to provide you with the care you need, when you need it.  Your next appointment:   6 month(s)  The format for your next appointment:   In Person  Provider:   Shirlee More, MD  Other Instructions

## 2019-09-08 ENCOUNTER — Encounter: Payer: Self-pay | Admitting: Cardiology

## 2019-09-08 ENCOUNTER — Other Ambulatory Visit: Payer: Self-pay

## 2019-09-08 ENCOUNTER — Ambulatory Visit (INDEPENDENT_AMBULATORY_CARE_PROVIDER_SITE_OTHER): Payer: Medicare Other | Admitting: Cardiology

## 2019-09-08 VITALS — BP 148/80 | HR 88 | Ht 68.0 in | Wt 200.2 lb

## 2019-09-08 DIAGNOSIS — I4819 Other persistent atrial fibrillation: Secondary | ICD-10-CM | POA: Diagnosis not present

## 2019-09-08 NOTE — Patient Instructions (Signed)
Medication Instructions:  Your physician recommends that you continue on your current medications as directed. Please refer to the Current Medication list given to you today.  *If you need a refill on your cardiac medications before your next appointment, please call your pharmacy*  Lab Work: None ordered If you have labs (blood work) drawn today and your tests are completely normal, you will receive your results only by: Marland Kitchen MyChart Message (if you have MyChart) OR . A paper copy in the mail If you have any lab test that is abnormal or we need to change your treatment, we will call you to review the results.  Testing/Procedures: None ordered  Follow-Up: At [facility], you and your health needs are our priority.  As part of our continuing mission to provide you with exceptional heart care, we have created designated Provider Care Teams.  These Care Teams include your primary Cardiologist (physician) and Advanced Practice Providers (APPs -  Physician Assistants and Nurse Practitioners) who all work together to provide you with the care you need, when you need it.  Your next appointment:   1 year(s)  The format for your next appointment:   In Person  Provider:   Allegra Lai, MD  Thank you for choosing [facility]!!   Trinidad Curet, RN [phone]

## 2019-09-08 NOTE — Progress Notes (Signed)
Electrophysiology Office Note   Date:  09/08/2019   ID:  April Munoz, DOB 1951/01/20, MRN [id number]  PCP:  [name], MD  Cardiologist: Bettina Gavia Primary Electrophysiologist:  Constance Haw, MD    No chief complaint on file.    History of Present Illness: April Munoz is a 69 y.o. female who is being seen today for the evaluation of atrial fibrillation at the request of Cox, Kirsten, MD. Presenting today for electrophysiology evaluation.  She has a history significant for persistent atrial fibrillation, depression, diabetes, hypertension, hyperlipidemia, TIA.  She is currently on propafenone.  She wore a 30-day monitor that showed almost entirely atrial fibrillation.  She does have symptoms of malaise, fatigue, and exercise intolerance.  She has been compliant with anticoagulation.  Per report, she has preserved LV systolic function with moderate left atrial enlargement no valvular heart disease.  She is status post AF ablation 03/21/2019.  Presented to clinic for her follow-up in atrial flutter.  She is now status post atrial flutter ablation 07/30/2019.  Today, denies symptoms of palpitations, chest pain, shortness of breath, orthopnea, PND, lower extremity edema, claudication, dizziness, presyncope, syncope, bleeding, or neurologic sequela. The patient is tolerating medications without difficulties.  Overall she is doing well since her atrial flutter ablation.  She has had no further episodes of atrial fibrillation or atrial flutter.   Past Medical History:  Diagnosis Date  . Anxiety   . Arthritis   . Atrial fibrillation ([place])   . B12 deficiency 10/31/2017  . Cellulitis of abdominal wall 07/07/2016  . Depression   . Diabetes mellitus without complication ([place])   . Esophageal stricture 06/05/2017  . Essential hypertension 02/24/2013  . Fibromyalgia 02/24/2013  . Full dentures   . Hereditary and idiopathic peripheral neuropathy 02/24/2013  . Hyperlipidemia 02/24/2013  .  Hypertension   . Murmur, cardiac 08/15/2015  . Neuropathy in diabetes ([place]) 10/31/2017  . Osteoporosis   . Paroxysmal atrial fibrillation ([place]) 08/15/2015   CHADS2vasc=3 CHADS2vasc=3  . Restless legs syndrome 03/14/2013  . Thoracic or lumbosacral neuritis or radiculitis 02/24/2013  . Type 2 diabetes mellitus, without long-term current use of insulin ([place]) 02/24/2013  . Wears glasses    Past Surgical History:  Procedure Laterality Date  . A-FLUTTER ABLATION N/A 07/30/2019   Procedure: A-FLUTTER ABLATION;  Surgeon: Constance Haw, MD;  Location: [facility];  Service: Cardiovascular;  Laterality: N/A;  . ATRIAL FIBRILLATION ABLATION N/A 03/21/2019   Procedure: ATRIAL FIBRILLATION ABLATION;  Surgeon: Constance Haw, MD;  Location: [facility];  Service: Cardiovascular;  Laterality: N/A;  . BREAST REDUCTION SURGERY  1983  . CATARACT EXTRACTION, BILATERAL  2018, 2019  . CHOLECYSTECTOMY  1971   open  . DIAGNOSTIC LAPAROSCOPY  2010   lysis of adhesions  . DILATION AND CURETTAGE OF UTERUS  2004  . KNEE ARTHROSCOPY  1998   left  . KNEE ARTHROSCOPY W/ LATERAL RETINACULAR REPAIR    . MASS EXCISION Left 11/11/2013   Procedure: EXCISION MUCOID CYST LEFT INDEX FINGER/DEBRIDEMENT LEFT INDEX FINGER;  Surgeon: Wynonia Sours, MD;  Location: [facility];  Service: Orthopedics;  Laterality: Left;  ANESTHESIA: IV REGIONAL/FAB  . SHOULDER ARTHROSCOPY W/ ROTATOR CUFF REPAIR  2007   left  . TRIGGER FINGER RELEASE Left 11/11/2013   Procedure: RELEASE A-1 PULLEY LEFT RING FINGER;  Surgeon: Wynonia Sours, MD;  Location: [facility];  Service: Orthopedics;  Laterality: Left;  . UMBILICAL HERNIA REPAIR  2008,  2010     Current Outpatient Medications  Medication Sig Dispense Refill  . acyclovir (ZOVIRAX) 800 MG tablet Take 800 mg by mouth 2 (two) times daily as needed (shingles flare).     Marland Kitchen apixaban (ELIQUIS) 5 MG TABS tablet Take 1 tablet (5 mg total) by mouth 2  (two) times daily. 180 tablet 3  . B Complex-C-Folic Acid TABS Take 1 tablet by mouth daily.    . Biotin 1000 MCG tablet Take 1,000 mcg by mouth daily.    . Calcium-Vitamin D-Vitamin K (VIACTIV CALCIUM PLUS D) 650-12.5-40 MG-MCG-MCG CHEW Chew 1 capsule by mouth daily.    . cyanocobalamin (,VITAMIN B-12,) 1000 MCG/ML injection Inject 1,000 mcg into the muscle every 30 (thirty) days.    Marland Kitchen ibuprofen (ADVIL) 200 MG tablet Take 400 mg by mouth every 6 (six) hours as needed for headache or mild pain.     Marland Kitchen levothyroxine (SYNTHROID) 50 MCG tablet Take 50 mcg by mouth daily before breakfast.     . metFORMIN (GLUCOPHAGE) 1000 MG tablet Take 1,000 mg by mouth 2 (two) times daily with a meal.    . metoprolol succinate (TOPROL XL) 25 MG 24 hr tablet Take 1 tablet (25 mg total) by mouth in the morning and at bedtime. 60 tablet 11  . Multiple Vitamin (MULTIVITAMIN WITH MINERALS) TABS tablet Take 1 tablet by mouth daily.    Marland Kitchen omeprazole (PRILOSEC) 20 MG capsule Take 20 mg by mouth at bedtime.     . pravastatin (PRAVACHOL) 20 MG tablet Take 20 mg by mouth 4 (four) times a week. Sun, Tue, Thu, Sat    . Probiotic Product (PROBIOTIC & ACIDOPHILUS EX ST PO) Take 1 tablet by mouth daily.     Marland Kitchen torsemide (DEMADEX) 20 MG tablet Take 20 mg by mouth daily as needed (swelling).     . venlafaxine XR (EFFEXOR-XR) 75 MG 24 hr capsule Take 75 mg by mouth daily.     . Vitamin D, Ergocalciferol, (DRISDOL) 1.25 MG (50000 UT) CAPS capsule Take 50,000 Units by mouth every Monday.      No current facility-administered medications for this visit.    Allergies:   Prednisone   Social History:  The patient  reports that she has never smoked. She has never used smokeless tobacco. She reports that she does not drink alcohol or use drugs.   Family History:  The patient's family history includes Atrial fibrillation in her mother; Diabetes in her maternal grandmother; Heart attack in her brother and mother; Heart disease in her brother;  Heart failure in her father and paternal grandfather; Stroke in her paternal grandfather.    ROS:  Please see the history of present illness.   Otherwise, review of systems is positive for none.   All other systems are reviewed and negative.   PHYSICAL EXAM: VS:  BP (!) 148/80   Pulse 88   Ht 5\' 8"  (1.727 m)   Wt 200 lb 3.2 oz (90.8 kg)   SpO2 95%   BMI 30.44 kg/m  , BMI Body mass index is 30.44 kg/m. GEN: Well nourished, well developed, in no acute distress  HEENT: normal  Neck: no JVD, carotid bruits, or masses Cardiac: RRR; no murmurs, rubs, or gallops,no edema  Respiratory:  clear to auscultation bilaterally, normal work of breathing GI: soft, nontender, nondistended, + BS MS: no deformity or atrophy  Skin: warm and dry Neuro:  Strength and sensation are intact Psych: euthymic mood, full affect  EKG:  EKG is  not ordered today. Personal review of the ekg ordered 09/03/19 shows, rate 88  Recent Labs: 07/22/2019: BUN 17; Creatinine, Ser 0.90; Hemoglobin 13.2; Platelets 390; Potassium 4.2; Sodium 141    Lipid Panel  No results found for: CHOL, TRIG, HDL, CHOLHDL, VLDL, LDLCALC, LDLDIRECT   Wt Readings from Last 3 Encounters:  09/08/19 200 lb 3.2 oz (90.8 kg)  09/02/19 199 lb 3.2 oz (90.4 kg)  07/30/19 194 lb 0.1 oz (88 kg)      Other studies Reviewed: Additional studies/ records that were reviewed today include: Monitor 01/29/2019 personally reviewed Review of the above records today demonstrates:  1. atrial fibrillation with good heart rate control 2.  Rare ventricular ectopy   ASSESSMENT AND PLAN:  1.  Persistent atrial fibrillation: On Eliquis and propafenone.  Status post ablation 03/21/2019.  CHA2DS2-VASc of 6.  Mains in sinus rhythm   2.  Hypertension: Well-controlled  3.  Typical atrial flutter.  Post ablation 05/29/2020.  Remains in sinus rhythm  Case discussed with primary cardiologist  Current medicines are reviewed at length with the patient today.    The patient does not have concerns regarding her medicines.  The following changes were made today: None  Labs/ tests ordered today include:  No orders of the defined types were placed in this encounter.   Disposition:   FU with Tameisha Covell 12 months  Signed, Solomia Harrell Meredith Leeds, MD  09/08/2019 12:17 PM     [address] [place] [address] [phone] (office) [phone] (fax)

## 2019-09-10 ENCOUNTER — Telehealth: Payer: Self-pay | Admitting: Cardiology

## 2019-09-10 NOTE — Telephone Encounter (Signed)
New Message     Pt is calling and is wanting Sherri to call her back     Please call

## 2019-09-12 ENCOUNTER — Encounter: Payer: Self-pay | Admitting: Family Medicine

## 2019-09-12 ENCOUNTER — Ambulatory Visit (INDEPENDENT_AMBULATORY_CARE_PROVIDER_SITE_OTHER): Payer: Medicare Other | Admitting: Family Medicine

## 2019-09-12 DIAGNOSIS — E034 Atrophy of thyroid (acquired): Secondary | ICD-10-CM

## 2019-09-15 ENCOUNTER — Ambulatory Visit: Payer: Medicare Other | Admitting: Family Medicine

## 2019-09-15 NOTE — Telephone Encounter (Signed)
Pt calling to check on approval to have dental implants.  She has lower dentures, with bone loss.  Dentist thinks implants is best option. She would like to know if Dr. Curt Bears agreeable to implants, no contraindication, and stopping blood thinner if needed. Ok to leave a detailed message per pt if she doesn't answer when calling back. Aware I will let her know by Thursday.  Pt agreeable.

## 2019-09-16 ENCOUNTER — Other Ambulatory Visit: Payer: Self-pay

## 2019-09-16 ENCOUNTER — Encounter: Payer: Self-pay | Admitting: Family Medicine

## 2019-09-16 ENCOUNTER — Ambulatory Visit (INDEPENDENT_AMBULATORY_CARE_PROVIDER_SITE_OTHER): Payer: Medicare Other | Admitting: Family Medicine

## 2019-09-16 VITALS — BP 136/82 | HR 80 | Temp 97.5°F | Ht 68.0 in | Wt 199.0 lb

## 2019-09-16 DIAGNOSIS — F330 Major depressive disorder, recurrent, mild: Secondary | ICD-10-CM

## 2019-09-16 DIAGNOSIS — I4811 Longstanding persistent atrial fibrillation: Secondary | ICD-10-CM

## 2019-09-16 DIAGNOSIS — E559 Vitamin D deficiency, unspecified: Secondary | ICD-10-CM

## 2019-09-16 DIAGNOSIS — E1121 Type 2 diabetes mellitus with diabetic nephropathy: Secondary | ICD-10-CM | POA: Insufficient documentation

## 2019-09-16 DIAGNOSIS — N3941 Urge incontinence: Secondary | ICD-10-CM | POA: Insufficient documentation

## 2019-09-16 DIAGNOSIS — E782 Mixed hyperlipidemia: Secondary | ICD-10-CM

## 2019-09-16 DIAGNOSIS — E038 Other specified hypothyroidism: Secondary | ICD-10-CM

## 2019-09-16 DIAGNOSIS — F331 Major depressive disorder, recurrent, moderate: Secondary | ICD-10-CM | POA: Insufficient documentation

## 2019-09-16 HISTORY — DX: Urge incontinence: N39.41

## 2019-09-16 HISTORY — DX: Longstanding persistent atrial fibrillation: I48.11

## 2019-09-16 HISTORY — DX: Other specified hypothyroidism: E03.8

## 2019-09-16 HISTORY — DX: Type 2 diabetes mellitus with diabetic nephropathy: E11.21

## 2019-09-16 HISTORY — DX: Major depressive disorder, recurrent, mild: F33.0

## 2019-09-16 HISTORY — DX: Vitamin D deficiency, unspecified: E55.9

## 2019-09-16 NOTE — Progress Notes (Signed)
Subjective:  Patient ID: April Munoz, female    DOB: 13-Nov-1950  Age: 69 y.o. MRN: [id number]  Chief Complaint  Patient presents with  . Hyperlipidemia  . Hypertension  . Gastroesophageal Reflux  . Depression  . Atrial Fibrillation    HPI Patient is a 69 year old white female in for follow-up of her hyperlipidemia, diabetes, hypertension, atrial fibrillation, reflux, and depression.  In terms of her hyperlipidemia she is taking pravastatin 20 mg 1-2 at most a week.  She tries to eat healthy following a low-fat diet.  Patient has diabetic nephropathy.  Her sugars are generally well controlled.  She was diagnosed at the age of 35.  Her last A1c was 6.8.  She takes Metformin 1000 mg twice daily.  She has diagnosis of hypertension and atrial fibrillation.  She has had a significant cardiac work-up this year.  She had an ablation May 30, 2019.  She is currently on Toprol-XL 25 mg 1 p.o. twice daily.  Prior to this ablation she had 2 falls in the previous 6 months.  Since that time she has had no falls.  Although she does not exercise she is trying to eat healthy.  Her blood pressure is well controlled.  Patient has acid reflux which is well controlled on omeprazole.  Patient has problems with depression and anxiety.  She is currently on venlafaxine 75 mg extended release 3 in a.m.  This does very well for her.  She does get very frustrated when she cannot find the words she needs.  She is currently seeing neurology.  She has a history of TIAs but to my knowledge no strokes.    Social History   Socioeconomic History  . Marital status: Married    Spouse name: Not on file  . Number of children: Not on file  . Years of education: Not on file  . Highest education level: Not on file  Occupational History  . Occupation: retired  Tobacco Use  . Smoking status: Never Smoker  . Smokeless tobacco: Never Used  Substance and Sexual Activity  . Alcohol use: Yes    Alcohol/week: 1.0 - 2.0  standard drinks    Types: 1 - 2 Glasses of wine per week    Comment: 4 times a week  . Drug use: No  . Sexual activity: Not on file  Other Topics Concern  . Not on file  Social History Narrative  . Not on file   Social Determinants of Health   Financial Resource Strain:   . Difficulty of Paying Living Expenses:   Food Insecurity:   . Worried About Charity fundraiser in the Last Year:   . Arboriculturist in the Last Year:   Transportation Needs:   . Film/video editor (Medical):   Marland Kitchen Lack of Transportation (Non-Medical):   Physical Activity:   . Days of Exercise per Week:   . Minutes of Exercise per Session:   Stress:   . Feeling of Stress :   Social Connections:   . Frequency of Communication with Friends and Family:   . Frequency of Social Gatherings with Friends and Family:   . Attends Religious Services:   . Active Member of Clubs or Organizations:   . Attends Archivist Meetings:   Marland Kitchen Marital Status:    Past Medical History:  Diagnosis Date  . Anxiety   . Arthritis   . Ataxic gait   . Atrial fibrillation ([place])   . Atrophy of thyroid   .  B12 deficiency 10/31/2017  . Cellulitis of abdominal wall 07/07/2016  . Depression   . Diabetes mellitus without complication ([place])   . Esophageal stricture 06/05/2017  . Essential hypertension 02/24/2013  . Fibromyalgia 02/24/2013  . Full dentures   . Hereditary and idiopathic peripheral neuropathy 02/24/2013  . Hyperlipidemia 02/24/2013  . Hypertension   . Incontinence   . Insomnia   . Murmur, cardiac 08/15/2015  . Neuropathy in diabetes ([place]) 10/31/2017  . Osteoporosis   . Other amnesia   . Other amnesia   . 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   . Paroxysmal atrial fibrillation ([place]) 08/15/2015   CHADS2vasc=3 CHADS2vasc=3  . Primary insomnia   . Restless legs syndrome 03/14/2013  . Thoracic or lumbosacral neuritis or radiculitis 02/24/2013  . Type 2 diabetes mellitus, without  long-term current use of insulin ([place]) 02/24/2013  . Wears glasses    Family History  Problem Relation Age of Onset  . Heart failure Father   . Heart disease Brother   . Heart attack Brother   . Heart failure Paternal Grandfather   . Stroke Paternal Grandfather   . Atrial fibrillation Mother   . Heart attack Mother   . Diabetes Maternal Grandmother     Review of Systems  Constitutional: Positive for fatigue. Negative for chills and fever.  HENT: Negative for congestion, ear pain and sore throat.   Respiratory: Negative for cough and shortness of breath.   Cardiovascular: Negative for chest pain.  Gastrointestinal: Positive for diarrhea. Negative for abdominal pain, constipation, nausea and vomiting.  Endocrine: Negative for polydipsia, polyphagia and polyuria.  Genitourinary: Positive for urgency. Negative for dysuria.       Patient reports urge incontinence and the feeling of not fully emptying her bladder for 6 months.  Musculoskeletal: Negative for arthralgias and myalgias.  Neurological: Negative for dizziness and headaches.  Psychiatric/Behavioral: Negative for dysphoric mood. The patient is not nervous/anxious.      Objective:  BP 136/82 (BP Location: Right Arm, Patient Position: Sitting)   Pulse 80   Temp (!) 97.5 F (36.4 C) (Temporal)   Ht 5\' 8"  (1.727 m)   Wt 199 lb (90.3 kg)   SpO2 99%   BMI 30.26 kg/m   BP/Weight 09/16/2019 09/08/2019 [id number]  Systolic BP [id number] [id number] 123456  Diastolic BP 82 80 [id number]  Wt. (Lbs) 199 200.2 199.2  BMI 30.26 30.44 30.29    Physical Exam Vitals reviewed.  Constitutional:      Appearance: Normal appearance. She is normal weight.  Cardiovascular:     Rate and Rhythm: Normal rate and regular rhythm.     Pulses: Normal pulses.     Heart sounds: Normal heart sounds.  Pulmonary:     Effort: Pulmonary effort is normal. No respiratory distress.     Breath sounds: Normal breath sounds.  Abdominal:     General: Abdomen is flat. Bowel  sounds are normal.     Palpations: Abdomen is soft.     Tenderness: There is no abdominal tenderness.  Neurological:     Mental Status: She is alert and oriented to person, place, and time.  Psychiatric:        Mood and Affect: Mood normal.        Behavior: Behavior normal.    Diabetic Foot Exam - Simple   Simple Foot Form Visual Inspection No deformities, no ulcerations, no other skin breakdown bilaterally: Yes Sensation Testing Intact to touch and monofilament testing bilaterally: Yes Pulse Check Posterior Tibialis  and Dorsalis pulse intact bilaterally: Yes Comments    Lab Results  Component Value Date   WBC 7.1 09/18/2019   HGB 12.2 09/18/2019   HCT 36.6 09/18/2019   PLT 356 09/18/2019   GLUCOSE 129 (H) 09/18/2019   CHOL 183 09/18/2019   TRIG 215 (H) 09/18/2019   HDL 61 09/18/2019   LDLCALC 86 09/18/2019   AST 12 09/18/2019   NA 143 09/18/2019   K 4.2 09/18/2019   CL 107 (H) 09/18/2019   CREATININE 0.89 09/18/2019   BUN 13 09/18/2019   CO2 26 07/22/2019   HGBA1C 6.7 (H) 09/18/2019   MICROALBUR 150 09/18/2019      Assessment & Plan:  Longstanding persistent atrial fibrillation ([place]) Well controlled.  No changes to medicines.  Continue to work on eating a healthy diet and exercise.  Labs drawn today.  Follow-up with cardiology as previously scheduled.  Diabetic glomerulopathy ([place]) Well controlled.  No changes to medicines.  Continue to work on eating a healthy diet and exercise.  Labs drawn today.  Check feet daily.  Check sugars daily. Get annual eye exam. Add lisinopril 5 mg once daily.   Secondary hypothyroidism Well-controlled.  Check labs today.  Continue current medications.  Urge incontinence of urine Check urine.  Depression, major, recurrent, mild ([place]) Well-controlled.  Continue current medications.  Mild vitamin D deficiency Check vitamin D level.  Orders Placed This Encounter  Procedures  . CBC with Differential/Platelet  .  Hemoglobin A1c  . Lipid panel  . Comp. Metabolic Panel (12)  . VITAMIN D 25 Hydroxy (Vit-D Deficiency, Fractures)    Standing Status:   Future    Standing Expiration Date:   09/15/2020  . Cardiovascular Risk Assessment  . POCT UA - Microalbumin  . POCT URINALYSIS DIP (CLINITEK)    Meds ordered this encounter  Medications  . Vitamin D, Ergocalciferol, (DRISDOL) 1.25 MG (50000 UNIT) CAPS capsule    Sig: Take 1 capsule (50,000 Units total) by mouth every Monday.    Dispense:  12 capsule    Refill:  0  . torsemide (DEMADEX) 20 MG tablet    Sig: Take 1 tablet (20 mg total) by mouth daily as needed (swelling).    Dispense:  90 tablet    Refill:  0  . metFORMIN (GLUCOPHAGE) 1000 MG tablet    Sig: Take 1 tablet (1,000 mg total) by mouth 2 (two) times daily with a meal.    Dispense:  90 tablet    Refill:  0  . venlafaxine XR (EFFEXOR-XR) 75 MG 24 hr capsule    Sig: Take 1 capsule (75 mg total) by mouth daily.    Dispense:  90 capsule    Refill:  0  . zolpidem (AMBIEN) 5 MG tablet    Sig: Take 1 tablet (5 mg total) by mouth at bedtime as needed.    Dispense:  30 tablet    Refill:  2       Follow-up: Return in about 4 months (around 01/16/2020) for chronic visit. needs labs this week fasting. Marland Kitchen  AVS was given to patient prior to departure.  [name] [facility] [phone]

## 2019-09-18 ENCOUNTER — Other Ambulatory Visit: Payer: Self-pay

## 2019-09-18 ENCOUNTER — Other Ambulatory Visit: Payer: Medicare Other

## 2019-09-18 DIAGNOSIS — E1121 Type 2 diabetes mellitus with diabetic nephropathy: Secondary | ICD-10-CM | POA: Diagnosis not present

## 2019-09-18 DIAGNOSIS — E782 Mixed hyperlipidemia: Secondary | ICD-10-CM | POA: Diagnosis not present

## 2019-09-18 LAB — POCT UA - MICROALBUMIN: Microalbumin Ur, POC: 150 mg/L

## 2019-09-18 LAB — POCT URINALYSIS DIP (CLINITEK)
Bilirubin, UA: NEGATIVE
Blood, UA: NEGATIVE
Glucose, UA: NEGATIVE mg/dL
Leukocytes, UA: NEGATIVE
Nitrite, UA: NEGATIVE
Spec Grav, UA: >=1.03 — AB (ref 1.010–1.025)
Urobilinogen, UA: 0.2 E.U./dL
pH, UA: 6 (ref 5.0–8.0)

## 2019-09-18 MED ORDER — VITAMIN D (ERGOCALCIFEROL) 1.25 MG (50000 UNIT) PO CAPS: 50000.0000 [IU] | ORAL_CAPSULE | ORAL | 0 refills | Status: DC

## 2019-09-18 MED ORDER — TORSEMIDE 20 MG PO TABS: 20.0000 mg | ORAL_TABLET | Freq: Every day | ORAL | 0 refills | Status: DC | PRN

## 2019-09-18 MED ORDER — METFORMIN HCL 1000 MG PO TABS: 1000.0000 mg | ORAL_TABLET | Freq: Two times a day (BID) | ORAL | 0 refills | Status: DC

## 2019-09-18 MED ORDER — VENLAFAXINE HCL ER 75 MG PO CP24: 75.0000 mg | ORAL_CAPSULE | Freq: Every day | ORAL | 0 refills | Status: DC

## 2019-09-18 MED ORDER — ZOLPIDEM TARTRATE 5 MG PO TABS: 5.0000 mg | ORAL_TABLET | Freq: Every evening | ORAL | 2 refills | Status: DC | PRN

## 2019-09-18 NOTE — Telephone Encounter (Signed)
Left detailed message (ok per pt) informing her that Dr .Curt Bears is agreeable to clearing her for dental implants. Advised to have dental office fax clearance request to our office and we would address and to include if they want blood thinner held for implants. Asked pt to call office if she had further questions.

## 2019-09-19 LAB — COMP. METABOLIC PANEL (12)
AST: 12 IU/L (ref 0–40)
Albumin/Globulin Ratio: 1.9 (ref 1.2–2.2)
Albumin: 3.9 g/dL (ref 3.8–4.8)
Alkaline Phosphatase: 61 IU/L (ref 39–117)
BUN/Creatinine Ratio: 15 (ref 12–28)
BUN: 13 mg/dL (ref 8–27)
Bilirubin Total: 0.3 mg/dL (ref 0.0–1.2)
Calcium: 9 mg/dL (ref 8.7–10.3)
Chloride: 107 mmol/L — ABNORMAL HIGH (ref 96–106)
Creatinine, Ser: 0.89 mg/dL (ref 0.57–1.00)
GFR calc Af Amer: 76 mL/min/{1.73_m2} (ref >59)
GFR calc non Af Amer: 66 mL/min/{1.73_m2} (ref >59)
Globulin, Total: 2.1 g/dL (ref 1.5–4.5)
Glucose: 129 mg/dL — ABNORMAL HIGH (ref 65–99)
Potassium: 4.2 mmol/L (ref 3.5–5.2)
Sodium: 143 mmol/L (ref 134–144)
Total Protein: 6 g/dL (ref 6.0–8.5)

## 2019-09-19 LAB — CBC WITH DIFFERENTIAL/PLATELET
Basophils Absolute: 0.1 10*3/uL (ref 0.0–0.2)
Basos: 1 %
EOS (ABSOLUTE): 0.2 10*3/uL (ref 0.0–0.4)
Eos: 3 %
Hematocrit: 36.6 % (ref 34.0–46.6)
Hemoglobin: 12.2 g/dL (ref 11.1–15.9)
Immature Grans (Abs): 0 10*3/uL (ref 0.0–0.1)
Immature Granulocytes: 0 %
Lymphocytes Absolute: 3 10*3/uL (ref 0.7–3.1)
Lymphs: 42 %
MCH: 29.7 pg (ref 26.6–33.0)
MCHC: 33.3 g/dL (ref 31.5–35.7)
MCV: 89 fL (ref 79–97)
Monocytes Absolute: 0.5 10*3/uL (ref 0.1–0.9)
Monocytes: 7 %
Neutrophils Absolute: 3.3 10*3/uL (ref 1.4–7.0)
Neutrophils: 47 %
Platelets: 356 10*3/uL (ref 150–450)
RBC: 4.11 x10E6/uL (ref 3.77–5.28)
RDW: 13.1 % (ref 11.7–15.4)
WBC: 7.1 10*3/uL (ref 3.4–10.8)

## 2019-09-19 LAB — LIPID PANEL
Chol/HDL Ratio: 3 ratio (ref 0.0–4.4)
Cholesterol, Total: 183 mg/dL (ref 100–199)
HDL: 61 mg/dL (ref >39)
LDL Chol Calc (NIH): 86 mg/dL (ref 0–99)
Triglycerides: 215 mg/dL — ABNORMAL HIGH (ref 0–149)
VLDL Cholesterol Cal: 36 mg/dL (ref 5–40)

## 2019-09-19 LAB — HEMOGLOBIN A1C
Est. average glucose Bld gHb Est-mCnc: 146 mg/dL
Hgb A1c MFr Bld: 6.7 % — ABNORMAL HIGH (ref 4.8–5.6)

## 2019-09-19 LAB — CARDIOVASCULAR RISK ASSESSMENT

## 2019-09-23 ENCOUNTER — Other Ambulatory Visit: Payer: Self-pay

## 2019-09-23 MED ORDER — OMEGA-3-ACID ETHYL ESTERS 1 G PO CAPS: 1.0000 g | ORAL_CAPSULE | Freq: Two times a day (BID) | ORAL | 2 refills | Status: DC

## 2019-09-23 MED ORDER — LOSARTAN POTASSIUM 50 MG PO TABS: 50.0000 mg | ORAL_TABLET | Freq: Every day | ORAL | 2 refills | Status: DC

## 2019-09-28 NOTE — Patient Instructions (Signed)
Longstanding persistent atrial fibrillation ([facility]) Well controlled.  No changes to medicines.  Continue to work on eating a healthy diet and exercise.  Labs drawn today.  Follow-up with cardiology as previously scheduled.  Diabetic glomerulopathy ([place]) Well controlled.  No changes to medicines.  Continue to work on eating a healthy diet and exercise.  Labs drawn today.  Check feet daily.  Check sugars daily. Get annual eye exam.  Secondary hypothyroidism Well-controlled.  Check labs today.  Continue current medications.  Urge incontinence of urine Check urine.  Depression, major, recurrent, mild ([place]) Well-controlled.  Continue current medications.  Mild vitamin D deficiency Check vitamin D level.

## 2019-09-28 NOTE — Assessment & Plan Note (Signed)
Well controlled.  No changes to medicines.  Continue to work on eating a healthy diet and exercise.  Labs drawn today.  Follow-up with cardiology as previously scheduled.

## 2019-09-28 NOTE — Assessment & Plan Note (Signed)
Well controlled. Continue current medications  

## 2019-09-28 NOTE — Assessment & Plan Note (Signed)
Well controlled.  No changes to medicines.  Continue to work on eating a healthy diet and exercise.  Labs drawn today.  Check feet daily.  Check sugars daily. Get annual eye exam.

## 2019-09-28 NOTE — Assessment & Plan Note (Signed)
Check urine.

## 2019-09-28 NOTE — Assessment & Plan Note (Signed)
Check vitamin D level 

## 2019-09-28 NOTE — Assessment & Plan Note (Signed)
Well-controlled.  Check labs today.  Continue current medications.

## 2019-10-06 ENCOUNTER — Other Ambulatory Visit: Payer: Medicare Other

## 2019-10-06 ENCOUNTER — Other Ambulatory Visit: Payer: Self-pay

## 2019-10-06 ENCOUNTER — Telehealth: Payer: Self-pay

## 2019-10-06 DIAGNOSIS — K219 Gastro-esophageal reflux disease without esophagitis: Secondary | ICD-10-CM | POA: Diagnosis not present

## 2019-10-06 DIAGNOSIS — K2270 Barrett's esophagus without dysplasia: Secondary | ICD-10-CM | POA: Diagnosis not present

## 2019-10-06 DIAGNOSIS — E1121 Type 2 diabetes mellitus with diabetic nephropathy: Secondary | ICD-10-CM

## 2019-10-06 NOTE — Telephone Encounter (Signed)
-----   Message from [name], MD sent at 09/28/2019  9:32 PM EDT ----- Regarding: Nephropathy April Munoz was spilling protein in her urine.  She needs to start 5 mg once daily. Return for CMP in 1 week.  Also she has been having some urge incontinence. Please ask if she wanted me to refer her to a urologist or did she already have one.  Thanks, Dr. Tobie Poet

## 2019-10-06 NOTE — Telephone Encounter (Signed)
Sabirin is doing better with the [place].  She is going to call us back if she decides to proceed with urology referral.  She was scheduled for repeat CMP.

## 2019-10-07 LAB — COMPREHENSIVE METABOLIC PANEL
ALT: 21 IU/L (ref 0–32)
AST: 18 IU/L (ref 0–40)
Albumin/Globulin Ratio: 2 (ref 1.2–2.2)
Albumin: 4 g/dL (ref 3.8–4.8)
Alkaline Phosphatase: 66 IU/L (ref 39–117)
BUN/Creatinine Ratio: 16 (ref 12–28)
BUN: 16 mg/dL (ref 8–27)
Bilirubin Total: 0.3 mg/dL (ref 0.0–1.2)
CO2: 22 mmol/L (ref 20–29)
Calcium: 8.8 mg/dL (ref 8.7–10.3)
Chloride: 107 mmol/L — ABNORMAL HIGH (ref 96–106)
Creatinine, Ser: 0.99 mg/dL (ref 0.57–1.00)
GFR calc Af Amer: 67 mL/min/{1.73_m2} (ref >59)
GFR calc non Af Amer: 58 mL/min/{1.73_m2} — ABNORMAL LOW (ref >59)
Globulin, Total: 2 g/dL (ref 1.5–4.5)
Glucose: 131 mg/dL — ABNORMAL HIGH (ref 65–99)
Potassium: 4.8 mmol/L (ref 3.5–5.2)
Sodium: 143 mmol/L (ref 134–144)
Total Protein: 6 g/dL (ref 6.0–8.5)

## 2019-10-08 DIAGNOSIS — K224 Dyskinesia of esophagus: Secondary | ICD-10-CM | POA: Diagnosis not present

## 2019-10-08 DIAGNOSIS — R1310 Dysphagia, unspecified: Secondary | ICD-10-CM | POA: Diagnosis not present

## 2019-10-09 NOTE — Telephone Encounter (Signed)
This encounter was created in error - please disregard.

## 2019-10-10 ENCOUNTER — Telehealth: Payer: Self-pay | Admitting: Cardiology

## 2019-10-10 NOTE — Telephone Encounter (Signed)
Call returned.  Advised no clearance received.  Office will refax clearance to [employer] office.  Pt has appt scheduled TODAY for 1:00 pm and will need clearance completed.  Per discussion with [place] office, Dr. Bettina Gavia advised clearance be sent to Dr. Curt Bears for review.  Will forward.

## 2019-10-10 NOTE — Telephone Encounter (Signed)
I left a message for [facility] to please call the office and ask for the pre op team. Need to verify procedure, if any dental extractions and how many teeth to be extracted, type of anesthesia to be used, date os procedure.

## 2019-10-10 NOTE — Telephone Encounter (Signed)
[facility] office calling in regards to a medication clearance that was faxed to the [place], [place] office on 10/08/19. Please return call to discuss clearance at [phone].

## 2019-10-10 NOTE — Telephone Encounter (Signed)
Dental office called and given our office fax number to fax clearance request to. Aware that once received we will address request via our pre-op team. Dental office appreciates the return call and will call pt to reschedule her appt.

## 2019-10-13 NOTE — Telephone Encounter (Signed)
Patient greater than 1 month out from atrial flutter ablation. OK to hold anticoagulation for short time around surgery.

## 2019-10-13 NOTE — Telephone Encounter (Signed)
Dr. Curt Bears  Pt had a flutter ablation 07/30/19 can she be off of eliquis for 2 teeth implants and 2 alvoloplasty?  Thanks

## 2019-10-13 NOTE — Telephone Encounter (Signed)
Patient with diagnosis of atrial flutter on Eliquis for anticoagulation.    Procedure: dental implants/alvoloplasty Date of procedure: TBD  CHADS2-VASc score of 4 (HTN, AGE, DM2, female)  CrCl 75.4 Platelet count 356  Per office protocol, patient can hold Eliquis for 2 days prior to procedure.    Patient should restart Eliquis on the evening of procedure or day after, at discretion of procedure MD

## 2019-10-13 NOTE — Telephone Encounter (Signed)
   [facility] Pre-operative Risk Assessment    Request for surgical clearance:  1. What type of surgery is being performed?  2 IMPLANT PLACEMENTS, 2 ALVOLOPLASTY    2. When is this surgery scheduled?  TBD   3. What type of clearance is required (medical clearance vs. Pharmacy clearance to hold med vs. Both)?  BOTH  4. Are there any medications that need to be held prior to surgery and how long? ELIQUIS   5. Practice name and name of physician performing surgery?  [facility] / DR. Owens Shark   6. What is your office phone number [phone]    7.   What is your office fax number [phone]  8.   Anesthesia type (None, local, MAC, general) ?  LOCAL   April Munoz 10/13/2019, 2:51 PM  _________________________________________________________________   (provider comments below)

## 2019-10-13 NOTE — Telephone Encounter (Signed)
Pharm can you address eliqis thanks.

## 2019-11-11 ENCOUNTER — Other Ambulatory Visit: Payer: Self-pay

## 2019-11-11 MED ORDER — LEVOTHYROXINE SODIUM 50 MCG PO TABS: 50.0000 ug | ORAL_TABLET | Freq: Every day | ORAL | 0 refills | Status: DC

## 2019-11-12 DIAGNOSIS — K2270 Barrett's esophagus without dysplasia: Secondary | ICD-10-CM | POA: Diagnosis not present

## 2019-11-12 DIAGNOSIS — K5900 Constipation, unspecified: Secondary | ICD-10-CM | POA: Diagnosis not present

## 2019-11-12 DIAGNOSIS — K219 Gastro-esophageal reflux disease without esophagitis: Secondary | ICD-10-CM | POA: Diagnosis not present

## 2019-11-17 DIAGNOSIS — E1122 Type 2 diabetes mellitus with diabetic chronic kidney disease: Secondary | ICD-10-CM | POA: Diagnosis not present

## 2019-11-17 DIAGNOSIS — N182 Chronic kidney disease, stage 2 (mild): Secondary | ICD-10-CM | POA: Diagnosis not present

## 2019-11-17 DIAGNOSIS — E559 Vitamin D deficiency, unspecified: Secondary | ICD-10-CM | POA: Diagnosis not present

## 2019-11-17 DIAGNOSIS — I129 Hypertensive chronic kidney disease with stage 1 through stage 4 chronic kidney disease, or unspecified chronic kidney disease: Secondary | ICD-10-CM | POA: Diagnosis not present

## 2019-11-17 DIAGNOSIS — R809 Proteinuria, unspecified: Secondary | ICD-10-CM | POA: Diagnosis not present

## 2019-11-20 ENCOUNTER — Other Ambulatory Visit: Payer: Self-pay | Admitting: Nephrology

## 2019-11-20 DIAGNOSIS — N182 Chronic kidney disease, stage 2 (mild): Secondary | ICD-10-CM

## 2019-11-25 ENCOUNTER — Encounter: Payer: Self-pay | Admitting: Family Medicine

## 2019-11-25 DIAGNOSIS — Z1231 Encounter for screening mammogram for malignant neoplasm of breast: Secondary | ICD-10-CM | POA: Diagnosis not present

## 2019-11-26 ENCOUNTER — Other Ambulatory Visit: Payer: Self-pay | Admitting: Family Medicine

## 2019-12-02 ENCOUNTER — Ambulatory Visit
Admission: RE | Admit: 2019-12-02 | Discharge: 2019-12-02 | Disposition: A | Discharge status: Home / Self Care | Payer: Medicare Other | Source: Ambulatory Visit | Attending: Nephrology | Admitting: Nephrology

## 2019-12-02 DIAGNOSIS — N182 Chronic kidney disease, stage 2 (mild): Secondary | ICD-10-CM | POA: Diagnosis not present

## 2019-12-02 DIAGNOSIS — N281 Cyst of kidney, acquired: Secondary | ICD-10-CM | POA: Diagnosis not present

## 2019-12-11 ENCOUNTER — Ambulatory Visit (INDEPENDENT_AMBULATORY_CARE_PROVIDER_SITE_OTHER): Payer: Medicare Other

## 2019-12-11 ENCOUNTER — Other Ambulatory Visit: Payer: Self-pay

## 2019-12-11 VITALS — BP 124/72 | HR 80 | Temp 97.6°F | Resp 16 | Ht 68.0 in | Wt 197.0 lb

## 2019-12-11 DIAGNOSIS — Z0000 Encounter for general adult medical examination without abnormal findings: Secondary | ICD-10-CM

## 2019-12-12 NOTE — Progress Notes (Signed)
Subjective:   April Munoz is a 69 y.o. female who presents for Medicare Annual (Subsequent) preventive examination.  This wellness visit is conducted by a nurse.  The patient's medications were reviewed and reconciled since the patient's last visit.  History details were provided by the patient.  The history appears to be reliable.    Patient's last AWV was > one year ago.   Medical History: Patient history and Family history was reviewed  Medications, Allergies, and preventative health maintenance was reviewed and updated.  Review of Systems:  Review of Systems  Constitutional: Negative.   HENT: Negative.   Eyes: Negative.   Respiratory: Negative.  Negative for cough, chest tightness and shortness of breath.   Cardiovascular: Negative.  Negative for chest pain and palpitations.  Gastrointestinal: Negative.   Genitourinary: Negative.   Musculoskeletal: Negative.   Neurological: Negative.  Negative for dizziness and headaches.  Psychiatric/Behavioral: Negative.  Negative for behavioral problems and suicidal ideas.   Cardiac Risk Factors include: advanced age (>70men, >22 women);diabetes mellitus     Objective:     Vitals: BP 124/72 (BP Location: Left Arm, Patient Position: Sitting, Cuff Size: Large)   Pulse 80   Temp 97.6 F (36.4 C) (Temporal)   Resp 16   Ht 5\' 8"  (1.727 m)   Wt 197 lb (89.4 kg)   SpO2 97%   BMI 29.95 kg/m   Body mass index is 29.95 kg/m.  Advanced Directives 12/11/2019 07/30/2019 03/21/2019 11/06/2013  Does Patient Have a Medical Advance Directive? Yes Yes Yes Patient has advance directive, copy not in chart  Type of Advance Directive [place];Living will [place];Living will [place];Living will Living will  Does patient want to make changes to medical advance directive? No - Patient declined No - Patient declined - No change requested  Copy of Rachel in Chart? No - copy  requested No - copy requested - -    Tobacco Social History   Tobacco Use  Smoking Status Never Smoker  Smokeless Tobacco Never Used      Clinical Intake:  Pre-visit preparation completed: Yes  Pain : No/denies pain Pain Score: 0-No pain     BMI - recorded: 29.95 Nutritional Status: BMI 25 -29 Overweight Nutritional Risks: None Diabetes: Yes CBG done?: No Did pt. bring in CBG monitor from home?: No  How often do you need to have someone help you when you read instructions, pamphlets, or other written materials from your doctor or pharmacy?: 3 - Sometimes  Interpreter Needed?: No     Past Medical History:  Diagnosis Date  . Anxiety   . Arthritis   . Ataxic gait   . Atrial fibrillation ([place])   . Atrophy of thyroid   . B12 deficiency 10/31/2017  . Cellulitis of abdominal wall 07/07/2016  . Depression   . Diabetes mellitus without complication ([place])   . Esophageal stricture 06/05/2017  . Essential hypertension 02/24/2013  . Fibromyalgia 02/24/2013  . Full dentures   . Hereditary and idiopathic peripheral neuropathy 02/24/2013  . Hyperlipidemia 02/24/2013  . Hypertension   . Incontinence   . Insomnia   . Murmur, cardiac 08/15/2015  . Neuropathy in diabetes ([place]) 10/31/2017  . Osteoporosis   . Other amnesia   . Other amnesia   . 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   . Paroxysmal atrial fibrillation ([place]) 08/15/2015   CHADS2vasc=3 CHADS2vasc=3  . Primary insomnia   . Restless  legs syndrome 03/14/2013  . Thoracic or lumbosacral neuritis or radiculitis 02/24/2013  . Type 2 diabetes mellitus, without long-term current use of insulin ([place]) 02/24/2013  . Wears glasses    Past Surgical History:  Procedure Laterality Date  . A-FLUTTER ABLATION N/A 07/30/2019   Procedure: A-FLUTTER ABLATION;  Surgeon: Constance Haw, MD;  Location: [facility];  Service: Cardiovascular;  Laterality: N/A;  . ATRIAL FIBRILLATION ABLATION N/A 03/21/2019    Procedure: ATRIAL FIBRILLATION ABLATION;  Surgeon: Constance Haw, MD;  Location: [facility];  Service: Cardiovascular;  Laterality: N/A;  . BREAST REDUCTION SURGERY  1983  . CATARACT EXTRACTION, BILATERAL  2018, 2019  . CHOLECYSTECTOMY  1971   open  . DIAGNOSTIC LAPAROSCOPY  2010   lysis of adhesions  . DILATION AND CURETTAGE OF UTERUS  2004  . KNEE ARTHROSCOPY  1998   left  . KNEE ARTHROSCOPY W/ LATERAL RETINACULAR REPAIR    . MASS EXCISION Left 11/11/2013   Procedure: EXCISION MUCOID CYST LEFT INDEX FINGER/DEBRIDEMENT LEFT INDEX FINGER;  Surgeon: Wynonia Sours, MD;  Location: [facility];  Service: Orthopedics;  Laterality: Left;  ANESTHESIA: IV REGIONAL/FAB  . SHOULDER ARTHROSCOPY W/ ROTATOR CUFF REPAIR  2007   left  . TRIGGER FINGER RELEASE Left 11/11/2013   Procedure: RELEASE A-1 PULLEY LEFT RING FINGER;  Surgeon: Wynonia Sours, MD;  Location: [facility];  Service: Orthopedics;  Laterality: Left;  . UMBILICAL HERNIA REPAIR  2008, 2010   Family History  Problem Relation Age of Onset  . Heart failure Father   . Heart disease Brother   . Heart attack Brother   . Heart failure Paternal Grandfather   . Stroke Paternal Grandfather   . Atrial fibrillation Mother   . Heart attack Mother   . Diabetes Maternal Grandmother    Social History   Socioeconomic History  . Marital status: Married    Spouse name: Liliane Channel  Occupational History  . Occupation: retired  Tobacco Use  . Smoking status: Never Smoker  . Smokeless tobacco: Never Used  Substance and Sexual Activity  . Alcohol use: Yes    Alcohol/week: 1.0 - 2.0 standard drinks    Types: 1 - 2 Glasses of wine per week    Comment: 4 times a week  . Drug use: No  . Sexual activity: Not on file    Outpatient Encounter Medications as of 12/11/2019  Medication Sig  . acyclovir (ZOVIRAX) 800 MG tablet Take 800 mg by mouth 2 (two) times daily as needed (shingles flare).   Marland Kitchen apixaban  (ELIQUIS) 5 MG TABS tablet Take 1 tablet (5 mg total) by mouth 2 (two) times daily.  . B Complex-C-Folic Acid TABS Take 1 tablet by mouth daily.  . Biotin 1000 MCG tablet Take 1,000 mcg by mouth daily.  . Calcium-Vitamin D-Vitamin K (VIACTIV CALCIUM PLUS D) 650-12.5-40 MG-MCG-MCG CHEW Chew 1 capsule by mouth daily.  . cyanocobalamin (,VITAMIN B-12,) 1000 MCG/ML injection Inject 1,000 mcg into the muscle every 30 (thirty) days.  Marland Kitchen dexamethasone (DECADRON) 4 MG tablet   . diazepam (VALIUM) 5 MG tablet Take 5 mg by mouth 2 (two) times daily as needed.  . IBU 800 MG tablet   . ibuprofen (ADVIL) 200 MG tablet Take 400 mg by mouth every 6 (six) hours as needed for headache or mild pain.   Marland Kitchen levothyroxine (SYNTHROID) 50 MCG tablet Take 1 tablet (50 mcg total) by mouth daily before breakfast.  .  losartan (COZAAR) 50 MG tablet Take 1 tablet (50 mg total) by mouth daily.  . metFORMIN (GLUCOPHAGE) 1000 MG tablet Take 1 tablet (1,000 mg total) by mouth 2 (two) times daily with a meal.  . metoprolol succinate (TOPROL XL) 25 MG 24 hr tablet Take 1 tablet (25 mg total) by mouth in the morning and at bedtime.  . Multiple Vitamin (MULTIVITAMIN WITH MINERALS) TABS tablet Take 1 tablet by mouth daily.  Marland Kitchen omega-3 acid ethyl esters (LOVAZA) 1 g capsule Take 1 capsule (1 g total) by mouth 2 (two) times daily.  Marland Kitchen omeprazole (PRILOSEC) 20 MG capsule TAKE ONE (1) CAPSULE EACH DAY  . pravastatin (PRAVACHOL) 20 MG tablet Take 20 mg by mouth 4 (four) times a week. Sun, Tue, Thu, Sat  . Probiotic Product (PROBIOTIC & ACIDOPHILUS EX ST PO) Take 1 tablet by mouth daily.   Marland Kitchen torsemide (DEMADEX) 20 MG tablet Take 1 tablet (20 mg total) by mouth daily as needed (swelling).  . venlafaxine XR (EFFEXOR-XR) 75 MG 24 hr capsule Take 1 capsule (75 mg total) by mouth daily.  . Vitamin D, Ergocalciferol, (DRISDOL) 1.25 MG (50000 UNIT) CAPS capsule Take 1 capsule (50,000 Units total) by mouth every Monday.  . zolpidem (AMBIEN) 5 MG  tablet Take 1 tablet (5 mg total) by mouth at bedtime as needed.   No facility-administered encounter medications on file as of 12/11/2019.    Activities of Daily Living In your present state of health, do you have any difficulty performing the following activities: 12/11/2019  Hearing? N  Vision? N  Difficulty concentrating or making decisions? Y  Walking or climbing stairs? N  Dressing or bathing? N  Doing errands, shopping? N  Preparing Food and eating ? N  Using the Toilet? N  In the past six months, have you accidently leaked urine? N  Do you have problems with loss of bowel control? N  Managing your Medications? N  Managing your Finances? N  Housekeeping or managing your Housekeeping? N  Some recent data might be hidden    Patient Care Team: [name], MD as PCP - General (Family Medicine) Richardo Priest, MD as PCP - Cardiology (Cardiology) Constance Haw, MD as PCP - Electrophysiology (Cardiology) Richardo Priest, MD as Consulting Physician (Cardiology) Misenheimer, Christia Reading, MD as Consulting Physician (Unknown Physician Specialty)    Assessment:   This is a routine wellness examination for Cameron.  Exercise Activities and Dietary recommendations Current Exercise Habits: Home exercise routine, Type of exercise: walking;Other - see comments(water aerobics), Time (Minutes): 20, Frequency (Times/Week): 3, Weekly Exercise (Minutes/Week): 60, Intensity: Mild, Exercise limited by: None identified   Fall Risk Fall Risk  12/11/2019  Falls in the past year? 1  Number falls in past yr: 0  Injury with Fall? 1  Comment bruised hip, glasses cut above eye  Risk for fall due to : History of fall(s)  Follow up Falls evaluation completed;Falls prevention discussed   Is the patient's home free of loose throw rugs in walkways, pet beds, electrical cords, etc?   no      Grab bars in the bathroom? no      Handrails on the stairs?   yes      Adequate lighting?   yes    Depression Screen PHQ 2/9 Scores 12/11/2019 09/18/2019 09/16/2019  PHQ - 2 Score 2 2 2   PHQ- 9 Score 11 15 15      Cognitive Function     6CIT Screen 12/11/2019  What  Year? 0 points  What month? 0 points  What time? 0 points  Count back from 20 2 points  Months in reverse 2 points  Repeat phrase 6 points  Total Score 10    Immunization History  Administered Date(s) Administered  . Fluad Quad(high Dose 65+) 03/18/2019  . Influenza-Unspecified 03/18/2019  . Moderna SARS-COVID-2 Vaccination 09/09/2019, 10/04/2019  . Pneumococcal Conjugate-13 05/14/2015  . Pneumococcal Polysaccharide-23 05/16/2013, 07/27/2017    Screening Tests Health Maintenance  Topic Date Due  . Hepatitis C Screening  Never done  . FOOT EXAM  Never done  . TETANUS/TDAP  Never done  . INFLUENZA VACCINE  02/15/2020  . HEMOGLOBIN A1C  03/20/2020  . OPHTHALMOLOGY EXAM  07/17/2020  . DEXA SCAN  07/18/2020  . MAMMOGRAM  09/28/2020  . COLONOSCOPY  12/01/2020  . COVID-19 Vaccine  Completed  . PNA vac Low Risk Adult  Completed    Cancer Screenings: Lung: Low Dose CT Chest recommended if Age 44-80 years, 30 pack-year currently smoking OR have quit w/in 15years. Patient does not qualify. Breast:  Up to date on Mammogram? Yes   Up to date of Bone Density/Dexa? Yes Colorectal: Colonoscopy 2012      Plan:    Counseling was provided today regarding the following topics: healthy eating habits, home safety, vitamin and mineral supplementation (calcium and Vit D), regular exercise, breast self-exam, tobacco avoidance, limitation of alcohol intake, use of seat belts, firearm safety, and fall prevention.  Annual recommendations include: influenza vaccine, dental cleanings, and eye exams.  Heli agreed to bring a copy of her Advance Directive for our files  I have personally reviewed and noted the following in the patient's chart:   . Medical and social history . Use of alcohol, tobacco or illicit drugs  . Current  medications and supplements . Functional ability and status . Nutritional status . Physical activity . Advanced directives . List of other physicians . Hospitalizations, surgeries, and ER visits in previous 12 months . Vitals . Screenings to include cognitive, depression, and falls . Referrals and appointments  In addition, I have reviewed and discussed with patient certain preventive protocols, quality metrics, and best practice recommendations. A written personalized care plan for preventive services as well as general preventive health recommendations were provided to patient.     Erie Noe, LPN  [id number]

## 2019-12-12 NOTE — Patient Instructions (Addendum)
 Fall Prevention in the Home, Adult Falls can cause injuries. They can happen to people of all ages. There are many things you can do to make your home safe and to help prevent falls. Ask for help when making these changes, if needed. What actions can I take to prevent falls? General Instructions  Use good lighting in all rooms. Replace any light bulbs that burn out.  Turn on the lights when you go into a dark area. Use night-lights.  Keep items that you use often in easy-to-reach places. Lower the shelves around your home if necessary.  Set up your furniture so you have a clear path. Avoid moving your furniture around.  Do not have throw rugs and other things on the floor that can make you trip.  Avoid walking on wet floors.  If any of your floors are uneven, fix them.  Add color or contrast paint or tape to clearly mark and help you see: ? Any grab bars or handrails. ? First and last steps of stairways. ? Where the edge of each step is.  If you use a stepladder: ? Make sure that it is fully opened. Do not climb a closed stepladder. ? Make sure that both sides of the stepladder are locked into place. ? Ask someone to hold the stepladder for you while you use it.  If there are any pets around you, be aware of where they are. What can I do in the bathroom?      Keep the floor dry. Clean up any water that spills onto the floor as soon as it happens.  Remove soap buildup in the tub or shower regularly.  Use non-skid mats or decals on the floor of the tub or shower.  Attach bath mats securely with double-sided, non-slip rug tape.  If you need to sit down in the shower, use a plastic, non-slip stool.  Install grab bars by the toilet and in the tub and shower. Do not use towel bars as grab bars. What can I do in the bedroom?  Make sure that you have a light by your bed that is easy to reach.  Do not use any sheets or blankets that are too big for your bed. They should  not hang down onto the floor.  Have a firm chair that has side arms. You can use this for support while you get dressed. What can I do in the kitchen?  Clean up any spills right away.  If you need to reach something above you, use a strong step stool that has a grab bar.  Keep electrical cords out of the way.  Do not use floor polish or wax that makes floors slippery. If you must use wax, use non-skid floor wax. What can I do with my stairs?  Do not leave any items on the stairs.  Make sure that you have a light switch at the top of the stairs and the bottom of the stairs. If you do not have them, ask someone to add them for you.  Make sure that there are handrails on both sides of the stairs, and use them. Fix handrails that are broken or loose. Make sure that handrails are as long as the stairways.  Install non-slip stair treads on all stairs in your home.  Avoid having throw rugs at the top or bottom of the stairs. If you do have throw rugs, attach them to the floor with carpet tape.  Choose a carpet that   does not hide the edge of the steps on the stairway.  Check any carpeting to make sure that it is firmly attached to the stairs. Fix any carpet that is loose or worn. What can I do on the outside of my home?  Use bright outdoor lighting.  Regularly fix the edges of walkways and driveways and fix any cracks.  Remove anything that might make you trip as you walk through a door, such as a raised step or threshold.  Trim any bushes or trees on the path to your home.  Regularly check to see if handrails are loose or broken. Make sure that both sides of any steps have handrails.  Install guardrails along the edges of any raised decks and porches.  Clear walking paths of anything that might make someone trip, such as tools or rocks.  Have any leaves, snow, or ice cleared regularly.  Use sand or salt on walking paths during winter.  Clean up any spills in your garage right  away. This includes grease or oil spills. What other actions can I take?  Wear shoes that: ? Have a low heel. Do not wear high heels. ? Have rubber bottoms. ? Are comfortable and fit you well. ? Are closed at the toe. Do not wear open-toe sandals.  Use tools that help you move around (mobility aids) if they are needed. These include: ? Canes. ? Walkers. ? Scooters. ? Crutches.  Review your medicines with your doctor. Some medicines can make you feel dizzy. This can increase your chance of falling. Ask your doctor what other things you can do to help prevent falls. Where to find more information  Centers for Disease Control and Prevention, [employer]: https://cdc.gov  National Institute on Aging: https://go4life.nia.nih.gov Contact a doctor if:  You are afraid of falling at home.  You feel weak, drowsy, or dizzy at home.  You fall at home. Summary  There are many simple things that you can do to make your home safe and to help prevent falls.  Ways to make your home safe include removing tripping hazards and installing grab bars in the bathroom.  Ask for help when making these changes in your home. This information is not intended to replace advice given to you by your health care provider. Make sure you discuss any questions you have with your health care provider. Document Revised: 10/24/2018 Document Reviewed: 02/15/2017 Elsevier Patient Education  2020 [employer]   Health Maintenance, Female Adopting a healthy lifestyle and getting preventive care are important in promoting health and wellness. Ask your health care provider about:  The right schedule for you to have regular tests and exams.  Things you can do on your own to prevent diseases and keep yourself healthy. What should I know about diet, weight, and exercise? Eat a healthy diet   Eat a diet that includes plenty of vegetables, fruits, low-fat dairy products, and lean protein.  Do not eat a lot of foods  that are high in solid fats, added sugars, or sodium. Maintain a healthy weight Body mass index (BMI) is used to identify weight problems. It estimates body fat based on height and weight. Your health care provider can help determine your BMI and help you achieve or maintain a healthy weight. Get regular exercise Get regular exercise. This is one of the most important things you can do for your health. Most adults should:  Exercise for at least 150 minutes each week. The exercise should increase your heart rate   and make you sweat (moderate-intensity exercise).  Do strengthening exercises at least twice a week. This is in addition to the moderate-intensity exercise.  Spend less time sitting. Even light physical activity can be beneficial. Watch cholesterol and blood lipids Have your blood tested for lipids and cholesterol at 71 years of age, then have this test every 5 years. Have your cholesterol levels checked more often if:  Your lipid or cholesterol levels are high.  You are older than 71 years of age.  You are at high risk for heart disease. What should I know about cancer screening? Depending on your health history and family history, you may need to have cancer screening at various ages. This may include screening for:  Breast cancer.  Cervical cancer.  Colorectal cancer.  Skin cancer.  Lung cancer. What should I know about heart disease, diabetes, and high blood pressure? Blood pressure and heart disease  High blood pressure causes heart disease and increases the risk of stroke. This is more likely to develop in people who have high blood pressure readings, are of African descent, or are overweight.  Have your blood pressure checked: ? Every 3-5 years if you are 18-39 years of age. ? Every year if you are 40 years old or older. Diabetes Have regular diabetes screenings. This checks your fasting blood sugar level. Have the screening done:  Once every three years after  age 40 if you are at a normal weight and have a low risk for diabetes.  More often and at a younger age if you are overweight or have a high risk for diabetes. What should I know about preventing infection? Hepatitis B If you have a higher risk for hepatitis B, you should be screened for this virus. Talk with your health care provider to find out if you are at risk for hepatitis B infection. Hepatitis C Testing is recommended for:  Everyone born from 1945 through 1965.  Anyone with known risk factors for hepatitis C. Sexually transmitted infections (STIs)  Get screened for STIs, including gonorrhea and chlamydia, if: ? You are sexually active and are younger than 71 years of age. ? You are older than 71 years of age and your health care provider tells you that you are at risk for this type of infection. ? Your sexual activity has changed since you were last screened, and you are at increased risk for chlamydia or gonorrhea. Ask your health care provider if you are at risk.  Ask your health care provider about whether you are at high risk for HIV. Your health care provider may recommend a prescription medicine to help prevent HIV infection. If you choose to take medicine to prevent HIV, you should first get tested for HIV. You should then be tested every 3 months for as long as you are taking the medicine. Pregnancy  If you are about to stop having your period (premenopausal) and you may become pregnant, seek counseling before you get pregnant.  Take 400 to 800 micrograms (mcg) of folic acid every day if you become pregnant.  Ask for birth control (contraception) if you want to prevent pregnancy. Osteoporosis and menopause Osteoporosis is a disease in which the bones lose minerals and strength with aging. This can result in bone fractures. If you are 65 years old or older, or if you are at risk for osteoporosis and fractures, ask your health care provider if you should:  Be screened for  bone loss.  Take a calcium or vitamin   D supplement to lower your risk of fractures.  Be given hormone replacement therapy (HRT) to treat symptoms of menopause. Follow these instructions at home: Lifestyle  Do not use any products that contain nicotine or tobacco, such as cigarettes, e-cigarettes, and chewing tobacco. If you need help quitting, ask your health care provider.  Do not use street drugs.  Do not share needles.  Ask your health care provider for help if you need support or information about quitting drugs. Alcohol use  Do not drink alcohol if: ? Your health care provider tells you not to drink. ? You are pregnant, may be pregnant, or are planning to become pregnant.  If you drink alcohol: ? Limit how much you use to 0-1 drink a day. ? Limit intake if you are breastfeeding.  Be aware of how much alcohol is in your drink. In the [place] one drink equals one 12 oz bottle of beer (355 mL), one 5 oz glass of wine (148 mL), or one 1 oz glass of hard liquor (44 mL). General instructions  Schedule regular health, dental, and eye exams.  Stay current with your vaccines.  Tell your health care provider if: ? You often feel depressed. ? You have ever been abused or do not feel safe at home. Summary  Adopting a healthy lifestyle and getting preventive care are important in promoting health and wellness.  Follow your health care provider's instructions about healthy diet, exercising, and getting tested or screened for diseases.  Follow your health care provider's instructions on monitoring your cholesterol and blood pressure. This information is not intended to replace advice given to you by your health care provider. Make sure you discuss any questions you have with your health care provider. Document Revised: 06/26/2018 Document Reviewed: 06/26/2018 Elsevier Patient Education  2020 [place].   April Munoz , Thank you for taking time to come for your Medicare Wellness  Visit. I appreciate your ongoing commitment to your health goals. Please review the following plan we discussed and let me know if I can assist you in the future.   This is a list of the screening recommended for you and due dates:  Health Maintenance  Topic Date Due  . Complete foot exam (at each visit with provider) Never done  . Tetanus Vaccine  Never done  . Flu Shot  02/15/2020  . Hemoglobin A1C  03/20/2020  . Eye exam for diabetics  07/17/2020  . DEXA scan (bone density measurement)  07/18/2020  . Mammogram  09/28/2020  . Colon Cancer Screening  12/01/2020  . COVID-19 Vaccine  Completed  . Pneumonia vaccines  Completed

## 2019-12-19 DIAGNOSIS — E119 Type 2 diabetes mellitus without complications: Secondary | ICD-10-CM | POA: Diagnosis not present

## 2019-12-25 ENCOUNTER — Other Ambulatory Visit: Payer: Self-pay | Admitting: Pharmacist Clinician (PhC)/ Clinical Pharmacy Specialist

## 2019-12-25 ENCOUNTER — Other Ambulatory Visit: Payer: Self-pay | Admitting: Family Medicine

## 2019-12-25 ENCOUNTER — Other Ambulatory Visit: Payer: Self-pay

## 2019-12-25 MED ORDER — APIXABAN 5 MG PO TABS: 5.0000 mg | ORAL_TABLET | Freq: Two times a day (BID) | ORAL | 1 refills | Status: DC

## 2019-12-25 NOTE — Telephone Encounter (Signed)
68 F 89.4 kg SCr 0.99 (3/21); LOV 2/21 [place]

## 2019-12-30 ENCOUNTER — Telehealth: Payer: Self-pay | Admitting: Cardiology

## 2019-12-30 NOTE — Telephone Encounter (Signed)
Spoke with the patient just now and got her scheduled to see Dr. Harriet Masson on 01/20/20. She verbalizes understanding and thanks me for the call back.    Encouraged patient to call back with any questions or concerns.

## 2019-12-30 NOTE — Telephone Encounter (Signed)
April Munoz is calling requesting an appointment with Dr. Bettina Gavia. I advised [name] Dr. Bettina Gavia does not have anything available before 02/11/20 in [place]. [name] then stated Dr. Bettina Gavia advised her she can be scheduled with Dr. Harriet Masson if he is unavailable when scheduling. I do not have the authority to schedule with Dr. Harriet Masson for a [facility] patient. Please advise.

## 2020-01-12 ENCOUNTER — Other Ambulatory Visit: Payer: Self-pay | Admitting: Family Medicine

## 2020-01-13 ENCOUNTER — Other Ambulatory Visit: Payer: Self-pay | Admitting: Family Medicine

## 2020-01-16 ENCOUNTER — Ambulatory Visit: Payer: Medicare Other | Admitting: Family Medicine

## 2020-01-20 ENCOUNTER — Other Ambulatory Visit: Payer: Self-pay

## 2020-01-20 ENCOUNTER — Ambulatory Visit (INDEPENDENT_AMBULATORY_CARE_PROVIDER_SITE_OTHER): Payer: Medicare Other

## 2020-01-20 ENCOUNTER — Ambulatory Visit (INDEPENDENT_AMBULATORY_CARE_PROVIDER_SITE_OTHER): Payer: Medicare Other | Admitting: Cardiology

## 2020-01-20 ENCOUNTER — Encounter: Payer: Self-pay | Admitting: Cardiology

## 2020-01-20 VITALS — BP 134/82 | HR 80 | Ht 68.0 in | Wt 195.0 lb

## 2020-01-20 DIAGNOSIS — I480 Paroxysmal atrial fibrillation: Secondary | ICD-10-CM | POA: Diagnosis not present

## 2020-01-20 DIAGNOSIS — I110 Hypertensive heart disease with heart failure: Secondary | ICD-10-CM | POA: Diagnosis not present

## 2020-01-20 DIAGNOSIS — E782 Mixed hyperlipidemia: Secondary | ICD-10-CM | POA: Diagnosis not present

## 2020-01-20 DIAGNOSIS — R5383 Other fatigue: Secondary | ICD-10-CM

## 2020-01-20 DIAGNOSIS — E1100 Type 2 diabetes mellitus with hyperosmolarity without nonketotic hyperglycemic-hyperosmolar coma (NKHHC): Secondary | ICD-10-CM

## 2020-01-20 DIAGNOSIS — E559 Vitamin D deficiency, unspecified: Secondary | ICD-10-CM

## 2020-01-20 HISTORY — DX: Other fatigue: R53.83

## 2020-01-20 NOTE — Progress Notes (Signed)
Cardiology Office Note:    Date:  01/20/2020   ID:  April Munoz, DOB 20-Apr-1951, MRN [id number]  PCP:  [name], MD  Cardiologist:  Shirlee More, MD  Electrophysiologist:  Constance Haw, MD   Referring MD: [name], MD   " I am feeling really tired lately just like I felt before when I was in A. Fib."  History of Present Illness:    April Munoz is a 69 y.o. female with a hx of atrial fibrillation status post A. fib ablation on March 21, 2019 as well as atrial flutter ablation on July 30, 2019, depression, diabetes mellitus, hypertension, hyperlipidemia, and TIA.  The patient usually follows with Dr. Bettina Gavia and was last seen by Dr. Bettina Gavia in February 2021.  She also did see Dr. Curt Bears (EP ) in February as well.  The patient is here for follow-up visit today.  The patient tells me that she has been experiencing significant fatigue.  She notes that it reminds her of how she felt when she was in A. fib in the past.  She has no interest to do anything.  She denies chest pain, shortness of breath or lightheadedness.  Past Medical History:  Diagnosis Date  . Anxiety   . Arthritis   . Ataxic gait   . Atrial fibrillation ([place])   . Atrophy of thyroid   . B12 deficiency 10/31/2017  . Cellulitis of abdominal wall 07/07/2016  . Depression   . Diabetes mellitus without complication ([place])   . Esophageal stricture 06/05/2017  . Essential hypertension 02/24/2013  . Fibromyalgia 02/24/2013  . Full dentures   . Hereditary and idiopathic peripheral neuropathy 02/24/2013  . Hyperlipidemia 02/24/2013  . Hypertension   . Incontinence   . Insomnia   . Murmur, cardiac 08/15/2015  . Neuropathy in diabetes ([place]) 10/31/2017  . Osteoporosis   . Other amnesia   . Other amnesia   . 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   . Paroxysmal atrial fibrillation ([place]) 08/15/2015   CHADS2vasc=3 CHADS2vasc=3  . Primary insomnia   . Restless legs syndrome 03/14/2013  .  Thoracic or lumbosacral neuritis or radiculitis 02/24/2013  . Type 2 diabetes mellitus, without long-term current use of insulin ([place]) 02/24/2013  . Wears glasses     Past Surgical History:  Procedure Laterality Date  . A-FLUTTER ABLATION N/A 07/30/2019   Procedure: A-FLUTTER ABLATION;  Surgeon: Constance Haw, MD;  Location: [facility];  Service: Cardiovascular;  Laterality: N/A;  . ATRIAL FIBRILLATION ABLATION N/A 03/21/2019   Procedure: ATRIAL FIBRILLATION ABLATION;  Surgeon: Constance Haw, MD;  Location: [facility];  Service: Cardiovascular;  Laterality: N/A;  . BREAST REDUCTION SURGERY  1983  . CATARACT EXTRACTION, BILATERAL  2018, 2019  . CHOLECYSTECTOMY  1971   open  . DIAGNOSTIC LAPAROSCOPY  2010   lysis of adhesions  . DILATION AND CURETTAGE OF UTERUS  2004  . KNEE ARTHROSCOPY  1998   left  . KNEE ARTHROSCOPY W/ LATERAL RETINACULAR REPAIR    . MASS EXCISION Left 11/11/2013   Procedure: EXCISION MUCOID CYST LEFT INDEX FINGER/DEBRIDEMENT LEFT INDEX FINGER;  Surgeon: Wynonia Sours, MD;  Location: [facility];  Service: Orthopedics;  Laterality: Left;  ANESTHESIA: IV REGIONAL/FAB  . SHOULDER ARTHROSCOPY W/ ROTATOR CUFF REPAIR  2007   left  . TRIGGER FINGER RELEASE Left 11/11/2013   Procedure: RELEASE A-1 PULLEY LEFT RING FINGER;  Surgeon: Wynonia Sours, MD;  Location: [facility];  Service: Orthopedics;  Laterality: Left;  . UMBILICAL HERNIA REPAIR  2008, 2010    Current Medications: Current Meds  Medication Sig  . acyclovir (ZOVIRAX) 800 MG tablet TAKE ONE (1) TABLET FOUR (4) TIMES DAILYAS NEEDED FOR SHINGLES  . apixaban (ELIQUIS) 5 MG TABS tablet Take 1 tablet (5 mg total) by mouth 2 (two) times daily.  . B Complex-C-Folic Acid TABS Take 1 tablet by mouth daily.  . Biotin 1000 MCG tablet Take 1,000 mcg by mouth daily.  . Calcium-Vitamin D-Vitamin K (VIACTIV CALCIUM PLUS D) 650-12.5-40 MG-MCG-MCG CHEW Chew 1 capsule by mouth  daily.  . cyanocobalamin (,VITAMIN B-12,) 1000 MCG/ML injection Inject 1,000 mcg into the muscle every 30 (thirty) days.  . diazepam (VALIUM) 5 MG tablet Take 5 mg by mouth 2 (two) times daily as needed.  Marland Kitchen ibuprofen (ADVIL) 200 MG tablet Take 400 mg by mouth every 6 (six) hours as needed for headache or mild pain.   Marland Kitchen levothyroxine (SYNTHROID) 50 MCG tablet Take 1 tablet (50 mcg total) by mouth daily before breakfast.  . losartan (COZAAR) 50 MG tablet TAKE ONE (1) TABLET BY MOUTH ONCE DAILY  . metFORMIN (GLUCOPHAGE) 1000 MG tablet Take 1 tablet (1,000 mg total) by mouth 2 (two) times daily with a meal.  . metoprolol succinate (TOPROL XL) 25 MG 24 hr tablet Take 1 tablet (25 mg total) by mouth in the morning and at bedtime.  . Multiple Vitamin (MULTIVITAMIN WITH MINERALS) TABS tablet Take 1 tablet by mouth daily.  Marland Kitchen omeprazole (PRILOSEC) 20 MG capsule TAKE ONE (1) CAPSULE EACH DAY  . pravastatin (PRAVACHOL) 20 MG tablet Take 20 mg by mouth 4 (four) times a week. Sun, Tue, Thu, Sat  . Probiotic Product (PROBIOTIC & ACIDOPHILUS EX ST PO) Take 1 tablet by mouth daily.   Marland Kitchen torsemide (DEMADEX) 20 MG tablet Take 1 tablet (20 mg total) by mouth daily as needed (swelling).  . venlafaxine XR (EFFEXOR-XR) 75 MG 24 hr capsule Take 1 capsule (75 mg total) by mouth daily.  . Vitamin D, Ergocalciferol, (DRISDOL) 1.25 MG (50000 UNIT) CAPS capsule Take 1 capsule (50,000 Units total) by mouth every Monday.  . zolpidem (AMBIEN) 5 MG tablet TAKE ONE TABLET BY MOUTH AT BEDTIME AS NEEDED.     Allergies:   Prednisone   Social History   Socioeconomic History  . Marital status: Married    Spouse name: Not on file  . Number of children: Not on file  . Years of education: Not on file  . Highest education level: Not on file  Occupational History  . Occupation: retired  Tobacco Use  . Smoking status: Never Smoker  . Smokeless tobacco: Never Used  Vaping Use  . Vaping Use: Never used  Substance and Sexual  Activity  . Alcohol use: Yes    Alcohol/week: 1.0 - 2.0 standard drink    Types: 1 - 2 Glasses of wine per week    Comment: 4 times a week  . Drug use: No  . Sexual activity: Not on file  Other Topics Concern  . Not on file  Social History Narrative  . Not on file   Social Determinants of Health   Financial Resource Strain:   . Difficulty of Paying Living Expenses:   Food Insecurity:   . Worried About Charity fundraiser in the Last Year:   . Arboriculturist in the Last Year:   Transportation Needs:   . Lack of Transportation (  Medical):   Marland Kitchen Lack of Transportation (Non-Medical):   Physical Activity:   . Days of Exercise per Week:   . Minutes of Exercise per Session:   Stress:   . Feeling of Stress :   Social Connections:   . Frequency of Communication with Friends and Family:   . Frequency of Social Gatherings with Friends and Family:   . Attends Religious Services:   . Active Member of Clubs or Organizations:   . Attends Archivist Meetings:   Marland Kitchen Marital Status:      Family History: The patient's family history includes Atrial fibrillation in her mother; Diabetes in her maternal grandmother; Heart attack in her brother and mother; Heart disease in her brother; Heart failure in her father and paternal grandfather; Stroke in her paternal grandfather.  ROS:   Review of Systems  Constitution: Reports fatigue, negative for decreased appetite, fever and weight gain.  HENT: Negative for congestion, ear discharge, hoarse voice and sore throat.   Eyes: Negative for discharge, redness, vision loss in right eye and visual halos.  Cardiovascular: Negative for chest pain, dyspnea on exertion, leg swelling, orthopnea and palpitations.  Respiratory: Negative for cough, hemoptysis, shortness of breath and snoring.   Endocrine: Negative for heat intolerance and polyphagia.  Hematologic/Lymphatic: Negative for bleeding problem. Does not bruise/bleed easily.  Skin: Negative for  flushing, nail changes, rash and suspicious lesions.  Musculoskeletal: Negative for arthritis, joint pain, muscle cramps, myalgias, neck pain and stiffness.  Gastrointestinal: Negative for abdominal pain, bowel incontinence, diarrhea and excessive appetite.  Genitourinary: Negative for decreased libido, genital sores and incomplete emptying.  Neurological: Negative for brief paralysis, focal weakness, headaches and loss of balance.  Psychiatric/Behavioral: Negative for altered mental status, depression and suicidal ideas.  Allergic/Immunologic: Negative for HIV exposure and persistent infections.    EKGs/Labs/Other Studies Reviewed:    The following studies were reviewed today:    EKG: None today  Recent Labs: 09/18/2019: Hemoglobin 12.2; Platelets 356 10/06/2019: ALT 21; BUN 16; Creatinine, Ser 0.99; Potassium 4.8; Sodium 143  Recent Lipid Panel    Component Value Date/Time   CHOL 183 09/18/2019 0942   TRIG 215 (H) 09/18/2019 0942   HDL 61 09/18/2019 0942   CHOLHDL 3.0 09/18/2019 0942   LDLCALC 86 09/18/2019 0942    Physical Exam:    VS:  BP 134/82   Pulse 80   Ht 5\' 8"  (1.727 m)   Wt 195 lb (88.5 kg)   SpO2 99%   BMI 29.65 kg/m     Wt Readings from Last 3 Encounters:  01/20/20 195 lb (88.5 kg)  12/11/19 197 lb (89.4 kg)  09/16/19 199 lb (90.3 kg)     GEN: Well nourished, well developed in no acute distress HEENT: Normal NECK: No JVD; No carotid bruits LYMPHATICS: No lymphadenopathy CARDIAC: S1S2 noted,RRR, no murmurs, rubs, gallops RESPIRATORY:  Clear to auscultation without rales, wheezing or rhonchi  ABDOMEN: Soft, non-tender, non-distended, +bowel sounds, no guarding. EXTREMITIES: No edema, No cyanosis, no clubbing MUSCULOSKELETAL:  No deformity  SKIN: Warm and dry NEUROLOGIC:  Alert and oriented x 3, non-focal PSYCHIATRIC:  Normal affect, good insight  ASSESSMENT:    1. Paroxysmal atrial fibrillation (HCC)   2. Hypertensive heart disease with  congestive heart failure, unspecified heart failure type ([place])   3. Type 2 diabetes mellitus with hyperosmolarity without coma, without long-term current use of insulin ([place])   4. Mixed hyperlipidemia   5. Fatigue, unspecified type   6. Vitamin D deficiency,  unspecified     PLAN:     With her symptoms I would like to rule out outbreaks of atrial fibrillation therefore I am going to place a monitor the patient for 7 days.  In addition we will reassess her LV function therefore we will get a echocardiogram.  BMP, mag, CBC, TSH  and vitamin D levels will also be done.  The patient also may be experiencing underlying depression, she is currently being treated with venlafaxine 75 mg daily by her PCP along with Valium.  She plans to discuss with her PCP about psychotherapy locally.  Her blood pressure is acceptable in the office today.  Cozaar was recently added to her antihypertensive regimen.  No changes will be made with her antihypertensive regimen.  In terms of her atrial fibrillation continue patient on her Toprol-XL 25 mg twice a day along with Eliquis 5 mg twice daily.  The patient is in agreement with the above plan. The patient left the office in stable condition.  The patient will follow up in 3 months with Dr. Bettina Gavia.   Medication Adjustments/Labs and Tests Ordered: Current medicines are reviewed at length with the patient today.  Concerns regarding medicines are outlined above.  Orders Placed This Encounter  Procedures  . Basic metabolic panel  . TSH  . VITAMIN D 25 Hydroxy (Vit-D Deficiency, Fractures)  . Magnesium  . CBC  . LONG TERM MONITOR (3-14 DAYS)  . ECHOCARDIOGRAM COMPLETE   No orders of the defined types were placed in this encounter.   Patient Instructions  Medication Instructions:  Your physician recommends that you continue on your current medications as directed. Please refer to the Current Medication list given to you today.  *If you need a refill on your  cardiac medications before your next appointment, please call your pharmacy*   Lab Work: Your physician recommends that you return for lab work in: TODAY BMP, CBC, TSH, Vitamin D, Mag If you have labs (blood work) drawn today and your tests are completely normal, you will receive your results only by: Marland Kitchen MyChart Message (if you have MyChart) OR . A paper copy in the mail If you have any lab test that is abnormal or we need to change your treatment, we will call you to review the results.   Testing/Procedures: Your physician has requested that you have an echocardiogram. Echocardiography is a painless test that uses sound waves to create images of your heart. It provides your doctor with information about the size and shape of your heart and how well your heart's chambers and valves are working. This procedure takes approximately one hour. There are no restrictions for this procedure.  A zio monitor was ordered today. It will remain on for 7 days. You will then return monitor and event diary in provided box. It takes 1-2 weeks for report to be downloaded and returned to [place]. We will call you with the results. If monitor falls off or has orange flashing light, please call Zio for further instructions.      Follow-Up: At [facility], you and your health needs are our priority.  As part of our continuing mission to provide you with exceptional heart care, we have created designated Provider Care Teams.  These Care Teams include your primary Cardiologist (physician) and Advanced Practice Providers (APPs -  Physician Assistants and Nurse Practitioners) who all work together to provide you with the care you need, when you need it.  We recommend signing up for the patient  portal called "MyChart".  Sign up information is provided on this After Visit Summary.  MyChart is used to connect with patients for Virtual Visits (Telemedicine).  Patients are able to view lab/test results, encounter notes,  upcoming appointments, etc.  Non-urgent messages can be sent to your provider as well.   To learn more about what you can do with MyChart, go to [email or web address].    Your next appointment:   3 month(s)  The format for your next appointment:   In Person  Provider:   Dr. Bettina Gavia   Other Instructions      Adopting a Healthy Lifestyle.  Know what a healthy weight is for you (roughly BMI <25) and aim to maintain this   Aim for 7+ servings of fruits and vegetables daily   65-80+ fluid ounces of water or unsweet tea for healthy kidneys   Limit to max 1 drink of alcohol per day; avoid smoking/tobacco   Limit animal fats in diet for cholesterol and heart health - choose grass fed whenever available   Avoid highly processed foods, and foods high in saturated/trans fats   Aim for low stress - take time to unwind and care for your mental health   Aim for 150 min of moderate intensity exercise weekly for heart health, and weights twice weekly for bone health   Aim for 7-9 hours of sleep daily   When it comes to diets, agreement about the perfect plan isnt easy to find, even among the experts. Experts at the [employer] developed an idea known as the Healthy Eating Plate. Just imagine a plate divided into logical, healthy portions.   The emphasis is on diet quality:   Load up on vegetables and fruits - one-half of your plate: Aim for color and variety, and remember that potatoes dont count.   Go for whole grains - one-quarter of your plate: Whole wheat, barley, wheat berries, quinoa, oats, brown rice, and foods made with them. If you want pasta, go with whole wheat pasta.   Protein power - one-quarter of your plate: Fish, chicken, beans, and nuts are all healthy, versatile protein sources. Limit red meat.   The diet, however, does go beyond the plate, offering a few other suggestions.   Use healthy plant oils, such as olive, canola, soy, corn,  sunflower and peanut. Check the labels, and avoid partially hydrogenated oil, which have unhealthy trans fats.   If youre thirsty, drink water. Coffee and tea are good in moderation, but skip sugary drinks and limit milk and dairy products to one or two daily servings.   The type of carbohydrate in the diet is more important than the amount. Some sources of carbohydrates, such as vegetables, fruits, whole grains, and beans-are healthier than others.   Finally, stay active  Signed, Berniece Salines, DO  01/20/2020 11:09 AM    [place]

## 2020-01-20 NOTE — Patient Instructions (Signed)
Medication Instructions:  Your physician recommends that you continue on your current medications as directed. Please refer to the Current Medication list given to you today.  *If you need a refill on your cardiac medications before your next appointment, please call your pharmacy*   Lab Work: Your physician recommends that you return for lab work in: TODAY BMP, CBC, TSH, Vitamin D, Mag If you have labs (blood work) drawn today and your tests are completely normal, you will receive your results only by: Marland Kitchen MyChart Message (if you have MyChart) OR . A paper copy in the mail If you have any lab test that is abnormal or we need to change your treatment, we will call you to review the results.   Testing/Procedures: Your physician has requested that you have an echocardiogram. Echocardiography is a painless test that uses sound waves to create images of your heart. It provides your doctor with information about the size and shape of your heart and how well your heart's chambers and valves are working. This procedure takes approximately one hour. There are no restrictions for this procedure.  A zio monitor was ordered today. It will remain on for 7 days. You will then return monitor and event diary in provided box. It takes 1-2 weeks for report to be downloaded and returned to [place]. We will call you with the results. If monitor falls off or has orange flashing light, please call Zio for further instructions.      Follow-Up: At [facility], you and your health needs are our priority.  As part of our continuing mission to provide you with exceptional heart care, we have created designated Provider Care Teams.  These Care Teams include your primary Cardiologist (physician) and Advanced Practice Providers (APPs -  Physician Assistants and Nurse Practitioners) who all work together to provide you with the care you need, when you need it.  We recommend signing up for the patient portal called  "MyChart".  Sign up information is provided on this After Visit Summary.  MyChart is used to connect with patients for Virtual Visits (Telemedicine).  Patients are able to view lab/test results, encounter notes, upcoming appointments, etc.  Non-urgent messages can be sent to your provider as well.   To learn more about what you can do with MyChart, go to [email or web address].    Your next appointment:   3 month(s)  The format for your next appointment:   In Person  Provider:   Dr. Bettina Gavia   Other Instructions

## 2020-01-21 ENCOUNTER — Telehealth: Payer: Self-pay

## 2020-01-21 LAB — BASIC METABOLIC PANEL
BUN/Creatinine Ratio: 20 (ref 12–28)
BUN: 19 mg/dL (ref 8–27)
CO2: 21 mmol/L (ref 20–29)
Calcium: 9.4 mg/dL (ref 8.7–10.3)
Chloride: 105 mmol/L (ref 96–106)
Creatinine, Ser: 0.93 mg/dL (ref 0.57–1.00)
GFR calc Af Amer: 72 mL/min/{1.73_m2} (ref >59)
GFR calc non Af Amer: 62 mL/min/{1.73_m2} (ref >59)
Glucose: 126 mg/dL — ABNORMAL HIGH (ref 65–99)
Potassium: 4.8 mmol/L (ref 3.5–5.2)
Sodium: 139 mmol/L (ref 134–144)

## 2020-01-21 LAB — CBC
Hematocrit: 33.9 % — ABNORMAL LOW (ref 34.0–46.6)
Hemoglobin: 11.2 g/dL (ref 11.1–15.9)
MCH: 29.1 pg (ref 26.6–33.0)
MCHC: 33 g/dL (ref 31.5–35.7)
MCV: 88 fL (ref 79–97)
Platelets: 389 10*3/uL (ref 150–450)
RBC: 3.85 x10E6/uL (ref 3.77–5.28)
RDW: 12.9 % (ref 11.7–15.4)
WBC: 8.5 10*3/uL (ref 3.4–10.8)

## 2020-01-21 LAB — VITAMIN D 25 HYDROXY (VIT D DEFICIENCY, FRACTURES): Vit D, 25-Hydroxy: 41.6 ng/mL (ref 30.0–100.0)

## 2020-01-21 LAB — TSH: TSH: 7.36 u[IU]/mL — ABNORMAL HIGH (ref 0.450–4.500)

## 2020-01-21 LAB — MAGNESIUM: Magnesium: 1.6 mg/dL (ref 1.6–2.3)

## 2020-01-21 NOTE — Telephone Encounter (Signed)
-----   Message from Berniece Salines, DO sent at 01/21/2020  8:27 AM EDT ----- Let the patient know that her TSH is 7.36 which is elevated this may also be causing her symptoms.  Her thyroid medication may need to be adjusted by her PCP

## 2020-01-21 NOTE — Telephone Encounter (Signed)
Spoke with patient regarding results and recommendation.  Patient verbalizes understanding and is agreeable to plan of care. Advised patient to call back with any issues or concerns.  

## 2020-01-26 ENCOUNTER — Other Ambulatory Visit: Payer: Self-pay

## 2020-01-26 ENCOUNTER — Ambulatory Visit (INDEPENDENT_AMBULATORY_CARE_PROVIDER_SITE_OTHER): Payer: Medicare Other | Admitting: Family Medicine

## 2020-01-26 ENCOUNTER — Encounter: Payer: Self-pay | Admitting: Family Medicine

## 2020-01-26 ENCOUNTER — Ambulatory Visit: Payer: Medicare Other | Admitting: Family Medicine

## 2020-01-26 VITALS — BP 136/74 | HR 75 | Temp 96.6°F | Ht 68.0 in | Wt 195.0 lb

## 2020-01-26 DIAGNOSIS — E1121 Type 2 diabetes mellitus with diabetic nephropathy: Secondary | ICD-10-CM | POA: Diagnosis not present

## 2020-01-26 DIAGNOSIS — I110 Hypertensive heart disease with heart failure: Secondary | ICD-10-CM | POA: Diagnosis not present

## 2020-01-26 DIAGNOSIS — F330 Major depressive disorder, recurrent, mild: Secondary | ICD-10-CM

## 2020-01-26 DIAGNOSIS — E038 Other specified hypothyroidism: Secondary | ICD-10-CM

## 2020-01-26 DIAGNOSIS — E538 Deficiency of other specified B group vitamins: Secondary | ICD-10-CM

## 2020-01-26 MED ORDER — LEVOTHYROXINE SODIUM 75 MCG PO TABS: 75.0000 ug | ORAL_TABLET | Freq: Every day | ORAL | 0 refills | Status: DC

## 2020-01-26 MED ORDER — CYANOCOBALAMIN 1000 MCG/ML IJ SOLN: 1000.0000 ug | INTRAMUSCULAR | 2 refills | Status: DC

## 2020-01-26 NOTE — Progress Notes (Signed)
Established Patient Office Visit  Subjective:  Patient ID: April Munoz, female    DOB: 02-03-1951  Age: 69 y.o. MRN: [id number]  CC:  Chief Complaint  Patient presents with  . Gastroesophageal Reflux  . Hyperlipidemia  . Hypertension  . Diabetes    HPI April Munoz presents for follow up and concerns about fatigue for 4 months-pt states she takes a new rx for synthroid and is concerned the change in dosage was part of the fatigue.   Pt sees Cardio on Tuesday for 6 months f/u-currently wearing a monitor-a/fib and CHF in the past HTN-takes Losartan 100mg  daily-concern for fatigue, no cough-or qurg 175/95blood pressure home readings Hypothyroid-takes synthroid 70mcg daily-concern for fatigue GERD-takes prilosec daily DM-metformin BID Depression -PHQ9 -16-no SI-takes cymbalta  Past Medical History:  Diagnosis Date  . Anxiety   . Arthritis   . Ataxic gait   . Atrial fibrillation ([place])   . Atrophy of thyroid   . B12 deficiency 10/31/2017  . Cellulitis of abdominal wall 07/07/2016  . Depression   . Diabetes mellitus without complication ([place])   . Esophageal stricture 06/05/2017  . Essential hypertension 02/24/2013  . Fibromyalgia 02/24/2013  . Full dentures   . Hereditary and idiopathic peripheral neuropathy 02/24/2013  . Hyperlipidemia 02/24/2013  . Hypertension   . Incontinence   . Insomnia   . Murmur, cardiac 08/15/2015  . Neuropathy in diabetes ([place]) 10/31/2017  . Osteoporosis   . Other amnesia   . Other amnesia   . 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   . Paroxysmal atrial fibrillation ([place]) 08/15/2015   CHADS2vasc=3 CHADS2vasc=3  . Primary insomnia   . Restless legs syndrome 03/14/2013  . Thoracic or lumbosacral neuritis or radiculitis 02/24/2013  . Type 2 diabetes mellitus, without long-term current use of insulin ([place]) 02/24/2013  . Wears glasses     Past Surgical History:  Procedure Laterality Date  . A-FLUTTER ABLATION N/A 07/30/2019    Procedure: A-FLUTTER ABLATION;  Surgeon: Constance Haw, MD;  Location: [facility];  Service: Cardiovascular;  Laterality: N/A;  . ATRIAL FIBRILLATION ABLATION N/A 03/21/2019   Procedure: ATRIAL FIBRILLATION ABLATION;  Surgeon: Constance Haw, MD;  Location: [facility];  Service: Cardiovascular;  Laterality: N/A;  . BREAST REDUCTION SURGERY  1983  . CATARACT EXTRACTION, BILATERAL  2018, 2019  . CHOLECYSTECTOMY  1971   open  . DIAGNOSTIC LAPAROSCOPY  2010   lysis of adhesions  . DILATION AND CURETTAGE OF UTERUS  2004  . KNEE ARTHROSCOPY  1998   left  . KNEE ARTHROSCOPY W/ LATERAL RETINACULAR REPAIR    . MASS EXCISION Left 11/11/2013   Procedure: EXCISION MUCOID CYST LEFT INDEX FINGER/DEBRIDEMENT LEFT INDEX FINGER;  Surgeon: Wynonia Sours, MD;  Location: [facility];  Service: Orthopedics;  Laterality: Left;  ANESTHESIA: IV REGIONAL/FAB  . SHOULDER ARTHROSCOPY W/ ROTATOR CUFF REPAIR  2007   left  . TRIGGER FINGER RELEASE Left 11/11/2013   Procedure: RELEASE A-1 PULLEY LEFT RING FINGER;  Surgeon: Wynonia Sours, MD;  Location: [place];  Service: Orthopedics;  Laterality: Left;  . UMBILICAL HERNIA REPAIR  2008, 2010    Family History  Problem Relation Age of Onset  . Heart failure Father   . Heart disease Brother   . Heart attack Brother   . Heart failure Paternal Grandfather   . Stroke Paternal Grandfather   . Atrial fibrillation Mother   . Heart attack Mother   .  Diabetes Maternal Grandmother     Social History   Socioeconomic History  . Marital status: Married    Spouse name: Not on file  . Number of children: Not on file  . Years of education: Not on file  . Highest education level: Not on file  Occupational History  . Occupation: retired  Tobacco Use  . Smoking status: Never Smoker  . Smokeless tobacco: Never Used  Vaping Use  . Vaping Use: Never used  Substance and Sexual Activity  . Alcohol use: Yes     Alcohol/week: 1.0 - 2.0 standard drink    Types: 1 - 2 Glasses of wine per week    Comment: 4 times a week  . Drug use: No  . Sexual activity: Not on file  Other Topics Concern  . Not on file  Social History Narrative  . Not on file   Social Determinants of Health   Financial Resource Strain:   . Difficulty of Paying Living Expenses:   Food Insecurity:   . Worried About Charity fundraiser in the Last Year:   . Arboriculturist in the Last Year:   Transportation Needs:   . Film/video editor (Medical):   Marland Kitchen Lack of Transportation (Non-Medical):   Physical Activity:   . Days of Exercise per Week:   . Minutes of Exercise per Session:   Stress:   . Feeling of Stress :   Social Connections:   . Frequency of Communication with Friends and Family:   . Frequency of Social Gatherings with Friends and Family:   . Attends Religious Services:   . Active Member of Clubs or Organizations:   . Attends Archivist Meetings:   Marland Kitchen Marital Status:   Intimate Partner Violence:   . Fear of Current or Ex-Partner:   . Emotionally Abused:   Marland Kitchen Physically Abused:   . Sexually Abused:     Outpatient Medications Prior to Visit  Medication Sig Dispense Refill  . acyclovir (ZOVIRAX) 800 MG tablet TAKE ONE (1) TABLET FOUR (4) TIMES DAILYAS NEEDED FOR SHINGLES 120 tablet 0  . apixaban (ELIQUIS) 5 MG TABS tablet Take 1 tablet (5 mg total) by mouth 2 (two) times daily. 180 tablet 1  . B Complex-C-Folic Acid TABS Take 1 tablet by mouth daily.    . Biotin 1000 MCG tablet Take 1,000 mcg by mouth daily.    . Calcium-Vitamin D-Vitamin K (VIACTIV CALCIUM PLUS D) 650-12.5-40 MG-MCG-MCG CHEW Chew 1 capsule by mouth daily.    . cyanocobalamin (,VITAMIN B-12,) 1000 MCG/ML injection Inject 1,000 mcg into the muscle every 30 (thirty) days.    . diazepam (VALIUM) 5 MG tablet Take 5 mg by mouth 2 (two) times daily as needed.    Marland Kitchen ibuprofen (ADVIL) 200 MG tablet Take 400 mg by mouth every 6 (six) hours as  needed for headache or mild pain.     Marland Kitchen levothyroxine (SYNTHROID) 50 MCG tablet Take 1 tablet (50 mcg total) by mouth daily before breakfast. 90 tablet 0  . losartan (COZAAR) 50 MG tablet TAKE ONE (1) TABLET BY MOUTH ONCE DAILY 30 tablet 2  . metFORMIN (GLUCOPHAGE) 1000 MG tablet Take 1 tablet (1,000 mg total) by mouth 2 (two) times daily with a meal. 90 tablet 0  . metoprolol succinate (TOPROL XL) 25 MG 24 hr tablet Take 1 tablet (25 mg total) by mouth in the morning and at bedtime. 60 tablet 11  . Multiple Vitamin (MULTIVITAMIN WITH MINERALS) TABS  tablet Take 1 tablet by mouth daily.    Marland Kitchen omeprazole (PRILOSEC) 20 MG capsule TAKE ONE (1) CAPSULE EACH DAY 90 capsule 0  . pravastatin (PRAVACHOL) 20 MG tablet Take 20 mg by mouth 4 (four) times a week. Sun, Tue, Thu, Sat    . Probiotic Product (PROBIOTIC & ACIDOPHILUS EX ST PO) Take 1 tablet by mouth daily.     Marland Kitchen torsemide (DEMADEX) 20 MG tablet Take 1 tablet (20 mg total) by mouth daily as needed (swelling). 90 tablet 0  . venlafaxine XR (EFFEXOR-XR) 75 MG 24 hr capsule Take 1 capsule (75 mg total) by mouth daily. 90 capsule 0  . Vitamin D, Ergocalciferol, (DRISDOL) 1.25 MG (50000 UNIT) CAPS capsule Take 1 capsule (50,000 Units total) by mouth every Monday. 12 capsule 0  . zolpidem (AMBIEN) 5 MG tablet TAKE ONE TABLET BY MOUTH AT BEDTIME AS NEEDED. 30 tablet 2   No facility-administered medications prior to visit.    Allergies  Allergen Reactions  . Prednisone     In high doses causes her to feel crazy    ROS Review of Systems  Constitutional: Positive for fatigue. Negative for chills and fever.  HENT: Negative for congestion, ear pain, rhinorrhea and sore throat.   Respiratory: Negative for cough and shortness of breath.   Cardiovascular: Positive for leg swelling. Negative for chest pain.  Gastrointestinal: Negative for abdominal pain, constipation, diarrhea, nausea and vomiting.  Endocrine: Positive for polyuria.  Genitourinary:  Negative for dysuria and urgency.  Musculoskeletal: Negative for back pain and myalgias.  Neurological: Negative for dizziness, weakness, light-headedness and headaches.  Psychiatric/Behavioral: Positive for dysphoric mood. The patient is not nervous/anxious.       Objective:    Physical Exam Constitutional:      Appearance: She is well-developed.  HENT:     Right Ear: External ear normal.     Left Ear: External ear normal.     Nose: Nose normal.  Cardiovascular:     Rate and Rhythm: Normal rate and regular rhythm.     Pulses: Normal pulses.     Heart sounds: Normal heart sounds.  Pulmonary:     Effort: Pulmonary effort is normal.     Breath sounds: Normal breath sounds.  Abdominal:     General: Bowel sounds are normal.  Musculoskeletal:        General: Normal range of motion.     Cervical back: Normal range of motion and neck supple.  Skin:    General: Skin is warm.  Neurological:     Mental Status: She is alert and oriented to person, place, and time.  Psychiatric:        Mood and Affect: Mood normal.        Behavior: Behavior normal.     BP 136/74   Pulse 75   Temp (!) 96.6 F (35.9 C)   Ht 5\' 8"  (1.727 m)   Wt 195 lb (88.5 kg)   SpO2 96%   BMI 29.65 kg/m  Wt Readings from Last 3 Encounters:  01/26/20 195 lb (88.5 kg)  01/20/20 195 lb (88.5 kg)  12/11/19 197 lb (89.4 kg)     Health Maintenance Due  Topic Date Due  . Hepatitis C Screening  Never done  . FOOT EXAM  Never done  . TETANUS/TDAP  Never done    There are no preventive care reminders to display for this patient.  Lab Results  Component Value Date   TSH 7.360 (H) 01/20/2020  Lab Results  Component Value Date   WBC 8.5 01/20/2020   HGB 11.2 01/20/2020   HCT 33.9 (L) 01/20/2020   MCV 88 01/20/2020   PLT 389 01/20/2020   Lab Results  Component Value Date   NA 139 01/20/2020   K 4.8 01/20/2020   CO2 21 01/20/2020   GLUCOSE 126 (H) 01/20/2020   BUN 19 01/20/2020   CREATININE  0.93 01/20/2020   BILITOT 0.3 10/06/2019   ALKPHOS 66 10/06/2019   AST 18 10/06/2019   ALT 21 10/06/2019   PROT 6.0 10/06/2019   ALBUMIN 4.0 10/06/2019   CALCIUM 9.4 01/20/2020   ANIONGAP 11 03/21/2019   Lab Results  Component Value Date   CHOL 183 09/18/2019   Lab Results  Component Value Date   HDL 61 09/18/2019   Lab Results  Component Value Date   LDLCALC 86 09/18/2019   Lab Results  Component Value Date   TRIG 215 (H) 09/18/2019   Lab Results  Component Value Date   CHOLHDL 3.0 09/18/2019   Lab Results  Component Value Date   HGBA1C 6.7 (H) 09/18/2019      Assessment & Plan:  1. Diabetic glomerulopathy (HCC)-no recent glucose checks - Hemoglobin A1c metfomin-stable 2. Secondary hypothyroidism-stable - TSH; Future-recent change to 22mcg-needs recheck after 6 weeks - levothyroxine (SYNTHROID) 75 MCG tablet; Take 1 tablet (75 mcg total) by mouth daily before breakfast.  Dispense: 30 tablet; Refill: 0 Recent change in medication 3. Depression, major, recurrent, mild ([place]) D/w pt pt cymbalta not adequately treating depression-pt has tried other medications in the past with little success. Pt agrees to referral - Ambulatory referral to Psychology  4. Hypertensive heart disease with congestive heart failure, unspecified heart failure type [facility]) - Comprehensive metabolic panel Cardiology following with monitor in place.  5. B12 deficiency - cyanocobalamin (,VITAMIN B-12,) 1000 MCG/ML injection; Inject 1 mL (1,000 mcg total) into the muscle every 30 (thirty) days.  Dispense: 1 mL; Refill: 2 - Vitamin B12 Follow-up: 3 months   Braylon Grenda Hannah Beat, MD

## 2020-01-26 NOTE — Patient Instructions (Addendum)
Synthroid (thyroid medication) take medication by itself.  Monitor blood pressure and glucos at home daily.

## 2020-01-27 ENCOUNTER — Other Ambulatory Visit: Payer: Self-pay

## 2020-01-27 ENCOUNTER — Ambulatory Visit: Payer: Medicare Other

## 2020-01-27 ENCOUNTER — Other Ambulatory Visit: Payer: Self-pay | Admitting: Family Medicine

## 2020-01-27 DIAGNOSIS — E875 Hyperkalemia: Secondary | ICD-10-CM

## 2020-01-27 DIAGNOSIS — E038 Other specified hypothyroidism: Secondary | ICD-10-CM

## 2020-01-27 LAB — COMPREHENSIVE METABOLIC PANEL
ALT: 20 IU/L (ref 0–32)
AST: 19 IU/L (ref 0–40)
Albumin/Globulin Ratio: 2 (ref 1.2–2.2)
Albumin: 4.3 g/dL (ref 3.8–4.8)
Alkaline Phosphatase: 63 IU/L (ref 48–121)
BUN/Creatinine Ratio: 25 (ref 12–28)
BUN: 26 mg/dL (ref 8–27)
Bilirubin Total: 0.3 mg/dL (ref 0.0–1.2)
CO2: 24 mmol/L (ref 20–29)
Calcium: 9.7 mg/dL (ref 8.7–10.3)
Chloride: 104 mmol/L (ref 96–106)
Creatinine, Ser: 1.02 mg/dL — ABNORMAL HIGH (ref 0.57–1.00)
GFR calc Af Amer: 64 mL/min/{1.73_m2} (ref >59)
GFR calc non Af Amer: 56 mL/min/{1.73_m2} — ABNORMAL LOW (ref >59)
Globulin, Total: 2.1 g/dL (ref 1.5–4.5)
Glucose: 111 mg/dL — ABNORMAL HIGH (ref 65–99)
Potassium: 6.2 mmol/L (ref 3.5–5.2)
Sodium: 142 mmol/L (ref 134–144)
Total Protein: 6.4 g/dL (ref 6.0–8.5)

## 2020-01-27 LAB — VITAMIN B12: Vitamin B-12: 416 pg/mL (ref 232–1245)

## 2020-01-27 LAB — HEMOGLOBIN A1C
Est. average glucose Bld gHb Est-mCnc: 160 mg/dL
Hgb A1c MFr Bld: 7.2 % — ABNORMAL HIGH (ref 4.8–5.6)

## 2020-01-28 LAB — COMPREHENSIVE METABOLIC PANEL
ALT: 18 IU/L (ref 0–32)
AST: 17 IU/L (ref 0–40)
Albumin/Globulin Ratio: 2 (ref 1.2–2.2)
Albumin: 4.5 g/dL (ref 3.8–4.8)
Alkaline Phosphatase: 66 IU/L (ref 48–121)
BUN/Creatinine Ratio: 21 (ref 12–28)
BUN: 19 mg/dL (ref 8–27)
Bilirubin Total: 0.3 mg/dL (ref 0.0–1.2)
CO2: 25 mmol/L (ref 20–29)
Calcium: 9.6 mg/dL (ref 8.7–10.3)
Chloride: 102 mmol/L (ref 96–106)
Creatinine, Ser: 0.91 mg/dL (ref 0.57–1.00)
GFR calc Af Amer: 74 mL/min/{1.73_m2} (ref >59)
GFR calc non Af Amer: 64 mL/min/{1.73_m2} (ref >59)
Globulin, Total: 2.2 g/dL (ref 1.5–4.5)
Glucose: 122 mg/dL — ABNORMAL HIGH (ref 65–99)
Potassium: 5 mmol/L (ref 3.5–5.2)
Sodium: 138 mmol/L (ref 134–144)
Total Protein: 6.7 g/dL (ref 6.0–8.5)

## 2020-02-05 ENCOUNTER — Ambulatory Visit (INDEPENDENT_AMBULATORY_CARE_PROVIDER_SITE_OTHER): Payer: Medicare Other

## 2020-02-05 ENCOUNTER — Other Ambulatory Visit: Payer: Self-pay

## 2020-02-05 DIAGNOSIS — I480 Paroxysmal atrial fibrillation: Secondary | ICD-10-CM

## 2020-02-05 LAB — ECHOCARDIOGRAM COMPLETE
Area-P 1/2: 3.6 cm2
S' Lateral: 3.4 cm

## 2020-02-05 NOTE — Progress Notes (Signed)
Complete echocardiogram performed.  Jimmy Idy Rawling RDCS, RVT  

## 2020-02-06 ENCOUNTER — Telehealth: Payer: Self-pay

## 2020-02-06 NOTE — Telephone Encounter (Signed)
Spoke with patient regarding results and recommendation.  Patient verbalizes understanding and is agreeable to plan of care. Advised patient to call back with any issues or concerns.  

## 2020-02-06 NOTE — Telephone Encounter (Signed)
-----   Message from Berniece Salines, DO sent at 02/06/2020 10:16 AM EDT ----- There is mildly dilated ascending aorta, this can be reassessed in a year.  The echo showed that the heart is not fully relaxing like it should ( diastolic dysfunction) ,but otherwise normal.

## 2020-02-11 DIAGNOSIS — M1711 Unilateral primary osteoarthritis, right knee: Secondary | ICD-10-CM | POA: Diagnosis not present

## 2020-02-11 DIAGNOSIS — M25561 Pain in right knee: Secondary | ICD-10-CM | POA: Diagnosis not present

## 2020-02-12 DIAGNOSIS — I482 Chronic atrial fibrillation, unspecified: Secondary | ICD-10-CM | POA: Diagnosis not present

## 2020-02-17 ENCOUNTER — Telehealth: Payer: Self-pay

## 2020-02-17 NOTE — Telephone Encounter (Signed)
-----   Message from Berniece Salines, DO sent at 02/17/2020  9:08 AM EDT ----- Your monitor did not show any afib.  There are a few beats from the bottom of the heart if your symptoms present palpitations we can increase her beta-blocker.  Or you can discuss this further when you see Dr. Bettina Gavia

## 2020-02-17 NOTE — Telephone Encounter (Signed)
Spoke with patient regarding results and recommendation.  Patient verbalizes understanding and is agreeable to plan of care. Advised patient to call back with any issues or concerns.  

## 2020-02-23 DIAGNOSIS — M1712 Unilateral primary osteoarthritis, left knee: Secondary | ICD-10-CM | POA: Diagnosis not present

## 2020-02-26 ENCOUNTER — Other Ambulatory Visit: Payer: Medicare Other

## 2020-02-26 ENCOUNTER — Other Ambulatory Visit: Payer: Self-pay

## 2020-02-26 DIAGNOSIS — E034 Atrophy of thyroid (acquired): Secondary | ICD-10-CM | POA: Diagnosis not present

## 2020-02-26 DIAGNOSIS — E782 Mixed hyperlipidemia: Secondary | ICD-10-CM

## 2020-02-26 DIAGNOSIS — E559 Vitamin D deficiency, unspecified: Secondary | ICD-10-CM | POA: Diagnosis not present

## 2020-02-27 LAB — LIPID PANEL
Chol/HDL Ratio: 3.4 ratio (ref 0.0–4.4)
Cholesterol, Total: 231 mg/dL — ABNORMAL HIGH (ref 100–199)
HDL: 67 mg/dL (ref >39)
LDL Chol Calc (NIH): 131 mg/dL — ABNORMAL HIGH (ref 0–99)
Triglycerides: 189 mg/dL — ABNORMAL HIGH (ref 0–149)
VLDL Cholesterol Cal: 33 mg/dL (ref 5–40)

## 2020-02-27 LAB — TSH: TSH: 3.24 u[IU]/mL (ref 0.450–4.500)

## 2020-02-27 LAB — CARDIOVASCULAR RISK ASSESSMENT

## 2020-02-27 LAB — VITAMIN D 25 HYDROXY (VIT D DEFICIENCY, FRACTURES): Vit D, 25-Hydroxy: 36.9 ng/mL (ref 30.0–100.0)

## 2020-03-01 DIAGNOSIS — M1712 Unilateral primary osteoarthritis, left knee: Secondary | ICD-10-CM | POA: Diagnosis not present

## 2020-03-08 DIAGNOSIS — M1712 Unilateral primary osteoarthritis, left knee: Secondary | ICD-10-CM | POA: Diagnosis not present

## 2020-03-09 ENCOUNTER — Other Ambulatory Visit: Payer: Self-pay | Admitting: Family Medicine

## 2020-03-09 DIAGNOSIS — R809 Proteinuria, unspecified: Secondary | ICD-10-CM | POA: Diagnosis not present

## 2020-03-09 DIAGNOSIS — N182 Chronic kidney disease, stage 2 (mild): Secondary | ICD-10-CM | POA: Diagnosis not present

## 2020-03-09 DIAGNOSIS — I129 Hypertensive chronic kidney disease with stage 1 through stage 4 chronic kidney disease, or unspecified chronic kidney disease: Secondary | ICD-10-CM | POA: Diagnosis not present

## 2020-03-09 DIAGNOSIS — E038 Other specified hypothyroidism: Secondary | ICD-10-CM

## 2020-03-09 DIAGNOSIS — E559 Vitamin D deficiency, unspecified: Secondary | ICD-10-CM | POA: Diagnosis not present

## 2020-03-15 ENCOUNTER — Other Ambulatory Visit: Payer: Self-pay | Admitting: Physician Assistant

## 2020-03-24 ENCOUNTER — Other Ambulatory Visit: Payer: Self-pay | Admitting: Physician Assistant

## 2020-03-25 ENCOUNTER — Other Ambulatory Visit: Payer: Self-pay

## 2020-03-25 MED ORDER — VENLAFAXINE HCL ER 75 MG PO CP24: 75.0000 mg | ORAL_CAPSULE | Freq: Every day | ORAL | 0 refills | Status: DC

## 2020-03-30 DIAGNOSIS — D1801 Hemangioma of skin and subcutaneous tissue: Secondary | ICD-10-CM | POA: Diagnosis not present

## 2020-03-30 DIAGNOSIS — L578 Other skin changes due to chronic exposure to nonionizing radiation: Secondary | ICD-10-CM | POA: Diagnosis not present

## 2020-03-30 DIAGNOSIS — W908XXS Exposure to other nonionizing radiation, sequela: Secondary | ICD-10-CM | POA: Diagnosis not present

## 2020-03-30 DIAGNOSIS — L821 Other seborrheic keratosis: Secondary | ICD-10-CM | POA: Diagnosis not present

## 2020-03-30 DIAGNOSIS — L814 Other melanin hyperpigmentation: Secondary | ICD-10-CM | POA: Diagnosis not present

## 2020-03-30 DIAGNOSIS — X32XXXS Exposure to sunlight, sequela: Secondary | ICD-10-CM | POA: Diagnosis not present

## 2020-04-01 DIAGNOSIS — R1013 Epigastric pain: Secondary | ICD-10-CM | POA: Diagnosis not present

## 2020-04-01 DIAGNOSIS — K22719 Barrett's esophagus with dysplasia, unspecified: Secondary | ICD-10-CM | POA: Diagnosis not present

## 2020-04-01 DIAGNOSIS — K589 Irritable bowel syndrome without diarrhea: Secondary | ICD-10-CM | POA: Diagnosis not present

## 2020-04-12 DIAGNOSIS — K5730 Diverticulosis of large intestine without perforation or abscess without bleeding: Secondary | ICD-10-CM | POA: Diagnosis not present

## 2020-04-12 DIAGNOSIS — D3501 Benign neoplasm of right adrenal gland: Secondary | ICD-10-CM | POA: Diagnosis not present

## 2020-04-12 DIAGNOSIS — I700 Atherosclerosis of aorta: Secondary | ICD-10-CM | POA: Diagnosis not present

## 2020-04-12 DIAGNOSIS — D3502 Benign neoplasm of left adrenal gland: Secondary | ICD-10-CM | POA: Diagnosis not present

## 2020-04-12 DIAGNOSIS — Z01812 Encounter for preprocedural laboratory examination: Secondary | ICD-10-CM | POA: Diagnosis not present

## 2020-04-12 DIAGNOSIS — R1031 Right lower quadrant pain: Secondary | ICD-10-CM | POA: Diagnosis not present

## 2020-04-12 DIAGNOSIS — R918 Other nonspecific abnormal finding of lung field: Secondary | ICD-10-CM | POA: Diagnosis not present

## 2020-04-15 DIAGNOSIS — Z23 Encounter for immunization: Secondary | ICD-10-CM | POA: Diagnosis not present

## 2020-04-23 ENCOUNTER — Other Ambulatory Visit: Payer: Self-pay

## 2020-04-23 DIAGNOSIS — Z973 Presence of spectacles and contact lenses: Secondary | ICD-10-CM | POA: Insufficient documentation

## 2020-04-23 DIAGNOSIS — R32 Unspecified urinary incontinence: Secondary | ICD-10-CM | POA: Insufficient documentation

## 2020-04-23 DIAGNOSIS — E119 Type 2 diabetes mellitus without complications: Secondary | ICD-10-CM | POA: Insufficient documentation

## 2020-04-23 DIAGNOSIS — R413 Other amnesia: Secondary | ICD-10-CM | POA: Insufficient documentation

## 2020-04-23 DIAGNOSIS — K08109 Complete loss of teeth, unspecified cause, unspecified class: Secondary | ICD-10-CM | POA: Insufficient documentation

## 2020-04-23 DIAGNOSIS — Z972 Presence of dental prosthetic device (complete) (partial): Secondary | ICD-10-CM | POA: Insufficient documentation

## 2020-04-23 DIAGNOSIS — G4700 Insomnia, unspecified: Secondary | ICD-10-CM | POA: Insufficient documentation

## 2020-04-23 DIAGNOSIS — I10 Essential (primary) hypertension: Secondary | ICD-10-CM | POA: Insufficient documentation

## 2020-04-23 DIAGNOSIS — I4891 Unspecified atrial fibrillation: Secondary | ICD-10-CM | POA: Insufficient documentation

## 2020-04-23 DIAGNOSIS — R260 Ataxic gait: Secondary | ICD-10-CM | POA: Insufficient documentation

## 2020-04-23 DIAGNOSIS — M1990 Unspecified osteoarthritis, unspecified site: Secondary | ICD-10-CM | POA: Insufficient documentation

## 2020-04-23 DIAGNOSIS — F419 Anxiety disorder, unspecified: Secondary | ICD-10-CM | POA: Insufficient documentation

## 2020-04-23 DIAGNOSIS — F5101 Primary insomnia: Secondary | ICD-10-CM | POA: Insufficient documentation

## 2020-04-23 DIAGNOSIS — G458 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: Secondary | ICD-10-CM | POA: Insufficient documentation

## 2020-04-25 NOTE — Progress Notes (Signed)
Cardiology Office Note:    Date:  04/26/2020   ID:  April Munoz, DOB [birth year], MRN [id number]  PCP:  [name], MD  Cardiologist:  Shirlee More, MD    Referring MD: [name], MD    ASSESSMENT:    1. Shortness of breath   2. Paroxysmal atrial fibrillation (HCC)   3. Typical atrial flutter ([place])   4. Chronic anticoagulation   5. Hypertensive heart disease without heart failure   6. Mixed hyperlipidemia   7. Abnormal CT of the abdomen    PLAN:    In order of problems listed above:  1. Multiple mechanisms including what appears to be chronic pulmonary infection CT of the chest ordered and heart failure and have asked her to take her diuretic 3 days a week.  If CT confirms atypical pulmonary infection she need referral to pulmonary.  I have referred her primary care physician for the consultation. 2. Stable no recurrent atrial arrhythmia continue her anticoagulant and I told her I do not think she has an indication for the watchman device 3. BP at target continue ARB and start taking her diuretic Monday Wednesdays and Friday 4. She is unsure why but she is no longer on pravastatin I asked her to resume with her previous TIA and hyperlipidemia 5. CT of the chest ordered   Next appointment: 3 months   Medication Adjustments/Labs and Tests Ordered: Current medicines are reviewed at length with the patient today.  Concerns regarding medicines are outlined above.  No orders of the defined types were placed in this encounter.  Meds ordered this encounter  Medications  . torsemide (DEMADEX) 20 MG tablet    Sig: Take 1 tablet (20 mg total) by mouth 3 (three) times a week. Please take this on Monday, Wednesday, and Friday.    Dispense:  90 tablet    Refill:  0  . pravastatin (PRAVACHOL) 20 MG tablet    Sig: Take 1 tablet (20 mg total) by mouth 4 (four) times a week. Sun, Tue, Thu, Sat    Dispense:  20 tablet    Refill:  3    Chief Complaint  Patient presents with  .  Follow-up  . Shortness of Breath    History of Present Illness:    April Munoz is a 69 y.o. female with a hx of atrial fibrillation moderate stroke risk CHADS2 score 4 with previous TIA anticoagulated as well as hyperlipidemia hypertension and type 2 diabetes mellitus.  She has had EP catheter ablation for for atrial fibrillation and most recently atrial flutter 04/10/2019.  She was last seen 09/02/2019.  Compliance with diet, lifestyle and medications: Yes  She had an echocardiogram 02/05/2020 showing ejection fraction 60 to 65% mild concentric LVH elevated left ventricular filling pressure and no significant valvular abnormality.  Extended ZIO monitor for 7 days showed no recurrence of atrial fibrillation or flutter and no bradycardia.  She had a CTA of the abdomen and pelvis done for abdominal pain that showed stable diverticular disease bilateral adrenal adenomas and new finding of tree-in-bud nodularity posterior left lower lobe suggesting atypical pulmonary infection.  In general she does not feel well she has ongoing abdominal problems and is pending surgical evaluation Dr. Orrin Brigham in [place].  Her other complaint is shortness of breath mostly with bending over and a chronic cough.  CT of the abdomen showed what appeared to be an atypical pulmonary infection.  She has no edema but rarely takes a diuretic and at times  shortness of breath bending over is very typical of heart failure I will have her take her loop diuretic 3 days a week and have a full CT scan of her chest performed.  She is not having syncope palpitation or chest pain.  Compliant with her anticoagulant without bleeding complication.  Asked me about a watchman device and I told her in her case I do not think it is indicated. Past Medical History:  Diagnosis Date  . Anxiety   . Arthritis   . Ataxic gait   . Atrial fibrillation ([place])   . Atrophy of thyroid   . B12 deficiency 10/31/2017  . Cellulitis of abdominal wall  07/07/2016  . Chronic anticoagulation 05/31/2018  . Depression   . Depression, major, recurrent, mild ([place]) 09/16/2019  . Diabetes mellitus without complication ([place])   . Diabetic glomerulopathy ([place]) 09/16/2019  . Esophageal stricture 06/05/2017  . Essential hypertension 02/24/2013  . Fatigue 01/20/2020  . Fibromyalgia 02/24/2013  . Full dentures   . Hereditary and idiopathic peripheral neuropathy 02/24/2013  . High risk medication use 11/07/2017  . Hyperlipidemia 02/24/2013  . Hypertension   . Hypertensive heart disease 02/24/2013  . Incontinence   . Insomnia   . Longstanding persistent atrial fibrillation ([place]) 09/16/2019  . Mild vitamin D deficiency 09/16/2019  . Mixed hyperlipidemia 02/24/2013  . Murmur, cardiac 08/15/2015  . Neuropathy in diabetes ([place]) 10/31/2017  . Osteoporosis   . Other amnesia   . Other amnesia   . 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   . Paroxysmal atrial fibrillation ([place]) 08/15/2015   CHADS2vasc=3 CHADS2vasc=3  . Primary insomnia   . Restless legs syndrome 03/14/2013  . Secondary hypothyroidism 09/16/2019  . Thoracic or lumbosacral neuritis or radiculitis 02/24/2013  . Type 2 diabetes mellitus, without long-term current use of insulin ([place]) 02/24/2013  . Urge incontinence of urine 09/16/2019  . Wears glasses     Past Surgical History:  Procedure Laterality Date  . A-FLUTTER ABLATION N/A 07/30/2019   Procedure: A-FLUTTER ABLATION;  Surgeon: Constance Haw, MD;  Location: [place] [facility];  Service: Cardiovascular;  Laterality: N/A;  . ATRIAL FIBRILLATION ABLATION N/A 03/21/2019   Procedure: ATRIAL FIBRILLATION ABLATION;  Surgeon: Constance Haw, MD;  Location: [place] [facility];  Service: Cardiovascular;  Laterality: N/A;  . BREAST REDUCTION SURGERY  1983  . CATARACT EXTRACTION, BILATERAL  2018, 2019  . CHOLECYSTECTOMY  1971   open  . DIAGNOSTIC LAPAROSCOPY  2010   lysis of adhesions  . DILATION AND CURETTAGE OF UTERUS  2004    . KNEE ARTHROSCOPY  1998   left  . KNEE ARTHROSCOPY W/ LATERAL RETINACULAR REPAIR    . MASS EXCISION Left 11/11/2013   Procedure: EXCISION MUCOID CYST LEFT INDEX FINGER/DEBRIDEMENT LEFT INDEX FINGER;  Surgeon: Wynonia Sours, MD;  Location: [place];  Service: Orthopedics;  Laterality: Left;  ANESTHESIA: IV REGIONAL/FAB  . SHOULDER ARTHROSCOPY W/ ROTATOR CUFF REPAIR  2007   left  . TRIGGER FINGER RELEASE Left 11/11/2013   Procedure: RELEASE A-1 PULLEY LEFT RING FINGER;  Surgeon: Wynonia Sours, MD;  Location: [place] [facility];  Service: Orthopedics;  Laterality: Left;  . UMBILICAL HERNIA REPAIR  2008, 2010    Current Medications: Current Meds  Medication Sig  . acyclovir (ZOVIRAX) 800 MG tablet TAKE ONE (1) TABLET FOUR (4) TIMES DAILYAS NEEDED FOR SHINGLES  . apixaban (ELIQUIS) 5 MG TABS tablet Take 1 tablet (5 mg total) by mouth 2 (  two) times daily.  . B Complex-C-Folic Acid TABS Take 1 tablet by mouth daily.  . Biotin 1000 MCG tablet Take 1,000 mcg by mouth daily.  . Calcium-Vitamin D-Vitamin K (VIACTIV CALCIUM PLUS D) 650-12.5-40 MG-MCG-MCG CHEW Chew 1 capsule by mouth daily.  . cyanocobalamin (,VITAMIN B-12,) 1000 MCG/ML injection Inject 1 mL (1,000 mcg total) into the muscle every 30 (thirty) days.  Marland Kitchen EPINEPHrine 0.3 mg/0.3 mL IJ SOAJ injection Inject into the muscle as needed.  Marland Kitchen ibuprofen (ADVIL) 200 MG tablet Take 400 mg by mouth every 6 (six) hours as needed for headache or mild pain.   Marland Kitchen levothyroxine (SYNTHROID) 75 MCG tablet Take 1 tablet (75 mcg total) by mouth daily before breakfast.  . losartan (COZAAR) 50 MG tablet TAKE ONE (1) TABLET BY MOUTH ONCE DAILY  . metFORMIN (GLUCOPHAGE) 1000 MG tablet TAKE ONE TABLET TWICE DAILY WITH MEALS  . metoprolol succinate (TOPROL XL) 25 MG 24 hr tablet Take 1 tablet (25 mg total) by mouth in the morning and at bedtime.  . Multiple Vitamin (MULTIVITAMIN WITH MINERALS) TABS tablet Take 1 tablet by mouth daily.  Marland Kitchen  omeprazole (PRILOSEC) 20 MG capsule TAKE ONE (1) CAPSULE EACH DAY  . Probiotic Product (PROBIOTIC & ACIDOPHILUS EX ST PO) Take 1 tablet by mouth daily.   Marland Kitchen torsemide (DEMADEX) 20 MG tablet Take 1 tablet (20 mg total) by mouth 3 (three) times a week. Please take this on Monday, Wednesday, and Friday.  . tretinoin (RETIN-A) 0.05 % cream Apply 1 application topically at bedtime.  Marland Kitchen venlafaxine XR (EFFEXOR-XR) 75 MG 24 hr capsule Take 1 capsule (75 mg total) by mouth daily.  . Vitamin D, Ergocalciferol, (DRISDOL) 1.25 MG (50000 UNIT) CAPS capsule Take 1 capsule (50,000 Units total) by mouth every Monday.  . Zinc Sulfate (ZINC 15 PO) Take 1 tablet by mouth daily.  Marland Kitchen zolpidem (AMBIEN) 5 MG tablet TAKE ONE TABLET BY MOUTH AT BEDTIME AS NEEDED.  . [DISCONTINUED] torsemide (DEMADEX) 20 MG tablet Take 1 tablet (20 mg total) by mouth daily as needed (swelling).     Allergies:   Prednisone   Social History   Socioeconomic History  . Marital status: Married    Spouse name: Not on file  . Number of children: Not on file  . Years of education: Not on file  . Highest education level: Not on file  Occupational History  . Occupation: retired  Tobacco Use  . Smoking status: Never Smoker  . Smokeless tobacco: Never Used  Vaping Use  . Vaping Use: Never used  Substance and Sexual Activity  . Alcohol use: Yes    Alcohol/week: 1.0 - 2.0 standard drink    Types: 1 - 2 Glasses of wine per week    Comment: 4 times a week  . Drug use: No  . Sexual activity: Not on file  Other Topics Concern  . Not on file  Social History Narrative  . Not on file   Social Determinants of Health   Financial Resource Strain:   . Difficulty of Paying Living Expenses: Not on file  Food Insecurity:   . Worried About Charity fundraiser in the Last Year: Not on file  . Ran Out of Food in the Last Year: Not on file  Transportation Needs:   . Lack of Transportation (Medical): Not on file  . Lack of Transportation  (Non-Medical): Not on file  Physical Activity:   . Days of Exercise per Week: Not on file  .  Minutes of Exercise per Session: Not on file  Stress:   . Feeling of Stress : Not on file  Social Connections:   . Frequency of Communication with Friends and Family: Not on file  . Frequency of Social Gatherings with Friends and Family: Not on file  . Attends Religious Services: Not on file  . Active Member of Clubs or Organizations: Not on file  . Attends Archivist Meetings: Not on file  . Marital Status: Not on file     Family History: The patient's family history includes Atrial fibrillation in her mother; Diabetes in her maternal grandmother; Heart attack in her brother and mother; Heart disease in her brother; Heart failure in her father and paternal grandfather; Stroke in her paternal grandfather. ROS:   Please see the history of present illness.    All other systems reviewed and are negative.  EKGs/Labs/Other Studies Reviewed:    The following studies were reviewed today:    Recent Labs: 01/20/2020: Hemoglobin 11.2; Magnesium 1.6; Platelets 389 01/27/2020: ALT 18; BUN 19; Creatinine, Ser 0.91; Potassium 5.0; Sodium 138 02/26/2020: TSH 3.240  Recent Lipid Panel    Component Value Date/Time   CHOL 231 (H) 02/26/2020 0816   TRIG 189 (H) 02/26/2020 0816   HDL 67 02/26/2020 0816   CHOLHDL 3.4 02/26/2020 0816   LDLCALC 131 (H) 02/26/2020 0816    Physical Exam:    VS:  BP 135/82   Pulse 80   Ht 5\' 8"  (1.727 m)   Wt 195 lb 3.2 oz (88.5 kg)   SpO2 98%   BMI 29.68 kg/m     Wt Readings from Last 3 Encounters:  04/26/20 195 lb 3.2 oz (88.5 kg)  01/26/20 195 lb (88.5 kg)  01/20/20 195 lb (88.5 kg)     GEN:  Well nourished, well developed in no acute distress HEENT: Normal NECK: No JVD; No carotid bruits LYMPHATICS: No lymphadenopathy CARDIAC: RRR, no murmurs, rubs, gallops RESPIRATORY:  Clear to auscultation without rales, wheezing or rhonchi  ABDOMEN: Soft,  non-tender, non-distended MUSCULOSKELETAL:  No edema; No deformity  SKIN: Warm and dry NEUROLOGIC:  Alert and oriented x 3 PSYCHIATRIC:  Normal affect    Signed, Shirlee More, MD  04/26/2020 11:18 AM    [place]

## 2020-04-26 ENCOUNTER — Other Ambulatory Visit: Payer: Self-pay

## 2020-04-26 ENCOUNTER — Encounter: Payer: Self-pay | Admitting: Cardiology

## 2020-04-26 ENCOUNTER — Ambulatory Visit (INDEPENDENT_AMBULATORY_CARE_PROVIDER_SITE_OTHER): Payer: Medicare Other | Admitting: Cardiology

## 2020-04-26 VITALS — BP 135/82 | HR 80 | Ht 68.0 in | Wt 195.2 lb

## 2020-04-26 DIAGNOSIS — Z7901 Long term (current) use of anticoagulants: Secondary | ICD-10-CM | POA: Diagnosis not present

## 2020-04-26 DIAGNOSIS — I480 Paroxysmal atrial fibrillation: Secondary | ICD-10-CM

## 2020-04-26 DIAGNOSIS — R0602 Shortness of breath: Secondary | ICD-10-CM | POA: Diagnosis not present

## 2020-04-26 DIAGNOSIS — R918 Other nonspecific abnormal finding of lung field: Secondary | ICD-10-CM

## 2020-04-26 DIAGNOSIS — I119 Hypertensive heart disease without heart failure: Secondary | ICD-10-CM | POA: Diagnosis not present

## 2020-04-26 DIAGNOSIS — E782 Mixed hyperlipidemia: Secondary | ICD-10-CM | POA: Diagnosis not present

## 2020-04-26 DIAGNOSIS — I483 Typical atrial flutter: Secondary | ICD-10-CM | POA: Diagnosis not present

## 2020-04-26 DIAGNOSIS — R935 Abnormal findings on diagnostic imaging of other abdominal regions, including retroperitoneum: Secondary | ICD-10-CM

## 2020-04-26 MED ORDER — TORSEMIDE 20 MG PO TABS
20.0000 mg | ORAL_TABLET | ORAL | 0 refills | Status: DC
Start: 2020-04-26 — End: 2021-10-20

## 2020-04-26 MED ORDER — PRAVASTATIN SODIUM 20 MG PO TABS
20.0000 mg | ORAL_TABLET | ORAL | 3 refills | Status: DC
Start: 2020-04-27 — End: 2020-05-27

## 2020-04-26 NOTE — Progress Notes (Signed)
ct 

## 2020-04-26 NOTE — Patient Instructions (Signed)
Medication Instructions:  Your physician has recommended you make the following change in your medication:  INCREASE: Torsemide 20 mg take one tablet by mouth daily on Monday, Wednesday, and Friday RESTART: Pravastatin 20 mg by mouth four times weekly.  *If you need a refill on your cardiac medications before your next appointment, please call your pharmacy*   Lab Work: None If you have labs (blood work) drawn today and your tests are completely normal, you will receive your results only by: Marland Kitchen MyChart Message (if you have MyChart) OR . A paper copy in the mail If you have any lab test that is abnormal or we need to change your treatment, we will call you to review the results.   Testing/Procedures: We have put in an order for you to have a chest CT done at [facility]. They will call you to schedule this appointment.    Follow-Up: At [facility], you and your health needs are our priority.  As part of our continuing mission to provide you with exceptional heart care, we have created designated Provider Care Teams.  These Care Teams include your primary Cardiologist (physician) and Advanced Practice Providers (APPs -  Physician Assistants and Nurse Practitioners) who all work together to provide you with the care you need, when you need it.  We recommend signing up for the patient portal called "MyChart".  Sign up information is provided on this After Visit Summary.  MyChart is used to connect with patients for Virtual Visits (Telemedicine).  Patients are able to view lab/test results, encounter notes, upcoming appointments, etc.  Non-urgent messages can be sent to your provider as well.   To learn more about what you can do with MyChart, go to [email or web address].    Your next appointment:   6 month(s)  The format for your next appointment:   In Person  Provider:   Shirlee More, MD   Other Instructions

## 2020-04-27 ENCOUNTER — Other Ambulatory Visit: Payer: Self-pay | Admitting: Physician Assistant

## 2020-04-27 DIAGNOSIS — R1903 Right lower quadrant abdominal swelling, mass and lump: Secondary | ICD-10-CM | POA: Diagnosis not present

## 2020-04-27 DIAGNOSIS — Z1159 Encounter for screening for other viral diseases: Secondary | ICD-10-CM | POA: Diagnosis not present

## 2020-04-27 DIAGNOSIS — Z1152 Encounter for screening for COVID-19: Secondary | ICD-10-CM | POA: Diagnosis not present

## 2020-04-30 ENCOUNTER — Encounter: Payer: Self-pay | Admitting: Cardiology

## 2020-04-30 DIAGNOSIS — R911 Solitary pulmonary nodule: Secondary | ICD-10-CM | POA: Diagnosis not present

## 2020-04-30 DIAGNOSIS — J984 Other disorders of lung: Secondary | ICD-10-CM | POA: Diagnosis not present

## 2020-04-30 DIAGNOSIS — I700 Atherosclerosis of aorta: Secondary | ICD-10-CM | POA: Diagnosis not present

## 2020-04-30 DIAGNOSIS — I2510 Atherosclerotic heart disease of native coronary artery without angina pectoris: Secondary | ICD-10-CM | POA: Diagnosis not present

## 2020-04-30 DIAGNOSIS — R918 Other nonspecific abnormal finding of lung field: Secondary | ICD-10-CM | POA: Diagnosis not present

## 2020-05-03 DIAGNOSIS — Z7984 Long term (current) use of oral hypoglycemic drugs: Secondary | ICD-10-CM | POA: Diagnosis not present

## 2020-05-03 DIAGNOSIS — Z7901 Long term (current) use of anticoagulants: Secondary | ICD-10-CM | POA: Diagnosis not present

## 2020-05-03 DIAGNOSIS — D171 Benign lipomatous neoplasm of skin and subcutaneous tissue of trunk: Secondary | ICD-10-CM | POA: Diagnosis not present

## 2020-05-03 DIAGNOSIS — E669 Obesity, unspecified: Secondary | ICD-10-CM | POA: Diagnosis not present

## 2020-05-03 DIAGNOSIS — E039 Hypothyroidism, unspecified: Secondary | ICD-10-CM | POA: Diagnosis not present

## 2020-05-03 DIAGNOSIS — I4891 Unspecified atrial fibrillation: Secondary | ICD-10-CM | POA: Diagnosis not present

## 2020-05-03 DIAGNOSIS — I10 Essential (primary) hypertension: Secondary | ICD-10-CM | POA: Diagnosis not present

## 2020-05-03 DIAGNOSIS — F419 Anxiety disorder, unspecified: Secondary | ICD-10-CM | POA: Diagnosis not present

## 2020-05-03 DIAGNOSIS — Z9884 Bariatric surgery status: Secondary | ICD-10-CM | POA: Diagnosis not present

## 2020-05-03 DIAGNOSIS — F32A Depression, unspecified: Secondary | ICD-10-CM | POA: Diagnosis not present

## 2020-05-03 DIAGNOSIS — D214 Benign neoplasm of connective and other soft tissue of abdomen: Secondary | ICD-10-CM | POA: Diagnosis not present

## 2020-05-03 DIAGNOSIS — Z6830 Body mass index (BMI) 30.0-30.9, adult: Secondary | ICD-10-CM | POA: Diagnosis not present

## 2020-05-03 DIAGNOSIS — R222 Localized swelling, mass and lump, trunk: Secondary | ICD-10-CM | POA: Diagnosis not present

## 2020-05-03 DIAGNOSIS — E119 Type 2 diabetes mellitus without complications: Secondary | ICD-10-CM | POA: Diagnosis not present

## 2020-05-06 ENCOUNTER — Telehealth: Payer: Self-pay

## 2020-05-06 DIAGNOSIS — R0602 Shortness of breath: Secondary | ICD-10-CM

## 2020-05-06 NOTE — Telephone Encounter (Signed)
Per Dr. Bettina Gavia:  Re: [name] date of birth 1950/11/25, CT of the chest shows abnormality in the left lung suggesting chronic infection especially atypical Mycobacterium I think the best approach here is to refer her to pulmonary [facility] lobe our. Often these individuals need bronchoscopy and further evaluation.   Spoke with patient regarding results and recommendation.  Patient verbalizes understanding and is agreeable to plan of care. Advised patient to call back with any issues or concerns.

## 2020-05-11 ENCOUNTER — Other Ambulatory Visit: Payer: Self-pay | Admitting: Family Medicine

## 2020-05-11 DIAGNOSIS — Z09 Encounter for follow-up examination after completed treatment for conditions other than malignant neoplasm: Secondary | ICD-10-CM | POA: Diagnosis not present

## 2020-05-14 DIAGNOSIS — J0100 Acute maxillary sinusitis, unspecified: Secondary | ICD-10-CM | POA: Diagnosis not present

## 2020-05-14 DIAGNOSIS — H6990 Unspecified Eustachian tube disorder, unspecified ear: Secondary | ICD-10-CM | POA: Diagnosis not present

## 2020-05-14 DIAGNOSIS — J189 Pneumonia, unspecified organism: Secondary | ICD-10-CM | POA: Diagnosis not present

## 2020-05-17 ENCOUNTER — Telehealth: Payer: Self-pay | Admitting: Pulmonary Disease

## 2020-05-18 NOTE — Telephone Encounter (Signed)
Attempted to contact pt, line rang twice then went to a fast busy signal x2. Will try back.

## 2020-05-21 NOTE — Telephone Encounter (Signed)
Called and spoke to patient.  Patient is scheduled for consult with Dr. Vaughan Browner on 05/26/2020. She is requesting that we send a fax cover sheet to [place] requesting CT results.  Fax cover sheet has been faxed to [phone]. Nothing further needed.

## 2020-05-26 ENCOUNTER — Other Ambulatory Visit: Payer: Self-pay

## 2020-05-26 ENCOUNTER — Ambulatory Visit (INDEPENDENT_AMBULATORY_CARE_PROVIDER_SITE_OTHER): Payer: Medicare Other | Admitting: Pulmonary Disease

## 2020-05-26 ENCOUNTER — Encounter: Payer: Self-pay | Admitting: Pulmonary Disease

## 2020-05-26 VITALS — BP 138/80 | HR 74 | Temp 98.3°F | Ht 68.0 in | Wt 198.2 lb

## 2020-05-26 DIAGNOSIS — R0602 Shortness of breath: Secondary | ICD-10-CM | POA: Diagnosis not present

## 2020-05-26 DIAGNOSIS — R918 Other nonspecific abnormal finding of lung field: Secondary | ICD-10-CM | POA: Diagnosis not present

## 2020-05-26 LAB — CBC WITH DIFFERENTIAL/PLATELET
Basophils Absolute: 0.1 10*3/uL (ref 0.0–0.1)
Basophils Relative: 1.1 % (ref 0.0–3.0)
Eosinophils Absolute: 0.5 10*3/uL (ref 0.0–0.7)
Eosinophils Relative: 6 % — ABNORMAL HIGH (ref 0.0–5.0)
HCT: 33.9 % — ABNORMAL LOW (ref 36.0–46.0)
Hemoglobin: 10.9 g/dL — ABNORMAL LOW (ref 12.0–15.0)
Lymphocytes Relative: 40.6 % (ref 12.0–46.0)
Lymphs Abs: 3.1 10*3/uL (ref 0.7–4.0)
MCHC: 32.2 g/dL (ref 30.0–36.0)
MCV: 86.7 fl (ref 78.0–100.0)
Monocytes Absolute: 0.7 10*3/uL (ref 0.1–1.0)
Monocytes Relative: 9 % (ref 3.0–12.0)
Neutro Abs: 3.4 10*3/uL (ref 1.4–7.7)
Neutrophils Relative %: 43.3 % (ref 43.0–77.0)
Platelets: 352 10*3/uL (ref 150.0–400.0)
RBC: 3.92 Mil/uL (ref 3.87–5.11)
RDW: 14.9 % (ref 11.5–15.5)
WBC: 7.7 10*3/uL (ref 4.0–10.5)

## 2020-05-26 NOTE — Progress Notes (Signed)
April Munoz    [id number]    [birth year]  Primary Care Physician:Cox, Elnita Maxwell, MD  Referring Physician: Richardo Priest, MD [address] [place],  [place] [id number]  Chief complaint: Evaluation for abnormal CT  HPI: 69 year old with history of hypertension, atrial fibrillation, diabetes, stroke, allergic rhinitis Had a CT abdomen done earlier this year which showed mild tree-in-bud in the left lower lobe.  Follow-up CT this month showed persistent abnormalities then she has been referred for further evaluation  She has history of chronic sinusitis, allergic rhinitis, gastric bypass with GERD and Barrett's esophagus Follows with Dr. Sherron Monday GI at [facility] and is on a PPI  Has occasional cough, nonproductive in nature, chest congestion.  Denies any fevers, chills though on occasion she feels some low-grade temperature  Pets: Has a dog Occupation: Used to work for [employer] Exposures: No known exposures.  No mold, hot tub, Jacuzzi Smoking history: Never smoker Travel history: No significant travel history Relevant family history: Brother has lung cancer.  He was a smoker.   Outpatient Encounter Medications as of 05/26/2020  Medication Sig  . apixaban (ELIQUIS) 5 MG TABS tablet Take 1 tablet (5 mg total) by mouth 2 (two) times daily.  . B Complex-C-Folic Acid TABS Take 1 tablet by mouth daily.  . Biotin 1000 MCG tablet Take 1,000 mcg by mouth daily.  . Calcium-Vitamin D-Vitamin K (VIACTIV CALCIUM PLUS D) 650-12.5-40 MG-MCG-MCG CHEW Chew 1 capsule by mouth daily.  . cyanocobalamin (,VITAMIN B-12,) 1000 MCG/ML injection Inject 1 mL (1,000 mcg total) into the muscle every 30 (thirty) days.  Marland Kitchen EPINEPHrine 0.3 mg/0.3 mL IJ SOAJ injection Inject into the muscle as needed.  Marland Kitchen ibuprofen (ADVIL) 200 MG tablet Take 400 mg by mouth every 6 (six) hours as needed for headache or mild pain.   Marland Kitchen levothyroxine (SYNTHROID) 75 MCG tablet Take 1 tablet (75 mcg total) by mouth daily  before breakfast.  . losartan (COZAAR) 50 MG tablet TAKE ONE (1) TABLET BY MOUTH ONCE DAILY  . metFORMIN (GLUCOPHAGE) 1000 MG tablet TAKE ONE TABLET TWICE DAILY WITH MEALS  . metoprolol succinate (TOPROL XL) 25 MG 24 hr tablet Take 1 tablet (25 mg total) by mouth in the morning and at bedtime.  . Multiple Vitamin (MULTIVITAMIN WITH MINERALS) TABS tablet Take 1 tablet by mouth daily.  Marland Kitchen omeprazole (PRILOSEC) 20 MG capsule TAKE ONE (1) CAPSULE EACH DAY  . pravastatin (PRAVACHOL) 20 MG tablet Take 1 tablet (20 mg total) by mouth 4 (four) times a week. Sun, Tue, Thu, Sat  . Probiotic Product (PROBIOTIC & ACIDOPHILUS EX ST PO) Take 1 tablet by mouth daily.   Marland Kitchen torsemide (DEMADEX) 20 MG tablet Take 1 tablet (20 mg total) by mouth 3 (three) times a week. Please take this on Monday, Wednesday, and Friday.  . tretinoin (RETIN-A) 0.05 % cream Apply 1 application topically at bedtime.  Marland Kitchen venlafaxine XR (EFFEXOR-XR) 75 MG 24 hr capsule Take 1 capsule (75 mg total) by mouth daily.  . Vitamin D, Ergocalciferol, (DRISDOL) 1.25 MG (50000 UNIT) CAPS capsule Take 1 capsule (50,000 Units total) by mouth every Monday.  . Zinc Sulfate (ZINC 15 PO) Take 1 tablet by mouth daily.  Marland Kitchen zolpidem (AMBIEN) 5 MG tablet TAKE ONE TABLET BY MOUTH AT BEDTIME AS NEEDED.  Marland Kitchen acyclovir (ZOVIRAX) 800 MG tablet TAKE ONE (1) TABLET FOUR (4) TIMES DAILYAS NEEDED FOR SHINGLES (Patient not taking: Reported on 05/26/2020)   No facility-administered encounter  medications on file as of 05/26/2020.    Allergies as of 05/26/2020 - Review Complete 05/26/2020  Allergen Reaction Noted  . Prednisone  11/06/2013    Past Medical History:  Diagnosis Date  . Anxiety   . Arthritis   . Ataxic gait   . Atrial fibrillation ([place])   . Atrophy of thyroid   . B12 deficiency 10/31/2017  . Cellulitis of abdominal wall 07/07/2016  . Chronic anticoagulation 05/31/2018  . Depression   . Depression, major, recurrent, mild ([place]) 09/16/2019  . Diabetes  mellitus without complication ([place])   . Diabetic glomerulopathy ([place]) 09/16/2019  . Esophageal stricture 06/05/2017  . Essential hypertension 02/24/2013  . Fatigue 01/20/2020  . Fibromyalgia 02/24/2013  . Full dentures   . Hereditary and idiopathic peripheral neuropathy 02/24/2013  . High risk medication use 11/07/2017  . Hyperlipidemia 02/24/2013  . Hypertension   . Hypertensive heart disease 02/24/2013  . Incontinence   . Insomnia   . Longstanding persistent atrial fibrillation ([place]) 09/16/2019  . Mild vitamin D deficiency 09/16/2019  . Mixed hyperlipidemia 02/24/2013  . Murmur, cardiac 08/15/2015  . Neuropathy in diabetes ([place]) 10/31/2017  . Osteoporosis   . Other amnesia   . Other amnesia   . 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   . Paroxysmal atrial fibrillation ([place]) 08/15/2015   CHADS2vasc=3 CHADS2vasc=3  . Primary insomnia   . Restless legs syndrome 03/14/2013  . Secondary hypothyroidism 09/16/2019  . Thoracic or lumbosacral neuritis or radiculitis 02/24/2013  . Type 2 diabetes mellitus, without long-term current use of insulin ([place]) 02/24/2013  . Urge incontinence of urine 09/16/2019  . Wears glasses     Past Surgical History:  Procedure Laterality Date  . A-FLUTTER ABLATION N/A 07/30/2019   Procedure: A-FLUTTER ABLATION;  Surgeon: Constance Haw, MD;  Location: [place] [facility];  Service: Cardiovascular;  Laterality: N/A;  . ATRIAL FIBRILLATION ABLATION N/A 03/21/2019   Procedure: ATRIAL FIBRILLATION ABLATION;  Surgeon: Constance Haw, MD;  Location: [place] [facility];  Service: Cardiovascular;  Laterality: N/A;  . BREAST REDUCTION SURGERY  1983  . CATARACT EXTRACTION, BILATERAL  2018, 2019  . CHOLECYSTECTOMY  1971   open  . DIAGNOSTIC LAPAROSCOPY  2010   lysis of adhesions  . DILATION AND CURETTAGE OF UTERUS  2004  . KNEE ARTHROSCOPY  1998   left  . KNEE ARTHROSCOPY W/ LATERAL RETINACULAR REPAIR    . MASS EXCISION Left 11/11/2013   Procedure:  EXCISION MUCOID CYST LEFT INDEX FINGER/DEBRIDEMENT LEFT INDEX FINGER;  Surgeon: Wynonia Sours, MD;  Location: [place];  Service: Orthopedics;  Laterality: Left;  ANESTHESIA: IV REGIONAL/FAB  . SHOULDER ARTHROSCOPY W/ ROTATOR CUFF REPAIR  2007   left  . TRIGGER FINGER RELEASE Left 11/11/2013   Procedure: RELEASE A-1 PULLEY LEFT RING FINGER;  Surgeon: Wynonia Sours, MD;  Location: [place];  Service: Orthopedics;  Laterality: Left;  . UMBILICAL HERNIA REPAIR  2008, 2010    Family History  Problem Relation Age of Onset  . Heart failure Father   . Heart disease Brother   . Heart attack Brother   . Heart failure Paternal Grandfather   . Stroke Paternal Grandfather   . Atrial fibrillation Mother   . Heart attack Mother   . Diabetes Maternal Grandmother     Social History   Socioeconomic History  . Marital status: Married    Spouse name: Not on file  . Number of children: Not on  file  . Years of education: Not on file  . Highest education level: Not on file  Occupational History  . Occupation: retired  Tobacco Use  . Smoking status: Never Smoker  . Smokeless tobacco: Never Used  Vaping Use  . Vaping Use: Never used  Substance and Sexual Activity  . Alcohol use: Yes    Alcohol/week: 1.0 - 2.0 standard drink    Types: 1 - 2 Glasses of wine per week    Comment: 4 times a week  . Drug use: No  . Sexual activity: Not on file  Other Topics Concern  . Not on file  Social History Narrative  . Not on file   Social Determinants of Health   Financial Resource Strain:   . Difficulty of Paying Living Expenses: Not on file  Food Insecurity:   . Worried About Charity fundraiser in the Last Year: Not on file  . Ran Out of Food in the Last Year: Not on file  Transportation Needs:   . Lack of Transportation (Medical): Not on file  . Lack of Transportation (Non-Medical): Not on file  Physical Activity:   . Days of Exercise per Week: Not on file  .  Minutes of Exercise per Session: Not on file  Stress:   . Feeling of Stress : Not on file  Social Connections:   . Frequency of Communication with Friends and Family: Not on file  . Frequency of Social Gatherings with Friends and Family: Not on file  . Attends Religious Services: Not on file  . Active Member of Clubs or Organizations: Not on file  . Attends Archivist Meetings: Not on file  . Marital Status: Not on file  Intimate Partner Violence:   . Fear of Current or Ex-Partner: Not on file  . Emotionally Abused: Not on file  . Physically Abused: Not on file  . Sexually Abused: Not on file    Review of systems: Review of Systems  Constitutional: Negative for fever and chills.  HENT: Negative.   Eyes: Negative for blurred vision.  Respiratory: as per HPI  Cardiovascular: Negative for chest pain and palpitations.  Gastrointestinal: Negative for vomiting, diarrhea, blood per rectum. Genitourinary: Negative for dysuria, urgency, frequency and hematuria.  Musculoskeletal: Negative for myalgias, back pain and joint pain.  Skin: Negative for itching and rash.  Neurological: Negative for dizziness, tremors, focal weakness, seizures and loss of consciousness.  Endo/Heme/Allergies: Negative for environmental allergies.  Psychiatric/Behavioral: Negative for depression, suicidal ideas and hallucinations.  All other systems reviewed and are negative.  Physical Exam: Blood pressure 138/80, pulse 74, temperature 98.3 F (36.8 C), temperature source Skin, height 5\' 8"  (1.727 m), weight 198 lb 3.2 oz (89.9 kg), SpO2 97 %. Gen:      No acute distress HEENT:  EOMI, sclera anicteric Neck:     No masses; no thyromegaly Lungs:    Clear to auscultation bilaterally; normal respiratory effort CV:         Regular rate and rhythm; no murmurs Abd:      + bowel sounds; soft, non-tender; no palpable masses, no distension Ext:    No edema; adequate peripheral perfusion Skin:      Warm and  dry; no rash Neuro: alert and oriented x 3 Psych: normal mood and affect  Data Reviewed: Imaging: CT abdomen pelvis [name] 04/12/2020 -tree-in-bud opacity in the left lower lobe CT chest [place] 04/29/2020-persistent tree-in-bud opacity in the left lower lobe, 4 mm nodule in the  left apex. I have reviewed the images personally.  PFTs:  Labs: CBC 09/18/2019-WBC 7.1, eos 3%, absolute eosinophil count 213  Assessment:  Abnormal CT She has very minimal tree-in-bud opacities in the left lower lobe but stable from September 2021 The appearance may suggest MAI she does not have any infectious symptoms. Unable to produce sputum for culture.  Will defer bronchoscopy as would like to avoid an invasive procedure given minimal symptoms and imaging findings  Suspect that she has chronic aspiration secondary to GERD in the setting of Barrett's esophagitis that may be causing the issue.  Advised her to follow-up with her GI doctor regarding this and continue PPI.  Repeat CT in 3 months to reevaluate Get PFTs, CBC with differential and IgE given history of allergic rhinitis to evaluate for asthma  Plan/Recommendations: CT chest in 3 months PFTs, CBC with differential, IgE  Marshell Garfinkel MD [facility] 05/26/2020, 9:47 AM  CC: Richardo Priest, MD

## 2020-05-26 NOTE — Patient Instructions (Signed)
We will get labs today including CBC differential, IgE Schedule CT chest without contrast in 3 months  Schedule PFTs and follow-up in 3 months.

## 2020-05-27 ENCOUNTER — Other Ambulatory Visit: Payer: Self-pay

## 2020-05-27 ENCOUNTER — Encounter: Payer: Self-pay | Admitting: Family Medicine

## 2020-05-27 ENCOUNTER — Ambulatory Visit (INDEPENDENT_AMBULATORY_CARE_PROVIDER_SITE_OTHER): Payer: Medicare Other | Admitting: Family Medicine

## 2020-05-27 VITALS — BP 142/70 | HR 88 | Temp 97.2°F | Resp 18 | Ht 68.0 in | Wt 195.6 lb

## 2020-05-27 DIAGNOSIS — I110 Hypertensive heart disease with heart failure: Secondary | ICD-10-CM | POA: Diagnosis not present

## 2020-05-27 DIAGNOSIS — F331 Major depressive disorder, recurrent, moderate: Secondary | ICD-10-CM

## 2020-05-27 DIAGNOSIS — E538 Deficiency of other specified B group vitamins: Secondary | ICD-10-CM

## 2020-05-27 DIAGNOSIS — E663 Overweight: Secondary | ICD-10-CM | POA: Diagnosis not present

## 2020-05-27 DIAGNOSIS — E038 Other specified hypothyroidism: Secondary | ICD-10-CM | POA: Diagnosis not present

## 2020-05-27 DIAGNOSIS — Z6829 Body mass index (BMI) 29.0-29.9, adult: Secondary | ICD-10-CM | POA: Diagnosis not present

## 2020-05-27 DIAGNOSIS — I4811 Longstanding persistent atrial fibrillation: Secondary | ICD-10-CM

## 2020-05-27 DIAGNOSIS — E782 Mixed hyperlipidemia: Secondary | ICD-10-CM

## 2020-05-27 DIAGNOSIS — E1121 Type 2 diabetes mellitus with diabetic nephropathy: Secondary | ICD-10-CM

## 2020-05-27 DIAGNOSIS — E559 Vitamin D deficiency, unspecified: Secondary | ICD-10-CM

## 2020-05-27 DIAGNOSIS — F5101 Primary insomnia: Secondary | ICD-10-CM | POA: Diagnosis not present

## 2020-05-27 LAB — POCT UA - MICROALBUMIN: Microalbumin Ur, POC: 150 mg/L

## 2020-05-27 LAB — IGE: IgE (Immunoglobulin E), Serum: 429 kU/L — ABNORMAL HIGH (ref <=114)

## 2020-05-27 MED ORDER — CYANOCOBALAMIN 1000 MCG/ML IJ SOLN: 1000.0000 ug | INTRAMUSCULAR | 2 refills | Status: DC

## 2020-05-27 MED ORDER — OMEGA-3-ACID ETHYL ESTERS 1 G PO CAPS: 2.0000 g | ORAL_CAPSULE | Freq: Two times a day (BID) | ORAL | 1 refills | Status: DC

## 2020-05-27 MED ORDER — VENLAFAXINE HCL ER 150 MG PO CP24: 150.0000 mg | ORAL_CAPSULE | Freq: Every day | ORAL | 0 refills | Status: DC

## 2020-05-27 MED ORDER — LEVOTHYROXINE SODIUM 75 MCG PO TABS: 75.0000 ug | ORAL_TABLET | Freq: Every day | ORAL | 1 refills | Status: DC

## 2020-05-27 MED ORDER — ZOLPIDEM TARTRATE 10 MG PO TABS: 10.0000 mg | ORAL_TABLET | Freq: Every evening | ORAL | 2 refills | Status: DC | PRN

## 2020-05-27 MED ORDER — ATORVASTATIN CALCIUM 10 MG PO TABS: 10.0000 mg | ORAL_TABLET | Freq: Every day | ORAL | 0 refills | Status: DC

## 2020-05-27 MED ORDER — VITAMIN D (ERGOCALCIFEROL) 1.25 MG (50000 UNIT) PO CAPS: 50000.0000 [IU] | ORAL_CAPSULE | ORAL | 1 refills | Status: DC

## 2020-05-27 NOTE — Progress Notes (Signed)
+   Subjective:  Patient ID: April Munoz, female    DOB: 21-May-1951  Age: 69 y.o. MRN: [id number]  Chief Complaint  Patient presents with  . Diabetes  . Hypothyroidism    HPI Patient is a 69 year old white female in for follow-up of her hyperlipidemia, diabetes, hypertension, atrial fibrillation, reflux, and depression.  In terms of her hyperlipidemia she is taking Lipitor 10 mg taking 4 times per week and lovaz 2 gms twice a day. Marland Kitchen Has not tried other statins.   She tries to eat healthy following a low-fat diet.  Patient has diabetic nephropathy.  Her sugars are generally well controlled.  She was diagnosed at the age of 39.  Her last A1c was 7.2.  Sugars 120-140. She takes Metformin 1000 mg twice daily.  She has diagnosis of hypertension and atrial fibrillation.  She has had a significant cardiac work-up this year.  She had an ablation May 30, 2019.  She is currently on Toprol-XL 25 mg 1 p.o. twice daily, losartan 50 mg one daily, torsemide 20 mg one three times a week, and eliquis 5 mg one twice a day..  Prior to this ablation she had 2 falls in the previous 6 months.  Since that time she has had no falls.  Although she does not exercise she is trying to eat healthy.  Her blood pressure is well controlled.  Patient has acid reflux which is well controlled on omeprazole.  Patient has problems with depression and anxiety.  She is currently on venlafaxine 75 mg extended release in a.m.  She is feeling very depressed.  She has a history of TIAs but to my knowledge no strokes. Increased stress due to drama with adult children.    Patient had rt inguinal surgery to remove scar tissue from previous hernia surgeries. No problems recovering.   Hypothyroidism: taking synthroid 75 mcg once daily in am.  Current Outpatient Medications on File Prior to Visit  Medication Sig Dispense Refill  . acyclovir (ZOVIRAX) 800 MG tablet TAKE ONE (1) TABLET FOUR (4) TIMES DAILYAS NEEDED FOR SHINGLES 120  tablet 0  . apixaban (ELIQUIS) 5 MG TABS tablet Take 1 tablet (5 mg total) by mouth 2 (two) times daily. 180 tablet 1  . B Complex-C-Folic Acid TABS Take 1 tablet by mouth daily.    . Biotin 1000 MCG tablet Take 1,000 mcg by mouth daily.    . Calcium-Vitamin D-Vitamin K (VIACTIV CALCIUM PLUS D) 650-12.5-40 MG-MCG-MCG CHEW Chew 1 capsule by mouth daily.    Marland Kitchen EPINEPHrine 0.3 mg/0.3 mL IJ SOAJ injection Inject into the muscle as needed.    Marland Kitchen losartan (COZAAR) 50 MG tablet TAKE ONE (1) TABLET BY MOUTH ONCE DAILY 30 tablet 2  . metFORMIN (GLUCOPHAGE) 1000 MG tablet TAKE ONE TABLET TWICE DAILY WITH MEALS 90 tablet 0  . metoprolol succinate (TOPROL XL) 25 MG 24 hr tablet Take 1 tablet (25 mg total) by mouth in the morning and at bedtime. 60 tablet 11  . Multiple Vitamin (MULTIVITAMIN WITH MINERALS) TABS tablet Take 1 tablet by mouth daily.    Marland Kitchen omeprazole (PRILOSEC) 20 MG capsule TAKE ONE (1) CAPSULE EACH DAY 90 capsule 0  . Probiotic Product (PROBIOTIC & ACIDOPHILUS EX ST PO) Take 1 tablet by mouth daily.     Marland Kitchen torsemide (DEMADEX) 20 MG tablet Take 1 tablet (20 mg total) by mouth 3 (three) times a week. Please take this on Monday, Wednesday, and Friday. 90 tablet 0  . tretinoin (RETIN-A) 0.05 %  cream Apply 1 application topically at bedtime.    . Zinc Sulfate (ZINC 15 PO) Take 1 tablet by mouth daily.     No current facility-administered medications on file prior to visit.   Past Medical History:  Diagnosis Date  . Anxiety   . Arthritis   . Ataxic gait   . Atrial fibrillation ([place])   . Atrophy of thyroid   . B12 deficiency 10/31/2017  . Cellulitis of abdominal wall 07/07/2016  . Chronic anticoagulation 05/31/2018  . Depression   . Depression, major, recurrent, mild ([place]) 09/16/2019  . Diabetes mellitus without complication ([place])   . Diabetic glomerulopathy ([place]) 09/16/2019  . Esophageal stricture 06/05/2017  . Essential hypertension 02/24/2013  . Fatigue 01/20/2020  . Fibromyalgia 02/24/2013  .  Full dentures   . Hereditary and idiopathic peripheral neuropathy 02/24/2013  . High risk medication use 11/07/2017  . Hyperlipidemia 02/24/2013  . Hypertension   . Hypertensive heart disease 02/24/2013  . Incontinence   . Insomnia   . Longstanding persistent atrial fibrillation ([place]) 09/16/2019  . Mild vitamin D deficiency 09/16/2019  . Mixed hyperlipidemia 02/24/2013  . Murmur, cardiac 08/15/2015  . Neuropathy in diabetes ([place]) 10/31/2017  . Osteoporosis   . Other amnesia   . Other amnesia   . 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   . Paroxysmal atrial fibrillation ([place]) 08/15/2015   CHADS2vasc=3 CHADS2vasc=3  . Primary insomnia   . Restless legs syndrome 03/14/2013  . Secondary hypothyroidism 09/16/2019  . Thoracic or lumbosacral neuritis or radiculitis 02/24/2013  . Type 2 diabetes mellitus, without long-term current use of insulin ([place]) 02/24/2013  . Urge incontinence of urine 09/16/2019  . Wears glasses    Past Surgical History:  Procedure Laterality Date  . A-FLUTTER ABLATION N/A 07/30/2019   Procedure: A-FLUTTER ABLATION;  Surgeon: Constance Haw, MD;  Location: [facility];  Service: Cardiovascular;  Laterality: N/A;  . ATRIAL FIBRILLATION ABLATION N/A 03/21/2019   Procedure: ATRIAL FIBRILLATION ABLATION;  Surgeon: Constance Haw, MD;  Location: [facility];  Service: Cardiovascular;  Laterality: N/A;  . BREAST REDUCTION SURGERY  1983  . CATARACT EXTRACTION, BILATERAL  2018, 2019  . CHOLECYSTECTOMY  1971   open  . DIAGNOSTIC LAPAROSCOPY  2010   lysis of adhesions  . DILATION AND CURETTAGE OF UTERUS  2004  . KNEE ARTHROSCOPY  1998   left  . KNEE ARTHROSCOPY W/ LATERAL RETINACULAR REPAIR    . MASS EXCISION Left 11/11/2013   Procedure: EXCISION MUCOID CYST LEFT INDEX FINGER/DEBRIDEMENT LEFT INDEX FINGER;  Surgeon: Wynonia Sours, MD;  Location: [facility];  Service: Orthopedics;  Laterality: Left;  ANESTHESIA: IV REGIONAL/FAB   . SHOULDER ARTHROSCOPY W/ ROTATOR CUFF REPAIR  2007   left  . TRIGGER FINGER RELEASE Left 11/11/2013   Procedure: RELEASE A-1 PULLEY LEFT RING FINGER;  Surgeon: Wynonia Sours, MD;  Location: [facility];  Service: Orthopedics;  Laterality: Left;  . UMBILICAL HERNIA REPAIR  2008, 2010    Family History  Problem Relation Age of Onset  . Heart failure Father   . Heart disease Brother   . Heart attack Brother   . Heart failure Paternal Grandfather   . Stroke Paternal Grandfather   . Atrial fibrillation Mother   . Heart attack Mother   . Diabetes Maternal Grandmother    Social History   Socioeconomic History  . Marital status: Married    Spouse name: Not on file  .  Number of children: Not on file  . Years of education: Not on file  . Highest education level: Not on file  Occupational History  . Occupation: retired  Tobacco Use  . Smoking status: Never Smoker  . Smokeless tobacco: Never Used  Vaping Use  . Vaping Use: Never used  Substance and Sexual Activity  . Alcohol use: Yes    Alcohol/week: 1.0 - 2.0 standard drink    Types: 1 - 2 Glasses of wine per week    Comment: 4 times a week  . Drug use: No  . Sexual activity: Not on file  Other Topics Concern  . Not on file  Social History Narrative  . Not on file   Social Determinants of Health   Financial Resource Strain:   . Difficulty of Paying Living Expenses: Not on file  Food Insecurity:   . Worried About Charity fundraiser in the Last Year: Not on file  . Ran Out of Food in the Last Year: Not on file  Transportation Needs:   . Lack of Transportation (Medical): Not on file  . Lack of Transportation (Non-Medical): Not on file  Physical Activity:   . Days of Exercise per Week: Not on file  . Minutes of Exercise per Session: Not on file  Stress:   . Feeling of Stress : Not on file  Social Connections:   . Frequency of Communication with Friends and Family: Not on file  . Frequency of Social  Gatherings with Friends and Family: Not on file  . Attends Religious Services: Not on file  . Active Member of Clubs or Organizations: Not on file  . Attends Archivist Meetings: Not on file  . Marital Status: Not on file    Review of Systems  Constitutional: Negative for chills, fatigue and fever.  HENT: Positive for congestion (allergies). Negative for rhinorrhea and sore throat.   Respiratory: Positive for cough and shortness of breath.   Cardiovascular: Negative for chest pain and palpitations.  Gastrointestinal: Positive for constipation (taking benefiber and miralax. ) and diarrhea (sometimes.). Negative for abdominal pain, nausea and vomiting.  Endocrine: Positive for polyphagia. Negative for polydipsia and polyuria.  Genitourinary: Negative for dysuria and urgency.  Musculoskeletal: Negative for back pain and myalgias.  Neurological: Positive for weakness. Negative for dizziness, light-headedness and headaches.  Psychiatric/Behavioral: Positive for dysphoric mood. The patient is not nervous/anxious.      Objective:  BP (!) 142/70   Pulse 88   Temp (!) 97.2 F (36.2 C)   Resp 18   Ht 5\' 8"  (1.727 m)   Wt 195 lb 9.6 oz (88.7 kg)   BMI 29.74 kg/m   BP/Weight 05/27/2020 05/26/2020 50/27/7412  Systolic BP 878 676 720  Diastolic BP 70 80 82  Wt. (Lbs) 195.6 198.2 195.2  BMI 29.74 30.14 29.68    Physical Exam Vitals reviewed.  Constitutional:      Appearance: Normal appearance. She is normal weight.  Neck:     Vascular: No carotid bruit.  Cardiovascular:     Rate and Rhythm: Normal rate and regular rhythm.     Pulses: Normal pulses.     Heart sounds: Normal heart sounds.  Pulmonary:     Effort: Pulmonary effort is normal. No respiratory distress.     Breath sounds: Normal breath sounds.  Abdominal:     General: Abdomen is flat. Bowel sounds are normal.     Palpations: Abdomen is soft.     Tenderness: There  is no abdominal tenderness.  Neurological:       Mental Status: She is alert and oriented to person, place, and time.  Psychiatric:        Mood and Affect: Mood normal.        Behavior: Behavior normal.     Diabetic Foot Exam - Simple   Simple Foot Form Diabetic Foot exam was performed with the following findings: Yes 05/27/2020  9:22 AM  Visual Inspection No deformities, no ulcerations, no other skin breakdown bilaterally: Yes Sensation Testing See comments: Yes Pulse Check Posterior Tibialis and Dorsalis pulse intact bilaterally: Yes Comments Entire BL feet numbness on bottom.       Lab Results  Component Value Date   WBC 7.7 05/26/2020   HGB 10.9 (L) 05/26/2020   HCT 33.9 (L) 05/26/2020   PLT 352.0 05/26/2020   GLUCOSE 122 (H) 01/27/2020   CHOL 231 (H) 02/26/2020   TRIG 189 (H) 02/26/2020   HDL 67 02/26/2020   LDLCALC 131 (H) 02/26/2020   ALT 18 01/27/2020   AST 17 01/27/2020   NA 138 01/27/2020   K 5.0 01/27/2020   CL 102 01/27/2020   CREATININE 0.91 01/27/2020   BUN 19 01/27/2020   CO2 25 01/27/2020   TSH 3.240 02/26/2020   HGBA1C 7.2 (H) 01/26/2020   MICROALBUR 150 09/18/2019      Assessment & Plan:  1. Diabetic glomerulopathy (HCC) Control: good. Recommend check sugars fasting daily. Recommend check feet daily. Recommend annual eye exams. Medicines: no change Continue to work on eating a healthy diet and exercise.  Labs drawn today.   - POCT UA - Microalbumin - Ambulatory referral to Chronic Care Management Services  2. Secondary hypothyroidism - TSH; Future - levothyroxine (SYNTHROID) 75 MCG tablet; Take 1 tablet (75 mcg total) by mouth daily before breakfast.  Dispense: 90 tablet; Refill: 1  3. Hypertensive heart disease with congestive heart failure, unspecified heart failure type ([place]) Well controlled.  No changes to medicines.  Continue to work on eating a healthy diet and exercise.  Labs drawn today.  - Comprehensive metabolic panel; Future  4. Mixed hyperlipidemia Well  controlled.  No changes to medicines.  Continue to work on eating a healthy diet and exercise.  Labs drawn today.  - atorvastatin (LIPITOR) 10 MG tablet; Take 1 tablet (10 mg total) by mouth daily.  Dispense: 90 tablet; Refill: 0 - Lipid panel; Future - Ambulatory referral to Chronic Care Management Services  5. Longstanding persistent atrial fibrillation (HCC) The current medical regimen is effective;  continue present plan and medications. - Ambulatory referral to Chronic Care Management Services  6. Mild vitamin D deficiency - Vitamin D level, future. - Methylmalonic acid, serum; Future  7. B12 deficiency - Vitamin B12; Future - cyanocobalamin (,VITAMIN B-12,) 1000 MCG/ML injection; Inject 1 mL (1,000 mcg total) into the muscle every 30 (thirty) days.  Dispense: 1 mL; Refill: 2  8. Primary insomnia Poorly controlled. Increase ambien to 10 mg once daily. - zolpidem (AMBIEN) 10 MG tablet; Take 1 tablet (10 mg total) by mouth at bedtime as needed for sleep.  Dispense: 30 tablet; Refill: 2  9. BMI 29.0-29.9,adult Recommend continue to work on eating healthy diet and exercise.  10. Overweight Recommend continue to work on eating healthy diet and exercise.    Meds ordered this encounter  Medications  . atorvastatin (LIPITOR) 10 MG tablet    Sig: Take 1 tablet (10 mg total) by mouth daily.    Dispense:  90 tablet    Refill:  0  . venlafaxine XR (EFFEXOR-XR) 150 MG 24 hr capsule    Sig: Take 1 capsule (150 mg total) by mouth daily with breakfast.    Dispense:  90 capsule    Refill:  0  . zolpidem (AMBIEN) 10 MG tablet    Sig: Take 1 tablet (10 mg total) by mouth at bedtime as needed for sleep.    Dispense:  30 tablet    Refill:  2  . cyanocobalamin (,VITAMIN B-12,) 1000 MCG/ML injection    Sig: Inject 1 mL (1,000 mcg total) into the muscle every 30 (thirty) days.    Dispense:  1 mL    Refill:  2  . levothyroxine (SYNTHROID) 75 MCG tablet    Sig: Take 1 tablet (75 mcg  total) by mouth daily before breakfast.    Dispense:  90 tablet    Refill:  1  . Vitamin D, Ergocalciferol, (DRISDOL) 1.25 MG (50000 UNIT) CAPS capsule    Sig: Take 1 capsule (50,000 Units total) by mouth every Monday.    Dispense:  12 capsule    Refill:  1  . omega-3 acid ethyl esters (LOVAZA) 1 g capsule    Sig: Take 2 capsules (2 g total) by mouth 2 (two) times daily.    Dispense:  180 capsule    Refill:  1    Orders Placed This Encounter  Procedures  . Vitamin B12  . TSH  . Comprehensive metabolic panel  . Lipid panel  . Methylmalonic acid, serum  . Ambulatory referral to Chronic Care Management Services  . POCT UA - Microalbumin    Time spent:30 minutes. Discussed depression/insomnia in depth.   I spent 30 minutes dedicated to the care of this patient on the date of this encounter to include face-to-face time with the patient, as well as obtaining and/or reviewing separately obtained history and specialists notes, performing a medically appropriate examination and/or evaluation, ordering medications, tests, or procedures, documenting clinical information in the electronic or other health record.  My nursing staff have aided in the documentation of this note on the behalf of [name], MD,as directed by  [name], MD and thoroughly reviewed by [name], MD.  Follow-up: Return in about 4 days (around 05/31/2020) for Labs. needs 3 month follow up fasting. Marland Kitchen  An After Visit Summary was printed and given to the patient.  [name] [facility] [phone]

## 2020-05-27 NOTE — Patient Instructions (Addendum)
Changes made: Hyperlipidemia: Change pravastatin to Lipitor 10 mg daily at night.  Continue Lovaza but increase to 2 capsules twice daily. Depression: Uncontrolled.  Recommend increase venlafaxine to 150 mg once daily in a.m.  (May take 75 mg 2 in a.m. until remaining prescription is gone.) B12 deficiency: Continue B12 shots monthly.  Patient may come here to get her B12 shots just needs to make an appointment and bring her shot with her. Insomnia: Increase Ambien to 10 mg once at night. If patient has headaches she has to take Tylenol.  No anti-inflammatories such as ibuprofen or Aleve.  Refer to chronic care management pharmacist, Lysle Rubens, PhD.  Patient Is to return this coming week for lab work as she is 2 days early to get her cholesterol checked.

## 2020-05-28 ENCOUNTER — Telehealth: Payer: Self-pay | Admitting: Family Medicine

## 2020-05-28 NOTE — Progress Notes (Signed)
  Chronic Care Management   Outreach Note  05/28/2020 Name: April Munoz MRN: [id number] DOB: 02/12/1951  Referred by: [name], MD Reason for referral : Chronic Care Management   An unsuccessful telephone outreach was attempted today. The patient was referred to the pharmacist for assistance with care management and care coordination.   Follow Up Plan:   Hilario Quarry  Upstream Scheduler

## 2020-05-31 ENCOUNTER — Telehealth: Payer: Self-pay | Admitting: Family Medicine

## 2020-05-31 ENCOUNTER — Other Ambulatory Visit: Payer: Medicare Other

## 2020-05-31 DIAGNOSIS — E538 Deficiency of other specified B group vitamins: Secondary | ICD-10-CM

## 2020-05-31 DIAGNOSIS — E782 Mixed hyperlipidemia: Secondary | ICD-10-CM | POA: Diagnosis not present

## 2020-05-31 DIAGNOSIS — I110 Hypertensive heart disease with heart failure: Secondary | ICD-10-CM | POA: Diagnosis not present

## 2020-05-31 DIAGNOSIS — E559 Vitamin D deficiency, unspecified: Secondary | ICD-10-CM | POA: Diagnosis not present

## 2020-05-31 DIAGNOSIS — E038 Other specified hypothyroidism: Secondary | ICD-10-CM | POA: Diagnosis not present

## 2020-05-31 NOTE — Chronic Care Management (AMB) (Signed)
  Chronic Care Management   Note  05/31/2020 Name: April Munoz MRN: [id number] DOB: June 23, 1951  April Munoz is a 69 y.o. year old female who is a primary care patient of Cox, Kirsten, MD. I reached out to [name] by phone today in response to a referral sent by [name] PCP, Cox, Kirsten, MD.   [name] was given information about Chronic Care Management services today including:  1. CCM service includes personalized support from designated clinical staff supervised by her physician, including individualized plan of care and coordination with other care providers 2. 24/7 contact phone numbers for assistance for urgent and routine care needs. 3. Service will only be billed when office clinical staff spend 20 minutes or more in a month to coordinate care. 4. Only one practitioner may furnish and bill the service in a calendar month. 5. The patient may stop CCM services at any time (effective at the end of the month) by phone call to the office staff.   Patient agreed to services and verbal consent obtained.   Follow up plan:   [place]

## 2020-06-01 ENCOUNTER — Other Ambulatory Visit: Payer: Medicare Other

## 2020-06-02 DIAGNOSIS — K2270 Barrett's esophagus without dysplasia: Secondary | ICD-10-CM | POA: Diagnosis not present

## 2020-06-02 DIAGNOSIS — R1013 Epigastric pain: Secondary | ICD-10-CM | POA: Diagnosis not present

## 2020-06-04 LAB — VITAMIN B12: Vitamin B-12: 550 pg/mL (ref 232–1245)

## 2020-06-04 LAB — COMPREHENSIVE METABOLIC PANEL
ALT: 29 IU/L (ref 0–32)
AST: 17 IU/L (ref 0–40)
Albumin/Globulin Ratio: 1.8 (ref 1.2–2.2)
Albumin: 4.1 g/dL (ref 3.7–4.7)
Alkaline Phosphatase: 68 IU/L (ref 44–121)
BUN/Creatinine Ratio: 14 (ref 12–28)
BUN: 12 mg/dL (ref 8–27)
Bilirubin Total: 0.3 mg/dL (ref 0.0–1.2)
CO2: 23 mmol/L (ref 20–29)
Calcium: 8.6 mg/dL — ABNORMAL LOW (ref 8.7–10.3)
Chloride: 103 mmol/L (ref 96–106)
Creatinine, Ser: 0.85 mg/dL (ref 0.57–1.00)
GFR calc Af Amer: 80 mL/min/{1.73_m2} (ref >59)
GFR calc non Af Amer: 69 mL/min/{1.73_m2} (ref >59)
Globulin, Total: 2.3 g/dL (ref 1.5–4.5)
Glucose: 136 mg/dL — ABNORMAL HIGH (ref 65–99)
Potassium: 5.1 mmol/L (ref 3.5–5.2)
Sodium: 141 mmol/L (ref 134–144)
Total Protein: 6.4 g/dL (ref 6.0–8.5)

## 2020-06-04 LAB — LIPID PANEL
Chol/HDL Ratio: 2.9 ratio (ref 0.0–4.4)
Cholesterol, Total: 211 mg/dL — ABNORMAL HIGH (ref 100–199)
HDL: 72 mg/dL (ref >39)
LDL Chol Calc (NIH): 107 mg/dL — ABNORMAL HIGH (ref 0–99)
Triglycerides: 187 mg/dL — ABNORMAL HIGH (ref 0–149)
VLDL Cholesterol Cal: 32 mg/dL (ref 5–40)

## 2020-06-04 LAB — VITAMIN D 25 HYDROXY (VIT D DEFICIENCY, FRACTURES): Vit D, 25-Hydroxy: 34.4 ng/mL (ref 30.0–100.0)

## 2020-06-04 LAB — METHYLMALONIC ACID, SERUM: Methylmalonic Acid: 228 nmol/L (ref 0–378)

## 2020-06-04 LAB — CARDIOVASCULAR RISK ASSESSMENT

## 2020-06-04 LAB — TSH: TSH: 5.25 u[IU]/mL — ABNORMAL HIGH (ref 0.450–4.500)

## 2020-06-07 ENCOUNTER — Other Ambulatory Visit: Payer: Self-pay

## 2020-06-07 DIAGNOSIS — E038 Other specified hypothyroidism: Secondary | ICD-10-CM

## 2020-06-07 MED ORDER — LEVOTHYROXINE SODIUM 88 MCG PO TABS: 88.0000 ug | ORAL_TABLET | Freq: Every day | ORAL | 0 refills | Status: DC

## 2020-06-15 ENCOUNTER — Other Ambulatory Visit: Payer: Self-pay | Admitting: Physician Assistant

## 2020-07-14 ENCOUNTER — Other Ambulatory Visit: Payer: Self-pay | Admitting: Physician Assistant

## 2020-07-22 ENCOUNTER — Telehealth: Payer: Self-pay

## 2020-07-22 ENCOUNTER — Other Ambulatory Visit: Payer: Self-pay | Admitting: Cardiology

## 2020-07-22 NOTE — Chronic Care Management (AMB) (Unsigned)
Chronic Care Management Pharmacy  Name: April Munoz  MRN: [id number] DOB: 01-08-1951  Chief Complaint/ HPI  April Munoz,  70 y.o. , female presents for their Initial CCM visit with the clinical pharmacist via telephone due to COVID-19 Pandemic.  PCP : April Brome, MD  Their chronic conditions include: hypertension, neuropathy in diabetes, diabetic glomerulopathy, hypothyroidism, osteoporosis, hypertensive heart disease, afib, fibromyalgia, hyperlipidemia, b12 deficiency, RLS, depression, mild vitamin D deficiency, anxiety, incontinence.   Office Visits:*** 05/27/2020 - atorvastatin 10 mg daily. Refer to CCM. Increase levothyroxine 88 mcg daily. Continue vitamin D supplement.  01/26/2020 - levothyroxine 75 mcg. Recheck TSH in 6 weeks. Referral to psychology. Vitamin B12 injection ordered.  Consult Visit:*** 05/26/2020 - repeat chest CT. Follow-up with GI and continue PPI.  04/26/2020 - cardiology - take diuretic 3 days a week. CT ordered. Continue anticoagulant. Resume pravastatin.  Medications: Outpatient Encounter Medications as of 07/23/2020  Medication Sig  . acyclovir (ZOVIRAX) 800 MG tablet TAKE ONE (1) TABLET FOUR (4) TIMES DAILYAS NEEDED FOR SHINGLES  . apixaban (ELIQUIS) 5 MG TABS tablet Take 1 tablet (5 mg total) by mouth 2 (two) times daily.  Marland Kitchen atorvastatin (LIPITOR) 10 MG tablet Take 1 tablet (10 mg total) by mouth daily.  . B Complex-C-Folic Acid TABS Take 1 tablet by mouth daily.  . Biotin 1000 MCG tablet Take 1,000 mcg by mouth daily.  . Calcium-Vitamin D-Vitamin K (VIACTIV CALCIUM PLUS D) 650-12.5-40 MG-MCG-MCG CHEW Chew 1 capsule by mouth daily.  . cyanocobalamin (,VITAMIN B-12,) 1000 MCG/ML injection Inject 1 mL (1,000 mcg total) into the muscle every 30 (thirty) days.  Marland Kitchen EPINEPHrine 0.3 mg/0.3 mL IJ SOAJ injection Inject into the muscle as needed.  Marland Kitchen levothyroxine (SYNTHROID) 88 MCG tablet Take 1 tablet (88 mcg total) by mouth daily before breakfast.  . losartan  (COZAAR) 50 MG tablet TAKE ONE (1) TABLET BY MOUTH ONCE DAILY  . metFORMIN (GLUCOPHAGE) 1000 MG tablet TAKE ONE TABLET TWICE DAILY WITH MEALS  . metoprolol succinate (TOPROL XL) 25 MG 24 hr tablet Take 1 tablet (25 mg total) by mouth in the morning and at bedtime.  . Multiple Vitamin (MULTIVITAMIN WITH MINERALS) TABS tablet Take 1 tablet by mouth daily.  Marland Kitchen omega-3 acid ethyl esters (LOVAZA) 1 g capsule Take 2 capsules (2 g total) by mouth 2 (two) times daily.  Marland Kitchen omeprazole (PRILOSEC) 20 MG capsule TAKE ONE (1) CAPSULE EACH DAY  . Probiotic Product (PROBIOTIC & ACIDOPHILUS EX ST PO) Take 1 tablet by mouth daily.   Marland Kitchen torsemide (DEMADEX) 20 MG tablet Take 1 tablet (20 mg total) by mouth 3 (three) times a week. Please take this on Monday, Wednesday, and Friday.  . tretinoin (RETIN-A) 0.05 % cream Apply 1 application topically at bedtime.  Marland Kitchen venlafaxine XR (EFFEXOR-XR) 150 MG 24 hr capsule Take 1 capsule (150 mg total) by mouth daily with breakfast.  . Vitamin D, Ergocalciferol, (DRISDOL) 1.25 MG (50000 UNIT) CAPS capsule Take 1 capsule (50,000 Units total) by mouth every Monday.  . Zinc Sulfate (ZINC 15 PO) Take 1 tablet by mouth daily.  Marland Kitchen zolpidem (AMBIEN) 10 MG tablet Take 1 tablet (10 mg total) by mouth at bedtime as needed for sleep.   No facility-administered encounter medications on file as of 07/23/2020.   Allergies  Allergen Reactions  . Prednisone     In high doses causes her to feel crazy   SDOH Screenings   Alcohol Screen: Not on file  Depression (PHQ2-9): Medium Risk  . PHQ-2  Score: 14  Financial Resource Strain: Not on file  Food Insecurity: Not on file  Housing: Not on file  Physical Activity: Not on file  Social Connections: Not on file  Stress: Not on file  Tobacco Use: Low Risk   . Smoking Tobacco Use: Never Smoker  . Smokeless Tobacco Use: Never Used  Transportation Needs: Not on file     Current Diagnosis/Assessment:  Goals Addressed   None     AFIB    Patient is currently {CHL HP BP RATE/RHYTHM:[phone]} controlled. HR *** BPM  Patient has failed these meds in past: Bystolic, Xarelto Patient is currently {CHL Controlled/Uncontrolled:[phone]} on the following medications: ***  Metoprolol succinate 25 mg am and bedtime  Eliquis 5 mg bid   {This patient does not have a recorded CHADS2-VASc score.   Click here to calculate score.   Then [facility] your note.       :[id number]}  CHA2DS2-VASc Score = 4  This indicates a 4.8% annual risk of stroke. The patient's score is based upon: CHF History: No HTN History: Yes Diabetes History: Yes Stroke History: No Vascular Disease History: No Age Score: 1 Gender Score: 1   {FYI    This patient has a significant risk of stroke if diagnosed with atrial fibrillation.  Please consider VKA or DOAC agent for anticoagulation if the bleeding risk is acceptable.  You can also use the SmartPhrase .Pablo for documentation in the A&P of your note.   :[id number]}   We discussed:  {CHL HP Upstream Pharmacy [phone]}  Plan  Continue {CHL HP Upstream Pharmacy Plans:[phone]}  ,  Diabetes   Recent Relevant Labs: Lab Results  Component Value Date/Time   HGBA1C 7.2 (H) 01/26/2020 09:54 AM   HGBA1C 6.7 (H) 09/18/2019 09:42 AM   MICROALBUR 150 05/27/2020 09:39 AM   MICROALBUR 150 09/18/2019 01:25 PM    Lab Results  Component Value Date   CREATININE 0.85 05/31/2020   BUN 12 05/31/2020   GFRNONAA 69 05/31/2020   GFRAA 80 05/31/2020   NA 141 05/31/2020   K 5.1 05/31/2020   CALCIUM 8.6 (L) 05/31/2020   CO2 23 05/31/2020     Checking BG: {CHL HP Blood Glucose Monitoring Frequency:[phone]}  Recent FBG Readings: Recent pre-meal BG readings: *** Recent 2hr PP BG readings:  *** Recent HS BG readings: *** Patient has failed these meds in past: *** Patient is currently {CHL Controlled/Uncontrolled:[phone]} on the following medications: ***  Metformin 1000 mg bid  with meals  Last diabetic Foot exam: No results found for: HMDIABEYEEXA  Last diabetic Eye exam: No results found for: HMDIABFOOTEX   We discussed: {CHL HP Upstream Pharmacy [phone]}  Plan  Continue {CHL HP Upstream Pharmacy Plans:[phone]} and  Hypertensive Heart Disease   BP today is:  {CHL HP UPSTREAM Pharmacist BP ranges:[phone]}  Office blood pressures are  BP Readings from Last 3 Encounters:  05/27/20 (!) 142/70  05/26/20 138/80  04/26/20 135/82    Patient has failed these meds in the past: ***furosemide,  Patient is currently controlled/uncontrolled*** on the following medications: ***  Losartan 50 mg daily   Metoprolol succinate 25 mg am and bedtime  Torsemide 20 mg three times weekly M/W/F  Patient checks BP at home {CHL HP BP Monitoring [phone]}  Patient home BP readings are ranging: ***  We discussed {CHL HP Upstream Pharmacy [phone]}  Plan  Continue {CHL HP Upstream Pharmacy Plans:[phone]}     Hyperlipidemia   LDL goal < ***  Last lipids Lab Results  Component Value Date   CHOL 211 (H) 05/31/2020   HDL 72 05/31/2020   LDLCALC 107 (H) 05/31/2020   TRIG 187 (H) 05/31/2020   CHOLHDL 2.9 05/31/2020   Hepatic Function Latest Ref Rng & Units 05/31/2020 01/27/2020 01/26/2020  Total Protein 6.0 - 8.5 g/dL 6.4 6.7 6.4  Albumin 3.7 - 4.7 g/dL 4.1 4.5 4.3  AST 0 - 40 IU/L '17 17 19  ' ALT 0 - 32 IU/L '29 18 20  ' Alk Phosphatase 44 - 121 IU/L 68 66 63  Total Bilirubin 0.0 - 1.2 mg/dL 0.3 0.3 0.3     The 10-year ASCVD risk score Mikey Bussing DC Jr., et al., 2013) is: 29.5%   Values used to calculate the score:     Age: 12 years     Sex: Female     Is Non-Hispanic African American: No     Diabetic: Yes     Tobacco smoker: No     Systolic Blood Pressure: 203 mmHg     Is BP treated: Yes     HDL Cholesterol: 72 mg/dL     Total Cholesterol: 211 mg/dL   Patient has failed these meds in past: ***pravstatin Patient  is currently {CHL Controlled/[phone]} on the following medications:  . ***atorvastatin 10 mg daily  . Lovaza 1 gram - 2 capsules bid   We discussed:  {CHL HP Upstream Pharmacy discussion:[phone]}  Plan  Continue {CHL HP Upstream Pharmacy Plans:[phone]}   Hypothyroidism   Lab Results  Component Value Date/Time   TSH 5.250 (H) 05/31/2020 09:18 AM   TSH 3.240 02/26/2020 08:16 AM    Patient has failed these meds in past: *** Patient is currently {CHL Controlled/[phone]} on the following medications:  . ***levothyroxine 88 mcg daily before breakfast  We discussed:  {CHL HP Upstream Pharmacy discussion:[phone]}  Plan  Continue {CHL HP Upstream Pharmacy Plans:[phone]}   Osteopenia / Osteoporosis   Last DEXA Scan: ***   T-Score femoral neck: ***  T-Score total hip: ***  T-Score lumbar spine: ***  T-Score forearm radius: ***  10-year probability of major osteoporotic fracture: ***  10-year probability of hip fracture: ***  Vit D, 25-Hydroxy  Date Value Ref Range Status  05/31/2020 34.4 30.0 - 100.0 ng/mL Final    Comment:    Vitamin D deficiency has been defined by the Institute of Medicine and an Endocrine Society practice guideline as a level of serum 25-OH vitamin D less than 20 ng/mL (1,2). The Endocrine Society went on to further define vitamin D insufficiency as a level between 21 and 29 ng/mL (2). 1. IOM (Institute of Medicine). 2010. Dietary reference    intakes for calcium and D. [place]: The    [employer]. 2. Holick MF, Binkley Perry, Bischoff-Ferrari HA, et al.    Evaluation, treatment, and prevention of vitamin D    deficiency: an Endocrine Society clinical practice    guideline. JCEM. 2011 Jul; 96(7):1911-30.      Patient {is;is not an osteoporosis candidate:23886}  Patient has failed these meds in past: ***alendronate  Patient is currently {CHL Controlled/[phone]} on the  following medications:  . ***Calcium-Vitamin D-Vitamin K daily  . Vitamin D 50,000 units weekly on Monday  We discussed:  {Osteoporosis Counseling:23892}  Plan  Continue {CHL HP Upstream Pharmacy TDHRC:[id number]}   ***Depression/Anxiety   Patient has failed these meds in past: ***alprazolam, diazepam, duloxetine, venlafaxine,  Patient is currently {CHL Controlled/[phone]} on the following medications:  . ***  We discussed:  ***  Plan  Continue {CHL HP Upstream Pharmacy [phone]}  ***GERD   Patient has failed these meds in past: *** Patient is currently {CHL Controlled/Uncontrolled:[phone]} on the following medications:  . ***omeprazole 20 mg daily  . Probiotic daily   We discussed:  ***  Plan  Continue {CHL HP Upstream Pharmacy [phone]}    Health Maintenance   Patient is currently {CHL Controlled/Uncontrolled:[phone]} on the following medications:  . ***Epipen as needed . Zolpidem 10 mg qhs prn sleep  . Vitamin B12 1000 mcg/ml every 30 days  . Bcomplex-C-Folic Acid daily *** . Biotin 1000 mcg daily *** . Multiplevitamin daily  . Zinc sulfate daily  . Tretinoin 0.05% daily at bedtime .   We discussed:  ***  Plan  Continue {CHL HP Upstream Pharmacy UYEBX:[id number]}  Vaccines   Reviewed and discussed patient's vaccination history.  TDAP? Shingles?  Immunization History  Administered Date(s) Administered  . Fluad Quad(high Dose 65+) 03/18/2019, 04/25/2020  . Influenza-Unspecified 03/18/2019, 05/14/2020  . Moderna Sars-Covid-2 Vaccination 09/09/2019, 10/04/2019, 04/19/2020  . Pneumococcal Conjugate-13 05/14/2015  . Pneumococcal Polysaccharide-23 05/16/2013, 07/27/2017    Plan  Recommended patient receive *** vaccine in *** office.   Medication Management   Patient's preferred pharmacy is:  [place], [place] [address] Phone: [phone]  Fax: [phone]  Uses pill box? {Yes or If no, why not?:20788} Pt endorses ***% compliance  We discussed: {Pharmacy options:24294}  Plan  {US Pharmacy PNPY:05110}    Follow up: *** month phone visit  ***

## 2020-07-22 NOTE — Chronic Care Management (AMB) (Signed)
Chronic Care Management Pharmacy Assistant   Name: April GerlachLinda Munoz  MRN: [id number] DOB: 10/28/1950  Reason for Encounter: Disease State call for initial visit Patient cancelled appt  PCP : Blane Oharaox, Kirsten, MD  Allergies:   Allergies  Allergen Reactions   Prednisone     In high doses causes her to feel crazy    Medications: Outpatient Encounter Medications as of 07/22/2020  Medication Sig   acyclovir (ZOVIRAX) 800 MG tablet TAKE ONE (1) TABLET FOUR (4) TIMES DAILYAS NEEDED FOR SHINGLES   apixaban (ELIQUIS) 5 MG TABS tablet Take 1 tablet (5 mg total) by mouth 2 (two) times daily.   atorvastatin (LIPITOR) 10 MG tablet Take 1 tablet (10 mg total) by mouth daily.   B Complex-C-Folic Acid TABS Take 1 tablet by mouth daily.   Biotin 1000 MCG tablet Take 1,000 mcg by mouth daily.   Calcium-Vitamin D-Vitamin K (VIACTIV CALCIUM PLUS D) 650-12.5-40 MG-MCG-MCG CHEW Chew 1 capsule by mouth daily.   cyanocobalamin (,VITAMIN B-12,) 1000 MCG/ML injection Inject 1 mL (1,000 mcg total) into the muscle every 30 (thirty) days.   EPINEPHrine 0.3 mg/0.3 mL IJ SOAJ injection Inject into the muscle as needed.   levothyroxine (SYNTHROID) 88 MCG tablet Take 1 tablet (88 mcg total) by mouth daily before breakfast.   losartan (COZAAR) 50 MG tablet TAKE ONE (1) TABLET BY MOUTH ONCE DAILY   metFORMIN (GLUCOPHAGE) 1000 MG tablet TAKE ONE TABLET TWICE DAILY WITH MEALS   metoprolol succinate (TOPROL XL) 25 MG 24 hr tablet Take 1 tablet (25 mg total) by mouth in the morning and at bedtime.   Multiple Vitamin (MULTIVITAMIN WITH MINERALS) TABS tablet Take 1 tablet by mouth daily.   omega-3 acid ethyl esters (LOVAZA) 1 g capsule Take 2 capsules (2 g total) by mouth 2 (two) times daily.   omeprazole (PRILOSEC) 20 MG capsule TAKE ONE (1) CAPSULE EACH DAY   Probiotic Product (PROBIOTIC & ACIDOPHILUS EX ST PO) Take 1 tablet by mouth daily.    torsemide (DEMADEX) 20 MG tablet Take 1 tablet (20 mg total) by  mouth 3 (three) times a week. Please take this on Monday, Wednesday, and Friday.   tretinoin (RETIN-A) 0.05 % cream Apply 1 application topically at bedtime.   venlafaxine XR (EFFEXOR-XR) 150 MG 24 hr capsule Take 1 capsule (150 mg total) by mouth daily with breakfast.   Vitamin D, Ergocalciferol, (DRISDOL) 1.25 MG (50000 UNIT) CAPS capsule Take 1 capsule (50,000 Units total) by mouth every Monday.   Zinc Sulfate (ZINC 15 PO) Take 1 tablet by mouth daily.   zolpidem (AMBIEN) 10 MG tablet Take 1 tablet (10 mg total) by mouth at bedtime as needed for sleep.   No facility-administered encounter medications on file as of 07/22/2020.    Current Diagnosis: Patient Active Problem List   Diagnosis Date Noted   Anxiety    Arthritis    Ataxic gait    Full dentures    Incontinence    Insomnia    Other amnesia    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    Primary insomnia    Wears glasses    Diabetic glomerulopathy (HCC) 09/16/2019   Urge incontinence of urine 09/16/2019   Secondary hypothyroidism 09/16/2019   Longstanding persistent atrial fibrillation (HCC) 09/16/2019   Depression, major, recurrent, mild (HCC) 09/16/2019   Mild vitamin D deficiency 09/16/2019   Chronic anticoagulation 05/31/2018   High risk medication use 11/07/2017   B12 deficiency 10/31/2017   Neuropathy  in diabetes (HCC) 10/31/2017   Murmur, cardiac 08/15/2015   Restless legs syndrome 03/14/2013   Hypertensive heart disease 02/24/2013   Fibromyalgia 02/24/2013   Hereditary and idiopathic peripheral neuropathy 02/24/2013   Mixed hyperlipidemia 02/24/2013   Osteoporosis 02/24/2013   Thoracic or lumbosacral neuritis or radiculitis 02/24/2013   Essential hypertension 02/24/2013   Hyperlipidemia 02/24/2013     Follow-Up:  Pharmacist Review  Lucia Gaskins, CCP notified  Leilani Able, [facility] Clinical Pharmacist Assistant [phone]

## 2020-07-23 ENCOUNTER — Ambulatory Visit: Payer: Medicare Other

## 2020-07-23 NOTE — Telephone Encounter (Signed)
Prescription refill request for Eliquis received. Indication:atrial fibrillation Last office visit:10/21 munley Scr:0.85 11/21 Age: 70 Weight:88.7 kg  Prescription refilled

## 2020-07-28 DIAGNOSIS — E119 Type 2 diabetes mellitus without complications: Secondary | ICD-10-CM | POA: Insufficient documentation

## 2020-07-28 DIAGNOSIS — I4891 Unspecified atrial fibrillation: Secondary | ICD-10-CM | POA: Insufficient documentation

## 2020-07-28 DIAGNOSIS — F32A Depression, unspecified: Secondary | ICD-10-CM | POA: Insufficient documentation

## 2020-07-28 DIAGNOSIS — E034 Atrophy of thyroid (acquired): Secondary | ICD-10-CM | POA: Insufficient documentation

## 2020-07-28 DIAGNOSIS — I10 Essential (primary) hypertension: Secondary | ICD-10-CM | POA: Insufficient documentation

## 2020-08-04 DIAGNOSIS — M3501 Sicca syndrome with keratoconjunctivitis: Secondary | ICD-10-CM | POA: Diagnosis not present

## 2020-08-04 NOTE — Progress Notes (Signed)
Cardiology Office Note:    Date:  08/05/2020   ID:  April Munoz, DOB 1951-05-02, MRN [id number]  PCP:  [name], MD  Cardiologist:  Shirlee More, MD    Referring MD: [name], MD    ASSESSMENT:    1. Paroxysmal atrial fibrillation (HCC)   2. Typical atrial flutter ([place])   3. Chronic anticoagulation   4. Hypertensive heart disease without heart failure   5. Pulmonary infiltrates    PLAN:    In order of problems listed above:  1. Stable she is maintaining sinus rhythm after her most recent EP ablation continue her beta-blocker no longer an antiarrhythmic drug as well as her anticoagulant. 2. Stable BP at target no evidence of heart failure she takes a low-dose loop diuretic we will continue the same 3. I will go ahead and order the CT scan and the PFTs with a copy to her pulmonary physician.   Next appointment: 6 months   Medication Adjustments/Labs and Tests Ordered: Current medicines are reviewed at length with the patient today.  Concerns regarding medicines are outlined above.  No orders of the defined types were placed in this encounter.  No orders of the defined types were placed in this encounter.   Chief Complaint  Patient presents with  . Follow-up  . Atrial Fibrillation  . Atrial Flutter    History of Present Illness:    April Munoz is a 70 y.o. female with a hx of atrial fibrillation moderate stroke risk CHADS2 score 4 with previous TIA anticoagulated as well as  chronic pulmonary infection, hyperlipidemia hypertension and type 2 diabetes mellitus.  She has had EP catheter ablation for for atrial fibrillation and most recently atrial flutter 04/10/2019.She had an echocardiogram 02/05/2020 showing ejection fraction 60 to 65% mild concentric LVH elevated left ventricular filling pressure and no significant valvular abnormality. Extended ZIO monitor for 7 days showed no recurrence of atrial fibrillation or flutter and no bradycardia. She has been seen by  pulmonary Dr. Vaughan Browner for abnormal CT scan felt the appearance suggested atypical Mycobacterium decision was made with stable imaging to defer bronchoscopy.  Plan was to repeat a CT scan in 3 months to get full PFTs.  Her IgE level was significantly elevated approximately 4 times top normal.   She was last seen 04/26/2020.  Compliance with diet, lifestyle and medications: Yes  In general is done well but has it daily or shortness of breath when she bends over and she feels like her breathing and heart stop for moment.  She has not received a message about the CT scan I will go ahead and order it at [facility] as well as the PFTs with a copy to her pulmonary physician.  She has had no episodes of atrial fibrillation edema orthopnea chest pain or syncope. Past Medical History:  Diagnosis Date  . Anxiety   . Arthritis   . Ataxic gait   . Atrial fibrillation ([place])   . Atrophy of thyroid   . B12 deficiency 10/31/2017  . Cellulitis of abdominal wall 07/07/2016  . Chronic anticoagulation 05/31/2018  . Depression   . Depression, major, recurrent, mild ([place]) 09/16/2019  . Diabetes mellitus without complication ([place])   . Diabetic glomerulopathy ([place]) 09/16/2019  . Esophageal stricture 06/05/2017  . Essential hypertension 02/24/2013  . Fatigue 01/20/2020  . Fibromyalgia 02/24/2013  . Full dentures   . Hereditary and idiopathic peripheral neuropathy 02/24/2013  . High risk medication use 11/07/2017  . Hyperlipidemia 02/24/2013  .  Hypertension   . Hypertensive heart disease 02/24/2013  . Incontinence   . Insomnia   . Longstanding persistent atrial fibrillation ([place]) 09/16/2019  . Mild vitamin D deficiency 09/16/2019  . Mixed hyperlipidemia 02/24/2013  . Murmur, cardiac 08/15/2015  . Neuropathy in diabetes ([place]) 10/31/2017  . Osteoporosis   . Other amnesia   . Other amnesia   . 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   . Paroxysmal atrial fibrillation ([place]) 08/15/2015    CHADS2vasc=3 CHADS2vasc=3  . Primary insomnia   . Restless legs syndrome 03/14/2013  . Secondary hypothyroidism 09/16/2019  . Thoracic or lumbosacral neuritis or radiculitis 02/24/2013  . Type 2 diabetes mellitus, without long-term current use of insulin ([place]) 02/24/2013  . Urge incontinence of urine 09/16/2019  . Wears glasses     Past Surgical History:  Procedure Laterality Date  . A-FLUTTER ABLATION N/A 07/30/2019   Procedure: A-FLUTTER ABLATION;  Surgeon: Constance Haw, MD;  Location: [facility];  Service: Cardiovascular;  Laterality: N/A;  . ATRIAL FIBRILLATION ABLATION N/A 03/21/2019   Procedure: ATRIAL FIBRILLATION ABLATION;  Surgeon: Constance Haw, MD;  Location: [facility];  Service: Cardiovascular;  Laterality: N/A;  . BREAST REDUCTION SURGERY  1983  . CATARACT EXTRACTION, BILATERAL  2018, 2019  . CHOLECYSTECTOMY  1971   open  . DIAGNOSTIC LAPAROSCOPY  2010   lysis of adhesions  . DILATION AND CURETTAGE OF UTERUS  2004  . KNEE ARTHROSCOPY  1998   left  . KNEE ARTHROSCOPY W/ LATERAL RETINACULAR REPAIR    . MASS EXCISION Left 11/11/2013   Procedure: EXCISION MUCOID CYST LEFT INDEX FINGER/DEBRIDEMENT LEFT INDEX FINGER;  Surgeon: Wynonia Sours, MD;  Location: [facility];  Service: Orthopedics;  Laterality: Left;  ANESTHESIA: IV REGIONAL/FAB  . SHOULDER ARTHROSCOPY W/ ROTATOR CUFF REPAIR  2007   left  . TRIGGER FINGER RELEASE Left 11/11/2013   Procedure: RELEASE A-1 PULLEY LEFT RING FINGER;  Surgeon: Wynonia Sours, MD;  Location: [facility];  Service: Orthopedics;  Laterality: Left;  . UMBILICAL HERNIA REPAIR  2008, 2010    Current Medications: Current Meds  Medication Sig  . acyclovir (ZOVIRAX) 800 MG tablet TAKE ONE (1) TABLET FOUR (4) TIMES DAILYAS NEEDED FOR SHINGLES  . atorvastatin (LIPITOR) 10 MG tablet Take 1 tablet (10 mg total) by mouth daily.  . B Complex-C-Folic Acid TABS Take 1 tablet by mouth daily.  . Biotin  1000 MCG tablet Take 1,000 mcg by mouth daily.  . Calcium-Vitamin D-Vitamin K (VIACTIV CALCIUM PLUS D) 650-12.5-40 MG-MCG-MCG CHEW Chew 1 capsule by mouth daily.  . cyanocobalamin (,VITAMIN B-12,) 1000 MCG/ML injection Inject 1 mL (1,000 mcg total) into the muscle every 30 (thirty) days.  Marland Kitchen ELIQUIS 5 MG TABS tablet TAKE ONE TABLET TWICE DAILY  . EPINEPHrine 0.3 mg/0.3 mL IJ SOAJ injection Inject into the muscle as needed.  Marland Kitchen levothyroxine (SYNTHROID) 88 MCG tablet Take 1 tablet (88 mcg total) by mouth daily before breakfast.  . losartan (COZAAR) 50 MG tablet TAKE ONE (1) TABLET BY MOUTH ONCE DAILY  . metFORMIN (GLUCOPHAGE) 1000 MG tablet TAKE ONE TABLET TWICE DAILY WITH MEALS  . metoprolol succinate (TOPROL XL) 25 MG 24 hr tablet Take 1 tablet (25 mg total) by mouth in the morning and at bedtime.  . Multiple Vitamin (MULTIVITAMIN WITH MINERALS) TABS tablet Take 1 tablet by mouth daily.  Marland Kitchen omega-3 acid ethyl esters (LOVAZA) 1 g capsule Take 2 capsules (2 g  total) by mouth 2 (two) times daily. (Patient taking differently: Take 2 capsules by mouth daily.)  . omeprazole (PRILOSEC) 20 MG capsule TAKE ONE (1) CAPSULE EACH DAY  . Probiotic Product (PROBIOTIC & ACIDOPHILUS EX ST PO) Take 1 tablet by mouth daily.   Marland Kitchen torsemide (DEMADEX) 20 MG tablet Take 1 tablet (20 mg total) by mouth 3 (three) times a week. Please take this on Monday, Wednesday, and Friday.  . tretinoin (RETIN-A) 0.05 % cream Apply 1 application topically at bedtime.  Marland Kitchen venlafaxine XR (EFFEXOR-XR) 150 MG 24 hr capsule Take 1 capsule (150 mg total) by mouth daily with breakfast.  . Vitamin D, Ergocalciferol, (DRISDOL) 1.25 MG (50000 UNIT) CAPS capsule Take 1 capsule (50,000 Units total) by mouth every Monday.  . Zinc Sulfate (ZINC 15 PO) Take 1 tablet by mouth daily.  Marland Kitchen zolpidem (AMBIEN) 10 MG tablet Take 1 tablet (10 mg total) by mouth at bedtime as needed for sleep.     Allergies:   Prednisone   Social History   Socioeconomic  History  . Marital status: Married    Spouse name: Not on file  . Number of children: Not on file  . Years of education: Not on file  . Highest education level: Not on file  Occupational History  . Occupation: retired  Tobacco Use  . Smoking status: Never Smoker  . Smokeless tobacco: Never Used  Vaping Use  . Vaping Use: Never used  Substance and Sexual Activity  . Alcohol use: Yes    Alcohol/week: 1.0 - 2.0 standard drink    Types: 1 - 2 Glasses of wine per week    Comment: 4 times a week  . Drug use: No  . Sexual activity: Not on file  Other Topics Concern  . Not on file  Social History Narrative  . Not on file   Social Determinants of Health   Financial Resource Strain: Not on file  Food Insecurity: Not on file  Transportation Needs: Not on file  Physical Activity: Not on file  Stress: Not on file  Social Connections: Not on file     Family History: The patient's family history includes Atrial fibrillation in her mother; Diabetes in her maternal grandmother; Heart attack in her brother and mother; Heart disease in her brother; Heart failure in her father and paternal grandfather; Stroke in her paternal grandfather. ROS:   Please see the history of present illness.    All other systems reviewed and are negative.  EKGs/Labs/Other Studies Reviewed:    The following studies were reviewed today:  EKG:  EKG ordered today and personally reviewed.  The ekg ordered today demonstrates sinus rhythm minor nonspecific ST changes 1 APC  Recent Labs: 01/20/2020: Magnesium 1.6 05/26/2020: Hemoglobin 10.9; Platelets 352.0 05/31/2020: ALT 29; BUN 12; Creatinine, Ser 0.85; Potassium 5.1; Sodium 141; TSH 5.250  Recent Lipid Panel    Component Value Date/Time   CHOL 211 (H) 05/31/2020 0918   TRIG 187 (H) 05/31/2020 0918   HDL 72 05/31/2020 0918   CHOLHDL 2.9 05/31/2020 0918   LDLCALC 107 (H) 05/31/2020 0918    Physical Exam:    VS:  BP 140/80 (BP Location: Right Arm,  Patient Position: Sitting, Cuff Size: Normal)   Pulse 74   Ht 5\' 8"  (1.727 m)   Wt 196 lb 9.6 oz (89.2 kg)   BMI 29.89 kg/m     Wt Readings from Last 3 Encounters:  08/05/20 196 lb 9.6 oz (89.2 kg)  05/27/20 195  lb 9.6 oz (88.7 kg)  05/26/20 198 lb 3.2 oz (89.9 kg)     GEN:  Well nourished, well developed in no acute distress HEENT: Normal NECK: No JVD; No carotid bruits LYMPHATICS: No lymphadenopathy CARDIAC: RRR, no murmurs, rubs, gallops RESPIRATORY:  Clear to auscultation without rales, wheezing or rhonchi  ABDOMEN: Soft, non-tender, non-distended MUSCULOSKELETAL:  No edema; No deformity  SKIN: Warm and dry NEUROLOGIC:  Alert and oriented x 3 PSYCHIATRIC:  Normal affect    Signed, Shirlee More, MD  08/05/2020 2:27 PM    [place]

## 2020-08-05 ENCOUNTER — Ambulatory Visit (INDEPENDENT_AMBULATORY_CARE_PROVIDER_SITE_OTHER): Payer: Medicare Other | Admitting: Cardiology

## 2020-08-05 ENCOUNTER — Encounter: Payer: Self-pay | Admitting: Cardiology

## 2020-08-05 ENCOUNTER — Other Ambulatory Visit: Payer: Self-pay

## 2020-08-05 ENCOUNTER — Other Ambulatory Visit: Payer: Self-pay | Admitting: Physician Assistant

## 2020-08-05 VITALS — BP 140/80 | HR 74 | Ht 68.0 in | Wt 196.6 lb

## 2020-08-05 DIAGNOSIS — Z7901 Long term (current) use of anticoagulants: Secondary | ICD-10-CM

## 2020-08-05 DIAGNOSIS — R918 Other nonspecific abnormal finding of lung field: Secondary | ICD-10-CM

## 2020-08-05 DIAGNOSIS — I480 Paroxysmal atrial fibrillation: Secondary | ICD-10-CM

## 2020-08-05 DIAGNOSIS — I483 Typical atrial flutter: Secondary | ICD-10-CM

## 2020-08-05 DIAGNOSIS — I119 Hypertensive heart disease without heart failure: Secondary | ICD-10-CM

## 2020-08-05 NOTE — Patient Instructions (Signed)

## 2020-08-06 ENCOUNTER — Ambulatory Visit: Payer: Medicare Other | Admitting: Cardiology

## 2020-08-06 NOTE — Addendum Note (Signed)
Addended by: Resa Miner I on: 08/06/2020 01:45 PM   Modules accepted: Orders

## 2020-08-10 DIAGNOSIS — I358 Other nonrheumatic aortic valve disorders: Secondary | ICD-10-CM | POA: Diagnosis not present

## 2020-08-10 DIAGNOSIS — K449 Diaphragmatic hernia without obstruction or gangrene: Secondary | ICD-10-CM | POA: Diagnosis not present

## 2020-08-10 DIAGNOSIS — R918 Other nonspecific abnormal finding of lung field: Secondary | ICD-10-CM | POA: Diagnosis not present

## 2020-08-10 DIAGNOSIS — I2510 Atherosclerotic heart disease of native coronary artery without angina pectoris: Secondary | ICD-10-CM | POA: Diagnosis not present

## 2020-08-10 DIAGNOSIS — I700 Atherosclerosis of aorta: Secondary | ICD-10-CM | POA: Diagnosis not present

## 2020-08-10 LAB — PULMONARY FUNCTION TEST

## 2020-08-17 ENCOUNTER — Telehealth: Payer: Self-pay | Admitting: Cardiology

## 2020-08-17 NOTE — Telephone Encounter (Signed)
Patient calling because she has not gotten the results from her CT that was done at [facility] 08/10/20. Please call with results

## 2020-08-18 ENCOUNTER — Telehealth: Payer: Self-pay | Admitting: Pulmonary Disease

## 2020-08-18 NOTE — Telephone Encounter (Signed)
Spoke with pt and advised that we will check with Dr Vaughan Browner regarding CT results and call pt back. Please advise of CT results below:   April Munoz, April Munoz Patient ID: [id number] [place] Date of Birth: 1951/02/07 Gender: Female Procedure Date: August 10, 2020 09:22 Referring Physician: Richardo Priest Accession Number: [id number] [facility] Procedure Description: CT CHEST HIGH RESOLUTION W/O CM CLINICAL DATA: 70 year old female with history of gastric bypass procedure. Pulmonary infiltrate. EXAM: CT CHEST WITHOUT CONTRAST TECHNIQUE: Multidetector CT imaging of the chest was performed following the standard protocol without intravenous contrast. High resolution imaging of the lungs, as well as inspiratory and expiratory imaging, was performed. COMPARISON: Chest CT 04/30/2020. FINDINGS: Cardiovascular: Heart size is normal. There is no significant pericardial fluid, thickening or pericardial calcification. There is aortic atherosclerosis, as well as atherosclerosis of the great vessels of the mediastinum and the coronary arteries, including calcified atherosclerotic plaque in the left anterior descending, left circumflex and right coronary arteries. Mild calcifications of the aortic valve and mitral annulus. Mediastinum/Nodes: No pathologically enlarged mediastinal or hilar lymph nodes. Please note that accurate exclusion of hilar adenopathy is limited on noncontrast CT scans. Small hiatal hernia. No axillary lymphadenopathy. Lungs/Pleura: Very mild interlobular septal thickening is noted, most evident in the lung bases, which is highly nonspecific. High-resolution images demonstrate no other significant regions of ground-glass attenuation, septal thickening, subpleural reticulation, traction bronchiectasis or frank honeycombing to indicate interstitial lung disease. Inspiratory and expiratory imaging demonstrates some mild air trapping indicative of small airways disease. No acute  consolidative airspace disease. No pleural effusions. No suspicious appearing pulmonary nodules or masses are noted. Upper Abdomen: Postoperative changes in the upper abdomen of prior gastric bypass. Aortic atherosclerosis. 2.6 x 1.5 cm low-attenuation (8 HU) right adrenal nodule, compatible with a benign adenoma. [facility] [address], [address], [place], [place], [address] PRA LINE: [phone] [email or web address] Report exported on Wed, Aug 18, 2020 16:35:11 -0500 - Page 2 of 2 Musculoskeletal: There are no aggressive appearing lytic or blastic lesions noted in the visualized portions of the skeleton. IMPRESSION: 1. Very mild interlobular septal thickening in the lung bases. This is nonspecific, and not a typical finding seen in the setting of interstitial lung disease. If there is persistent clinical concern for interstitial lung disease, repeat high-resolution chest CT could be obtained in 12 months to assess for temporal changes in the appearance of the lung parenchyma. 2. Mild air trapping indicative of mild small airways disease. 3. Aortic atherosclerosis, in addition to 3 vessel coronary artery disease. Assessment for potential risk factor modification, dietary therapy or pharmacologic therapy may be warranted, if clinically indicated. 4. There are calcifications of the aortic valve and mitral annulus. Echocardiographic correlation for evaluation of potential valvular dysfunction may be warranted if clinically indicated. 5. Small right adrenal adenoma. Aortic Atherosclerosis (ICD10-I70.0). Electronically Signed By: Mauri Brooklyn.D.

## 2020-08-18 NOTE — Telephone Encounter (Signed)
Spoke to the patient just now and let her know that we have faxed the test results over to her pulmonary doctor in order for them to review these and follow up with her in regards to them. She verbalizes understanding and thanks me for the call back.

## 2020-08-20 NOTE — Telephone Encounter (Signed)
Please let her know that I have reviewed the images.  Her lungs look okay with no significant abnormalities.  There is no evidence of chronic infection.  I believe she has PFTs already ordered to be done at [facility].  We will review all the studies in detail when she returns to clinic next month.

## 2020-08-20 NOTE — Telephone Encounter (Signed)
Spoke with patient. She verbalized understanding of results. She stated that she also completed the PFTs last week at [facility]. Advised her that I would check Dr. Matilde Bash inbox for the results.   I checked Dr. Matilde Bash inbox for the PFT results, did not find them. Will send a request to medical records.   While on the phone, she wanted to know if Dr. Vaughan Browner had noticed any changes in her left lower lung. She was concerned about this area since it showed up on her abdomen CT from last year.   Dr. Vaughan Browner, can you please advise?

## 2020-08-23 ENCOUNTER — Telehealth: Payer: Self-pay | Admitting: Pulmonary Disease

## 2020-08-24 ENCOUNTER — Other Ambulatory Visit: Payer: Self-pay | Admitting: Physician Assistant

## 2020-08-24 NOTE — Telephone Encounter (Signed)
Called and spoke with pt letting her know the info stated by Dr. Vaughan Browner and she verbalized understanding. Nothing further needed.

## 2020-08-24 NOTE — Telephone Encounter (Signed)
No significant changes in the left lower lung.

## 2020-08-24 NOTE — Telephone Encounter (Signed)
Called to speak with pt and she stated that someone had already called her with the results.

## 2020-08-25 NOTE — Progress Notes (Addendum)
Chronic Care Management Pharmacy Note  08/31/2020 Name:  April Munoz MRN:  [id number] DOB:  Jul 26, 1950   Plan Recommendations:   Recommend considering Trulicity 8.67 mg weekly for help with blood sugar management and weight loss. Patient feels that injection would be beneficial since she often sees whole pills in stool due to lack of absorption. Application prepared for patient signature if approved.   Consider increase dose of atorvastatin if LDL not improved or at goal at upcoming lab results.   Pharmacist coordinating application of Eliquis and Trulicity (if approved) for patient assistance. Applications ready for signature during visit 09/03/2020  Subjective: April Munoz is an 70 y.o. year old female who is a primary patient of Cox, Kirsten, MD.  The CCM team was consulted for assistance with disease management and care coordination needs.    Engaged with patient face to face for initial visit in response to provider referral for pharmacy case management and/or care coordination services.   Consent to Services:  The patient was given the following information about Chronic Care Management services today, agreed to services, and gave verbal consent: 1. CCM service includes personalized support from designated clinical staff supervised by the primary care provider, including individualized plan of care and coordination with other care providers 2. 24/7 contact phone numbers for assistance for urgent and routine care needs. 3. Service will only be billed when office clinical staff spend 20 minutes or more in a month to coordinate care. 4. Only one practitioner may furnish and bill the service in a calendar month. 5.The patient may stop CCM services at any time (effective at the end of the month) by phone call to the office staff. 6. The patient will be responsible for cost sharing (co-pay) of up to 20% of the service fee (after annual deductible is met). Patient agreed to services and consent  obtained.  Patient Care Team: [name], MD as PCP - General (Family Medicine) Richardo Priest, MD as PCP - Cardiology (Cardiology) Constance Haw, MD as PCP - Electrophysiology (Cardiology) Richardo Priest, MD as Consulting Physician (Cardiology) Misenheimer, Christia Reading, MD as Consulting Physician (Unknown Physician Specialty) Pollyann Samples, MD as Consulting Physician (General Surgery) Burnice Logan, [facility] as Pharmacist (Pharmacist)  Recent office visits: 05/27/2020 - increase zolpidem 10 mg once daily. Work on [employer] and exercise. Lab results: Sugar elevated. TSH abnormal - increase Synthroid 88 mcg daily in am. Continue vitamin D supplement. LDL elevated and TG elevated. LFT and Kidney function normal. No evidence of B12 deficiency.   Recent consult visits: 08/05/2020 - Cardio - continue beta-blocker and anticoagulation. Order CT scan and PFTs.  06/02/2020 - GI - continue PPI. Fiber daily and miralax prn for constipation.  05/26/2020 - pulmonology - labs ordered. Schedule CT in 3 months.  04/26/2020 - Cardio - take diuretic 3 days a week. Refer to pulmonary if atypical pulmonary infection. Continue anticoagulant. Resume statin since previous TIA and hyperlipidemia.  03/09/2020 - nephrology - manage blood pressure. Low sodium diet. Continue ACE/ARB.    Hospital visits:   Objective:  Lab Results  Component Value Date   CREATININE 0.85 05/31/2020   BUN 12 05/31/2020   GFRNONAA 69 05/31/2020   GFRAA 80 05/31/2020   NA 141 05/31/2020   K 5.1 05/31/2020   CALCIUM 8.6 (L) 05/31/2020   CO2 23 05/31/2020    Lab Results  Component Value Date/Time   HGBA1C 7.2 (H) 01/26/2020 09:54 AM   HGBA1C 6.7 (H) 09/18/2019 09:42 AM  MICROALBUR 150 05/27/2020 09:39 AM   MICROALBUR 150 09/18/2019 01:25 PM    Last diabetic Eye exam: No results found for: HMDIABEYEEXA  Last diabetic Foot exam: No results found for: HMDIABFOOTEX   Lab Results  Component Value Date   CHOL 211  (H) 05/31/2020   HDL 72 05/31/2020   LDLCALC 107 (H) 05/31/2020   TRIG 187 (H) 05/31/2020   CHOLHDL 2.9 05/31/2020    Hepatic Function Latest Ref Rng & Units 05/31/2020 01/27/2020 01/26/2020  Total Protein 6.0 - 8.5 g/dL 6.4 6.7 6.4  Albumin 3.7 - 4.7 g/dL 4.1 4.5 4.3  AST 0 - 40 IU/L '17 17 19  ' ALT 0 - 32 IU/L '29 18 20  ' Alk Phosphatase 44 - 121 IU/L 68 66 63  Total Bilirubin 0.0 - 1.2 mg/dL 0.3 0.3 0.3    Lab Results  Component Value Date/Time   TSH 5.250 (H) 05/31/2020 09:18 AM   TSH 3.240 02/26/2020 08:16 AM    CBC Latest Ref Rng & Units 05/26/2020 01/20/2020 09/18/2019  WBC 4.0 - 10.5 K/uL 7.7 8.5 7.1  Hemoglobin 12.0 - 15.0 g/dL 10.9(L) 11.2 12.2  Hematocrit 36.0 - 46.0 % 33.9(L) 33.9(L) 36.6  Platelets 150.0 - 400.0 K/uL 352.0 389 356    Lab Results  Component Value Date/Time   VD25OH 34.4 05/31/2020 09:18 AM   VD25OH 36.9 02/26/2020 08:19 AM    Clinical ASCVD: No  The 10-year ASCVD risk score Mikey Bussing DC Jr., et al., 2013) is: 28.8%   Values used to calculate the score:     Age: 49 years     Sex: Female     Is Non-Hispanic African American: No     Diabetic: Yes     Tobacco smoker: No     Systolic Blood Pressure: 597 mmHg     Is BP treated: Yes     HDL Cholesterol: 72 mg/dL     Total Cholesterol: 211 mg/dL    Depression screen [facility] 2/9 08/30/2020 05/27/2020 05/27/2020  Decreased Interest '2 1 1  ' Down, Depressed, Hopeless '2 1 1  ' PHQ - 2 Score '4 2 2  ' Altered sleeping '3 3 3  ' Tired, decreased energy '3 2 2  ' Change in appetite '1 2 2  ' Feeling bad or failure about yourself  '1 1 1  ' Trouble concentrating 0 2 2  Moving slowly or fidgety/restless - 2 2  Suicidal thoughts - 0 0  PHQ-9 Score '12 14 14  ' Difficult doing work/chores - Somewhat difficult Somewhat difficult      Social History   Tobacco Use  Smoking Status Never Smoker  Smokeless Tobacco Never Used   BP Readings from Last 3 Encounters:  08/05/20 140/80  05/27/20 (!) 142/70  05/26/20 138/80   Pulse  Readings from Last 3 Encounters:  08/05/20 74  05/27/20 88  05/26/20 74   Wt Readings from Last 3 Encounters:  08/05/20 196 lb 9.6 oz (89.2 kg)  05/27/20 195 lb 9.6 oz (88.7 kg)  05/26/20 198 lb 3.2 oz (89.9 kg)    Assessment/Interventions: Review of patient past medical history, allergies, medications, health status, including review of consultants reports, laboratory and other test data, was performed as part of comprehensive evaluation and provision of chronic care management services.   SDOH:  (Social Determinants of Health) assessments and interventions performed: Yes   CCM Care Plan  Allergies  Allergen Reactions  . Prednisone     In high doses causes her to feel crazy    Medications Reviewed Today  Reviewed by Burnice Logan, [facility] (Pharmacist) on 08/31/20 at 2117  Med List Status: <None>  Medication Order Taking? Sig Documenting Provider Last Dose Status Informant  acyclovir (ZOVIRAX) 800 MG tablet [id number] Yes TAKE ONE (1) TABLET FOUR (4) TIMES DAILYAS NEEDED FOR SHINGLES Marge Duncans, PA-C Taking Active   atorvastatin (LIPITOR) 10 MG tablet [id number] Yes Take 1 tablet (10 mg total) by mouth daily. [name], MD Taking Active   B Complex-C-Folic Acid TABS [id number] Yes Take 1 tablet by mouth daily. [provider] Taking Active Self  Biotin 1000 MCG tablet [id number] Yes Take 1,000 mcg by mouth daily. [provider] Taking Active Self  Calcium-Vitamin D-Vitamin K Hinton Rao CALCIUM PLUS D) 650-12.5-40 MG-MCG-MCG CHEW [id number] Yes Chew 1 capsule by mouth daily. [provider] Taking Active Self  cyanocobalamin (,VITAMIN B-12,) 1000 MCG/ML injection [id number] Yes Inject 1 mL (1,000 mcg total) into the muscle every 30 (thirty) days. [name], MD Taking Active   ELIQUIS 5 MG TABS tablet [id number] Yes TAKE ONE TABLET TWICE DAILY Bettina Gavia Hilton Cork, MD Taking Active   EPINEPHrine 0.3 mg/0.3 mL IJ SOAJ injection [id number] Yes Inject into the muscle  as needed. [provider] Taking Active   levothyroxine (SYNTHROID) 88 MCG tablet [id number] Yes Take 1 tablet (88 mcg total) by mouth daily before breakfast. Cox, Kirsten, MD Taking Active   losartan (COZAAR) 50 MG tablet [id number] Yes TAKE ONE (1) TABLET BY MOUTH ONCE DAILY Cox, Kirsten, MD Taking Active   Melatonin 10 MG TABS [id number] Yes Take 1 tablet by mouth at bedtime. [provider] Taking Active   metFORMIN (GLUCOPHAGE) 1000 MG tablet [id number] Yes TAKE ONE TABLET TWICE DAILY WITH MEALS Marge Duncans, PA-C Taking Active   metoprolol succinate (TOPROL XL) 25 MG 24 hr tablet [id number] Yes Take 1 tablet (25 mg total) by mouth in the morning and at bedtime. Richardo Priest, MD Taking Active   Multiple Vitamin (MULTIVITAMIN WITH MINERALS) TABS tablet [id number] Yes Take 1 tablet by mouth daily. [provider] Taking Active Self  omeprazole (PRILOSEC) 20 MG capsule [id number] Yes TAKE ONE (1) [place]  Patient taking differently: Take 20 mg by mouth every evening.   Marge Duncans, PA-C Taking Active Self  Probiotic Product (PROBIOTIC & ACIDOPHILUS EX ST PO) [id number] Yes Take 1 tablet by mouth daily.  [provider] Taking Active Self           Med Note Stevan Born   Tue May 27, 2019  3:15 PM)    torsemide (DEMADEX) 20 MG tablet [id number] Yes Take 1 tablet (20 mg total) by mouth 3 (three) times a week. Please take this on Monday, Wednesday, and Friday. Richardo Priest, MD Taking Active   tretinoin (RETIN-A) 0.05 % cream [id number] Yes Apply 1 application topically 3 times/day as needed-between meals & bedtime. [provider] Taking Active   venlafaxine XR (EFFEXOR-XR) 150 MG 24 hr capsule [id number] Yes Take 1 capsule (150 mg total) by mouth daily with breakfast. Cox, Kirsten, MD Taking Active   Vitamin D, Ergocalciferol, (DRISDOL) 1.25 MG (50000 UNIT) CAPS capsule [id number] Yes Take 1 capsule (50,000 Units total) by mouth every Monday. Cox,  Kirsten, MD Taking Active   Zinc Sulfate (ZINC 15 PO) [id number] Yes Take 1 tablet by mouth daily. [provider] Taking Active   zolpidem (AMBIEN) 10 MG tablet [id number] Yes Take 1 tablet (10 mg total) by mouth at bedtime as needed for sleep. [name], MD Taking  Expired 06/26/20 2359           Patient Active Problem List   Diagnosis Date Noted  . Hypertension   . Diabetes mellitus without complication ([place])   . Depression   . Atrophy of thyroid   . Atrial fibrillation ([place])   . Anxiety   . Arthritis   . Ataxic gait   . Full dentures   . Incontinence   . Insomnia   . Other amnesia   . 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   . Primary insomnia   . Wears glasses   . Fatigue 01/20/2020  . Diabetic glomerulopathy ([place]) 09/16/2019  . Urge incontinence of urine 09/16/2019  . Secondary hypothyroidism 09/16/2019  . Longstanding persistent atrial fibrillation ([place]) 09/16/2019  . Depression, major, recurrent, mild ([place]) 09/16/2019  . Mild vitamin D deficiency 09/16/2019  . Chronic anticoagulation 05/31/2018  . High risk medication use 11/07/2017  . B12 deficiency 10/31/2017  . Neuropathy in diabetes ([place]) 10/31/2017  . Esophageal stricture 06/05/2017  . Cellulitis of abdominal wall 07/07/2016  . Murmur, cardiac 08/15/2015  . Paroxysmal atrial fibrillation ([place]) 08/15/2015  . Restless legs syndrome 03/14/2013  . Hypertensive heart disease 02/24/2013  . Fibromyalgia 02/24/2013  . Hereditary and idiopathic peripheral neuropathy 02/24/2013  . Mixed hyperlipidemia 02/24/2013  . Osteoporosis 02/24/2013  . Thoracic or lumbosacral neuritis or radiculitis 02/24/2013  . Essential hypertension 02/24/2013  . Hyperlipidemia 02/24/2013  . Type 2 diabetes mellitus, without long-term current use of insulin ([place]) 02/24/2013    Immunization History  Administered Date(s) Administered  . Fluad Quad(high Dose 65+) 03/18/2019, 04/25/2020  .  Influenza-Unspecified 03/18/2019, 05/14/2020  . Moderna Sars-Covid-2 Vaccination 09/09/2019, 10/04/2019, 04/19/2020  . Pneumococcal Conjugate-13 05/14/2015  . Pneumococcal Polysaccharide-23 05/16/2013, 07/27/2017    Conditions to be addressed/monitored:  Hypertension, Hyperlipidemia, Diabetes, Hypothyroidism, Depression, Anxiety, Osteoporosis and Insomnia.   Care Plan : [place]  Updates made by Burnice Logan, RPH since 08/31/2020 12:00 AM    Problem: afib, diabetes, hypertension, hyperlipidemia   Priority: High  Onset Date: 08/30/2020    Long-Range Goal: Disease Management   Start Date: 08/30/2020  Expected End Date: 08/30/2021  This Visit's Progress: On track  Priority: High  Note:      Current Barriers:  . Unable to independently afford treatment regimen . Unable to maintain control of diabetes  Pharmacist Clinical Goal(s):  Marland Kitchen Over the next 90 days, patient will verbalize ability to afford treatment regimen . achieve control of diabetes as evidenced by a1c through collaboration with PharmD and provider.   Interventions: . 1:1 collaboration with [name], MD regarding development and update of comprehensive plan of care as evidenced by provider attestation and co-signature . Inter-disciplinary care team collaboration (see longitudinal plan of care) . Comprehensive medication review performed; medication list updated in electronic medical record  Hypertension (BP goal <130/80) -controlled -Current treatment: . Losartan 50 mg bid  . Metoprolol XL 25 mg bid . Torsemide 20 mg three times weekly  -Medications previously tried: none reported  -Current home readings: 140/80 mmHg -Current dietary habits: reports eating during the night  -Current exercise habits: none reported  -Denies hypotensive/hypertensive symptoms -Educated on BP goals and benefits of medications for prevention of heart attack, stroke and kidney damage; Daily salt intake goal < 2300  mg; Exercise goal of 150 minutes per week; Importance of home blood pressure monitoring; Proper BP monitoring technique; -Counseled to monitor BP at home weekly, document, and provide log at future  appointments -Counseled on diet and exercise extensively Recommended to continue current medication   Hyperlipidemia: (LDL goal < 70) -uncontrolled -Current treatment: . Atorvastatin 10 mg daily  -Medications previously tried: pravastatin, Lovaza -Current dietary patterns: reports eating at night. Loves eggs.  -Current exercise habits: none reported -Educated on Cholesterol goals;  Benefits of statin for ASCVD risk reduction; Importance of limiting foods high in cholesterol; Exercise goal of 150 minutes per week; -Counseled on diet and exercise extensively Recommended to continue current medication Recommended considering increase in atorvastatin if LDL not improved at next blood work.   Diabetes (A1c goal <7%) -uncontrolled -Current medications: . metformin 1000 mg bid  -Medications previously tried: none reported  -Current home glucose readings . fasting glucose: 120-140s . post prandial glucose: 180 -Denies hypoglycemic/hyperglycemic symptoms -Current meal patterns:  . Patient reports eating during the night.  -Current exercise: none reported -Educated onA1c and blood sugar goals; Complications of diabetes including kidney damage, retinal damage, and cardiovascular disease; Benefits of weight loss; Benefits of routine self-monitoring of blood sugar; -Counseled to check feet daily and get yearly eye exams -Counseled on diet and exercise extensively Recommended considering low dose Trulicity 6.38 mg weekly if Dr. Tobie Poet approves. Patient aware that would slow gastric emptying but states that she needs food transit to slow down after numerous stomach surgeries.   Atrial Fibrillation (Goal: prevent stroke and major bleeding) -controlled  -CHADSVASC: 4  -Current treatment: . Rate  control: metoprolol xl 25 mg bid . Anticoagulation: Eliquis 5 mg bid  -Medications previously tried: none reported -Home BP and HR readings: none reported  -Counseled on increased risk of stroke due to Afib and benefits of anticoagulation for stroke prevention; importance of adherence to anticoagulant exactly as prescribed; avoidance of NSAIDs due to increased bleeding risk with anticoagulants; -Counseled on diet and exercise extensively Recommended to continue current medication Assessed patient finances. to consider application for Eliquis through patient assistance.   Osteoporosis / Osteopenia (Goal  Minimize risk of break or fracture) -controlled -Last DEXA Scan: not available in chart    -Current treatment  . calcium/vitamin D/vitamin k chew daily  . Vitamin D 50,000 units weekly on Monday  -Medications previously tried: none reported  -Recommend [id number] units of vitamin D daily. Recommend 1200 mg of calcium daily from dietary and supplemental sources. Recommend weight-bearing and muscle strengthening exercises for building and maintaining bone density. -Counseled on diet and exercise extensively Recommended to continue current medication  Depression (Goal: manage symptoms of depression) -controlled -Current treatment  . Venlafaxine XR 150 mg daily with brekfast -Medications previously tried: states she has tried other medications in the past but has been on venlafaxine of and on for 30 years  -Recommended to continue current medication   Insomnia (Goal: improve sleep) -controlled -Current treatment  . Zolpidem 10 mg at bedtime prn sleep  . Melatonin 10 mg daily  -Medications previously tried: none reported  -Counseled on good sleep hygeine.   Health Maintenance -Vaccine gaps: Shingles and Tetanus -Current therapy:  . tretinoin 0.05% at bedtime prn  . Multivitamin daily  . Probiotic daily  . Zinc Sulfate daily  . Epipen prn  . Vitamin B-12 1000 mcg/ml every 30  days . Biotin 1000 mcg daily  . B complex-C-folic acid daily  -Educated on Cost vs benefit of each product must be carefully weighed by individual consumer -Patient is satisfied with current therapy and denies issues -Recommended to continue current medication   Patient Goals/Self-Care Activities . Over the next 90  days, patient will:  - take medications as prescribed focus on medication adherence by using pill box check glucose daily , document, and provide at future appointments check blood pressure weekly, document, and provide at future appointments  Follow Up Plan: Telephone follow up appointment with care management team member scheduled for: 12/02/2020      Medication Assistance: Application for Eliquis  medication assistance program. in process.  Anticipated assistance start date 3% of income spent on medication.  See plan of care for additional detail.  Patient's preferred pharmacy is:  [place], [place] [address] Phone: [phone] Fax: [phone]  Uses pill box? Yes Pt endorses good compliance  We discussed: Benefits of medication synchronization, packaging and delivery as well as enhanced pharmacist oversight with [employer]. Patient decided to: Continue current medication management strategy  Care Plan and Follow Up Patient Decision:  Patient agrees to Care Plan and Follow-up.  Plan: Telephone follow up appointment with care management team member scheduled for:  11/2020

## 2020-08-26 ENCOUNTER — Telehealth: Payer: Self-pay

## 2020-08-26 NOTE — Progress Notes (Signed)
Chronic Care Management Pharmacy Assistant   Name: April Munoz  MRN: [id number] DOB: 01/26/1951  Reason for Encounter: Initial questions for appointment with [name], CPP Patient Questions:    PCP : [name], MD  Allergies:   Allergies  Allergen Reactions  . Prednisone     In high doses causes her to feel crazy    Medications: Outpatient Encounter Medications as of 08/26/2020  Medication Sig  . acyclovir (ZOVIRAX) 800 MG tablet TAKE ONE (1) TABLET FOUR (4) TIMES DAILYAS NEEDED FOR SHINGLES  . atorvastatin (LIPITOR) 10 MG tablet Take 1 tablet (10 mg total) by mouth daily.  . B Complex-C-Folic Acid TABS Take 1 tablet by mouth daily.  . Biotin 1000 MCG tablet Take 1,000 mcg by mouth daily.  . Calcium-Vitamin D-Vitamin K (VIACTIV CALCIUM PLUS D) 650-12.5-40 MG-MCG-MCG CHEW Chew 1 capsule by mouth daily.  . cyanocobalamin (,VITAMIN B-12,) 1000 MCG/ML injection Inject 1 mL (1,000 mcg total) into the muscle every 30 (thirty) days.  Marland Kitchen ELIQUIS 5 MG TABS tablet TAKE ONE TABLET TWICE DAILY  . EPINEPHrine 0.3 mg/0.3 mL IJ SOAJ injection Inject into the muscle as needed.  Marland Kitchen levothyroxine (SYNTHROID) 88 MCG tablet Take 1 tablet (88 mcg total) by mouth daily before breakfast.  . losartan (COZAAR) 50 MG tablet TAKE ONE (1) TABLET BY MOUTH ONCE DAILY  . metFORMIN (GLUCOPHAGE) 1000 MG tablet TAKE ONE TABLET TWICE DAILY WITH MEALS  . metoprolol succinate (TOPROL XL) 25 MG 24 hr tablet Take 1 tablet (25 mg total) by mouth in the morning and at bedtime.  . Multiple Vitamin (MULTIVITAMIN WITH MINERALS) TABS tablet Take 1 tablet by mouth daily.  Marland Kitchen omega-3 acid ethyl esters (LOVAZA) 1 g capsule Take 2 capsules (2 g total) by mouth 2 (two) times daily. (Patient taking differently: Take 2 capsules by mouth daily.)  . omeprazole (PRILOSEC) 20 MG capsule TAKE ONE (1) CAPSULE EACH DAY  . Probiotic Product (PROBIOTIC & ACIDOPHILUS EX ST PO) Take 1 tablet by mouth daily.   Marland Kitchen torsemide (DEMADEX) 20 MG  tablet Take 1 tablet (20 mg total) by mouth 3 (three) times a week. Please take this on Monday, Wednesday, and Friday.  . tretinoin (RETIN-A) 0.05 % cream Apply 1 application topically at bedtime.  Marland Kitchen venlafaxine XR (EFFEXOR-XR) 150 MG 24 hr capsule Take 1 capsule (150 mg total) by mouth daily with breakfast.  . Vitamin D, Ergocalciferol, (DRISDOL) 1.25 MG (50000 UNIT) CAPS capsule Take 1 capsule (50,000 Units total) by mouth every Monday.  . Zinc Sulfate (ZINC 15 PO) Take 1 tablet by mouth daily.  Marland Kitchen zolpidem (AMBIEN) 10 MG tablet Take 1 tablet (10 mg total) by mouth at bedtime as needed for sleep.   No facility-administered encounter medications on file as of 08/26/2020.    Current Diagnosis: Patient Active Problem List   Diagnosis Date Noted  . Hypertension   . Diabetes mellitus without complication ([name])   . Depression   . Atrophy of thyroid   . Atrial fibrillation ([place])   . Anxiety   . Arthritis   . Ataxic gait   . Full dentures   . Incontinence   . Insomnia   . Other amnesia   . 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   . Primary insomnia   . Wears glasses   . Fatigue 01/20/2020  . Diabetic glomerulopathy ([name]) 09/16/2019  . Urge incontinence of urine 09/16/2019  . Secondary hypothyroidism 09/16/2019  . Longstanding persistent atrial fibrillation ([name])  09/16/2019  . Depression, major, recurrent, mild ([name]) 09/16/2019  . Mild vitamin D deficiency 09/16/2019  . Chronic anticoagulation 05/31/2018  . High risk medication use 11/07/2017  . B12 deficiency 10/31/2017  . Neuropathy in diabetes ([name]) 10/31/2017  . Esophageal stricture 06/05/2017  . Cellulitis of abdominal wall 07/07/2016  . Murmur, cardiac 08/15/2015  . Paroxysmal atrial fibrillation ([place]) 08/15/2015  . Restless legs syndrome 03/14/2013  . Hypertensive heart disease 02/24/2013  . Fibromyalgia 02/24/2013  . Hereditary and idiopathic peripheral neuropathy 02/24/2013  . Mixed  hyperlipidemia 02/24/2013  . Osteoporosis 02/24/2013  . Thoracic or lumbosacral neuritis or radiculitis 02/24/2013  . Essential hypertension 02/24/2013  . Hyperlipidemia 02/24/2013  . Type 2 diabetes mellitus, without long-term current use of insulin ([name]) 02/24/2013   Have you seen any other providers since your last visit?  08/05/20-cardiology 08/23/20-pulmonology 08/18/20-CT scan results  Any changes in your medications or health? Yes, she stated that she has severe muscle aches with Atorvastatin, has leg cramps no matter what time of day she takes it. She stated she feels like her energy has been zapped, she said it also may be the Losartan as well.  She had a recent CT scan of her lung, a spot was found but has not changed in the last 6 months, she has a follow up appointment scheduled.    Any side effects from any medications? she stated that she has severe muscle aches with Atorvastatin, has leg cramps no matter what time of day she takes it. She stated she feels like her energy has been zapped, she said it also may be the Losartan as well.  She is not sure if her Effexor is working, she said she feels more nervous, she has taken this medication off  Do you have an symptoms or problems not managed by your medications? No  Any concerns about your health right now? Patient is worried about her increase muscle pain and cramps, and her seeming more nervous.   Has your provider asked that you check blood pressure, blood sugar, or follow special diet at home? Patient checks her glucose daily, typically run around 130-135, they do sometime go below 70 and she has the glucose tablets that she takes.  This usually happens if he has taken her medication and not eaten. She said she has a habit of eating and snacking at night.   Do you get any type of exercise on a regular basis? Patient stays very active at home, both her adult children are now back living at home.  Can you think of a goal you would  like to reach for your health? To have better diabetic control, to improve her legs cramps and not having energy.  Do you have any problems getting your medications? Eliquis is expensive, for the first few months of the year she is paying over $500 per month.   Is there anything that you would like to discuss during the appointment? Would like to talk about the blood sugar shots that you take once a week. She also wants to talk about a Dexcom glucose meter, so she can manage her numbers better. She wants to discuss medication costs.   Patient knows to bring medications and supplements to appointment.   Follow-Up:  Pharmacist Review  [name], CPP notified  [name], [place] Pharmacist Assistant [phone]

## 2020-08-30 ENCOUNTER — Ambulatory Visit (INDEPENDENT_AMBULATORY_CARE_PROVIDER_SITE_OTHER): Payer: Medicare Other

## 2020-08-30 DIAGNOSIS — I110 Hypertensive heart disease with heart failure: Secondary | ICD-10-CM

## 2020-08-30 DIAGNOSIS — E1121 Type 2 diabetes mellitus with diabetic nephropathy: Secondary | ICD-10-CM | POA: Diagnosis not present

## 2020-08-30 DIAGNOSIS — E782 Mixed hyperlipidemia: Secondary | ICD-10-CM

## 2020-08-30 DIAGNOSIS — F331 Major depressive disorder, recurrent, moderate: Secondary | ICD-10-CM

## 2020-08-31 NOTE — Patient Instructions (Addendum)
Visit Information  Thank you for your time discussing your medications. I look forward to working with you to achieve your health care goals. Below is a summary of what we talked about during our visit.   Goals Addressed            This Visit's Progress   . Learn More About My Health       Timeframe:  Long-Range Goal Priority:  High Start Date:       08/30/2020                      Expected End Date:         08/30/2021               Follow Up Date 12/02/2020    - make a list of questions - ask questions - repeat what I heard to make sure I understand - bring a list of my medicines to the visit    Why is this important?    The best way to learn about your health and care is by talking to the doctor and nurse.   They will answer your questions and give you information in the way that you like best.    Notes:     Marland Kitchen Manage My Medicine       Timeframe:  Long-Range Goal Priority:  High Start Date:          08/30/2020                   Expected End Date:            08/30/2021           Follow Up Date 12/02/2020    - call for medicine refill 2 or 3 days before it runs out - keep a list of all the medicines I take; vitamins and herbals too - use a pillbox to sort medicine    Why is this important?   . These steps will help you keep on track with your medicines.   Notes:     . Set My Target A1C-Diabetes Type 2       Timeframe:  Long-Range Goal Priority:  High Start Date:        08/30/2020                     Expected End Date:     08/30/2021                  Follow Up Date 12/02/2020    - set target A1C    Why is this important?    Your target A1C is decided together by you and your doctor.   It is based on several things like your age and other health issues.    Notes:        Patient Care Plan: CCM Pharmacy Care Plan    Problem Identified: afib, diabetes, hypertension, hyperlipidemia   Priority: High  Onset Date: 08/30/2020    Long-Range Goal: Disease  Management   Start Date: 08/30/2020  Expected End Date: 08/30/2021  This Visit's Progress: On track  Priority: High  Note:      Current Barriers:  . Unable to independently afford treatment regimen . Unable to maintain control of diabetes  Pharmacist Clinical Goal(s):  Marland Kitchen Over the next 90 days, patient will verbalize ability to afford treatment regimen . achieve control of diabetes as evidenced by a1c through collaboration  with PharmD and provider.   Interventions: . 1:1 collaboration with [name], MD regarding development and update of comprehensive plan of care as evidenced by provider attestation and co-signature . Inter-disciplinary care team collaboration (see longitudinal plan of care) . Comprehensive medication review performed; medication list updated in electronic medical record  Hypertension (BP goal <130/80) -controlled -Current treatment: . Losartan 50 mg bid  . Metoprolol XL 25 mg bid . Torsemide 20 mg three times weekly  -Medications previously tried: none reported  -Current home readings: 140/80 mmHg -Current dietary habits: reports eating during the night  -Current exercise habits: none reported  -Denies hypotensive/hypertensive symptoms -Educated on BP goals and benefits of medications for prevention of heart attack, stroke and kidney damage; Daily salt intake goal < 2300 mg; Exercise goal of 150 minutes per week; Importance of home blood pressure monitoring; Proper BP monitoring technique; -Counseled to monitor BP at home weekly, document, and provide log at future appointments -Counseled on diet and exercise extensively Recommended to continue current medication   Hyperlipidemia: (LDL goal < 70) -uncontrolled -Current treatment: . Atorvastatin 10 mg daily  -Medications previously tried: pravastatin, Lovaza -Current dietary patterns: reports eating at night. Loves eggs.  -Current exercise habits: none reported -Educated on Cholesterol goals;   Benefits of statin for ASCVD risk reduction; Importance of limiting foods high in cholesterol; Exercise goal of 150 minutes per week; -Counseled on diet and exercise extensively Recommended to continue current medication Recommended considering increase in atorvastatin if LDL not improved at next blood work.   Diabetes (A1c goal <7%) -uncontrolled -Current medications: . metformin 1000 mg bid  -Medications previously tried: none reported  -Current home glucose readings . fasting glucose: 120-140s . post prandial glucose: 180 -Denies hypoglycemic/hyperglycemic symptoms -Current meal patterns:  . Patient reports eating during the night.  -Current exercise: none reported -Educated onA1c and blood sugar goals; Complications of diabetes including kidney damage, retinal damage, and cardiovascular disease; Benefits of weight loss; Benefits of routine self-monitoring of blood sugar; -Counseled to check feet daily and get yearly eye exams -Counseled on diet and exercise extensively Recommended considering low dose Trulicity 1.44 mg weekly if Dr. Tobie Poet approves. Patient aware that would slow gastric emptying but states that she needs food transit to slow down after numerous stomach surgeries.   Atrial Fibrillation (Goal: prevent stroke and major bleeding) -controlled  -CHADSVASC: 4  -Current treatment: . Rate control: metoprolol xl 25 mg bid . Anticoagulation: Eliquis 5 mg bid  -Medications previously tried: none reported -Home BP and HR readings: none reported  -Counseled on increased risk of stroke due to Afib and benefits of anticoagulation for stroke prevention; importance of adherence to anticoagulant exactly as prescribed; avoidance of NSAIDs due to increased bleeding risk with anticoagulants; -Counseled on diet and exercise extensively Recommended to continue current medication Assessed patient finances. to consider application for Eliquis through patient assistance.    Osteoporosis / Osteopenia (Goal  Minimize risk of break or fracture) -controlled -Last DEXA Scan: not available in chart    -Current treatment  . calcium/vitamin D/vitamin k chew daily  . Vitamin D 50,000 units weekly on Monday  -Medications previously tried: none reported  -Recommend [phone] units of vitamin D daily. Recommend 1200 mg of calcium daily from dietary and supplemental sources. Recommend weight-bearing and muscle strengthening exercises for building and maintaining bone density. -Counseled on diet and exercise extensively Recommended to continue current medication  Depression (Goal: manage symptoms of depression) -controlled -Current treatment  . Venlafaxine XR 150 mg  daily with brekfast -Medications previously tried: states she has tried other medications in the past but has been on venlafaxine of and on for 30 years  -Recommended to continue current medication   Insomnia (Goal: improve sleep) -controlled -Current treatment  . Zolpidem 10 mg at bedtime prn sleep  . Melatonin 10 mg daily  -Medications previously tried: none reported  -Counseled on good sleep hygeine.   Health Maintenance -Vaccine gaps: Shingles and Tetanus -Current therapy:  . tretinoin 0.05% at bedtime prn  . Multivitamin daily  . Probiotic daily  . Zinc Sulfate daily  . Epipen prn  . Vitamin B-12 1000 mcg/ml every 30 days . Biotin 1000 mcg daily  . B complex-C-folic acid daily  -Educated on Cost vs benefit of each product must be carefully weighed by individual consumer -Patient is satisfied with current therapy and denies issues -Recommended to continue current medication   Patient Goals/Self-Care Activities . Over the next 90 days, patient will:  - take medications as prescribed focus on medication adherence by using pill box check glucose daily , document, and provide at future appointments check blood pressure weekly, document, and provide at future appointments  Follow Up  Plan: Telephone follow up appointment with care management team member scheduled for: 12/02/2020      Ms. Marquard was given information about Chronic Care Management services today including:  1. CCM service includes personalized support from designated clinical staff supervised by her physician, including individualized plan of care and coordination with other care providers 2. 24/7 contact phone numbers for assistance for urgent and routine care needs. 3. Standard insurance, coinsurance, copays and deductibles apply for chronic care management only during months in which we provide at least 20 minutes of these services. Most insurances cover these services at 100%, however patients may be responsible for any copay, coinsurance and/or deductible if applicable. This service may help you avoid the need for more expensive face-to-face services. 4. Only one practitioner may furnish and bill the service in a calendar month. 5. The patient may stop CCM services at any time (effective at the end of the month) by phone call to the office staff.  Patient agreed to services and verbal consent obtained.   Print copy of patient instructions, educational materials, and care plan provided in person. Telephone follow up appointment with pharmacy team member scheduled for: 12/02/2020  Sherre Poot, PharmD Clinical Pharmacist [facility] [phone] (office) [phone] (mobile)  Diabetes Mellitus and Nutrition, Adult When you have diabetes, or diabetes mellitus, it is very important to have healthy eating habits because your blood sugar (glucose) levels are greatly affected by what you eat and drink. Eating healthy foods in the right amounts, at about the same times every day, can help you:  Control your blood glucose.  Lower your risk of heart disease.  Improve your blood pressure.  Reach or maintain a healthy weight. What can affect my meal plan? Every person with diabetes is  different, and each person has different needs for a meal plan. Your health care provider may recommend that you work with a dietitian to make a meal plan that is best for you. Your meal plan may vary depending on factors such as:  The calories you need.  The medicines you take.  Your weight.  Your blood glucose, blood pressure, and cholesterol levels.  Your activity level.  Other health conditions you have, such as heart or kidney disease. How do carbohydrates affect me? Carbohydrates, also called carbs, affect  your blood glucose level more than any other type of food. Eating carbs naturally raises the amount of glucose in your blood. Carb counting is a method for keeping track of how many carbs you eat. Counting carbs is important to keep your blood glucose at a healthy level, especially if you use insulin or take certain oral diabetes medicines. It is important to know how many carbs you can safely have in each meal. This is different for every person. Your dietitian can help you calculate how many carbs you should have at each meal and for each snack. How does alcohol affect me? Alcohol can cause a sudden decrease in blood glucose (hypoglycemia), especially if you use insulin or take certain oral diabetes medicines. Hypoglycemia can be a life-threatening condition. Symptoms of hypoglycemia, such as sleepiness, dizziness, and confusion, are similar to symptoms of having too much alcohol.  Do not drink alcohol if: ? Your health care provider tells you not to drink. ? You are pregnant, may be pregnant, or are planning to become pregnant.  If you drink alcohol: ? Do not drink on an empty stomach. ? Limit how much you use to:  0-1 drink a day for women.  0-2 drinks a day for men. ? Be aware of how much alcohol is in your drink. In the [place] one drink equals one 12 oz bottle of beer (355 mL), one 5 oz glass of wine (148 mL), or one 1 oz glass of hard liquor (44 mL). ? Keep yourself  hydrated with water, diet soda, or unsweetened iced tea.  Keep in mind that regular soda, juice, and other mixers may contain a lot of sugar and must be counted as carbs. What are tips for following this plan? Reading food labels  Start by checking the serving size on the "Nutrition Facts" label of packaged foods and drinks. The amount of calories, carbs, fats, and other nutrients listed on the label is based on one serving of the item. Many items contain more than one serving per package.  Check the total grams (g) of carbs in one serving. You can calculate the number of servings of carbs in one serving by dividing the total carbs by 15. For example, if a food has 30 g of total carbs per serving, it would be equal to 2 servings of carbs.  Check the number of grams (g) of saturated fats and trans fats in one serving. Choose foods that have a low amount or none of these fats.  Check the number of milligrams (mg) of salt (sodium) in one serving. Most people should limit total sodium intake to less than 2,300 mg per day.  Always check the nutrition information of foods labeled as "low-fat" or "nonfat." These foods may be higher in added sugar or refined carbs and should be avoided.  Talk to your dietitian to identify your daily goals for nutrients listed on the label. Shopping  Avoid buying canned, pre-made, or processed foods. These foods tend to be high in fat, sodium, and added sugar.  Shop around the outside edge of the grocery store. This is where you will most often find fresh fruits and vegetables, bulk grains, fresh meats, and fresh dairy. Cooking  Use low-heat cooking methods, such as baking, instead of high-heat cooking methods like deep frying.  Cook using healthy oils, such as olive, canola, or sunflower oil.  Avoid cooking with butter, cream, or high-fat meats. Meal planning  Eat meals and snacks regularly, preferably at the same  times every day. Avoid going long periods of  time without eating.  Eat foods that are high in fiber, such as fresh fruits, vegetables, beans, and whole grains. Talk with your dietitian about how many servings of carbs you can eat at each meal.  Eat 4-6 oz (112-168 g) of lean protein each day, such as lean meat, chicken, fish, eggs, or tofu. One ounce (oz) of lean protein is equal to: ? 1 oz (28 g) of meat, chicken, or fish. ? 1 egg. ?  cup (62 g) of tofu.  Eat some foods each day that contain healthy fats, such as avocado, nuts, seeds, and fish.   What foods should I eat? Fruits Berries. Apples. Oranges. Peaches. Apricots. Plums. Grapes. Mango. Papaya. Pomegranate. Kiwi. Cherries. Vegetables Lettuce. Spinach. Leafy greens, including kale, chard, collard greens, and mustard greens. Beets. Cauliflower. Cabbage. Broccoli. Carrots. Green beans. Tomatoes. Peppers. Onions. Cucumbers. Brussels sprouts. Grains Whole grains, such as whole-wheat or whole-grain bread, crackers, tortillas, cereal, and pasta. Unsweetened oatmeal. Quinoa. Fadumo Heng or wild rice. Meats and other proteins Seafood. Poultry without skin. Lean cuts of poultry and beef. Tofu. Nuts. Seeds. Dairy Low-fat or fat-free dairy products such as milk, yogurt, and cheese. The items listed above may not be a complete list of foods and beverages you can eat. Contact a dietitian for more information. What foods should I avoid? Fruits Fruits canned with syrup. Vegetables Canned vegetables. Frozen vegetables with butter or cream sauce. Grains Refined white flour and flour products such as bread, pasta, snack foods, and cereals. Avoid all processed foods. Meats and other proteins Fatty cuts of meat. Poultry with skin. Breaded or fried meats. Processed meat. Avoid saturated fats. Dairy Full-fat yogurt, cheese, or milk. Beverages Sweetened drinks, such as soda or iced tea. The items listed above may not be a complete list of foods and beverages you should avoid. Contact a dietitian  for more information. Questions to ask a health care provider  Do I need to meet with a diabetes educator?  Do I need to meet with a dietitian?  What number can I call if I have questions?  When are the best times to check my blood glucose? Where to find more information:  American Diabetes Association: diabetes.org  Academy of Nutrition and Dietetics: www.eatright.[employer] of Diabetes and Digestive and Kidney Diseases: [employer]Funeral.dk  Association of Diabetes Care and Education Specialists: www.diabeteseducator.org Summary  It is important to have healthy eating habits because your blood sugar (glucose) levels are greatly affected by what you eat and drink.  A healthy meal plan will help you control your blood glucose and maintain a healthy lifestyle.  Your health care provider may recommend that you work with a dietitian to make a meal plan that is best for you.  Keep in mind that carbohydrates (carbs) and alcohol have immediate effects on your blood glucose levels. It is important to count carbs and to use alcohol carefully. This information is not intended to replace advice given to you by your health care provider. Make sure you discuss any questions you have with your health care provider. Document Revised: 06/10/2019 Document Reviewed: 06/10/2019 Elsevier Patient Education  2021 [place].  [email or web address]">  DASH Eating Plan DASH stands for Dietary Approaches to Stop Hypertension. The DASH eating plan is a healthy eating plan that has been shown to:  Reduce high blood pressure (hypertension).  Reduce your risk for type 2 diabetes, heart disease, and stroke.  Help with weight loss. What are  tips for following this plan? Reading food labels  Check food labels for the amount of salt (sodium) per serving. Choose foods with less than 5 percent of the Daily Value of sodium. Generally, foods with less  than 300 milligrams (mg) of sodium per serving fit into this eating plan.  To find whole grains, look for the word "whole" as the first word in the ingredient list. Shopping  Buy products labeled as "low-sodium" or "no salt added."  Buy fresh foods. Avoid canned foods and pre-made or frozen meals. Cooking  Avoid adding salt when cooking. Use salt-free seasonings or herbs instead of table salt or sea salt. Check with your health care provider or pharmacist before using salt substitutes.  Do not fry foods. Cook foods using healthy methods such as baking, boiling, grilling, roasting, and broiling instead.  Cook with heart-healthy oils, such as olive, canola, avocado, soybean, or sunflower oil. Meal planning  Eat a balanced diet that includes: ? 4 or more servings of fruits and 4 or more servings of vegetables each day. Try to fill one-half of your plate with fruits and vegetables. ? 6-8 servings of whole grains each day. ? Less than 6 oz (170 g) of lean meat, poultry, or fish each day. A 3-oz (85-g) serving of meat is about the same size as a deck of cards. One egg equals 1 oz (28 g). ? 2-3 servings of low-fat dairy each day. One serving is 1 cup (237 mL). ? 1 serving of nuts, seeds, or beans 5 times each week. ? 2-3 servings of heart-healthy fats. Healthy fats called omega-3 fatty acids are found in foods such as walnuts, flaxseeds, fortified milks, and eggs. These fats are also found in cold-water fish, such as sardines, salmon, and mackerel.  Limit how much you eat of: ? Canned or prepackaged foods. ? Food that is high in trans fat, such as some fried foods. ? Food that is high in saturated fat, such as fatty meat. ? Desserts and other sweets, sugary drinks, and other foods with added sugar. ? Full-fat dairy products.  Do not salt foods before eating.  Do not eat more than 4 egg yolks a week.  Try to eat at least 2 vegetarian meals a week.  Eat more home-cooked food and less  restaurant, buffet, and fast food.   Lifestyle  When eating at a restaurant, ask that your food be prepared with less salt or no salt, if possible.  If you drink alcohol: ? Limit how much you use to:  0-1 drink a day for women who are not pregnant.  0-2 drinks a day for men. ? Be aware of how much alcohol is in your drink. In the [place] one drink equals one 12 oz bottle of beer (355 mL), one 5 oz glass of wine (148 mL), or one 1 oz glass of hard liquor (44 mL). General information  Avoid eating more than 2,300 mg of salt a day. If you have hypertension, you may need to reduce your sodium intake to 1,500 mg a day.  Work with your health care provider to maintain a healthy body weight or to lose weight. Ask what an ideal weight is for you.  Get at least 30 minutes of exercise that causes your heart to beat faster (aerobic exercise) most days of the week. Activities may include walking, swimming, or biking.  Work with your health care provider or dietitian to adjust your eating plan to your individual calorie needs. What foods  should I eat? Fruits All fresh, dried, or frozen fruit. Canned fruit in natural juice (without added sugar). Vegetables Fresh or frozen vegetables (raw, steamed, roasted, or grilled). Low-sodium or reduced-sodium tomato and vegetable juice. Low-sodium or reduced-sodium tomato sauce and tomato paste. Low-sodium or reduced-sodium canned vegetables. Grains Whole-grain or whole-wheat bread. Whole-grain or whole-wheat pasta. Alyanna Stoermer rice. [place] Morrow. Bulgur. Whole-grain and low-sodium cereals. Pita bread. Low-fat, low-sodium crackers. Whole-wheat flour tortillas. Meats and other proteins Skinless chicken or [place]. Ground chicken or [place]. Pork with fat trimmed off. Fish and seafood. Egg whites. Dried beans, peas, or lentils. Unsalted nuts, nut butters, and seeds. Unsalted canned beans. Lean cuts of beef with fat trimmed off. Low-sodium, lean precooked or cured meat,  such as sausages or meat loaves. Dairy Low-fat (1%) or fat-free (skim) milk. Reduced-fat, low-fat, or fat-free cheeses. Nonfat, low-sodium ricotta or cottage cheese. Low-fat or nonfat yogurt. Low-fat, low-sodium cheese. Fats and oils Soft margarine without trans fats. Vegetable oil. Reduced-fat, low-fat, or light mayonnaise and salad dressings (reduced-sodium). Canola, safflower, olive, avocado, soybean, and sunflower oils. Avocado. Seasonings and condiments Herbs. Spices. Seasoning mixes without salt. Other foods Unsalted popcorn and pretzels. Fat-free sweets. The items listed above may not be a complete list of foods and beverages you can eat. Contact a dietitian for more information. What foods should I avoid? Fruits Canned fruit in a light or heavy syrup. Fried fruit. Fruit in cream or butter sauce. Vegetables Creamed or fried vegetables. Vegetables in a cheese sauce. Regular canned vegetables (not low-sodium or reduced-sodium). Regular canned tomato sauce and paste (not low-sodium or reduced-sodium). Regular tomato and vegetable juice (not low-sodium or reduced-sodium). Angie Fava. Olives. Grains Baked goods made with fat, such as croissants, muffins, or some breads. Dry pasta or rice meal packs. Meats and other proteins Fatty cuts of meat. Ribs. Fried meat. Berniece Salines. Bologna, salami, and other precooked or cured meats, such as sausages or meat loaves. Fat from the back of a pig (fatback). Bratwurst. Salted nuts and seeds. Canned beans with added salt. Canned or smoked fish. Whole eggs or egg yolks. Chicken or [place] with skin. Dairy Whole or 2% milk, cream, and half-and-half. Whole or full-fat cream cheese. Whole-fat or sweetened yogurt. Full-fat cheese. Nondairy creamers. Whipped toppings. Processed cheese and cheese spreads. Fats and oils Butter. Stick margarine. Lard. Shortening. Ghee. Bacon fat. Tropical oils, such as coconut, palm kernel, or palm oil. Seasonings and condiments Onion  salt, garlic salt, seasoned salt, table salt, and sea salt. Worcestershire sauce. Tartar sauce. Barbecue sauce. Teriyaki sauce. Soy sauce, including reduced-sodium. Steak sauce. Canned and packaged gravies. Fish sauce. Oyster sauce. Cocktail sauce. Store-bought horseradish. Ketchup. Mustard. Meat flavorings and tenderizers. Bouillon cubes. Hot sauces. Pre-made or packaged marinades. Pre-made or packaged taco seasonings. Relishes. Regular salad dressings. Other foods Salted popcorn and pretzels. The items listed above may not be a complete list of foods and beverages you should avoid. Contact a dietitian for more information. Where to find more information  National Heart, Lung, and Blood Institute: https://wilson-eaton.com/  American Heart Association: www.heart.org  Academy of Nutrition and Dietetics: www.eatright.[place]: www.kidney.org Summary  The DASH eating plan is a healthy eating plan that has been shown to reduce high blood pressure (hypertension). It may also reduce your risk for type 2 diabetes, heart disease, and stroke.  When on the DASH eating plan, aim to eat more fresh fruits and vegetables, whole grains, lean proteins, low-fat dairy, and heart-healthy fats.  With the DASH eating plan, you should  limit salt (sodium) intake to 2,300 mg a day. If you have hypertension, you may need to reduce your sodium intake to 1,500 mg a day.  Work with your health care provider or dietitian to adjust your eating plan to your individual calorie needs. This information is not intended to replace advice given to you by your health care provider. Make sure you discuss any questions you have with your health care provider. Document Revised: 06/06/2019 Document Reviewed: 06/06/2019 Elsevier Patient Education  2021 [employer].

## 2020-09-01 ENCOUNTER — Telehealth: Payer: Self-pay

## 2020-09-01 NOTE — Progress Notes (Signed)
Chronic Care Management Pharmacy Assistant   Name: April Munoz  MRN: [id number] DOB: 06-18-1951  Reason for Encounter: Medication Review for Eliquis PAP    PCP : [name], MD  Allergies:   Allergies  Allergen Reactions  . Prednisone     In high doses causes her to feel crazy    Medications: Outpatient Encounter Medications as of 09/01/2020  Medication Sig  . acyclovir (ZOVIRAX) 800 MG tablet TAKE ONE (1) TABLET FOUR (4) TIMES DAILYAS NEEDED FOR SHINGLES  . atorvastatin (LIPITOR) 10 MG tablet Take 1 tablet (10 mg total) by mouth daily.  . B Complex-C-Folic Acid TABS Take 1 tablet by mouth daily.  . Biotin 1000 MCG tablet Take 1,000 mcg by mouth daily.  . Calcium-Vitamin D-Vitamin K (VIACTIV CALCIUM PLUS D) 650-12.5-40 MG-MCG-MCG CHEW Chew 1 capsule by mouth daily.  . cyanocobalamin (,VITAMIN B-12,) 1000 MCG/ML injection Inject 1 mL (1,000 mcg total) into the muscle every 30 (thirty) days.  Marland Kitchen ELIQUIS 5 MG TABS tablet TAKE ONE TABLET TWICE DAILY  . EPINEPHrine 0.3 mg/0.3 mL IJ SOAJ injection Inject into the muscle as needed.  Marland Kitchen levothyroxine (SYNTHROID) 88 MCG tablet Take 1 tablet (88 mcg total) by mouth daily before breakfast.  . losartan (COZAAR) 50 MG tablet TAKE ONE (1) TABLET BY MOUTH ONCE DAILY  . Melatonin 10 MG TABS Take 1 tablet by mouth at bedtime.  . metFORMIN (GLUCOPHAGE) 1000 MG tablet TAKE ONE TABLET TWICE DAILY WITH MEALS  . metoprolol succinate (TOPROL XL) 25 MG 24 hr tablet Take 1 tablet (25 mg total) by mouth in the morning and at bedtime.  . Multiple Vitamin (MULTIVITAMIN WITH MINERALS) TABS tablet Take 1 tablet by mouth daily.  Marland Kitchen omeprazole (PRILOSEC) 20 MG capsule TAKE ONE (1) CAPSULE EACH DAY (Patient taking differently: Take 20 mg by mouth every evening.)  . Probiotic Product (PROBIOTIC & ACIDOPHILUS EX ST PO) Take 1 tablet by mouth daily.   Marland Kitchen torsemide (DEMADEX) 20 MG tablet Take 1 tablet (20 mg total) by mouth 3 (three) times a week. Please take this  on Monday, Wednesday, and Friday.  . tretinoin (RETIN-A) 0.05 % cream Apply 1 application topically 3 times/day as needed-between meals & bedtime.  Marland Kitchen venlafaxine XR (EFFEXOR-XR) 150 MG 24 hr capsule Take 1 capsule (150 mg total) by mouth daily with breakfast.  . Vitamin D, Ergocalciferol, (DRISDOL) 1.25 MG (50000 UNIT) CAPS capsule Take 1 capsule (50,000 Units total) by mouth every Monday.  . Zinc Sulfate (ZINC 15 PO) Take 1 tablet by mouth daily.  Marland Kitchen zolpidem (AMBIEN) 10 MG tablet Take 1 tablet (10 mg total) by mouth at bedtime as needed for sleep.   No facility-administered encounter medications on file as of 09/01/2020.    Current Diagnosis: Patient Active Problem List   Diagnosis Date Noted  . Hypertension   . Diabetes mellitus without complication ([place])   . Depression   . Atrophy of thyroid   . Atrial fibrillation ([place])   . Anxiety   . Arthritis   . Ataxic gait   . Full dentures   . Incontinence   . Insomnia   . Other amnesia   . 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   . Primary insomnia   . Wears glasses   . Fatigue 01/20/2020  . Diabetic glomerulopathy ([place]) 09/16/2019  . Urge incontinence of urine 09/16/2019  . Secondary hypothyroidism 09/16/2019  . Longstanding persistent atrial fibrillation ([place]) 09/16/2019  . Depression, major, recurrent, mild ([place])  09/16/2019  . Mild vitamin D deficiency 09/16/2019  . Chronic anticoagulation 05/31/2018  . High risk medication use 11/07/2017  . B12 deficiency 10/31/2017  . Neuropathy in diabetes ([place]) 10/31/2017  . Esophageal stricture 06/05/2017  . Cellulitis of abdominal wall 07/07/2016  . Murmur, cardiac 08/15/2015  . Paroxysmal atrial fibrillation ([place]) 08/15/2015  . Restless legs syndrome 03/14/2013  . Hypertensive heart disease 02/24/2013  . Fibromyalgia 02/24/2013  . Hereditary and idiopathic peripheral neuropathy 02/24/2013  . Mixed hyperlipidemia 02/24/2013  . Osteoporosis 02/24/2013  .  Thoracic or lumbosacral neuritis or radiculitis 02/24/2013  . Essential hypertension 02/24/2013  . Hyperlipidemia 02/24/2013  . Type 2 diabetes mellitus, without long-term current use of insulin ([place]) 02/24/2013    I called the patient to get her household monthly income and expenses to complete her request form, the patient has a dentist appointment and will call me later today.  She called and left April Munoz, CPP a message that her yearly income is [phone] Her husbands is [phone]  I have completed her form for eliquis and submitted it to April Munoz, CPP for signatures and approval.  Follow-Up:  Patient Assistance Coordination and Pharmacist Review  April Munoz, CPP notified  April Munoz, [place] Pharmacist Assistant [phone]

## 2020-09-03 ENCOUNTER — Ambulatory Visit (INDEPENDENT_AMBULATORY_CARE_PROVIDER_SITE_OTHER): Payer: Medicare Other | Admitting: Family Medicine

## 2020-09-03 ENCOUNTER — Other Ambulatory Visit: Payer: Self-pay

## 2020-09-03 VITALS — BP 110/74 | HR 60 | Temp 97.3°F | Resp 16 | Ht 68.0 in | Wt 195.0 lb

## 2020-09-03 DIAGNOSIS — E038 Other specified hypothyroidism: Secondary | ICD-10-CM

## 2020-09-03 DIAGNOSIS — F331 Major depressive disorder, recurrent, moderate: Secondary | ICD-10-CM | POA: Diagnosis not present

## 2020-09-03 DIAGNOSIS — I4811 Longstanding persistent atrial fibrillation: Secondary | ICD-10-CM | POA: Diagnosis not present

## 2020-09-03 DIAGNOSIS — B029 Zoster without complications: Secondary | ICD-10-CM

## 2020-09-03 DIAGNOSIS — E559 Vitamin D deficiency, unspecified: Secondary | ICD-10-CM | POA: Diagnosis not present

## 2020-09-03 DIAGNOSIS — E782 Mixed hyperlipidemia: Secondary | ICD-10-CM

## 2020-09-03 DIAGNOSIS — E1121 Type 2 diabetes mellitus with diabetic nephropathy: Secondary | ICD-10-CM | POA: Diagnosis not present

## 2020-09-03 DIAGNOSIS — S300XXA Contusion of lower back and pelvis, initial encounter: Secondary | ICD-10-CM | POA: Diagnosis not present

## 2020-09-03 DIAGNOSIS — I110 Hypertensive heart disease with heart failure: Secondary | ICD-10-CM

## 2020-09-03 DIAGNOSIS — E538 Deficiency of other specified B group vitamins: Secondary | ICD-10-CM

## 2020-09-03 MED ORDER — EPINEPHRINE 0.3 MG/0.3ML IJ SOAJ: 0.3000 mg | INTRAMUSCULAR | 1 refills | Status: DC | PRN

## 2020-09-03 MED ORDER — CYANOCOBALAMIN 1000 MCG/ML IJ SOLN: 1000.0000 ug | INTRAMUSCULAR | 2 refills | Status: DC

## 2020-09-03 MED ORDER — PREDNISONE 10 MG PO TABS: ORAL_TABLET | ORAL | 0 refills | Status: AC

## 2020-09-03 NOTE — Patient Instructions (Signed)
Prednisone taper given.  Avoid direct contact when sitting.  Decrease effexor xr 150 mg one every other day x 1 week, then discontinue.  At the same time start trintillex 10 mg once daily.

## 2020-09-03 NOTE — Progress Notes (Signed)
Subjective:  Patient ID: April Munoz, female    DOB: [birth year]  Age: 70 y.o. MRN: [id number]  Chief Complaint  Patient presents with  . Rash    HPI Pt had a fall on Saturday in kitchen. Pt landed directly on her tailbone. Very painful. No passing out. Pt has no idea what made her fall.   ON Tuesday had shingles come up. Has an outbreak 3 x year and is brought on by stress. Takes acyclovir 800 mg four times a day x until resolves. Usually blisters, but has not. Patient is adjusting how she sits.  Longstanding atrial fibrillation: on eliquis.  Hyperlipidemia: eating healthy. Not exercising.  Anemia: Hb 10.9. at last visit.  Hypothyroidism: Currently on synthroid 88 mcg once daily.  GERD: on omeprazole 20 mg once daily at night. Depression: On venlafaxine xr 150 mg once daily in am. Not doing well. Increased stress. There adult son has moved home. Their daughter has just left her husband 3 weeks ago without any discussion with her husband or family.  Insomnia: On ambien prn.  Pulmonary nodule. Found on ct scan. Had not changed. Is seeing pulmonary.  Cardiology: sees Dr. Bettina Gavia.   Current Outpatient Medications on File Prior to Visit  Medication Sig Dispense Refill  . acyclovir (ZOVIRAX) 800 MG tablet TAKE ONE (1) TABLET FOUR (4) TIMES DAILYAS NEEDED FOR SHINGLES 120 tablet 0  . atorvastatin (LIPITOR) 10 MG tablet Take 1 tablet (10 mg total) by mouth daily. 90 tablet 0  . B Complex-C-Folic Acid TABS Take 1 tablet by mouth daily.    . Biotin 1000 MCG tablet Take 1,000 mcg by mouth daily.    . Calcium-Vitamin D-Vitamin K (VIACTIV CALCIUM PLUS D) 650-12.5-40 MG-MCG-MCG CHEW Chew 1 capsule by mouth daily.    Marland Kitchen ELIQUIS 5 MG TABS tablet TAKE ONE TABLET TWICE DAILY 180 tablet 1  . levothyroxine (SYNTHROID) 88 MCG tablet Take 1 tablet (88 mcg total) by mouth daily before breakfast. 90 tablet 0  . losartan (COZAAR) 50 MG tablet TAKE ONE (1) TABLET BY MOUTH ONCE DAILY 90 tablet 1  . Melatonin 10  MG TABS Take 1 tablet by mouth at bedtime.    . metFORMIN (GLUCOPHAGE) 1000 MG tablet TAKE ONE TABLET TWICE DAILY WITH MEALS 90 tablet 0  . Multiple Vitamin (MULTIVITAMIN WITH MINERALS) TABS tablet Take 1 tablet by mouth daily.    Marland Kitchen omeprazole (PRILOSEC) 20 MG capsule TAKE ONE (1) CAPSULE EACH DAY (Patient taking differently: Take 20 mg by mouth every evening.) 90 capsule 0  . Probiotic Product (PROBIOTIC & ACIDOPHILUS EX ST PO) Take 1 tablet by mouth daily.     Marland Kitchen torsemide (DEMADEX) 20 MG tablet Take 1 tablet (20 mg total) by mouth 3 (three) times a week. Please take this on Monday, Wednesday, and Friday. 90 tablet 0  . tretinoin (RETIN-A) 0.05 % cream Apply 1 application topically 3 times/day as needed-between meals & bedtime.    Marland Kitchen venlafaxine XR (EFFEXOR-XR) 150 MG 24 hr capsule Take 1 capsule (150 mg total) by mouth daily with breakfast. 90 capsule 0  . Vitamin D, Ergocalciferol, (DRISDOL) 1.25 MG (50000 UNIT) CAPS capsule Take 1 capsule (50,000 Units total) by mouth every Monday. 12 capsule 1  . Zinc Sulfate (ZINC 15 PO) Take 1 tablet by mouth daily.    Marland Kitchen zolpidem (AMBIEN) 10 MG tablet Take 1 tablet (10 mg total) by mouth at bedtime as needed for sleep. 30 tablet 2   No current facility-administered medications on  file prior to visit.   Past Medical History:  Diagnosis Date  . Anxiety   . Arthritis   . Ataxic gait   . Atrial fibrillation ([place])   . Atrophy of thyroid   . B12 deficiency 10/31/2017  . Cellulitis of abdominal wall 07/07/2016  . Chronic anticoagulation 05/31/2018  . Depression   . Depression, major, recurrent, mild ([place]) 09/16/2019  . Diabetes mellitus without complication ([place])   . Diabetic glomerulopathy ([place]) 09/16/2019  . Esophageal stricture 06/05/2017  . Essential hypertension 02/24/2013  . Fatigue 01/20/2020  . Fibromyalgia 02/24/2013  . Full dentures   . Hereditary and idiopathic peripheral neuropathy 02/24/2013  . High risk medication use 11/07/2017  . Hyperlipidemia  02/24/2013  . Hypertension   . Hypertensive heart disease 02/24/2013  . Incontinence   . Insomnia   . Longstanding persistent atrial fibrillation ([place]) 09/16/2019  . Mild vitamin D deficiency 09/16/2019  . Mixed hyperlipidemia 02/24/2013  . Murmur, cardiac 08/15/2015  . Neuropathy in diabetes ([place]) 10/31/2017  . Osteoporosis   . Other amnesia   . Other amnesia   . 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   . Paroxysmal atrial fibrillation ([place]) 08/15/2015   CHADS2vasc=3 CHADS2vasc=3  . Primary insomnia   . Restless legs syndrome 03/14/2013  . Secondary hypothyroidism 09/16/2019  . Thoracic or lumbosacral neuritis or radiculitis 02/24/2013  . Type 2 diabetes mellitus, without long-term current use of insulin ([place]) 02/24/2013  . Urge incontinence of urine 09/16/2019  . Wears glasses    Past Surgical History:  Procedure Laterality Date  . A-FLUTTER ABLATION N/A 07/30/2019   Procedure: A-FLUTTER ABLATION;  Surgeon: Constance Haw, MD;  Location: [facility];  Service: Cardiovascular;  Laterality: N/A;  . ATRIAL FIBRILLATION ABLATION N/A 03/21/2019   Procedure: ATRIAL FIBRILLATION ABLATION;  Surgeon: Constance Haw, MD;  Location: [facility];  Service: Cardiovascular;  Laterality: N/A;  . BREAST REDUCTION SURGERY  1983  . CATARACT EXTRACTION, BILATERAL  2018, 2019  . CHOLECYSTECTOMY  1971   open  . DIAGNOSTIC LAPAROSCOPY  2010   lysis of adhesions  . DILATION AND CURETTAGE OF UTERUS  2004  . KNEE ARTHROSCOPY  1998   left  . KNEE ARTHROSCOPY W/ LATERAL RETINACULAR REPAIR    . MASS EXCISION Left 11/11/2013   Procedure: EXCISION MUCOID CYST LEFT INDEX FINGER/DEBRIDEMENT LEFT INDEX FINGER;  Surgeon: Wynonia Sours, MD;  Location: [facility];  Service: Orthopedics;  Laterality: Left;  ANESTHESIA: IV REGIONAL/FAB  . SHOULDER ARTHROSCOPY W/ ROTATOR CUFF REPAIR  2007   left  . TRIGGER FINGER RELEASE Left 11/11/2013   Procedure: RELEASE A-1  PULLEY LEFT RING FINGER;  Surgeon: Wynonia Sours, MD;  Location: [facility];  Service: Orthopedics;  Laterality: Left;  . UMBILICAL HERNIA REPAIR  2008, 2010    Family History  Problem Relation Age of Onset  . Heart failure Father   . Heart disease Brother   . Heart attack Brother   . Heart failure Paternal Grandfather   . Stroke Paternal Grandfather   . Atrial fibrillation Mother   . Heart attack Mother   . Diabetes Maternal Grandmother    Social History   Socioeconomic History  . Marital status: Married    Spouse name: Not on file  . Number of children: Not on file  . Years of education: Not on file  . Highest education level: Not on file  Occupational History  . Occupation: retired  Tobacco Use  . Smoking status: Never Smoker  . Smokeless tobacco: Never Used  Vaping Use  . Vaping Use: Never used  Substance and Sexual Activity  . Alcohol use: Yes    Alcohol/week: 1.0 - 2.0 standard drink    Types: 1 - 2 Glasses of wine per week    Comment: 4 times a week  . Drug use: No  . Sexual activity: Not on file  Other Topics Concern  . Not on file  Social History Narrative  . Not on file   Social Determinants of Health   Financial Resource Strain: Not on file  Food Insecurity: No Food Insecurity  . Worried About Charity fundraiser in the Last Year: Never true  . Ran Out of Food in the Last Year: Never true  Transportation Needs: No Transportation Needs  . Lack of Transportation (Medical): No  . Lack of Transportation (Non-Medical): No  Physical Activity: Not on file  Stress: Not on file  Social Connections: Not on file    Review of Systems  Constitutional: Negative for chills, fatigue and fever.  HENT: Negative for congestion, ear pain, rhinorrhea and sore throat.   Respiratory: Positive for shortness of breath. Negative for cough.   Cardiovascular: Positive for palpitations (atrial fibrillation). Negative for chest pain.  Gastrointestinal: Negative  for abdominal pain, constipation, diarrhea, nausea and vomiting.  Genitourinary: Negative for dysuria, frequency and urgency.  Musculoskeletal: Positive for back pain and myalgias.  Neurological: Positive for dizziness. Negative for weakness, light-headedness and headaches.  Psychiatric/Behavioral: Negative for dysphoric mood. The patient is nervous/anxious.      Objective:  BP 110/74   Pulse 60   Temp (!) 97.3 F (36.3 C)   Resp 16   Ht 5\' 8"  (1.727 m)   Wt 195 lb (88.5 kg)   BMI 29.65 kg/m   BP/Weight 09/03/2020 08/05/2020 20/94/7096  Systolic BP 283 662 947  Diastolic BP 74 80 70  Wt. (Lbs) 195 196.6 195.6  BMI 29.65 29.89 29.74    Physical Exam Vitals reviewed.  Constitutional:      Appearance: Normal appearance.  Neck:     Vascular: No carotid bruit.  Cardiovascular:     Rate and Rhythm: Normal rate and regular rhythm.     Heart sounds: Normal heart sounds.  Pulmonary:     Effort: Pulmonary effort is normal.     Breath sounds: Normal breath sounds.  Musculoskeletal:        General: Tenderness (sacrum and BL buttocks. ) present.  Skin:    Findings: No rash (mild pink lesions on sacrum - 4 lesions. no blisters. ).  Neurological:     Mental Status: She is alert and oriented to person, place, and time.  Psychiatric:        Mood and Affect: Mood normal.        Behavior: Behavior normal.    Lab Results  Component Value Date   WBC 8.7 09/03/2020   HGB 9.9 (L) 09/03/2020   HCT 31.4 (L) 09/03/2020   PLT 399 09/03/2020   GLUCOSE 121 (H) 09/03/2020   CHOL 156 09/03/2020   TRIG 153 (H) 09/03/2020   HDL 57 09/03/2020   LDLCALC 73 09/03/2020   ALT 25 09/03/2020   AST 22 09/03/2020   NA 141 09/03/2020   K 5.5 (H) 09/03/2020   CL 104 09/03/2020   CREATININE 1.06 (H) 09/03/2020   BUN 17 09/03/2020   CO2 23 09/03/2020   TSH 5.250 (H) 05/31/2020  HGBA1C 7.1 (H) 09/03/2020   MICROALBUR 150 05/27/2020      Assessment & Plan:   1. Diabetic glomerulopathy  (HCC) Control: fairly well controlled Recommend check sugars fasting daily. Recommend check feet daily. Recommend annual eye exams. Medicines: Start on farxiga 5 mg once daily.  Continue to work on eating a healthy diet and exercise.  Labs drawn today.   - Hemoglobin A1c  2. Hypertensive heart disease with congestive heart failure, unspecified heart failure type [facility]) The current medical regimen is effective;  continue present plan and medications. Recommend continue to work on eating healthy diet and exercise. - CBC with Differential/Platelet - Comprehensive metabolic panel - Lipid panel  3. Mixed hyperlipidemia Well controlled.  No changes to medicines.  Continue to work on eating a healthy diet and exercise.   4. Moderate recurrent major depression (HCC) Poorly controlled. Decrease effexor xr 150 mg one every other day x 1 week, then discontinue.  At the same time start trintillex 10 mg once daily.   5. Secondary hypothyroidism The current medical regimen is effective;  continue synthroid.  6. Longstanding persistent atrial fibrillation (HCC) The current medical regimen is effective;  continue present plan and medications. ON eliquis.   7. Mild vitamin D deficiency Well controlled.  - Vitamin D level.  8. Contusion of sacrum, initial encounter Avoid direct contact when sitting.   9. Herpes zoster without complication Prednisone taper given.    Meds ordered this encounter  Medications  . predniSONE (DELTASONE) 10 MG tablet    Sig: Take 6 tablets (60 mg total) by mouth daily with breakfast for 1 day, THEN 5 tablets (50 mg total) daily with breakfast for 1 day, THEN 4 tablets (40 mg total) daily with breakfast for 1 day, THEN 3 tablets (30 mg total) daily with breakfast for 1 day, THEN 2 tablets (20 mg total) daily with breakfast for 1 day, THEN 1 tablet (10 mg total) daily with breakfast for 1 day.    Dispense:  21 tablet    Refill:  0    Orders Placed This  Encounter  Procedures  . CBC with Differential/Platelet  . Comprehensive metabolic panel  . Hemoglobin A1c  . Lipid panel  . VITAMIN D 25 Hydroxy (Vit-D Deficiency, Fractures)  . Cardiovascular Risk Assessment    Follow-up: Return in about 4 weeks (around 10/01/2020) for depression.  An After Visit Summary was printed and given to the patient.  [name], MD [facility] [phone]

## 2020-09-04 LAB — CBC WITH DIFFERENTIAL/PLATELET
Basophils Absolute: 0.1 10*3/uL (ref 0.0–0.2)
Basos: 1 %
EOS (ABSOLUTE): 0.4 10*3/uL (ref 0.0–0.4)
Eos: 5 %
Hematocrit: 31.4 % — ABNORMAL LOW (ref 34.0–46.6)
Hemoglobin: 9.9 g/dL — ABNORMAL LOW (ref 11.1–15.9)
Immature Grans (Abs): 0.1 10*3/uL (ref 0.0–0.1)
Immature Granulocytes: 1 %
Lymphocytes Absolute: 2.6 10*3/uL (ref 0.7–3.1)
Lymphs: 30 %
MCH: 26.3 pg — ABNORMAL LOW (ref 26.6–33.0)
MCHC: 31.5 g/dL (ref 31.5–35.7)
MCV: 83 fL (ref 79–97)
Monocytes Absolute: 0.8 10*3/uL (ref 0.1–0.9)
Monocytes: 9 %
Neutrophils Absolute: 4.8 10*3/uL (ref 1.4–7.0)
Neutrophils: 54 %
Platelets: 399 10*3/uL (ref 150–450)
RBC: 3.77 x10E6/uL (ref 3.77–5.28)
RDW: 14 % (ref 11.7–15.4)
WBC: 8.7 10*3/uL (ref 3.4–10.8)

## 2020-09-04 LAB — COMPREHENSIVE METABOLIC PANEL
ALT: 25 IU/L (ref 0–32)
AST: 22 IU/L (ref 0–40)
Albumin/Globulin Ratio: 1.6 (ref 1.2–2.2)
Albumin: 4.1 g/dL (ref 3.7–4.7)
Alkaline Phosphatase: 76 IU/L (ref 44–121)
BUN/Creatinine Ratio: 16 (ref 12–28)
BUN: 17 mg/dL (ref 8–27)
Bilirubin Total: 0.5 mg/dL (ref 0.0–1.2)
CO2: 23 mmol/L (ref 20–29)
Calcium: 9.1 mg/dL (ref 8.7–10.3)
Chloride: 104 mmol/L (ref 96–106)
Creatinine, Ser: 1.06 mg/dL — ABNORMAL HIGH (ref 0.57–1.00)
GFR calc Af Amer: 61 mL/min/{1.73_m2} (ref >59)
GFR calc non Af Amer: 53 mL/min/{1.73_m2} — ABNORMAL LOW (ref >59)
Globulin, Total: 2.5 g/dL (ref 1.5–4.5)
Glucose: 121 mg/dL — ABNORMAL HIGH (ref 65–99)
Potassium: 5.5 mmol/L — ABNORMAL HIGH (ref 3.5–5.2)
Sodium: 141 mmol/L (ref 134–144)
Total Protein: 6.6 g/dL (ref 6.0–8.5)

## 2020-09-04 LAB — HEMOGLOBIN A1C
Est. average glucose Bld gHb Est-mCnc: 157 mg/dL
Hgb A1c MFr Bld: 7.1 % — ABNORMAL HIGH (ref 4.8–5.6)

## 2020-09-04 LAB — VITAMIN D 25 HYDROXY (VIT D DEFICIENCY, FRACTURES): Vit D, 25-Hydroxy: 49.8 ng/mL (ref 30.0–100.0)

## 2020-09-04 LAB — CARDIOVASCULAR RISK ASSESSMENT

## 2020-09-04 LAB — LIPID PANEL
Chol/HDL Ratio: 2.7 ratio (ref 0.0–4.4)
Cholesterol, Total: 156 mg/dL (ref 100–199)
HDL: 57 mg/dL (ref >39)
LDL Chol Calc (NIH): 73 mg/dL (ref 0–99)
Triglycerides: 153 mg/dL — ABNORMAL HIGH (ref 0–149)
VLDL Cholesterol Cal: 26 mg/dL (ref 5–40)

## 2020-09-06 ENCOUNTER — Other Ambulatory Visit: Payer: Self-pay | Admitting: Cardiology

## 2020-09-06 NOTE — Telephone Encounter (Signed)
Refill sent to pharmacy.   

## 2020-09-09 ENCOUNTER — Encounter: Payer: Self-pay | Admitting: Family Medicine

## 2020-09-10 ENCOUNTER — Other Ambulatory Visit: Payer: Self-pay | Admitting: Family Medicine

## 2020-09-10 MED ORDER — TRULICITY 0.75 MG/0.5ML ~~LOC~~ SOAJ: 0.7500 mg | SUBCUTANEOUS | 0 refills | Status: DC

## 2020-09-13 ENCOUNTER — Telehealth: Payer: Self-pay

## 2020-09-13 NOTE — Progress Notes (Signed)
Chronic Care Management Pharmacy Assistant   Name: April Munoz  MRN: [id number] DOB: 03/29/1951  Reason for Encounter: Medication Review for Eliquis and Trulicity   PCP : [name], MD  Allergies:   Allergies  Allergen Reactions  . Prednisone     In high doses causes her to feel crazy    Medications: Outpatient Encounter Medications as of 09/13/2020  Medication Sig  . acyclovir (ZOVIRAX) 800 MG tablet TAKE ONE (1) TABLET FOUR (4) TIMES DAILYAS NEEDED FOR SHINGLES  . atorvastatin (LIPITOR) 10 MG tablet Take 1 tablet (10 mg total) by mouth daily.  . B Complex-C-Folic Acid TABS Take 1 tablet by mouth daily.  . Biotin 1000 MCG tablet Take 1,000 mcg by mouth daily.  . Calcium-Vitamin D-Vitamin K (VIACTIV CALCIUM PLUS D) 650-12.5-40 MG-MCG-MCG CHEW Chew 1 capsule by mouth daily.  . cyanocobalamin (,VITAMIN B-12,) 1000 MCG/ML injection Inject 1 mL (1,000 mcg total) into the muscle every 30 (thirty) days.  . Dulaglutide (TRULICITY) 0.17 PZ/0.2HE SOPN Inject 0.75 mg into the skin once a week.  Marland Kitchen ELIQUIS 5 MG TABS tablet TAKE ONE TABLET TWICE DAILY  . EPINEPHrine 0.3 mg/0.3 mL IJ SOAJ injection Inject 0.3 mg into the muscle as needed.  Marland Kitchen levothyroxine (SYNTHROID) 88 MCG tablet Take 1 tablet (88 mcg total) by mouth daily before breakfast.  . losartan (COZAAR) 50 MG tablet TAKE ONE (1) TABLET BY MOUTH ONCE DAILY  . Melatonin 10 MG TABS Take 1 tablet by mouth at bedtime.  . metFORMIN (GLUCOPHAGE) 1000 MG tablet TAKE ONE TABLET TWICE DAILY WITH MEALS  . metoprolol succinate (TOPROL-XL) 25 MG 24 hr tablet TAKE 1 TABLET BY MOUTH IN THE MORNING AND 1 TABLET AT BEDTIME.  . Multiple Vitamin (MULTIVITAMIN WITH MINERALS) TABS tablet Take 1 tablet by mouth daily.  Marland Kitchen omeprazole (PRILOSEC) 20 MG capsule TAKE ONE (1) CAPSULE EACH DAY (Patient taking differently: Take 20 mg by mouth every evening.)  . Probiotic Product (PROBIOTIC & ACIDOPHILUS EX ST PO) Take 1 tablet by mouth daily.   Marland Kitchen torsemide  (DEMADEX) 20 MG tablet Take 1 tablet (20 mg total) by mouth 3 (three) times a week. Please take this on Monday, Wednesday, and Friday.  . tretinoin (RETIN-A) 0.05 % cream Apply 1 application topically 3 times/day as needed-between meals & bedtime.  Marland Kitchen venlafaxine XR (EFFEXOR-XR) 150 MG 24 hr capsule Take 1 capsule (150 mg total) by mouth daily with breakfast.  . Vitamin D, Ergocalciferol, (DRISDOL) 1.25 MG (50000 UNIT) CAPS capsule Take 1 capsule (50,000 Units total) by mouth every Monday.  . Zinc Sulfate (ZINC 15 PO) Take 1 tablet by mouth daily.  Marland Kitchen zolpidem (AMBIEN) 10 MG tablet Take 1 tablet (10 mg total) by mouth at bedtime as needed for sleep.   No facility-administered encounter medications on file as of 09/13/2020.    Current Diagnosis: Patient Active Problem List   Diagnosis Date Noted  . Hypertension   . Diabetes mellitus without complication ([place])   . Depression   . Atrophy of thyroid   . Atrial fibrillation ([place])   . Anxiety   . Arthritis   . Ataxic gait   . Full dentures   . Incontinence   . Insomnia   . Other amnesia   . 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   . Primary insomnia   . Wears glasses   . Fatigue 01/20/2020  . Diabetic glomerulopathy ([place]) 09/16/2019  . Urge incontinence of urine 09/16/2019  . Secondary hypothyroidism  09/16/2019  . Longstanding persistent atrial fibrillation ([place]) 09/16/2019  . Depression, major, recurrent, mild ([place]) 09/16/2019  . Mild vitamin D deficiency 09/16/2019  . Chronic anticoagulation 05/31/2018  . High risk medication use 11/07/2017  . B12 deficiency 10/31/2017  . Neuropathy in diabetes ([place]) 10/31/2017  . Esophageal stricture 06/05/2017  . Cellulitis of abdominal wall 07/07/2016  . Murmur, cardiac 08/15/2015  . Paroxysmal atrial fibrillation ([place]) 08/15/2015  . Restless legs syndrome 03/14/2013  . Hypertensive heart disease 02/24/2013  . Fibromyalgia 02/24/2013  . Hereditary and idiopathic  peripheral neuropathy 02/24/2013  . Mixed hyperlipidemia 02/24/2013  . Osteoporosis 02/24/2013  . Thoracic or lumbosacral neuritis or radiculitis 02/24/2013  . Essential hypertension 02/24/2013  . Hyperlipidemia 02/24/2013  . Type 2 diabetes mellitus, without long-term current use of insulin ([place]) 02/24/2013   I called patient to let her know that Dr. Tobie Poet approved her for Trulicity, which we have submitted a PAP form.  Trintellix was approved.    I will call and check on Trullicity and Eliquis PAP status   I called Trulicity and they had not received it, I notified [name], CPP and she is resending on 09/14/20.    I called Eliquis and was told they have not processed it yet, need Dr. Marcos Eke to reflect twice daily not once daily.  I have notified [name], CPP  Follow-Up:  Patient Assistance Coordination and Pharmacist Review  [name], [place], [place] Pharmacist Assistant [phone]

## 2020-09-14 ENCOUNTER — Other Ambulatory Visit: Payer: Medicare Other

## 2020-09-14 ENCOUNTER — Other Ambulatory Visit: Payer: Self-pay | Admitting: Family Medicine

## 2020-09-14 ENCOUNTER — Other Ambulatory Visit: Payer: Self-pay

## 2020-09-14 DIAGNOSIS — E875 Hyperkalemia: Secondary | ICD-10-CM | POA: Diagnosis not present

## 2020-09-14 DIAGNOSIS — D508 Other iron deficiency anemias: Secondary | ICD-10-CM | POA: Diagnosis not present

## 2020-09-15 LAB — COMPREHENSIVE METABOLIC PANEL
ALT: 42 IU/L — ABNORMAL HIGH (ref 0–32)
AST: 24 IU/L (ref 0–40)
Albumin/Globulin Ratio: 1.7 (ref 1.2–2.2)
Albumin: 3.8 g/dL (ref 3.7–4.7)
Alkaline Phosphatase: 115 IU/L (ref 44–121)
BUN/Creatinine Ratio: 18 (ref 12–28)
BUN: 17 mg/dL (ref 8–27)
Bilirubin Total: 0.4 mg/dL (ref 0.0–1.2)
CO2: 21 mmol/L (ref 20–29)
Calcium: 9 mg/dL (ref 8.7–10.3)
Chloride: 107 mmol/L — ABNORMAL HIGH (ref 96–106)
Creatinine, Ser: 0.94 mg/dL (ref 0.57–1.00)
Globulin, Total: 2.2 g/dL (ref 1.5–4.5)
Glucose: 176 mg/dL — ABNORMAL HIGH (ref 65–99)
Potassium: 5.2 mmol/L (ref 3.5–5.2)
Sodium: 142 mmol/L (ref 134–144)
Total Protein: 6 g/dL (ref 6.0–8.5)
eGFR: 65 mL/min/{1.73_m2} (ref >59)

## 2020-09-15 LAB — IRON,TIBC AND FERRITIN PANEL
Ferritin: 19 ng/mL (ref 15–150)
Iron Saturation: 11 % — ABNORMAL LOW (ref 15–55)
Iron: 43 ug/dL (ref 27–139)
Total Iron Binding Capacity: 393 ug/dL (ref 250–450)
UIBC: 350 ug/dL (ref 118–369)

## 2020-09-15 LAB — CBC WITH DIFFERENTIAL/PLATELET
Basophils Absolute: 0.1 10*3/uL (ref 0.0–0.2)
Basos: 1 %
EOS (ABSOLUTE): 0.3 10*3/uL (ref 0.0–0.4)
Eos: 4 %
Hematocrit: 32.5 % — ABNORMAL LOW (ref 34.0–46.6)
Hemoglobin: 9.9 g/dL — ABNORMAL LOW (ref 11.1–15.9)
Immature Grans (Abs): 0 10*3/uL (ref 0.0–0.1)
Immature Granulocytes: 0 %
Lymphocytes Absolute: 2.6 10*3/uL (ref 0.7–3.1)
Lymphs: 29 %
MCH: 25.9 pg — ABNORMAL LOW (ref 26.6–33.0)
MCHC: 30.5 g/dL — ABNORMAL LOW (ref 31.5–35.7)
MCV: 85 fL (ref 79–97)
Monocytes Absolute: 0.8 10*3/uL (ref 0.1–0.9)
Monocytes: 9 %
Neutrophils Absolute: 5 10*3/uL (ref 1.4–7.0)
Neutrophils: 57 %
Platelets: 427 10*3/uL (ref 150–450)
RBC: 3.82 x10E6/uL (ref 3.77–5.28)
RDW: 14.6 % (ref 11.7–15.4)
WBC: 8.8 10*3/uL (ref 3.4–10.8)

## 2020-09-20 ENCOUNTER — Ambulatory Visit (INDEPENDENT_AMBULATORY_CARE_PROVIDER_SITE_OTHER): Payer: Medicare Other | Admitting: Pulmonary Disease

## 2020-09-20 ENCOUNTER — Other Ambulatory Visit: Payer: Self-pay

## 2020-09-20 ENCOUNTER — Encounter: Payer: Self-pay | Admitting: Pulmonary Disease

## 2020-09-20 VITALS — BP 136/82 | HR 54 | Temp 97.6°F | Ht 68.0 in | Wt 192.8 lb

## 2020-09-20 DIAGNOSIS — R918 Other nonspecific abnormal finding of lung field: Secondary | ICD-10-CM | POA: Diagnosis not present

## 2020-09-20 NOTE — Progress Notes (Addendum)
April Munoz    [id number]    1951/01/16  Primary Care Physician:Cox, Elnita Maxwell, MD  Referring Physician: [name], MD [address],  [place] [address]  Chief complaint: Evaluation for abnormal CT  HPI: 70 year old with history of hypertension, atrial fibrillation, diabetes, stroke, allergic rhinitis Had a CT abdomen done earlier this year which showed mild tree-in-bud in the left lower lobe.  Follow-up CT this month showed persistent abnormalities then she has been referred for further evaluation  She has history of chronic sinusitis, allergic rhinitis, gastric bypass with GERD and Barrett's esophagus Follows with Dr. Sherron Monday GI at [facility] and is on a PPI  Has occasional cough, nonproductive in nature, chest congestion.  Denies any fevers, chills though on occasion she feels some low-grade temperature  Pets: Has a dog Occupation: Used to work for [employer] Exposures: No known exposures.  No mold, hot tub, Jacuzzi Smoking history: Never smoker Travel history: No significant travel history Relevant family history: Brother has lung cancer.  He was a smoker.  Interval history: She had a CT scan done at [facility] and is here for review States that breathing is doing well  Outpatient Encounter Medications as of 09/20/2020  Medication Sig  . acyclovir (ZOVIRAX) 800 MG tablet TAKE ONE (1) TABLET FOUR (4) TIMES DAILYAS NEEDED FOR SHINGLES  . atorvastatin (LIPITOR) 10 MG tablet Take 1 tablet (10 mg total) by mouth daily.  . B Complex-C-Folic Acid TABS Take 1 tablet by mouth daily.  . Biotin 1000 MCG tablet Take 1,000 mcg by mouth daily.  . Calcium-Vitamin D-Vitamin K (VIACTIV CALCIUM PLUS D) 650-12.5-40 MG-MCG-MCG CHEW Chew 1 capsule by mouth daily.  . cyanocobalamin (,VITAMIN B-12,) 1000 MCG/ML injection Inject 1 mL (1,000 mcg total) into the muscle every 30 (thirty) days.  . Dulaglutide (TRULICITY) 7.09 GG/8.3MO SOPN Inject 0.75 mg into the skin  once a week.  Marland Kitchen ELIQUIS 5 MG TABS tablet TAKE ONE TABLET TWICE DAILY  . EPINEPHrine 0.3 mg/0.3 mL IJ SOAJ injection Inject 0.3 mg into the muscle as needed.  Marland Kitchen levothyroxine (SYNTHROID) 88 MCG tablet Take 1 tablet (88 mcg total) by mouth daily before breakfast.  . losartan (COZAAR) 50 MG tablet TAKE ONE (1) TABLET BY MOUTH ONCE DAILY  . Melatonin 10 MG TABS Take 1 tablet by mouth at bedtime.  . metFORMIN (GLUCOPHAGE) 1000 MG tablet TAKE ONE TABLET TWICE DAILY WITH MEALS  . metoprolol succinate (TOPROL-XL) 25 MG 24 hr tablet TAKE 1 TABLET BY MOUTH IN THE MORNING AND 1 TABLET AT BEDTIME.  . Multiple Vitamin (MULTIVITAMIN WITH MINERALS) TABS tablet Take 1 tablet by mouth daily.  Marland Kitchen omeprazole (PRILOSEC) 20 MG capsule TAKE ONE (1) CAPSULE EACH DAY (Patient taking differently: Take 20 mg by mouth every evening.)  . Probiotic Product (PROBIOTIC & ACIDOPHILUS EX ST PO) Take 1 tablet by mouth daily.   Marland Kitchen torsemide (DEMADEX) 20 MG tablet Take 1 tablet (20 mg total) by mouth 3 (three) times a week. Please take this on Monday, Wednesday, and Friday.  . tretinoin (RETIN-A) 0.05 % cream Apply 1 application topically 3 times/day as needed-between meals & bedtime.  Marland Kitchen venlafaxine XR (EFFEXOR-XR) 150 MG 24 hr capsule Take 1 capsule (150 mg total) by mouth daily with breakfast.  . Vitamin D, Ergocalciferol, (DRISDOL) 1.25 MG (50000 UNIT) CAPS capsule Take 1 capsule (50,000 Units total) by mouth every Monday.  . Zinc Sulfate (ZINC 15 PO) Take 1 tablet by  mouth daily.  Marland Kitchen zolpidem (AMBIEN) 10 MG tablet Take 1 tablet (10 mg total) by mouth at bedtime as needed for sleep.   No facility-administered encounter medications on file as of 09/20/2020.   Physical Exam: Blood pressure 136/82, pulse (!) 54, temperature 97.6 F (36.4 C), temperature source Core, height 5\' 8"  (1.727 m), weight 192 lb 12.8 oz (87.5 kg), SpO2 95 %. Gen:      No acute distress HEENT:  EOMI, sclera anicteric Neck:     No masses; no  thyromegaly Lungs:    Clear to auscultation bilaterally; normal respiratory effort CV:         Regular rate and rhythm; no murmurs Abd:      + bowel sounds; soft, non-tender; no palpable masses, no distension Ext:    No edema; adequate peripheral perfusion Skin:      Warm and dry; no rash Neuro: alert and oriented x 3 Psych: normal mood and affect  Data Reviewed: Imaging: CT abdomen pelvis [name] 04/12/2020 -tree-in-bud opacity in the left lower lobe CT chest [place] 04/29/2020-persistent tree-in-bud opacity in the left lower lobe, 4 mm nodule in the left apex. CT chest 08/10/2020-08/10/2020 Mild septal thickening of the lung base, mild air trapping, aortic atherosclerosis I have reviewed the images personally.  PFTs:   Labs: CBC 09/18/2019-WBC 7.1, eos 3%, absolute eosinophil count 213  Assessment:  Abnormal CT Initial scan showed minimal tree-in-bud opacities in 2021.  Follow-up scan in June 2022 shows improvement in opacities with minimal nonspecific changes.  There is no clear evidence of interstitial lung disease  Suspect that she has chronic aspiration secondary to GERD in the setting of Barrett's esophagitis that may be causing the minimal changes at the base.  Advised her to follow-up with her GI doctor regarding this and continue PPI.  She [facility] PFT[facility].  I will get in touch with her primary care to see if I can get copies  Plan/Recommendations: Obtain PFT results Follow-up in 1 year.  Marshell Garfinkel MD [facility] 09/20/2020, 11:01 AM  CC: CoxElnita Maxwell, MD  Addendum: Reviewed PFTs from [facility] dated 08/10/2020 FVC 2.29 [6%], FEV1 1.89 [72%], F/F 83, TLC 3.59 (3%), ERV 0.42 [9%], DLCO 15.27 [51%], DLCO/VA 78%  Moderate restriction, diffusion defect that corrects for alveolar volume.  Suspect this is due to body habitus.  There is no evidence of interstitial lung disease on CT scan  Marshell Garfinkel MD [facility] 09/30/2020, 10:23 AM

## 2020-09-20 NOTE — Patient Instructions (Signed)
I am glad you are doing well with regard to your breathing I have reviewed your CT scan which shows very minimal nonspecific changes.  I do not believe this is of great significance  We will get in touch with your primary care Dr. Tobie Poet and [name] to get results of PFTs Follow-up in 1 year.

## 2020-09-21 ENCOUNTER — Telehealth: Payer: Self-pay

## 2020-09-21 NOTE — Progress Notes (Signed)
Chronic Care Management Pharmacy Assistant   Name: Marguriete Wootan  MRN: [id number] DOB: 12-Aug-1950   Reason for Encounter: Disease State for diabetes and follow up on PAP status   Recent office visits:  09/03/20-PCP was given Prednisone dose pack 08/05/20-Cardiology 08/18/20-Pulmonology CT results  Recent consult visits:  None  Hospital visits:  None in previous 6 months  Medications: Outpatient Encounter Medications as of 09/21/2020  Medication Sig  . acyclovir (ZOVIRAX) 800 MG tablet TAKE ONE (1) TABLET FOUR (4) TIMES DAILYAS NEEDED FOR SHINGLES  . atorvastatin (LIPITOR) 10 MG tablet Take 1 tablet (10 mg total) by mouth daily.  . B Complex-C-Folic Acid TABS Take 1 tablet by mouth daily.  . Biotin 1000 MCG tablet Take 1,000 mcg by mouth daily.  . Calcium-Vitamin D-Vitamin K (VIACTIV CALCIUM PLUS D) 650-12.5-40 MG-MCG-MCG CHEW Chew 1 capsule by mouth daily.  . cyanocobalamin (,VITAMIN B-12,) 1000 MCG/ML injection Inject 1 mL (1,000 mcg total) into the muscle every 30 (thirty) days.  . Dulaglutide (TRULICITY) 7.82 NF/6.2ZH SOPN Inject 0.75 mg into the skin once a week.  Marland Kitchen ELIQUIS 5 MG TABS tablet TAKE ONE TABLET TWICE DAILY  . EPINEPHrine 0.3 mg/0.3 mL IJ SOAJ injection Inject 0.3 mg into the muscle as needed.  Marland Kitchen levothyroxine (SYNTHROID) 88 MCG tablet Take 1 tablet (88 mcg total) by mouth daily before breakfast.  . losartan (COZAAR) 50 MG tablet TAKE ONE (1) TABLET BY MOUTH ONCE DAILY  . Melatonin 10 MG TABS Take 1 tablet by mouth at bedtime.  . metFORMIN (GLUCOPHAGE) 1000 MG tablet TAKE ONE TABLET TWICE DAILY WITH MEALS  . metoprolol succinate (TOPROL-XL) 25 MG 24 hr tablet TAKE 1 TABLET BY MOUTH IN THE MORNING AND 1 TABLET AT BEDTIME.  . Multiple Vitamin (MULTIVITAMIN WITH MINERALS) TABS tablet Take 1 tablet by mouth daily.  Marland Kitchen omeprazole (PRILOSEC) 20 MG capsule TAKE ONE (1) CAPSULE EACH DAY (Patient taking differently: Take 20 mg by mouth every evening.)  . Probiotic Product  (PROBIOTIC & ACIDOPHILUS EX ST PO) Take 1 tablet by mouth daily.   Marland Kitchen torsemide (DEMADEX) 20 MG tablet Take 1 tablet (20 mg total) by mouth 3 (three) times a week. Please take this on Monday, Wednesday, and Friday.  . tretinoin (RETIN-A) 0.05 % cream Apply 1 application topically 3 times/day as needed-between meals & bedtime.  Marland Kitchen venlafaxine XR (EFFEXOR-XR) 150 MG 24 hr capsule Take 1 capsule (150 mg total) by mouth daily with breakfast.  . Vitamin D, Ergocalciferol, (DRISDOL) 1.25 MG (50000 UNIT) CAPS capsule Take 1 capsule (50,000 Units total) by mouth every Monday.  . Zinc Sulfate (ZINC 15 PO) Take 1 tablet by mouth daily.  Marland Kitchen zolpidem (AMBIEN) 10 MG tablet Take 1 tablet (10 mg total) by mouth at bedtime as needed for sleep.   No facility-administered encounter medications on file as of 09/21/2020.    Recent Relevant Labs: Lab Results  Component Value Date/Time   HGBA1C 7.1 (H) 09/03/2020 09:53 AM   HGBA1C 7.2 (H) 01/26/2020 09:54 AM   MICROALBUR 150 05/27/2020 09:39 AM   MICROALBUR 150 09/18/2019 01:25 PM    Kidney Function Lab Results  Component Value Date/Time   CREATININE 0.94 09/14/2020 10:31 AM   CREATININE 1.06 (H) 09/03/2020 09:53 AM   GFRNONAA 53 (L) 09/03/2020 09:53 AM   GFRAA 61 09/03/2020 09:53 AM    . Current antihyperglycemic regimen:   metformin 1000 mg bid   . What recent interventions/DTPs have been made to improve glycemic control:  Patient  is taking her medication as directed.  . Have there been any recent hospitalizations or ED visits since last visit with CPP? No   . Patient reports hypoglycemic symptoms, stated she just feels bad.  . Patient reports hyperglycemic symptoms, including feeling like she is extremely hyper  . How often are you checking your blood sugar? once daily . What are your blood sugars ranging?  Stated it has been running higher that normal, due to having all her dental work and implants. She stated she is ranging around  135.  . During the week, how often does your blood glucose drop below 70? Never   . Are you checking your feet daily/regularly?  She is checking her feet.   [place] for [employer] and she is approved thru 12/22, her medication was shipped out on 09/16/20.  She did receive her medication today (09/22/20)  Called to check on Eliquis, she was denied on 09/14/20 due to her out of pocket.  She needs to pay $805.46 more to qualify, then all we need to send is the out of pocket confirmation with her case number of [id number].   Adherence Review: Is the patient currently on a STATIN medication? Yes Is the patient currently on ACE/ARB medication? Yes Does the patient have >5 day gap between last estimated fill dates? No   [name], [place] Pharmacist Assistant [phone]

## 2020-09-23 ENCOUNTER — Other Ambulatory Visit: Payer: Self-pay

## 2020-09-23 ENCOUNTER — Encounter: Payer: Self-pay | Admitting: Pulmonary Disease

## 2020-09-23 DIAGNOSIS — E1121 Type 2 diabetes mellitus with diabetic nephropathy: Secondary | ICD-10-CM

## 2020-09-23 MED ORDER — FREESTYLE LIBRE 2 SENSOR MISC: 3 refills | Status: DC

## 2020-09-23 MED ORDER — FREESTYLE LIBRE 2 READER DEVI: 0 refills | Status: DC

## 2020-09-23 MED ORDER — FREESTYLE LIBRE 2 READER DEVI
0 refills | Status: DC
Start: 2020-09-23 — End: 2020-09-23

## 2020-09-27 DIAGNOSIS — E559 Vitamin D deficiency, unspecified: Secondary | ICD-10-CM | POA: Diagnosis not present

## 2020-09-27 DIAGNOSIS — N182 Chronic kidney disease, stage 2 (mild): Secondary | ICD-10-CM | POA: Diagnosis not present

## 2020-09-27 DIAGNOSIS — R809 Proteinuria, unspecified: Secondary | ICD-10-CM | POA: Diagnosis not present

## 2020-09-27 DIAGNOSIS — I129 Hypertensive chronic kidney disease with stage 1 through stage 4 chronic kidney disease, or unspecified chronic kidney disease: Secondary | ICD-10-CM | POA: Diagnosis not present

## 2020-09-27 DIAGNOSIS — D509 Iron deficiency anemia, unspecified: Secondary | ICD-10-CM | POA: Diagnosis not present

## 2020-09-27 DIAGNOSIS — E1122 Type 2 diabetes mellitus with diabetic chronic kidney disease: Secondary | ICD-10-CM | POA: Diagnosis not present

## 2020-09-29 NOTE — Progress Notes (Signed)
Subjective:  Patient ID: April Munoz, female    DOB: Apr 26, 1951  Age: 70 y.o. MRN: [id number]  Chief Complaint  Patient presents with  . Depression    HPI Depression- discontinued effexor and started on trintellix Patient has fallen 2x in the last 3 weeks. She missed her bed somehow and fell hitting her left temple. She had LOC and does not remember much until her husband awoke her.   Diabetes: Started on trulicity 3.84 mg weekly. Started one week ago. She eats fairly healthy. Sporadically checks sugars.   CKD stage 2: I recommended start farxiga with last labs. She did not get this message she says.   Current Outpatient Medications on File Prior to Visit  Medication Sig Dispense Refill  . atorvastatin (LIPITOR) 10 MG tablet Take 1 tablet (10 mg total) by mouth daily. 90 tablet 0  . B Complex-C-Folic Acid TABS Take 1 tablet by mouth daily.    . Biotin 1000 MCG tablet Take 1,000 mcg by mouth daily.    . Calcium-Vitamin D-Vitamin K (VIACTIV CALCIUM PLUS D) 650-12.5-40 MG-MCG-MCG CHEW Chew 1 capsule by mouth daily.    . Continuous Blood Gluc Receiver (FREESTYLE LIBRE 2 READER) DEVI E11.21 Check blood sugar 4 times daily as directed 1 each 0  . Continuous Blood Gluc Sensor (FREESTYLE LIBRE 2 SENSOR) MISC E11.21 Change sensor every 14 days as directed 6 each 3  . cyanocobalamin (,VITAMIN B-12,) 1000 MCG/ML injection Inject 1 mL (1,000 mcg total) into the muscle every 30 (thirty) days. 1 mL 2  . Dulaglutide (TRULICITY) 6.65 LD/3.5TS SOPN Inject 0.75 mg into the skin once a week. 6 mL 0  . ELIQUIS 5 MG TABS tablet TAKE ONE TABLET TWICE DAILY 180 tablet 1  . EPINEPHrine 0.3 mg/0.3 mL IJ SOAJ injection Inject 0.3 mg into the muscle as needed. 1 each 1  . levothyroxine (SYNTHROID) 88 MCG tablet Take 1 tablet (88 mcg total) by mouth daily before breakfast. 90 tablet 0  . losartan (COZAAR) 50 MG tablet TAKE ONE (1) TABLET BY MOUTH ONCE DAILY (Patient taking differently: Take 50 mg by mouth 2 (two)  times daily.) 90 tablet 1  . Melatonin 10 MG TABS Take 1 tablet by mouth at bedtime.    . metoprolol succinate (TOPROL-XL) 25 MG 24 hr tablet TAKE 1 TABLET BY MOUTH IN THE MORNING AND 1 TABLET AT BEDTIME. 60 tablet 11  . Multiple Vitamin (MULTIVITAMIN WITH MINERALS) TABS tablet Take 1 tablet by mouth daily.    Marland Kitchen omeprazole (PRILOSEC) 20 MG capsule TAKE ONE (1) CAPSULE EACH DAY (Patient taking differently: Take 20 mg by mouth every evening.) 90 capsule 0  . Probiotic Product (PROBIOTIC & ACIDOPHILUS EX ST PO) Take 1 tablet by mouth daily.     Marland Kitchen torsemide (DEMADEX) 20 MG tablet Take 1 tablet (20 mg total) by mouth 3 (three) times a week. Please take this on Monday, Wednesday, and Friday. 90 tablet 0  . tretinoin (RETIN-A) 0.05 % cream Apply 1 application topically 3 times/day as needed-between meals & bedtime.    Marland Kitchen venlafaxine XR (EFFEXOR-XR) 150 MG 24 hr capsule Take 1 capsule (150 mg total) by mouth daily with breakfast. 90 capsule 0  . Vitamin D, Ergocalciferol, (DRISDOL) 1.25 MG (50000 UNIT) CAPS capsule Take 1 capsule (50,000 Units total) by mouth every Monday. 12 capsule 1  . Zinc Sulfate (ZINC 15 PO) Take 1 tablet by mouth daily.     No current facility-administered medications on file prior to visit.  Past Medical History:  Diagnosis Date  . Anxiety   . Arthritis   . Ataxic gait   . Atrial fibrillation ([place])   . Atrophy of thyroid   . B12 deficiency 10/31/2017  . Cellulitis of abdominal wall 07/07/2016  . Chronic anticoagulation 05/31/2018  . Depression   . Depression, major, recurrent, mild ([place]) 09/16/2019  . Diabetes mellitus without complication ([place])   . 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 Esophageal stricture 06/05/2017  . Essential hypertension 02/24/2013  . Fatigue 01/20/2020  . Fibromyalgia 02/24/2013  . Full dentures   . Hereditary and idiopathic peripheral neuropathy 02/24/2013  . High risk medication use 11/07/2017  . Hyperlipidemia 02/24/2013  . Hypertension   .  Hypertensive heart disease 02/24/2013  . Incontinence   . Insomnia   . Longstanding persistent atrial fibrillation ([place]) 09/16/2019  . Mild vitamin D deficiency 09/16/2019  . Mixed hyperlipidemia 02/24/2013  . Murmur, cardiac 08/15/2015  . Neuropathy in diabetes ([place]) 10/31/2017  . Osteoporosis   . Other amnesia   . Other amnesia   . 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   . Paroxysmal atrial fibrillation ([place]) 08/15/2015   CHADS2vasc=3 CHADS2vasc=3  . Primary insomnia   . Restless legs syndrome 03/14/2013  . Secondary hypothyroidism 09/16/2019  . Thoracic or lumbosacral neuritis or radiculitis 02/24/2013  . Type 2 diabetes mellitus, without long-term current use of insulin ([place]) 02/24/2013  . Urge incontinence of urine 09/16/2019  . Wears glasses    Past Surgical History:  Procedure Laterality Date  . A-FLUTTER ABLATION N/A 07/30/2019   Procedure: A-FLUTTER ABLATION;  Surgeon: Constance Haw, MD;  Location: [facility];  Service: Cardiovascular;  Laterality: N/A;  . ATRIAL FIBRILLATION ABLATION N/A 03/21/2019   Procedure: ATRIAL FIBRILLATION ABLATION;  Surgeon: Constance Haw, MD;  Location: [facility];  Service: Cardiovascular;  Laterality: N/A;  . BREAST REDUCTION SURGERY  1983  . CATARACT EXTRACTION, BILATERAL  2018, 2019  . CHOLECYSTECTOMY  1971   open  . DIAGNOSTIC LAPAROSCOPY  2010   lysis of adhesions  . DILATION AND CURETTAGE OF UTERUS  2004  . KNEE ARTHROSCOPY  1998   left  . KNEE ARTHROSCOPY W/ LATERAL RETINACULAR REPAIR    . MASS EXCISION Left 11/11/2013   Procedure: EXCISION MUCOID CYST LEFT INDEX FINGER/DEBRIDEMENT LEFT INDEX FINGER;  Surgeon: Wynonia Sours, MD;  Location: [facility];  Service: Orthopedics;  Laterality: Left;  ANESTHESIA: IV REGIONAL/FAB  . SHOULDER ARTHROSCOPY W/ ROTATOR CUFF REPAIR  2007   left  . TRIGGER FINGER RELEASE Left 11/11/2013   Procedure: RELEASE A-1 PULLEY LEFT RING FINGER;  Surgeon:  Wynonia Sours, MD;  Location: [facility];  Service: Orthopedics;  Laterality: Left;  . UMBILICAL HERNIA REPAIR  2008, 2010    Family History  Problem Relation Age of Onset  . Heart failure Father   . Heart disease Brother   . Heart attack Brother   . Heart failure Paternal Grandfather   . Stroke Paternal Grandfather   . Atrial fibrillation Mother   . Heart attack Mother   . Diabetes Maternal Grandmother    Social History   Socioeconomic History  . Marital status: Married    Spouse name: Not on file  . Number of children: Not on file  . Years of education: Not on file  . Highest education level: Not on file  Occupational History  . Occupation: retired  Tobacco Use  . Smoking status:  Never Smoker  . Smokeless tobacco: Never Used  Vaping Use  . Vaping Use: Never used  Substance and Sexual Activity  . Alcohol use: Yes    Alcohol/week: 1.0 - 2.0 standard drink    Types: 1 - 2 Glasses of wine per week    Comment: 4 times a week  . Drug use: No  . Sexual activity: Not on file  Other Topics Concern  . Not on file  Social History Narrative  . Not on file   Social Determinants of Health   Financial Resource Strain: Not on file  Food Insecurity: No Food Insecurity  . Worried About Charity fundraiser in the Last Year: Never true  . Ran Out of Food in the Last Year: Never true  Transportation Needs: No Transportation Needs  . Lack of Transportation (Medical): No  . Lack of Transportation (Non-Medical): No  Physical Activity: Not on file  Stress: Not on file  Social Connections: Not on file    Review of Systems  Constitutional: Negative for chills, fatigue and fever.  HENT: Positive for congestion and dental problem. Negative for ear pain, rhinorrhea and sore throat.   Respiratory: Positive for cough and shortness of breath.   Cardiovascular: Negative for chest pain and palpitations.  Gastrointestinal: Positive for vomiting. Negative for abdominal pain,  constipation, diarrhea and nausea.  Genitourinary: Positive for urgency. Negative for dysuria.  Musculoskeletal: Negative for back pain and myalgias.  Skin: Positive for rash (hands and arms).  Neurological: Positive for syncope, light-headedness and headaches. Negative for dizziness and weakness.  Psychiatric/Behavioral: Negative for dysphoric mood. The patient is not nervous/anxious.      Objective:  There were no vitals taken for this visit.  BP/Weight 09/20/2020 09/03/2020 7/62/8315  Systolic BP 176 160 737  Diastolic BP 82 74 80  Wt. (Lbs) 192.8 195 196.6  BMI 29.32 29.65 29.89    Physical Exam Vitals reviewed.  Constitutional:      Appearance: Normal appearance. She is normal weight.  Neck:     Vascular: No carotid bruit.  Cardiovascular:     Rate and Rhythm: Normal rate and regular rhythm.     Pulses: Normal pulses.     Heart sounds: Normal heart sounds.  Pulmonary:     Effort: Pulmonary effort is normal. No respiratory distress.     Breath sounds: Normal breath sounds.  Abdominal:     General: Abdomen is flat. Bowel sounds are normal.     Palpations: Abdomen is soft.     Tenderness: There is no abdominal tenderness.  Skin:    Findings: Bruising present.     Comments: Skin around left eye is normal at the point of impact.  Neurological:     Mental Status: She is alert and oriented to person, place, and time.     Cranial Nerves: No cranial nerve deficit.     Comments: Cranial nerves II-XII are intact.  Psychiatric:        Behavior: Behavior normal.     Comments: Frustrated mood.     Diabetic Foot Exam - Simple   No data filed      Lab Results  Component Value Date   WBC 8.8 09/14/2020   HGB 9.9 (L) 09/14/2020   HCT 32.5 (L) 09/14/2020   PLT 427 09/14/2020   GLUCOSE 176 (H) 09/14/2020   CHOL 156 09/03/2020   TRIG 153 (H) 09/03/2020   HDL 57 09/03/2020   LDLCALC 73 09/03/2020   ALT 42 (H) 09/14/2020  AST 24 09/14/2020   NA 142 09/14/2020   K 5.2  09/14/2020   CL 107 (H) 09/14/2020   CREATININE 0.94 09/14/2020   BUN 17 09/14/2020   CO2 21 09/14/2020   TSH 5.250 (H) 05/31/2020   HGBA1C 7.1 (H) 09/03/2020   MICROALBUR 150 05/27/2020      Assessment & Plan:   1. Diabetic glomerulopathy (HCC) Control: Poor Recommend check sugars fasting daily. Recommend check feet daily. Recommend annual eye exams. Medicines: Add farxiga 5 mg QD and continue with trulicity. Continue to work on eating a healthy diet and exercise.    2. Type 2 DM with CKD stage 2 and hypertension ([place]) - Start Farxiga 5 mg QD.  3. Contusion of scalp, initial encounter - CT of Brain w/o contrast STAT.  4. Loss of consciousness ([place]) - CT of Brain w/o contrast STAT.  5. Acquired thrombophilia ([place]) Secondary to eliquis.  6. Moderate recurrent major depression ([place])  Recommend pt follow up with psychiatry.  Follow-up: Return in about 4 months (around 01/31/2021) for fasting.  An After Visit Summary was printed and given to the patient.  [name], MD [facility] [phone]

## 2020-10-01 ENCOUNTER — Ambulatory Visit (INDEPENDENT_AMBULATORY_CARE_PROVIDER_SITE_OTHER): Payer: Medicare Other | Admitting: Family Medicine

## 2020-10-01 ENCOUNTER — Other Ambulatory Visit: Payer: Self-pay

## 2020-10-01 VITALS — BP 136/82 | HR 56 | Temp 97.4°F | Resp 16 | Ht 68.0 in | Wt 193.0 lb

## 2020-10-01 DIAGNOSIS — R4020 Unspecified coma: Secondary | ICD-10-CM

## 2020-10-01 DIAGNOSIS — D6869 Other thrombophilia: Secondary | ICD-10-CM | POA: Diagnosis not present

## 2020-10-01 DIAGNOSIS — S0003XA Contusion of scalp, initial encounter: Secondary | ICD-10-CM | POA: Diagnosis not present

## 2020-10-01 DIAGNOSIS — E1121 Type 2 diabetes mellitus with diabetic nephropathy: Secondary | ICD-10-CM

## 2020-10-01 DIAGNOSIS — E1122 Type 2 diabetes mellitus with diabetic chronic kidney disease: Secondary | ICD-10-CM

## 2020-10-01 DIAGNOSIS — R519 Headache, unspecified: Secondary | ICD-10-CM | POA: Diagnosis not present

## 2020-10-01 DIAGNOSIS — N182 Chronic kidney disease, stage 2 (mild): Secondary | ICD-10-CM | POA: Diagnosis not present

## 2020-10-01 DIAGNOSIS — I129 Hypertensive chronic kidney disease with stage 1 through stage 4 chronic kidney disease, or unspecified chronic kidney disease: Secondary | ICD-10-CM | POA: Diagnosis not present

## 2020-10-01 DIAGNOSIS — F331 Major depressive disorder, recurrent, moderate: Secondary | ICD-10-CM | POA: Diagnosis not present

## 2020-10-06 ENCOUNTER — Other Ambulatory Visit: Payer: Self-pay | Admitting: Physician Assistant

## 2020-10-06 DIAGNOSIS — D631 Anemia in chronic kidney disease: Secondary | ICD-10-CM | POA: Diagnosis not present

## 2020-10-06 DIAGNOSIS — N189 Chronic kidney disease, unspecified: Secondary | ICD-10-CM | POA: Diagnosis not present

## 2020-10-07 ENCOUNTER — Other Ambulatory Visit: Payer: Self-pay | Admitting: Family Medicine

## 2020-10-07 ENCOUNTER — Telehealth: Payer: Self-pay

## 2020-10-07 DIAGNOSIS — F5101 Primary insomnia: Secondary | ICD-10-CM

## 2020-10-07 NOTE — Progress Notes (Signed)
    Chronic Care Management Pharmacy Assistant   Name: April Munoz  MRN: [id number] DOB: 1950-10-30   Reason for Encounter: Adherence review    Medications: Outpatient Encounter Medications as of 10/07/2020  Medication Sig  . acyclovir (ZOVIRAX) 800 MG tablet TAKE ONE (1) TABLET FOUR (4) TIMES DAILYAS NEEDED FOR SHINGLES  . atorvastatin (LIPITOR) 10 MG tablet Take 1 tablet (10 mg total) by mouth daily.  . B Complex-C-Folic Acid TABS Take 1 tablet by mouth daily.  . Biotin 1000 MCG tablet Take 1,000 mcg by mouth daily.  . Calcium-Vitamin D-Vitamin K (VIACTIV CALCIUM PLUS D) 650-12.5-40 MG-MCG-MCG CHEW Chew 1 capsule by mouth daily.  . Continuous Blood Gluc Receiver (FREESTYLE LIBRE 2 READER) DEVI E11.21 Check blood sugar 4 times daily as directed  . Continuous Blood Gluc Sensor (FREESTYLE LIBRE 2 SENSOR) MISC E11.21 Change sensor every 14 days as directed  . cyanocobalamin (,VITAMIN B-12,) 1000 MCG/ML injection Inject 1 mL (1,000 mcg total) into the muscle every 30 (thirty) days.  . Dulaglutide (TRULICITY) 3.73 SK/8.7GO SOPN Inject 0.75 mg into the skin once a week.  Marland Kitchen ELIQUIS 5 MG TABS tablet TAKE ONE TABLET TWICE DAILY  . EPINEPHrine 0.3 mg/0.3 mL IJ SOAJ injection Inject 0.3 mg into the muscle as needed.  Marland Kitchen levothyroxine (SYNTHROID) 88 MCG tablet Take 1 tablet (88 mcg total) by mouth daily before breakfast.  . losartan (COZAAR) 50 MG tablet TAKE ONE (1) TABLET BY MOUTH ONCE DAILY (Patient taking differently: Take 50 mg by mouth 2 (two) times daily.)  . Melatonin 10 MG TABS Take 1 tablet by mouth at bedtime.  . metoprolol succinate (TOPROL-XL) 25 MG 24 hr tablet TAKE 1 TABLET BY MOUTH IN THE MORNING AND 1 TABLET AT BEDTIME.  . Multiple Vitamin (MULTIVITAMIN WITH MINERALS) TABS tablet Take 1 tablet by mouth daily.  Marland Kitchen omeprazole (PRILOSEC) 20 MG capsule TAKE ONE (1) CAPSULE EACH DAY (Patient taking differently: Take 20 mg by mouth every evening.)  . Probiotic Product (PROBIOTIC &  ACIDOPHILUS EX ST PO) Take 1 tablet by mouth daily.   Marland Kitchen torsemide (DEMADEX) 20 MG tablet Take 1 tablet (20 mg total) by mouth 3 (three) times a week. Please take this on Monday, Wednesday, and Friday.  . tretinoin (RETIN-A) 0.05 % cream Apply 1 application topically 3 times/day as needed-between meals & bedtime.  Marland Kitchen venlafaxine XR (EFFEXOR-XR) 150 MG 24 hr capsule Take 1 capsule (150 mg total) by mouth daily with breakfast.  . Vitamin D, Ergocalciferol, (DRISDOL) 1.25 MG (50000 UNIT) CAPS capsule Take 1 capsule (50,000 Units total) by mouth every Monday.  . Zinc Sulfate (ZINC 15 PO) Take 1 tablet by mouth daily.  Marland Kitchen zolpidem (AMBIEN) 10 MG tablet Take 1 tablet (10 mg total) by mouth at bedtime as needed for sleep.   No facility-administered encounter medications on file as of 10/07/2020.    Care Gaps: Jul 17 2020 OPHTHALMOLOGY EXAM (Yearly) Last completed: Jul 18, 2019    Jul 18 2020 DEXA SCAN (Every 2 Years) Last completed: Jul 18, 2018   Dec 01 2020 COLONOSCOPY (Pts 45-60yrs Insurance coverage will need to be confirmed) (Every 10 Years) Last completed: Dec 02, 2010    Patient has next appointment on 12/02/20 with [name], CPP 12/14/20-PCP for [place], [place] Pharmacist Assistant [phone]

## 2020-10-10 ENCOUNTER — Encounter: Payer: Self-pay | Admitting: Family Medicine

## 2020-10-11 ENCOUNTER — Telehealth: Payer: Self-pay

## 2020-10-11 ENCOUNTER — Other Ambulatory Visit: Payer: Self-pay | Admitting: Physician Assistant

## 2020-10-11 NOTE — Telephone Encounter (Cosign Needed)
  Chronic Care Management   Note  10/11/2020 Name: April Munoz MRN: [id number] DOB: 1950/10/25  Patient called to report that she took her second dose of Trulicity yesterday. She did not feel well and vomited. She states that she would like to stop taking Trulicity at this time. She had stopped metformin when starting Trulicity because she felt she didn't need both. She is resuming metformin twice daily and continuing with [place].   Pharmacist will coordinate application for Avel Sensor, PharmD, [facility] Clinical Pharmacist [facility] [phone] (office) [phone] (mobile)

## 2020-10-11 NOTE — Telephone Encounter (Signed)
Agree with plan. Kc

## 2020-10-13 DIAGNOSIS — D631 Anemia in chronic kidney disease: Secondary | ICD-10-CM | POA: Diagnosis not present

## 2020-10-13 DIAGNOSIS — N189 Chronic kidney disease, unspecified: Secondary | ICD-10-CM | POA: Diagnosis not present

## 2020-10-14 DIAGNOSIS — E119 Type 2 diabetes mellitus without complications: Secondary | ICD-10-CM | POA: Diagnosis not present

## 2020-10-14 DIAGNOSIS — H34232 Retinal artery branch occlusion, left eye: Secondary | ICD-10-CM | POA: Diagnosis not present

## 2020-10-14 LAB — HM DIABETES EYE EXAM

## 2020-10-18 IMAGING — CT CT HEART MORPH/PULM VEIN WITH CONTRAST AND WITHOUT CORONARY CALC
2 of 6 series · 10 of 20 positions shown, 12 images · IV contrast (APPLIED)
Comparison: None.
COMPARISON: None.

Addendum:
EXAM:
OVER-READ INTERPRETATION CT CHEST

The following report is an over-read performed by radiologist Dr.
over-read does not include interpretation of cardiac or coronary
anatomy or pathology. The coronary calcium score/coronary CTA
interpretation by the cardiologist is attached.
CLINICAL DATA: 68-year-old female with atrial fibrillation
scheduled for an ablation.
Cardiac CT/CTA
TECHNIQUE: The patient was scanned on a Siemens Somatom scanner.

[Series 6: best diast · axial · 0.38mm/px · z∈[+1221,+1298]mm · 5 of 291 slices shown, 7 images]
[im 49/291  vessel]
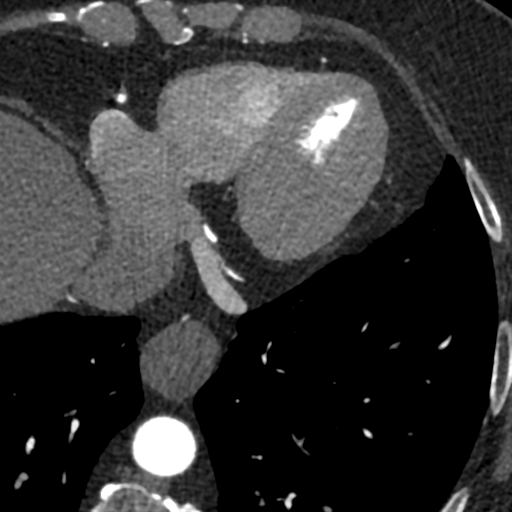
[im 49/291  lung]
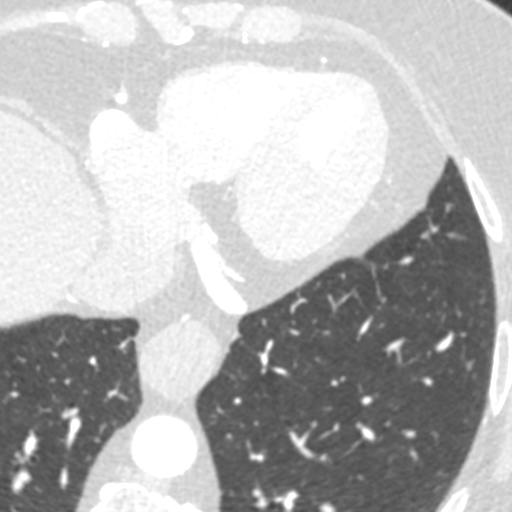
[im 97/291  vessel]
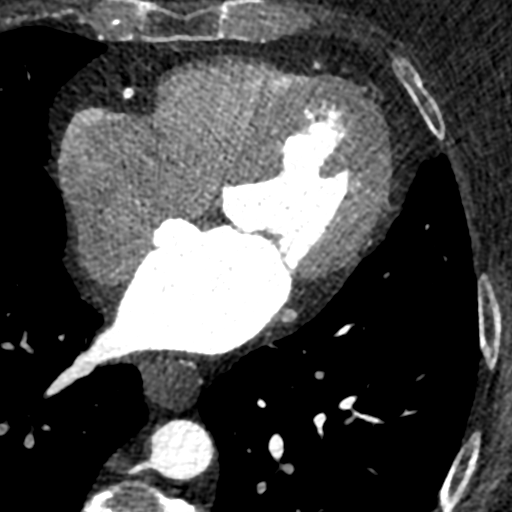
[im 146/291  vessel]
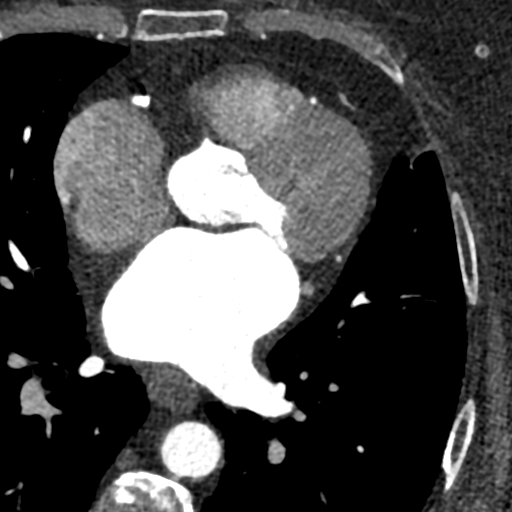
[im 194/291  vessel]
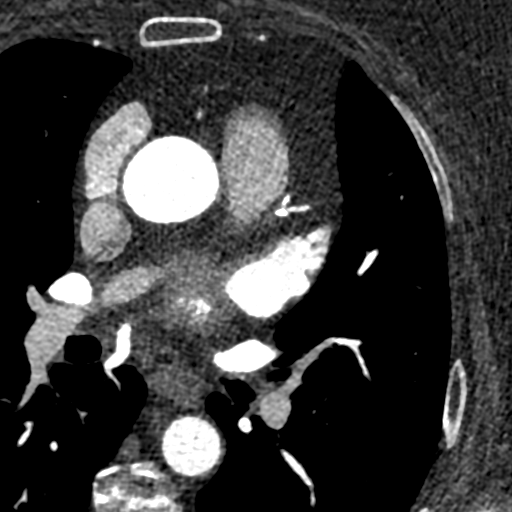
[im 242/291  vessel]
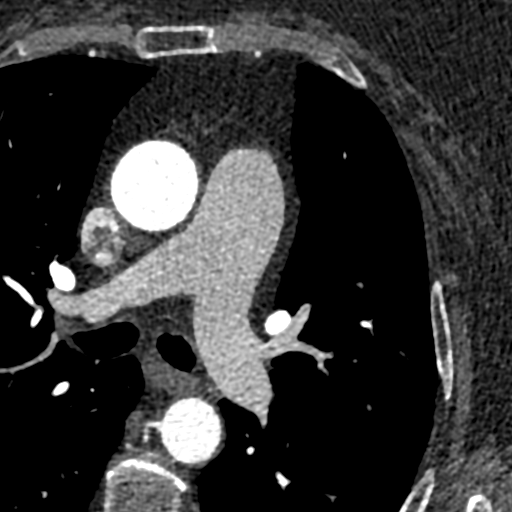
[im 242/291  lung]
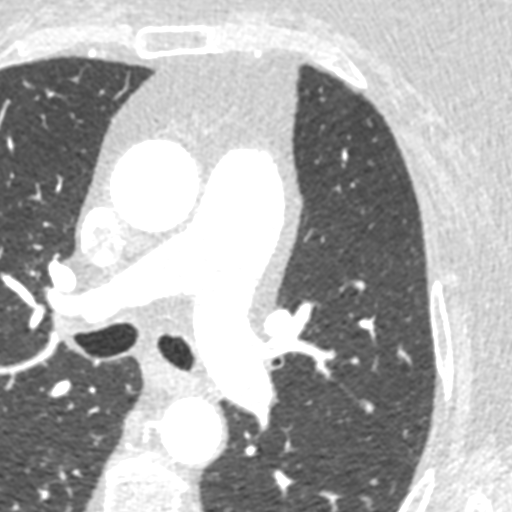

[Series 7: +300 ms · axial · 0.38mm/px · z∈[+1221,+1298]mm · 5 of 291 slices shown]
[im 49/291  vessel]
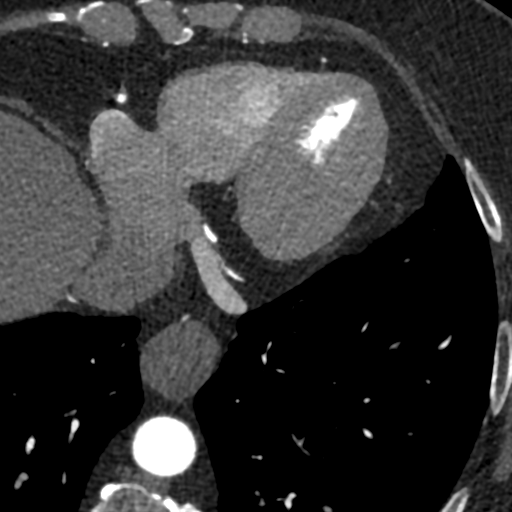
[im 97/291  vessel]
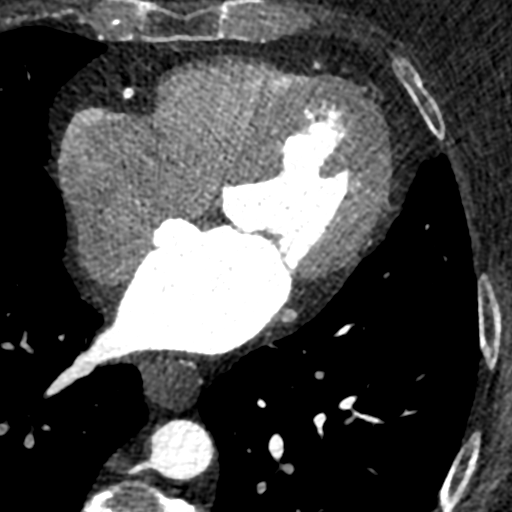
[im 146/291  vessel]
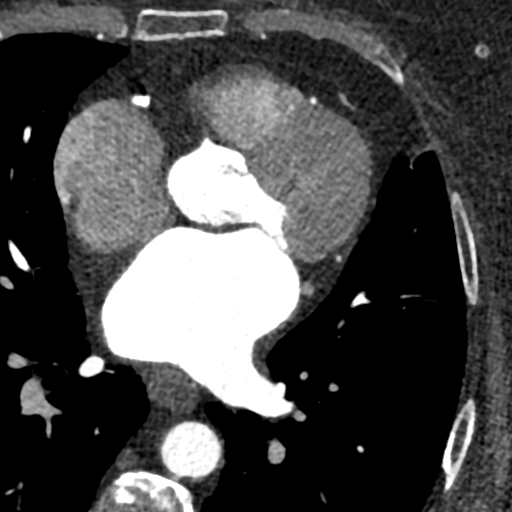
[im 194/291  vessel]
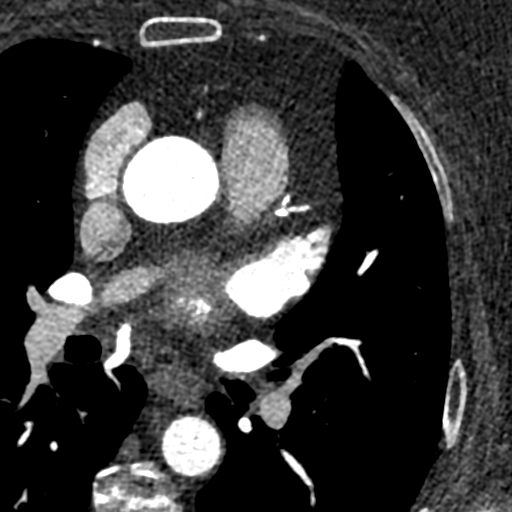
[im 242/291  vessel]
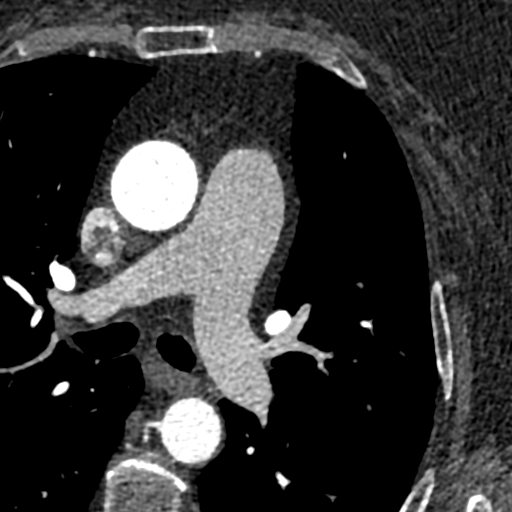

[10 of 20 positions shown; findings below may reference images not displayed]

FINDINGS: Vascular: No visible atherosclerosis and no evidence of aneurysm
involving the visualized aorta.

Mediastinum/Nodes: Small hiatal hernia. No pathologic
lymphadenopathy within the visualized mediastinum or hila.

Lungs/Pleura: Minimal linear scarring in the lingula, unchanged.
Visualized lung parenchyma otherwise clear. No pleural effusions.
Central airways patent without significant bronchial wall
thickening.

Upper Abdomen: Unremarkable for the early arterial phase of
enhancement.

Musculoskeletal: Degenerative changes involving the visualized LOWER
thoracic spine.
IMPRESSION: 1. Small hiatal hernia.
2. No significant extracardiac findings otherwise.
FINDINGS: A 120 kV prospective scan was triggered in the descending thoracic
aorta at 111 HU's. Gantry rotation speed was 280 msecs and
collimation was .9 mm. No beta blockade and no NTG was given. The 3D
data set was reconstructed in 5% intervals of the 60-80 % of the R-R
cycle. Diastolic phases were analyzed on a dedicated work station
using MPR, MIP and VRT modes. The patient received 80 cc of
contrast.

There is normal pulmonary vein drainage into the left atrium (2 on
the right and 2 on the left) with ostial measurements as follows:

RUPV: 16.5 x 15.8 mm

RLPV: 21.0 x 19.8 mm

LUPV: 14.4 x 11.6 mm

LLPV: 18.4 x 17.5 mm

The left atrial appendage is large with no evidence for a thrombus.

The esophagus runs to the left from the left atrial midline and is
in the proximity to the ostia of the LUPV and LLPVs.

Aorta: Normal caliber. Mild diffuse atherosclerotic plaque and
calcifications and no dissection.

Aortic Valve:  Trileaflet.  No calcifications.

Coronary Arteries: Normal coronary origin. Right dominance. The
study was performed without use of NTG and insufficient for plaque
evaluation. Calcium score is 963 that represents 97 percentile for
age/sex.
IMPRESSION: 1. There is normal pulmonary vein drainage into the left atrium.

2. The left atrial appendage is large with no evidence for a
thrombus.

3. The esophagus runs to the left from the left atrial midline and
is in the proximity to the ostia of the LUPV and LLPVs.

4. Pulmonary artery is moderately dilated measuring 38 mg consistent
with pulmonary hypertension.

5.  Aneurysmal interatrial septum.

*** End of Addendum ***
EXAM:
OVER-READ INTERPRETATION CT CHEST

The following report is an over-read performed by radiologist Dr.
over-read does not include interpretation of cardiac or coronary
anatomy or pathology. The coronary calcium score/coronary CTA
interpretation by the cardiologist is attached.
FINDINGS: Vascular: No visible atherosclerosis and no evidence of aneurysm
involving the visualized aorta.

Mediastinum/Nodes: Small hiatal hernia. No pathologic
lymphadenopathy within the visualized mediastinum or hila.

Lungs/Pleura: Minimal linear scarring in the lingula, unchanged.
Visualized lung parenchyma otherwise clear. No pleural effusions.
Central airways patent without significant bronchial wall
thickening.

Upper Abdomen: Unremarkable for the early arterial phase of
enhancement.

Musculoskeletal: Degenerative changes involving the visualized LOWER
thoracic spine.
IMPRESSION: 1. Small hiatal hernia.
2. No significant extracardiac findings otherwise.

## 2020-10-28 ENCOUNTER — Other Ambulatory Visit: Payer: Self-pay | Admitting: Family Medicine

## 2020-10-28 ENCOUNTER — Telehealth: Payer: Self-pay

## 2020-10-28 DIAGNOSIS — E559 Vitamin D deficiency, unspecified: Secondary | ICD-10-CM

## 2020-10-28 NOTE — Progress Notes (Signed)
Chronic Care Management Pharmacy Assistant   Name: April Munoz  MRN: [id number] DOB: 05/27/1951  Reason for Encounter: Disease State for hypertension  Recent office visits:  10/01/20-Dr. Cox PCP, Depression, Head CT ordered, stopped Metformin 1000mg , discontinued effexor and started on trintellix Started on trulicity 6.16 mg weekly, start [place] with last labs, follow up 4 mos.   Recent consult visits:  None  Hospital visits:  None in previous 6 months  Medications: Outpatient Encounter Medications as of 10/28/2020  Medication Sig  . acyclovir (ZOVIRAX) 800 MG tablet TAKE ONE (1) TABLET FOUR (4) TIMES DAILYAS NEEDED FOR SHINGLES  . atorvastatin (LIPITOR) 10 MG tablet Take 1 tablet (10 mg total) by mouth daily.  . B Complex-C-Folic Acid TABS Take 1 tablet by mouth daily.  . Biotin 1000 MCG tablet Take 1,000 mcg by mouth daily.  . Calcium-Vitamin D-Vitamin K (VIACTIV CALCIUM PLUS D) 650-12.5-40 MG-MCG-MCG CHEW Chew 1 capsule by mouth daily.  . Continuous Blood Gluc Receiver (FREESTYLE LIBRE 2 READER) DEVI E11.21 Check blood sugar 4 times daily as directed  . Continuous Blood Gluc Sensor (FREESTYLE LIBRE 2 SENSOR) MISC E11.21 Change sensor every 14 days as directed  . cyanocobalamin (,VITAMIN B-12,) 1000 MCG/ML injection Inject 1 mL (1,000 mcg total) into the muscle every 30 (thirty) days.  . Dulaglutide (TRULICITY) 0.73 XT/0.6YI SOPN Inject 0.75 mg into the skin once a week.  Marland Kitchen ELIQUIS 5 MG TABS tablet TAKE ONE TABLET TWICE DAILY  . EPINEPHrine 0.3 mg/0.3 mL IJ SOAJ injection Inject 0.3 mg into the muscle as needed.  Marland Kitchen levothyroxine (SYNTHROID) 88 MCG tablet Take 1 tablet (88 mcg total) by mouth daily before breakfast.  . losartan (COZAAR) 50 MG tablet TAKE ONE (1) TABLET BY MOUTH ONCE DAILY (Patient taking differently: Take 50 mg by mouth 2 (two) times daily.)  . Melatonin 10 MG TABS Take 1 tablet by mouth at bedtime.  . metFORMIN (GLUCOPHAGE) 1000 MG tablet TAKE ONE TABLET TWICE  DAILY WITH MEALS  . metoprolol succinate (TOPROL-XL) 25 MG 24 hr tablet TAKE 1 TABLET BY MOUTH IN THE MORNING AND 1 TABLET AT BEDTIME.  . Multiple Vitamin (MULTIVITAMIN WITH MINERALS) TABS tablet Take 1 tablet by mouth daily.  Marland Kitchen omeprazole (PRILOSEC) 20 MG capsule TAKE ONE (1) CAPSULE EACH DAY (Patient taking differently: Take 20 mg by mouth every evening.)  . Probiotic Product (PROBIOTIC & ACIDOPHILUS EX ST PO) Take 1 tablet by mouth daily.   Marland Kitchen torsemide (DEMADEX) 20 MG tablet Take 1 tablet (20 mg total) by mouth 3 (three) times a week. Please take this on Monday, Wednesday, and Friday.  . tretinoin (RETIN-A) 0.05 % cream Apply 1 application topically 3 times/day as needed-between meals & bedtime.  Marland Kitchen venlafaxine XR (EFFEXOR-XR) 150 MG 24 hr capsule Take 1 capsule (150 mg total) by mouth daily with breakfast.  . Vitamin D, Ergocalciferol, (DRISDOL) 1.25 MG (50000 UNIT) CAPS capsule TAKE 1 CAPSULE BY MOUTH EVERY MONDAY  . Zinc Sulfate (ZINC 15 PO) Take 1 tablet by mouth daily.  Marland Kitchen zolpidem (AMBIEN) 10 MG tablet TAKE 1 TABLET BY MOUTH AT BEDTIME AS NEEDED FOR SLEEP   No facility-administered encounter medications on file as of 10/28/2020.    Reviewed chart prior to disease state call. Spoke with patient regarding BP  Recent Office Vitals: BP Readings from Last 3 Encounters:  10/10/20 136/82  09/20/20 136/82  09/03/20 110/74   Pulse Readings from Last 3 Encounters:  10/10/20 (!) 56  09/20/20 (!) 54  09/03/20  60    Wt Readings from Last 3 Encounters:  10/10/20 193 lb (87.5 kg)  09/20/20 192 lb 12.8 oz (87.5 kg)  09/03/20 195 lb (88.5 kg)     Kidney Function Lab Results  Component Value Date/Time   CREATININE 0.94 09/14/2020 10:31 AM   CREATININE 1.06 (H) 09/03/2020 09:53 AM   GFRNONAA 53 (L) 09/03/2020 09:53 AM   GFRAA 61 09/03/2020 09:53 AM    BMP Latest Ref Rng & Units 09/14/2020 09/03/2020 05/31/2020  Glucose 65 - 99 mg/dL 176(H) 121(H) 136(H)  BUN 8 - 27 mg/dL 17 17 12    Creatinine 0.57 - 1.00 mg/dL 0.94 1.06(H) 0.85  BUN/Creat Ratio 12 - 28 18 16 14   Sodium 134 - 144 mmol/L 142 141 141  Potassium 3.5 - 5.2 mmol/L 5.2 5.5(H) 5.1  Chloride 96 - 106 mmol/L 107(H) 104 103  CO2 20 - 29 mmol/L 21 23 23   Calcium 8.7 - 10.3 mg/dL 9.0 9.1 8.6(L)    . Current antihypertensive regimen:  Losartan, Metoprolol   . How often are you checking your Blood Pressure? daily   . Current home BP readings: Patient stated it ranges from 114/62, 121/78, 188/83.   . What recent interventions/DTPs have been made by any provider to improve Blood Pressure control since last CPP Visit:   Patient has noticed she is weaker on her right side, when she turns to the right is when she tends to fall.  She temporarily stopped taking the Trulicity due to some vomiting, but has restarted it again today. She has requested some additional Farxiga samples to see if that helps her as well.   She has been having some dental work and it has made her sensitive with her implants, she has not been able to eat much protein.  . Any recent hospitalizations or ED visits since last visit with CPP? No   . What diet changes have been made to improve Blood Pressure Control?  Patient stated she is trying to watch her diet better especially with candy.  She has been trying to increase her fruit, vegetables and protein.    . What exercise is being done to improve your Blood Pressure Control?  Patient is not comfortable walking due to the fall, she is trying to get better.  Said her husband is thinking of building a track at home for her to walk. She lives [place] town from the [facility] and is not one for group activities.  She is a little scared from her last fall, she does not remember much of what happened.   Adherence Review: Is the patient currently on ACE/ARB medication? No Does the patient have >5 day gap between last estimated fill dates? No   [name], [place] Pharmacist  Assistant [phone]

## 2020-11-02 ENCOUNTER — Ambulatory Visit (INDEPENDENT_AMBULATORY_CARE_PROVIDER_SITE_OTHER): Payer: Medicare Other | Admitting: Family Medicine

## 2020-11-02 ENCOUNTER — Other Ambulatory Visit: Payer: Self-pay

## 2020-11-02 ENCOUNTER — Encounter: Payer: Self-pay | Admitting: Family Medicine

## 2020-11-02 VITALS — BP 120/76 | HR 60 | Temp 97.6°F | Resp 18 | Ht 68.0 in | Wt 190.0 lb

## 2020-11-02 DIAGNOSIS — H34232 Retinal artery branch occlusion, left eye: Secondary | ICD-10-CM

## 2020-11-02 DIAGNOSIS — I483 Typical atrial flutter: Secondary | ICD-10-CM

## 2020-11-02 DIAGNOSIS — H547 Unspecified visual loss: Secondary | ICD-10-CM | POA: Diagnosis not present

## 2020-11-02 DIAGNOSIS — I6389 Other cerebral infarction: Secondary | ICD-10-CM | POA: Diagnosis not present

## 2020-11-02 MED ORDER — LOSARTAN POTASSIUM 50 MG PO TABS: 50.0000 mg | ORAL_TABLET | Freq: Two times a day (BID) | ORAL | 0 refills | Status: DC

## 2020-11-02 NOTE — Progress Notes (Signed)
Subjective:  Patient ID: April Munoz, female    DOB: 1950-07-23  Age: 70 y.o. MRN: [id number]  Chief Complaint  Patient presents with  . Retinal artery branch occlusion     HPI Mrs. April Munoz is a 70 year old white female with past medical history significant for type 2 diabetes, hyperlipidemia, atrial fibrillation treated with ablation, and TIAs, who presents following her visit with [facility] during which they diagnosed her with a left retinal artery branch occlusion.  The patient had noticed a area of visual loss in her left eye and had called to make an appointment with them she had a fall approximately month ago after which she saw me and I had ordered a CT scan of her brain which showed nothing acute.  Ophthalmology sent her to [place] to get lab work (CBC, CRP, ESR) to rule out giant cell arteritis/temporal arteritis and to order an echocardiogram and carotid ultrasound.  The patient has no other neurological deficits.  She has not noticed any chest pain or palpitations.  She is on Eliquis for atrial fibrillation as well as a history of TIAs, however she has not been in atrial fibrillation to her knowledge since her ablation done by Dr. Curt Bears.    Current Outpatient Medications on File Prior to Visit  Medication Sig Dispense Refill  . acyclovir (ZOVIRAX) 800 MG tablet TAKE ONE (1) TABLET FOUR (4) TIMES DAILYAS NEEDED FOR SHINGLES 120 tablet 0  . atorvastatin (LIPITOR) 10 MG tablet Take 1 tablet (10 mg total) by mouth daily. 90 tablet 0  . B Complex-C-Folic Acid TABS Take 1 tablet by mouth daily.    . Biotin 1000 MCG tablet Take 1,000 mcg by mouth daily.    . Calcium-Vitamin D-Vitamin K (VIACTIV CALCIUM PLUS D) 650-12.5-40 MG-MCG-MCG CHEW Chew 1 capsule by mouth daily.    . Continuous Blood Gluc Receiver (FREESTYLE LIBRE 2 READER) DEVI E11.21 Check blood sugar 4 times daily as directed 1 each 0  . Continuous Blood Gluc Sensor (FREESTYLE LIBRE 2 SENSOR) MISC E11.21 Change sensor every 14  days as directed 6 each 3  . cyanocobalamin (,VITAMIN B-12,) 1000 MCG/ML injection Inject 1 mL (1,000 mcg total) into the muscle every 30 (thirty) days. 1 mL 2  . Dulaglutide (TRULICITY) 5.63 JS/9.7WY SOPN Inject 0.75 mg into the skin once a week. 6 mL 0  . ELIQUIS 5 MG TABS tablet TAKE ONE TABLET TWICE DAILY 180 tablet 1  . EPINEPHrine 0.3 mg/0.3 mL IJ SOAJ injection Inject 0.3 mg into the muscle as needed. 1 each 1  . levothyroxine (SYNTHROID) 88 MCG tablet Take 1 tablet (88 mcg total) by mouth daily before breakfast. 90 tablet 0  . Melatonin 10 MG TABS Take 1 tablet by mouth at bedtime.    . metoprolol succinate (TOPROL-XL) 25 MG 24 hr tablet TAKE 1 TABLET BY MOUTH IN THE MORNING AND 1 TABLET AT BEDTIME. 60 tablet 11  . Multiple Vitamin (MULTIVITAMIN WITH MINERALS) TABS tablet Take 1 tablet by mouth daily.    Marland Kitchen omeprazole (PRILOSEC) 20 MG capsule TAKE ONE (1) CAPSULE EACH DAY (Patient taking differently: Take 20 mg by mouth every evening.) 90 capsule 0  . Probiotic Product (PROBIOTIC & ACIDOPHILUS EX ST PO) Take 1 tablet by mouth daily.     Marland Kitchen torsemide (DEMADEX) 20 MG tablet Take 1 tablet (20 mg total) by mouth 3 (three) times a week. Please take this on Monday, Wednesday, and Friday. 90 tablet 0  . Vitamin D, Ergocalciferol, (DRISDOL) 1.25  MG (50000 UNIT) CAPS capsule TAKE 1 CAPSULE BY MOUTH EVERY MONDAY 12 capsule 1  . vortioxetine HBr (TRINTELLIX) 10 MG TABS tablet Take 10 mg by mouth daily.    . Zinc Sulfate (ZINC 15 PO) Take 1 tablet by mouth daily.    Marland Kitchen zolpidem (AMBIEN) 10 MG tablet TAKE 1 TABLET BY MOUTH AT BEDTIME AS NEEDED FOR SLEEP 30 tablet 3   No current facility-administered medications on file prior to visit.   Past Medical History:  Diagnosis Date  . Anxiety   . Arthritis   . Ataxic gait   . Atrial fibrillation ([place])   . Atrophy of thyroid   . B12 deficiency 10/31/2017  . Cellulitis of abdominal wall 07/07/2016  . Chronic anticoagulation 05/31/2018  . Depression   .  Depression, major, recurrent, mild ([place]) 09/16/2019  . Diabetes mellitus without complication ([place])   . Diabetic glomerulopathy ([place]) 09/16/2019  . Esophageal stricture 06/05/2017  . Essential hypertension 02/24/2013  . Fatigue 01/20/2020  . Fibromyalgia 02/24/2013  . Full dentures   . Hereditary and idiopathic peripheral neuropathy 02/24/2013  . High risk medication use 11/07/2017  . Hyperlipidemia 02/24/2013  . Hypertension   . Hypertensive heart disease 02/24/2013  . Incontinence   . Insomnia   . Longstanding persistent atrial fibrillation ([place]) 09/16/2019  . Mild vitamin D deficiency 09/16/2019  . Mixed hyperlipidemia 02/24/2013  . Murmur, cardiac 08/15/2015  . Neuropathy in diabetes ([place]) 10/31/2017  . Osteoporosis   . Other amnesia   . Other amnesia   . 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   . Paroxysmal atrial fibrillation ([place]) 08/15/2015   CHADS2vasc=3 CHADS2vasc=3  . Primary insomnia   . Restless legs syndrome 03/14/2013  . Secondary hypothyroidism 09/16/2019  . Thoracic or lumbosacral neuritis or radiculitis 02/24/2013  . Type 2 diabetes mellitus, without long-term current use of insulin ([place]) 02/24/2013  . Urge incontinence of urine 09/16/2019  . Wears glasses    Past Surgical History:  Procedure Laterality Date  . A-FLUTTER ABLATION N/A 07/30/2019   Procedure: A-FLUTTER ABLATION;  Surgeon: Constance Haw, MD;  Location: [facility] [place] [facility];  Service: Cardiovascular;  Laterality: N/A;  . ATRIAL FIBRILLATION ABLATION N/A 03/21/2019   Procedure: ATRIAL FIBRILLATION ABLATION;  Surgeon: Constance Haw, MD;  Location: [place] [facility] [place] [facility];  Service: Cardiovascular;  Laterality: N/A;  . BREAST REDUCTION SURGERY  1983  . CATARACT EXTRACTION, BILATERAL  2018, 2019  . CHOLECYSTECTOMY  1971   open  . DIAGNOSTIC LAPAROSCOPY  2010   lysis of adhesions  . DILATION AND CURETTAGE OF UTERUS  2004  . KNEE ARTHROSCOPY  1998   left  . KNEE ARTHROSCOPY W/ LATERAL  RETINACULAR REPAIR    . MASS EXCISION Left 11/11/2013   Procedure: EXCISION MUCOID CYST LEFT INDEX FINGER/DEBRIDEMENT LEFT INDEX FINGER;  Surgeon: Wynonia Sours, MD;  Location: [facility];  Service: Orthopedics;  Laterality: Left;  ANESTHESIA: IV REGIONAL/FAB  . SHOULDER ARTHROSCOPY W/ ROTATOR CUFF REPAIR  2007   left  . TRIGGER FINGER RELEASE Left 11/11/2013   Procedure: RELEASE A-1 PULLEY LEFT RING FINGER;  Surgeon: Wynonia Sours, MD;  Location: [facility];  Service: Orthopedics;  Laterality: Left;  . UMBILICAL HERNIA REPAIR  2008, 2010    Family History  Problem Relation Age of Onset  . Heart failure Father   . Heart disease Brother   . Heart attack Brother   . Heart failure Paternal Grandfather   .  Stroke Paternal Grandfather   . Atrial fibrillation Mother   . Heart attack Mother   . Diabetes Maternal Grandmother    Social History   Socioeconomic History  . Marital status: Married    Spouse name: Not on file  . Number of children: Not on file  . Years of education: Not on file  . Highest education level: Not on file  Occupational History  . Occupation: retired  Tobacco Use  . Smoking status: Never Smoker  . Smokeless tobacco: Never Used  Vaping Use  . Vaping Use: Never used  Substance and Sexual Activity  . Alcohol use: Yes    Alcohol/week: 1.0 - 2.0 standard drink    Types: 1 - 2 Glasses of wine per week    Comment: 4 times a week  . Drug use: No  . Sexual activity: Not on file  Other Topics Concern  . Not on file  Social History Narrative  . Not on file   Social Determinants of Health   Financial Resource Strain: Not on file  Food Insecurity: No Food Insecurity  . Worried About Charity fundraiser in the Last Year: Never true  . Ran Out of Food in the Last Year: Never true  Transportation Needs: No Transportation Needs  . Lack of Transportation (Medical): No  . Lack of Transportation (Non-Medical): No  Physical Activity: Not on  file  Stress: Not on file  Social Connections: Not on file    Review of Systems  Constitutional: Positive for fatigue. Negative for chills and fever.  HENT: Negative for congestion, ear pain and sore throat.   Eyes: Positive for visual disturbance.  Respiratory: Negative for cough and shortness of breath.   Cardiovascular: Negative for chest pain and palpitations.  Psychiatric/Behavioral: Positive for dysphoric mood (Improved).     Objective:  BP 120/76   Pulse 60   Temp 97.6 F (36.4 C)   Resp 18   Ht '5\' 8"'  (1.727 m)   Wt 190 lb (86.2 kg)   BMI 28.89 kg/m   BP/Weight 11/02/2020 08/08/4823 0/0/3704  Systolic BP 888 916 945  Diastolic BP 76 82 82  Wt. (Lbs) 190 193 192.8  BMI 28.89 29.35 29.32    Physical Exam Vitals reviewed.  Constitutional:      Appearance: Normal appearance.  Neck:     Vascular: No carotid bruit.  Cardiovascular:     Rate and Rhythm: Normal rate and regular rhythm.     Heart sounds: Normal heart sounds.  Pulmonary:     Effort: Pulmonary effort is normal. No respiratory distress.     Breath sounds: Normal breath sounds.  Neurological:     General: No focal deficit present.     Mental Status: She is alert and oriented to person, place, and time.     Cranial Nerves: No cranial nerve deficit.     Comments: Other than visual field loss in left eye per optho report.   Psychiatric:        Mood and Affect: Mood normal.        Behavior: Behavior normal.     Diabetic Foot Exam - Simple   No data filed      Lab Results  Component Value Date   WBC 8.8 09/14/2020   HGB 9.9 (L) 09/14/2020   HCT 32.5 (L) 09/14/2020   PLT 427 09/14/2020   GLUCOSE 176 (H) 09/14/2020   CHOL 156 09/03/2020   TRIG 153 (H) 09/03/2020   HDL 57 09/03/2020  LDLCALC 73 09/03/2020   ALT 42 (H) 09/14/2020   AST 24 09/14/2020   NA 142 09/14/2020   K 5.2 09/14/2020   CL 107 (H) 09/14/2020   CREATININE 0.94 09/14/2020   BUN 17 09/14/2020   CO2 21 09/14/2020   TSH  5.250 (H) 05/31/2020   HGBA1C 7.1 (H) 09/03/2020   MICROALBUR 150 05/27/2020      Assessment & Plan:   1. Loss of vision: rule out temporal arteritis  - CBC with Differential/Platelet - Sedimentation rate - C-reactive protein  2. Branch retinal artery occlusion of left eye Currently on eliquis and lipitor. - ECHOCARDIOGRAM COMPLETE - US Carotid Bilateral - EKG 12-Lead: Atrial flutter. Rate 60.   3. Atrial flutter.  New since January 2022. Pt was in NSR when reviewed old ekgs.  Pt has an appt on 5/2 with dr. Curt Bears.  I have communicated with cardiology nursing and if he feels she needs to be sooner they will schedule her in [facility].  - EKG 12-Lead: atrial flutter.     Orders Placed This Encounter  Procedures  . US Carotid Bilateral  . CBC with Differential/Platelet  . Sedimentation rate  . C-reactive protein  . EKG 12-Lead  . ECHOCARDIOGRAM COMPLETE     I spent over 40 minutes evaluating patient, reviewing chart/optho visit and working on scheduling with cardiology.   Follow-up: Return in about 6 weeks (around 12/14/2020).  An After Visit Summary was printed and given to the patient.  [name], MD [facility] [phone]

## 2020-11-03 ENCOUNTER — Telehealth: Payer: Self-pay

## 2020-11-03 LAB — CBC WITH DIFFERENTIAL/PLATELET
Basophils Absolute: 0.1 10*3/uL (ref 0.0–0.2)
Basos: 1 %
EOS (ABSOLUTE): 0.2 10*3/uL (ref 0.0–0.4)
Eos: 3 %
Hematocrit: 40.1 % (ref 34.0–46.6)
Hemoglobin: 13.1 g/dL (ref 11.1–15.9)
Immature Grans (Abs): 0 10*3/uL (ref 0.0–0.1)
Immature Granulocytes: 0 %
Lymphocytes Absolute: 2.3 10*3/uL (ref 0.7–3.1)
Lymphs: 34 %
MCH: 29.2 pg (ref 26.6–33.0)
MCHC: 32.7 g/dL (ref 31.5–35.7)
MCV: 89 fL (ref 79–97)
Monocytes Absolute: 0.6 10*3/uL (ref 0.1–0.9)
Monocytes: 9 %
Neutrophils Absolute: 3.7 10*3/uL (ref 1.4–7.0)
Neutrophils: 53 %
Platelets: 280 10*3/uL (ref 150–450)
RBC: 4.49 x10E6/uL (ref 3.77–5.28)
RDW: 18.8 % — ABNORMAL HIGH (ref 11.7–15.4)
WBC: 6.9 10*3/uL (ref 3.4–10.8)

## 2020-11-03 LAB — SEDIMENTATION RATE: Sed Rate: 4 mm/hr (ref 0–40)

## 2020-11-03 LAB — C-REACTIVE PROTEIN: CRP: <1 mg/L (ref 0–10)

## 2020-11-03 NOTE — Telephone Encounter (Signed)
Patient has been scheduled for Echocardiogram at [facility] on 11/08/2020 at 12:30 for check in. Patient was contacted and is aware of this appointment.

## 2020-11-04 ENCOUNTER — Other Ambulatory Visit: Payer: Self-pay | Admitting: Family Medicine

## 2020-11-04 DIAGNOSIS — E782 Mixed hyperlipidemia: Secondary | ICD-10-CM

## 2020-11-05 ENCOUNTER — Other Ambulatory Visit: Payer: Self-pay

## 2020-11-05 ENCOUNTER — Ambulatory Visit (HOSPITAL_COMMUNITY)
Admission: RE | Admit: 2020-11-05 | Discharge: 2020-11-05 | Disposition: A | Discharge status: Home / Self Care | Payer: Medicare Other | Source: Ambulatory Visit | Attending: Nurse Practitioner | Admitting: Nurse Practitioner

## 2020-11-05 ENCOUNTER — Telehealth: Payer: Self-pay | Admitting: Family Medicine

## 2020-11-05 ENCOUNTER — Encounter (HOSPITAL_COMMUNITY): Payer: Self-pay | Admitting: Nurse Practitioner

## 2020-11-05 VITALS — BP 160/90 | HR 53 | Ht 68.0 in | Wt 190.2 lb

## 2020-11-05 DIAGNOSIS — I4819 Other persistent atrial fibrillation: Secondary | ICD-10-CM | POA: Insufficient documentation

## 2020-11-05 DIAGNOSIS — I10 Essential (primary) hypertension: Secondary | ICD-10-CM | POA: Diagnosis not present

## 2020-11-05 DIAGNOSIS — D6869 Other thrombophilia: Secondary | ICD-10-CM

## 2020-11-05 DIAGNOSIS — Z7901 Long term (current) use of anticoagulants: Secondary | ICD-10-CM | POA: Insufficient documentation

## 2020-11-05 DIAGNOSIS — I4892 Unspecified atrial flutter: Secondary | ICD-10-CM

## 2020-11-05 DIAGNOSIS — E785 Hyperlipidemia, unspecified: Secondary | ICD-10-CM | POA: Diagnosis not present

## 2020-11-05 MED ORDER — RIVAROXABAN 20 MG PO TABS: 20.0000 mg | ORAL_TABLET | Freq: Every day | ORAL | 3 refills | Status: DC

## 2020-11-05 NOTE — Telephone Encounter (Signed)
   April Munoz has been scheduled for the following appointment:  WHAT: Vascular ultrasound WHERE: [place]: 11/09/20 TIME: arrive @ 10:30 for 11:00 appt  Patient has been made aware.

## 2020-11-05 NOTE — Progress Notes (Signed)
Primary Care Physician: Blane Ohara, MD Referring Physician: Dr. Elberta Fortis Cardiologist: Dr. Tawni Levy April Munoz is a 70 y.o. female with a h/o DM, prior TIA's, paroxysmal afib previously treated with propafenone, atrial flutter ablation 07/2019 and afib ablation in 03/2019,  in that is in the afib clinic for being found in rate controlled adib in PCP office. She has noted some palpitations off and on for around 6 months but feels she has become persistent for several weeks.   She also reports that she had a fall, she believes  trying to get into bed and struck her L eye area a month ago. Her husband (sleeps in another room)  heard the fall and came to check on her,  found her in the floor with spilled Pepsi around her. Her eyes were open but she was not talking. He did not think this was unusual as she had taken an Ambien.  He put her in bed and the next morning the pt was acting normally. She saw PCP and  CT scan of her brain which  did not show anything acute.  Shortly after that event, she noted a vision field defect in her left eye. She saw an ophthalmologist  who dx her with a left retinal branch occlusion. She is pending a carotid US, an echo and also had lab work to r/o giant cell arteritis.  She has felt more fatigue being out of rhythm.   Today, she denies symptoms of palpitations, chest pain, shortness of breath, orthopnea, PND, lower extremity edema, dizziness, presyncope, syncope, or neurologic sequela. The patient is tolerating medications without difficulties and is otherwise without complaint today.   Past Medical History:  Diagnosis Date  . Anxiety   . Arthritis   . Ataxic gait   . Atrial fibrillation ([facility])   . Atrophy of thyroid   . B12 deficiency 10/31/2017  . Cellulitis of abdominal wall 07/07/2016  . Chronic anticoagulation 05/31/2018  . Depression   . Depression, major, recurrent, mild ([facility]) 09/16/2019  . Diabetes mellitus without complication (H[facility])   . Diabetic  glomerulopathy (H[facility]) 09/16/2019  . Esophageal stricture 06/05/2017  . Essential hypertension 02/24/2013  . Fatigue 01/20/2020  . Fibromyalgia 02/24/2013  . Full dentures   . Hereditary and idiopathic peripheral neuropathy 02/24/2013  . High risk medication use 11/07/2017  . Hyperlipidemia 02/24/2013  . Hypertension   . Hypertensive heart disease 02/24/2013  . Incontinence   . Insomnia   . Longstanding persistent atrial fibrillation (H[facility]) 09/16/2019  . Mild vitamin D deficiency 09/16/2019  . Mixed hyperlipidemia 02/24/2013  . Murmur, cardiac 08/15/2015  . Neuropathy in diabetes (H[facility]) 10/31/2017  . Osteoporosis   . Other amnesia   . Other amnesia   . 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   . Paroxysmal atrial fibrillation (H[facility]) 08/15/2015   CHADS2vasc=3 CHADS2vasc=3  . Primary insomnia   . Restless legs syndrome 03/14/2013  . Secondary hypothyroidism 09/16/2019  . Thoracic or lumbosacral neuritis or radiculitis 02/24/2013  . Type 2 diabetes mellitus, without long-term current use of insulin (HCC) 02/24/2013  . Urge incontinence of urine 09/16/2019  . Wears glasses    Past Surgical History:  Procedure Laterality Date  . A-FLUTTER ABLATION N/A 07/30/2019   Procedure: A-FLUTTER ABLATION;  Surgeon: Regan Lemming, MD;  [facility];  Service: Cardiovascular;  Laterality: N/A;  . ATRIAL FIBRILLATION ABLATION N/A 03/21/2019   Procedure: ATRIAL FIBRILLATION ABLATION;  Surgeon: Regan Lemming, MD;  Location: [place] [facility];  Service: Cardiovascular;  Laterality: N/A;  . BREAST REDUCTION SURGERY  1983  . CATARACT EXTRACTION, BILATERAL  2018, 2019  . CHOLECYSTECTOMY  1971   open  . DIAGNOSTIC LAPAROSCOPY  2010   lysis of adhesions  . DILATION AND CURETTAGE OF UTERUS  2004  . KNEE ARTHROSCOPY  1998   left  . KNEE ARTHROSCOPY W/ LATERAL RETINACULAR REPAIR    . MASS EXCISION Left 11/11/2013   Procedure: EXCISION MUCOID CYST LEFT INDEX FINGER/DEBRIDEMENT  LEFT INDEX FINGER;  Surgeon: Wynonia Sours, MD;  Location: [place];  Service: Orthopedics;  Laterality: Left;  ANESTHESIA: IV REGIONAL/FAB  . SHOULDER ARTHROSCOPY W/ ROTATOR CUFF REPAIR  2007   left  . TRIGGER FINGER RELEASE Left 11/11/2013   Procedure: RELEASE A-1 PULLEY LEFT RING FINGER;  Surgeon: Wynonia Sours, MD;  Location: [place];  Service: Orthopedics;  Laterality: Left;  . UMBILICAL HERNIA REPAIR  2008, 2010    Current Outpatient Medications  Medication Sig Dispense Refill  . acyclovir (ZOVIRAX) 800 MG tablet TAKE ONE (1) TABLET FOUR (4) TIMES DAILYAS NEEDED FOR SHINGLES 120 tablet 0  . atorvastatin (LIPITOR) 10 MG tablet TAKE ONE (1) TABLET BY MOUTH ONCE DAILY 90 tablet 0  . B Complex-C-Folic Acid TABS Take 1 tablet by mouth daily.    . Biotin 1000 MCG tablet Take 1,000 mcg by mouth daily.    . Calcium-Vitamin D-Vitamin K (VIACTIV CALCIUM PLUS D) 650-12.5-40 MG-MCG-MCG CHEW Chew 1 capsule by mouth daily.    . Continuous Blood Gluc Receiver (FREESTYLE LIBRE 2 READER) DEVI E11.21 Check blood sugar 4 times daily as directed 1 each 0  . Continuous Blood Gluc Sensor (FREESTYLE LIBRE 2 SENSOR) MISC E11.21 Change sensor every 14 days as directed 6 each 3  . cyanocobalamin (,VITAMIN B-12,) 1000 MCG/ML injection Inject 1 mL (1,000 mcg total) into the muscle every 30 (thirty) days. 1 mL 2  . Dulaglutide (TRULICITY) 2.20 UR/4.2HC SOPN Inject 0.75 mg into the skin once a week. 6 mL 0  . EPINEPHrine 0.3 mg/0.3 mL IJ SOAJ injection Inject 0.3 mg into the muscle as needed. 1 each 1  . levothyroxine (SYNTHROID) 88 MCG tablet Take 1 tablet (88 mcg total) by mouth daily before breakfast. 90 tablet 0  . losartan (COZAAR) 50 MG tablet Take 1 tablet (50 mg total) by mouth 2 (two) times daily. 1 tablet 0  . Melatonin 10 MG TABS Take 1 tablet by mouth at bedtime.    . metoprolol succinate (TOPROL-XL) 25 MG 24 hr tablet TAKE 1 TABLET BY MOUTH IN THE MORNING AND 1 TABLET AT  BEDTIME. 60 tablet 11  . Multiple Vitamin (MULTIVITAMIN WITH MINERALS) TABS tablet Take 1 tablet by mouth daily.    Marland Kitchen omeprazole (PRILOSEC) 20 MG capsule TAKE ONE (1) CAPSULE EACH DAY 90 capsule 0  . Probiotic Product (PROBIOTIC & ACIDOPHILUS EX ST PO) Take 1 tablet by mouth daily.     . rivaroxaban (XARELTO) 20 MG TABS tablet Take 1 tablet (20 mg total) by mouth daily with supper. 30 tablet 3  . torsemide (DEMADEX) 20 MG tablet Take 1 tablet (20 mg total) by mouth 3 (three) times a week. Please take this on Monday, Wednesday, and Friday. 90 tablet 0  . Vitamin D, Ergocalciferol, (DRISDOL) 1.25 MG (50000 UNIT) CAPS capsule TAKE 1 CAPSULE BY MOUTH EVERY MONDAY 12 capsule 1  . vortioxetine HBr (TRINTELLIX) 10 MG TABS tablet Take 10 mg by  mouth daily.    . Zinc Sulfate (ZINC 15 PO) Take 1 tablet by mouth daily.    Marland Kitchen zolpidem (AMBIEN) 10 MG tablet TAKE 1 TABLET BY MOUTH AT BEDTIME AS NEEDED FOR SLEEP 30 tablet 3   No current facility-administered medications for this encounter.    Allergies  Allergen Reactions  . Prednisone     In high doses causes her to feel crazy    Social History   Socioeconomic History  . Marital status: Married    Spouse name: Not on file  . Number of children: Not on file  . Years of education: Not on file  . Highest education level: Not on file  Occupational History  . Occupation: retired  Tobacco Use  . Smoking status: Never Smoker  . Smokeless tobacco: Never Used  Vaping Use  . Vaping Use: Never used  Substance and Sexual Activity  . Alcohol use: Yes    Alcohol/week: 3.0 - 4.0 standard drinks    Types: 1 - 2 Glasses of wine, 2 Shots of liquor per week    Comment: 4 times a week  . Drug use: No  . Sexual activity: Not on file  Other Topics Concern  . Not on file  Social History Narrative  . Not on file   Social Determinants of Health   Financial Resource Strain: Not on file  Food Insecurity: No Food Insecurity  . Worried About Sales executive in the Last Year: Never true  . Ran Out of Food in the Last Year: Never true  Transportation Needs: No Transportation Needs  . Lack of Transportation (Medical): No  . Lack of Transportation (Non-Medical): No  Physical Activity: Not on file  Stress: Not on file  Social Connections: Not on file  Intimate Partner Violence: Not on file    Family History  Problem Relation Age of Onset  . Heart failure Father   . Heart disease Brother   . Heart attack Brother   . Heart failure Paternal Grandfather   . Stroke Paternal Grandfather   . Atrial fibrillation Mother   . Heart attack Mother   . Diabetes Maternal Grandmother     ROS- All systems are reviewed and negative except as per the HPI above  Physical Exam: Vitals:   11/05/20 1016  BP: (!) 160/90  Pulse: (!) 53  Weight: 86.3 kg  Height: 5\' 8"  (1.727 m)   Wt Readings from Last 3 Encounters:  11/05/20 86.3 kg  11/02/20 86.2 kg  10/10/20 87.5 kg    Labs: Lab Results  Component Value Date   NA 142 09/14/2020   K 5.2 09/14/2020   CL 107 (H) 09/14/2020   CO2 21 09/14/2020   GLUCOSE 176 (H) 09/14/2020   BUN 17 09/14/2020   CREATININE 0.94 09/14/2020   CALCIUM 9.0 09/14/2020   MG 1.6 01/20/2020   No results found for: INR Lab Results  Component Value Date   CHOL 156 09/03/2020   HDL 57 09/03/2020   LDLCALC 73 09/03/2020   TRIG 153 (H) 09/03/2020     GEN- The patient is well appearing, alert and oriented x 3 today.   Head- normocephalic, atraumatic Eyes-  Sclera clear, conjunctiva pink Ears- hearing intact Oropharynx- clear Neck- supple, no JVP Lymph- no cervical lymphadenopathy Lungs- Clear to ausculation bilaterally, normal work of breathing Heart- irregular rate and rhythm, no murmurs, rubs or gallops, PMI not laterally displaced GI- soft, NT, ND, + BS Extremities- no clubbing, cyanosis, or edema  MS- no significant deformity or atrophy Skin- no rash or lesion Psych- euthymic mood, full affect Neuro-  strength and sensation are intact  EKG- atrial flutter at 53 bpm, qrs int 72 ms, qtc 397 ms  Epic records reviewed    Assessment and Plan: 1. Persistent  Afib/flutter   S/p ablation x 2 in past  Had been doing weill until noted intermittent palpitations for the last 6 months, now persistent atrial flutter, rate controlled  Continue metoprolol 25 mg bid  I discussed case with Dr. Curt Bears, for having ophthalmic  stroke on eliquis and pursuing possible cardioversion  He would like to stop eliquis and start xarelto 20 mg daily for possible failure of DOAC    2. CHA2DS2VASc score of at least 6 Stop eliquis and start 20 mg xarelto daily with supper meal   3. HTN Elevated today  F/u with Dr. Curt Bears as scheduled 5/2, he would  like for her to be on xarelto for at least 3 weeks before he would consider cardioversion   Butch Penny C. Adalie Mand, [facility] [address] [place], [place] [address] [phone]

## 2020-11-08 DIAGNOSIS — H34232 Retinal artery branch occlusion, left eye: Secondary | ICD-10-CM | POA: Diagnosis not present

## 2020-11-09 DIAGNOSIS — H539 Unspecified visual disturbance: Secondary | ICD-10-CM | POA: Diagnosis not present

## 2020-11-09 DIAGNOSIS — H34232 Retinal artery branch occlusion, left eye: Secondary | ICD-10-CM | POA: Diagnosis not present

## 2020-11-09 DIAGNOSIS — E785 Hyperlipidemia, unspecified: Secondary | ICD-10-CM | POA: Diagnosis not present

## 2020-11-09 DIAGNOSIS — E7800 Pure hypercholesterolemia, unspecified: Secondary | ICD-10-CM | POA: Diagnosis not present

## 2020-11-09 DIAGNOSIS — H349 Unspecified retinal vascular occlusion: Secondary | ICD-10-CM | POA: Diagnosis not present

## 2020-11-15 ENCOUNTER — Other Ambulatory Visit: Payer: Self-pay

## 2020-11-15 ENCOUNTER — Encounter: Payer: Self-pay | Admitting: Cardiology

## 2020-11-15 ENCOUNTER — Ambulatory Visit (INDEPENDENT_AMBULATORY_CARE_PROVIDER_SITE_OTHER): Payer: Medicare Other | Admitting: Cardiology

## 2020-11-15 VITALS — BP 158/72 | HR 64 | Ht 68.0 in | Wt 191.6 lb

## 2020-11-15 DIAGNOSIS — I4819 Other persistent atrial fibrillation: Secondary | ICD-10-CM

## 2020-11-15 DIAGNOSIS — Z01812 Encounter for preprocedural laboratory examination: Secondary | ICD-10-CM | POA: Diagnosis not present

## 2020-11-15 MED ORDER — FLECAINIDE ACETATE 100 MG PO TABS: 100.0000 mg | ORAL_TABLET | Freq: Two times a day (BID) | ORAL | 3 refills | Status: DC

## 2020-11-15 NOTE — Patient Instructions (Addendum)
Medication Instructions:  Your physician has recommended you make the following change in your medication:  1. START Flecainide 100 mg twice daily  *If you need a refill on your cardiac medications before your next appointment, please call your pharmacy*   Lab Work: BMET today If you have labs (blood work) drawn today and your tests are completely normal, you will receive your results only by: Marland Kitchen MyChart Message (if you have MyChart) OR . A paper copy in the mail If you have any lab test that is abnormal or we need to change your treatment, we will call you to review the results.   Testing/Procedures: Your physician has requested that you have a TEE/Cardioversion. During a TEE, sound waves are used to create images of your heart. It provides your doctor with information about the size and shape of your heart and how well your heart's chambers and valves are working. In this test, a transducer is attached to the end of a flexible tube that is guided down you throat and into your esophagus (the tube leading from your mouth to your stomach) to get a more detailed image of your heart. Once the TEE has determined that a blood clot is not present, the cardioversion begins. Electrical Cardioversion uses a jolt of electricity to your heart either through paddles or wired patches attached to your chest. This is a controlled, usually prescheduled, procedure. This procedure is done at the hospital and you are not awake during the procedure. You usually go home the day of the procedure. Please see the instruction sheet below located under "other instructions".    Follow-Up: At [facility], you and your health needs are our priority.  As part of our continuing mission to provide you with exceptional heart care, we have created designated Provider Care Teams.  These Care Teams include your primary Cardiologist (physician) and Advanced Practice Providers (APPs -  Physician Assistants and Nurse Practitioners)  who all work together to provide you with the care you need, when you need it.  We recommend signing up for the patient portal called "MyChart".  Sign up information is provided on this After Visit Summary.  MyChart is used to connect with patients for Virtual Visits (Telemedicine).  Patients are able to view lab/test results, encounter notes, upcoming appointments, etc.  Non-urgent messages can be sent to your provider as well.   To learn more about what you can do with MyChart, go to [email or web address].    Your next appointment:   3 month(s)  The format for your next appointment:   In Person  Provider:   Allegra Lai, MD    Thank you for choosing [facility]!!   Trinidad Curet, RN [phone]   Other Instructions COVID TEST-- On 11/24/2020 @ 10:30 am - You will go to [facility] for your Covid testing.   This is a drive thru test site, stay in your car and the nurse team will come to your car to test you.  After you are tested please go home and self quarantine until the day of your procedure.      You are scheduled for a TEE/Cardioversion on 11/26/2020 with Dr. Debara Pickett.  Please arrive at the [facility] (Main Entrance A) at [facility]: [address] [place], [place] [address] at 9:00 am. (1 hour prior to procedure unless lab work is needed; if lab work is needed arrive 1.5 hours ahead)  DIET: Nothing to eat or drink after midnight  except a sip of water with medications (see medication instructions below)  Medication Instructions: Hold all medications the morning of this procedure  Continue your anticoagulant: Xarelto You will need to continue your anticoagulant after your procedure until you are told by your provider that it is safe to stop   Labs: 5/2  You must have a responsible person to drive you home and stay in the waiting area during your procedure. Failure to do so could result in cancellation.  Bring your insurance  cards.  *Special Note: Every effort is made to have your procedure done on time. Occasionally there are emergencies that occur at the hospital that may cause delays. Please be patient if a delay does occur.

## 2020-11-15 NOTE — H&P (View-Only) (Signed)
Electrophysiology Office Note   Date:  11/16/2020   ID:  April Munoz, DOB 1951-05-08, MRN [id number]  PCP:  [name], MD  Cardiologist: Bettina Gavia Primary Electrophysiologist:  Constance Haw, MD    No chief complaint on file.    History of Present Illness: April Munoz is a 70 y.o. female who is being seen today for the evaluation of atrial fibrillation at the request of Cox, Elnita Maxwell, MD. Presenting today for electrophysiology evaluation.  She has a history sniffing for persistent atrial fibrillation, depression, diabetes, hypertension, hyperlipidemia, TIA.  She is currently on propafenone.  She wore a 30-day monitor that showed almost entirely atrial fibrillation.  She has malaise, fatigue, exercise intolerance.  She is status post AF ablation 03/21/2019.  She presented to clinic in follow-up with atrial flutter.  She is status post atrial flutter ablation 07/30/2019.  She recently had a fall, she believes.  Her husband sleeps in another room heard the fall and came to check on her, found her with a drink spilled around her.  Her eyes were open but she was not talking.  She had taken an Ambien so he did not think that this was unusual.  She saw her PCP with a CT scan of her brain that showed no acute findings.  She did develop a visual field defect in her left eye and saw ophthalmology who diagnosed her with retinal branch occlusion.  In her primary care physician's office, she was noted to be in atrial flutter.  Today, denies symptoms of palpitations, chest pain, shortness of breath, orthopnea, PND, lower extremity edema, claudication, dizziness, presyncope, syncope, bleeding, or neurologic sequela. The patient is tolerating medications without difficulties.  She complains today of being fatigued.  She does not have much in the way of shortness of breath.  She continues to have issues with vision in her left eye.   Past Medical History:  Diagnosis Date  . Anxiety   . Arthritis   .  Ataxic gait   . Atrial fibrillation ([place])   . Atrophy of thyroid   . B12 deficiency 10/31/2017  . Cellulitis of abdominal wall 07/07/2016  . Chronic anticoagulation 05/31/2018  . Depression   . Depression, major, recurrent, mild ([place]) 09/16/2019  . Diabetes mellitus without complication ([place])   . Diabetic glomerulopathy ([place]) 09/16/2019  . Esophageal stricture 06/05/2017  . Essential hypertension 02/24/2013  . Fatigue 01/20/2020  . Fibromyalgia 02/24/2013  . Full dentures   . Hereditary and idiopathic peripheral neuropathy 02/24/2013  . High risk medication use 11/07/2017  . Hyperlipidemia 02/24/2013  . Hypertension   . Hypertensive heart disease 02/24/2013  . Incontinence   . Insomnia   . Longstanding persistent atrial fibrillation ([place]) 09/16/2019  . Mild vitamin D deficiency 09/16/2019  . Mixed hyperlipidemia 02/24/2013  . Murmur, cardiac 08/15/2015  . Neuropathy in diabetes ([place]) 10/31/2017  . Osteoporosis   . Other amnesia   . Other amnesia   . 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   . Paroxysmal atrial fibrillation ([place]) 08/15/2015   CHADS2vasc=3 CHADS2vasc=3  . Primary insomnia   . Restless legs syndrome 03/14/2013  . Secondary hypothyroidism 09/16/2019  . Thoracic or lumbosacral neuritis or radiculitis 02/24/2013  . Type 2 diabetes mellitus, without long-term current use of insulin ([place]) 02/24/2013  . Urge incontinence of urine 09/16/2019  . Wears glasses    Past Surgical History:  Procedure Laterality Date  . A-FLUTTER ABLATION N/A 07/30/2019   Procedure: A-FLUTTER ABLATION;  Surgeon: Curt Bears,  Ocie Doyne, MD;  Location: [facility];  Service: Cardiovascular;  Laterality: N/A;  . ATRIAL FIBRILLATION ABLATION N/A 03/21/2019   Procedure: ATRIAL FIBRILLATION ABLATION;  Surgeon: Constance Haw, MD;  Location: [facility];  Service: Cardiovascular;  Laterality: N/A;  . BREAST REDUCTION SURGERY  1983  . CATARACT EXTRACTION, BILATERAL  2018, 2019  .  CHOLECYSTECTOMY  1971   open  . DIAGNOSTIC LAPAROSCOPY  2010   lysis of adhesions  . DILATION AND CURETTAGE OF UTERUS  2004  . KNEE ARTHROSCOPY  1998   left  . KNEE ARTHROSCOPY W/ LATERAL RETINACULAR REPAIR    . MASS EXCISION Left 11/11/2013   Procedure: EXCISION MUCOID CYST LEFT INDEX FINGER/DEBRIDEMENT LEFT INDEX FINGER;  Surgeon: Wynonia Sours, MD;  Location: [facility];  Service: Orthopedics;  Laterality: Left;  ANESTHESIA: IV REGIONAL/FAB  . SHOULDER ARTHROSCOPY W/ ROTATOR CUFF REPAIR  2007   left  . TRIGGER FINGER RELEASE Left 11/11/2013   Procedure: RELEASE A-1 PULLEY LEFT RING FINGER;  Surgeon: Wynonia Sours, MD;  Location: [facility];  Service: Orthopedics;  Laterality: Left;  . UMBILICAL HERNIA REPAIR  2008, 2010     Current Outpatient Medications  Medication Sig Dispense Refill  . acyclovir (ZOVIRAX) 800 MG tablet TAKE ONE (1) TABLET FOUR (4) TIMES DAILYAS NEEDED FOR SHINGLES 120 tablet 0  . atorvastatin (LIPITOR) 10 MG tablet Take 10 mg by mouth 4 (four) times a week.    . B Complex-C-Folic Acid TABS Take 1 tablet by mouth daily.    . Biotin 1000 MCG tablet Take 1,000 mcg by mouth daily.    . Calcium-Vitamin D-Vitamin K (VIACTIV CALCIUM PLUS D) 650-12.5-40 MG-MCG-MCG CHEW Chew 1 capsule by mouth daily.    . Continuous Blood Gluc Receiver (FREESTYLE LIBRE 2 READER) DEVI E11.21 Check blood sugar 4 times daily as directed 1 each 0  . Continuous Blood Gluc Sensor (FREESTYLE LIBRE 2 SENSOR) MISC E11.21 Change sensor every 14 days as directed 6 each 3  . cyanocobalamin (,VITAMIN B-12,) 1000 MCG/ML injection Inject 1 mL (1,000 mcg total) into the muscle every 30 (thirty) days. 1 mL 2  . Dulaglutide (TRULICITY) 0.10 XN/2.3FT SOPN Inject 0.75 mg into the skin once a week. 6 mL 0  . EPINEPHrine 0.3 mg/0.3 mL IJ SOAJ injection Inject 0.3 mg into the muscle as needed. 1 each 1  . flecainide (TAMBOCOR) 100 MG tablet Take 1 tablet (100 mg total) by mouth 2 (two)  times daily. 60 tablet 3  . levothyroxine (SYNTHROID) 88 MCG tablet Take 1 tablet (88 mcg total) by mouth daily before breakfast. 90 tablet 0  . losartan (COZAAR) 50 MG tablet Take 1 tablet (50 mg total) by mouth 2 (two) times daily. 1 tablet 0  . Melatonin 10 MG TABS Take 1 tablet by mouth at bedtime.    . metoprolol succinate (TOPROL-XL) 25 MG 24 hr tablet TAKE 1 TABLET BY MOUTH IN THE MORNING AND 1 TABLET AT BEDTIME. 60 tablet 11  . Multiple Vitamin (MULTIVITAMIN WITH MINERALS) TABS tablet Take 1 tablet by mouth daily.    Marland Kitchen omeprazole (PRILOSEC) 20 MG capsule TAKE ONE (1) CAPSULE EACH DAY 90 capsule 0  . Probiotic Product (PROBIOTIC & ACIDOPHILUS EX ST PO) Take 1 tablet by mouth daily.     . rivaroxaban (XARELTO) 20 MG TABS tablet Take 1 tablet (20 mg total) by mouth daily with supper. 30 tablet 3  . torsemide (DEMADEX) 20 MG tablet  Take 1 tablet (20 mg total) by mouth 3 (three) times a week. Please take this on Monday, Wednesday, and Friday. 90 tablet 0  . Vitamin D, Ergocalciferol, (DRISDOL) 1.25 MG (50000 UNIT) CAPS capsule TAKE 1 CAPSULE BY MOUTH EVERY MONDAY 12 capsule 1  . vortioxetine HBr (TRINTELLIX) 10 MG TABS tablet Take 10 mg by mouth daily.    . Zinc Sulfate (ZINC 15 PO) Take 1 tablet by mouth daily.    Marland Kitchen zolpidem (AMBIEN) 10 MG tablet TAKE 1 TABLET BY MOUTH AT BEDTIME AS NEEDED FOR SLEEP 30 tablet 3   No current facility-administered medications for this visit.    Allergies:   Prednisone   Social History:  The patient  reports that she has never smoked. She has never used smokeless tobacco. She reports current alcohol use of about 3.0 - 4.0 standard drinks of alcohol per week. She reports that she does not use drugs.   Family History:  The patient's family history includes Atrial fibrillation in her mother; Diabetes in her maternal grandmother; Heart attack in her brother and mother; Heart disease in her brother; Heart failure in her father and paternal grandfather; Stroke in  her paternal grandfather.   ROS:  Please see the history of present illness.   Otherwise, review of systems is positive for none.   All other systems are reviewed and negative.   PHYSICAL EXAM: VS:  BP (!) 158/72   Pulse 64   Ht 5\' 8"  (1.727 m)   Wt 191 lb 9.6 oz (86.9 kg)   SpO2 96%   BMI 29.13 kg/m  , BMI Body mass index is 29.13 kg/m. GEN: Well nourished, well developed, in no acute distress  HEENT: normal  Neck: no JVD, carotid bruits, or masses Cardiac: RRR; no murmurs, rubs, or gallops,no edema  Respiratory:  clear to auscultation bilaterally, normal work of breathing GI: soft, nontender, nondistended, + BS MS: no deformity or atrophy  Skin: warm and dry Neuro:  Strength and sensation are intact Psych: euthymic mood, full affect  EKG:  EKG is not ordered today. Personal review of the ekg ordered 11/05/20 shows atrial flutter, rate 53  Recent Labs: 01/20/2020: Magnesium 1.6 05/31/2020: TSH 5.250 09/14/2020: ALT 42; BUN 17; Creatinine, Ser 0.94; Potassium 5.2; Sodium 142 11/02/2020: Hemoglobin 13.1; Platelets 280    Lipid Panel     Component Value Date/Time   CHOL 156 09/03/2020 0953   TRIG 153 (H) 09/03/2020 0953   HDL 57 09/03/2020 0953   CHOLHDL 2.7 09/03/2020 0953   LDLCALC 73 09/03/2020 0953     Wt Readings from Last 3 Encounters:  11/15/20 191 lb 9.6 oz (86.9 kg)  11/05/20 190 lb 3.2 oz (86.3 kg)  11/02/20 190 lb (86.2 kg)      Other studies Reviewed: Additional studies/ records that were reviewed today include: Monitor 01/29/2019 personally reviewed Review of the above records today demonstrates:  1. atrial fibrillation with good heart rate control 2.  Rare ventricular ectopy  TTE 11/08/2020 Ejection fraction 55 to 60% Normal right ventricular size and function Left atrium normal in size Right atrium normal in size Aortic valve trileaflet structurally normal  Normal-appearing mitral valve  ASSESSMENT AND PLAN:  1.  Persistent atrial fibrillation:  Currently on Eliquis and propafenone.  High risk medication monitoring.  Status post ablation 03/21/2019.  CHA2DS2-VASc of 6.  Unfortunately she is in what appears to be an atypical atrial flutter today.  I did offer her the option of repeat ablation or TEE  and cardioversion.  I do feel that she would need a TEE due to her recent embolic event.  She would prefer TEE and cardioversion.  We Eusevio Schriver also start her on flecainide.  2.  Hypertension: Elevated today but usually well controlled.  3.  Typical atrial flutter: Status post ablation 05/29/2020.   Current medicines are reviewed at length with the patient today.   The patient does not have concerns regarding her medicines.  The following changes were made today: Start flecainide 100 mg  Labs/ tests ordered today include:  Orders Placed This Encounter  Procedures  . Basic metabolic panel    Disposition:   FU with Apple Dearmas 3 months  Signed, Mikeisha Lemonds Meredith Leeds, MD  11/16/2020 8:08 AM     [facility] [address] [place] [address] [phone] (office) [phone] (fax)

## 2020-11-15 NOTE — Progress Notes (Signed)
Electrophysiology Office Note   Date:  11/16/2020   ID:  April Munoz, DOB 1951-05-08, MRN [id number]  PCP:  [name], MD  Cardiologist: Bettina Gavia Primary Electrophysiologist:  Constance Haw, MD    No chief complaint on file.    History of Present Illness: April Munoz is a 70 y.o. female who is being seen today for the evaluation of atrial fibrillation at the request of Cox, Elnita Maxwell, MD. Presenting today for electrophysiology evaluation.  She has a history sniffing for persistent atrial fibrillation, depression, diabetes, hypertension, hyperlipidemia, TIA.  She is currently on propafenone.  She wore a 30-day monitor that showed almost entirely atrial fibrillation.  She has malaise, fatigue, exercise intolerance.  She is status post AF ablation 03/21/2019.  She presented to clinic in follow-up with atrial flutter.  She is status post atrial flutter ablation 07/30/2019.  She recently had a fall, she believes.  Her husband sleeps in another room heard the fall and came to check on her, found her with a drink spilled around her.  Her eyes were open but she was not talking.  She had taken an Ambien so he did not think that this was unusual.  She saw her PCP with a CT scan of her brain that showed no acute findings.  She did develop a visual field defect in her left eye and saw ophthalmology who diagnosed her with retinal branch occlusion.  In her primary care physician's office, she was noted to be in atrial flutter.  Today, denies symptoms of palpitations, chest pain, shortness of breath, orthopnea, PND, lower extremity edema, claudication, dizziness, presyncope, syncope, bleeding, or neurologic sequela. The patient is tolerating medications without difficulties.  She complains today of being fatigued.  She does not have much in the way of shortness of breath.  She continues to have issues with vision in her left eye.   Past Medical History:  Diagnosis Date  . Anxiety   . Arthritis   .  Ataxic gait   . Atrial fibrillation ([place])   . Atrophy of thyroid   . B12 deficiency 10/31/2017  . Cellulitis of abdominal wall 07/07/2016  . Chronic anticoagulation 05/31/2018  . Depression   . Depression, major, recurrent, mild ([place]) 09/16/2019  . Diabetes mellitus without complication ([place])   . Diabetic glomerulopathy ([place]) 09/16/2019  . Esophageal stricture 06/05/2017  . Essential hypertension 02/24/2013  . Fatigue 01/20/2020  . Fibromyalgia 02/24/2013  . Full dentures   . Hereditary and idiopathic peripheral neuropathy 02/24/2013  . High risk medication use 11/07/2017  . Hyperlipidemia 02/24/2013  . Hypertension   . Hypertensive heart disease 02/24/2013  . Incontinence   . Insomnia   . Longstanding persistent atrial fibrillation ([place]) 09/16/2019  . Mild vitamin D deficiency 09/16/2019  . Mixed hyperlipidemia 02/24/2013  . Murmur, cardiac 08/15/2015  . Neuropathy in diabetes ([place]) 10/31/2017  . Osteoporosis   . Other amnesia   . Other amnesia   . 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   . Paroxysmal atrial fibrillation ([place]) 08/15/2015   CHADS2vasc=3 CHADS2vasc=3  . Primary insomnia   . Restless legs syndrome 03/14/2013  . Secondary hypothyroidism 09/16/2019  . Thoracic or lumbosacral neuritis or radiculitis 02/24/2013  . Type 2 diabetes mellitus, without long-term current use of insulin ([place]) 02/24/2013  . Urge incontinence of urine 09/16/2019  . Wears glasses    Past Surgical History:  Procedure Laterality Date  . A-FLUTTER ABLATION N/A 07/30/2019   Procedure: A-FLUTTER ABLATION;  Surgeon: Curt Bears,  Ocie Doyne, MD;  Location: [facility];  Service: Cardiovascular;  Laterality: N/A;  . ATRIAL FIBRILLATION ABLATION N/A 03/21/2019   Procedure: ATRIAL FIBRILLATION ABLATION;  Surgeon: Constance Haw, MD;  Location: [facility];  Service: Cardiovascular;  Laterality: N/A;  . BREAST REDUCTION SURGERY  1983  . CATARACT EXTRACTION, BILATERAL  2018, 2019  .  CHOLECYSTECTOMY  1971   open  . DIAGNOSTIC LAPAROSCOPY  2010   lysis of adhesions  . DILATION AND CURETTAGE OF UTERUS  2004  . KNEE ARTHROSCOPY  1998   left  . KNEE ARTHROSCOPY W/ LATERAL RETINACULAR REPAIR    . MASS EXCISION Left 11/11/2013   Procedure: EXCISION MUCOID CYST LEFT INDEX FINGER/DEBRIDEMENT LEFT INDEX FINGER;  Surgeon: Wynonia Sours, MD;  Location: [facility];  Service: Orthopedics;  Laterality: Left;  ANESTHESIA: IV REGIONAL/FAB  . SHOULDER ARTHROSCOPY W/ ROTATOR CUFF REPAIR  2007   left  . TRIGGER FINGER RELEASE Left 11/11/2013   Procedure: RELEASE A-1 PULLEY LEFT RING FINGER;  Surgeon: Wynonia Sours, MD;  Location: [facility];  Service: Orthopedics;  Laterality: Left;  . UMBILICAL HERNIA REPAIR  2008, 2010     Current Outpatient Medications  Medication Sig Dispense Refill  . acyclovir (ZOVIRAX) 800 MG tablet TAKE ONE (1) TABLET FOUR (4) TIMES DAILYAS NEEDED FOR SHINGLES 120 tablet 0  . atorvastatin (LIPITOR) 10 MG tablet Take 10 mg by mouth 4 (four) times a week.    . B Complex-C-Folic Acid TABS Take 1 tablet by mouth daily.    . Biotin 1000 MCG tablet Take 1,000 mcg by mouth daily.    . Calcium-Vitamin D-Vitamin K (VIACTIV CALCIUM PLUS D) 650-12.5-40 MG-MCG-MCG CHEW Chew 1 capsule by mouth daily.    . Continuous Blood Gluc Receiver (FREESTYLE LIBRE 2 READER) DEVI E11.21 Check blood sugar 4 times daily as directed 1 each 0  . Continuous Blood Gluc Sensor (FREESTYLE LIBRE 2 SENSOR) MISC E11.21 Change sensor every 14 days as directed 6 each 3  . cyanocobalamin (,VITAMIN B-12,) 1000 MCG/ML injection Inject 1 mL (1,000 mcg total) into the muscle every 30 (thirty) days. 1 mL 2  . Dulaglutide (TRULICITY) 0.10 XN/2.3FT SOPN Inject 0.75 mg into the skin once a week. 6 mL 0  . EPINEPHrine 0.3 mg/0.3 mL IJ SOAJ injection Inject 0.3 mg into the muscle as needed. 1 each 1  . flecainide (TAMBOCOR) 100 MG tablet Take 1 tablet (100 mg total) by mouth 2 (two)  times daily. 60 tablet 3  . levothyroxine (SYNTHROID) 88 MCG tablet Take 1 tablet (88 mcg total) by mouth daily before breakfast. 90 tablet 0  . losartan (COZAAR) 50 MG tablet Take 1 tablet (50 mg total) by mouth 2 (two) times daily. 1 tablet 0  . Melatonin 10 MG TABS Take 1 tablet by mouth at bedtime.    . metoprolol succinate (TOPROL-XL) 25 MG 24 hr tablet TAKE 1 TABLET BY MOUTH IN THE MORNING AND 1 TABLET AT BEDTIME. 60 tablet 11  . Multiple Vitamin (MULTIVITAMIN WITH MINERALS) TABS tablet Take 1 tablet by mouth daily.    Marland Kitchen omeprazole (PRILOSEC) 20 MG capsule TAKE ONE (1) CAPSULE EACH DAY 90 capsule 0  . Probiotic Product (PROBIOTIC & ACIDOPHILUS EX ST PO) Take 1 tablet by mouth daily.     . rivaroxaban (XARELTO) 20 MG TABS tablet Take 1 tablet (20 mg total) by mouth daily with supper. 30 tablet 3  . torsemide (DEMADEX) 20 MG tablet  Take 1 tablet (20 mg total) by mouth 3 (three) times a week. Please take this on Monday, Wednesday, and Friday. 90 tablet 0  . Vitamin D, Ergocalciferol, (DRISDOL) 1.25 MG (50000 UNIT) CAPS capsule TAKE 1 CAPSULE BY MOUTH EVERY MONDAY 12 capsule 1  . vortioxetine HBr (TRINTELLIX) 10 MG TABS tablet Take 10 mg by mouth daily.    . Zinc Sulfate (ZINC 15 PO) Take 1 tablet by mouth daily.    Marland Kitchen zolpidem (AMBIEN) 10 MG tablet TAKE 1 TABLET BY MOUTH AT BEDTIME AS NEEDED FOR SLEEP 30 tablet 3   No current facility-administered medications for this visit.    Allergies:   Prednisone   Social History:  The patient  reports that she has never smoked. She has never used smokeless tobacco. She reports current alcohol use of about 3.0 - 4.0 standard drinks of alcohol per week. She reports that she does not use drugs.   Family History:  The patient's family history includes Atrial fibrillation in her mother; Diabetes in her maternal grandmother; Heart attack in her brother and mother; Heart disease in her brother; Heart failure in her father and paternal grandfather; Stroke in  her paternal grandfather.   ROS:  Please see the history of present illness.   Otherwise, review of systems is positive for none.   All other systems are reviewed and negative.   PHYSICAL EXAM: VS:  BP (!) 158/72   Pulse 64   Ht 5\' 8"  (1.727 m)   Wt 191 lb 9.6 oz (86.9 kg)   SpO2 96%   BMI 29.13 kg/m  , BMI Body mass index is 29.13 kg/m. GEN: Well nourished, well developed, in no acute distress  HEENT: normal  Neck: no JVD, carotid bruits, or masses Cardiac: RRR; no murmurs, rubs, or gallops,no edema  Respiratory:  clear to auscultation bilaterally, normal work of breathing GI: soft, nontender, nondistended, + BS MS: no deformity or atrophy  Skin: warm and dry Neuro:  Strength and sensation are intact Psych: euthymic mood, full affect  EKG:  EKG is not ordered today. Personal review of the ekg ordered 11/05/20 shows atrial flutter, rate 53  Recent Labs: 01/20/2020: Magnesium 1.6 05/31/2020: TSH 5.250 09/14/2020: ALT 42; BUN 17; Creatinine, Ser 0.94; Potassium 5.2; Sodium 142 11/02/2020: Hemoglobin 13.1; Platelets 280    Lipid Panel     Component Value Date/Time   CHOL 156 09/03/2020 0953   TRIG 153 (H) 09/03/2020 0953   HDL 57 09/03/2020 0953   CHOLHDL 2.7 09/03/2020 0953   LDLCALC 73 09/03/2020 0953     Wt Readings from Last 3 Encounters:  11/15/20 191 lb 9.6 oz (86.9 kg)  11/05/20 190 lb 3.2 oz (86.3 kg)  11/02/20 190 lb (86.2 kg)      Other studies Reviewed: Additional studies/ records that were reviewed today include: Monitor 01/29/2019 personally reviewed Review of the above records today demonstrates:  1. atrial fibrillation with good heart rate control 2.  Rare ventricular ectopy  TTE 11/08/2020 Ejection fraction 55 to 60% Normal right ventricular size and function Left atrium normal in size Right atrium normal in size Aortic valve trileaflet structurally normal  Normal-appearing mitral valve  ASSESSMENT AND PLAN:  1.  Persistent atrial fibrillation:  Currently on Eliquis and propafenone.  High risk medication monitoring.  Status post ablation 03/21/2019.  CHA2DS2-VASc of 6.  Unfortunately she is in what appears to be an atypical atrial flutter today.  I did offer her the option of repeat ablation or TEE  and cardioversion.  I do feel that she would need a TEE due to her recent embolic event.  She would prefer TEE and cardioversion.  We Sharmeka Palmisano also start her on flecainide.  2.  Hypertension: Elevated today but usually well controlled.  3.  Typical atrial flutter: Status post ablation 05/29/2020.   Current medicines are reviewed at length with the patient today.   The patient does not have concerns regarding her medicines.  The following changes were made today: Start flecainide 100 mg  Labs/ tests ordered today include:  Orders Placed This Encounter  Procedures  . Basic metabolic panel    Disposition:   FU with Emmett Bracknell 3 months  Signed, Greyden Besecker Meredith Leeds, MD  11/16/2020 8:08 AM     [facility] [address] [place] [address] [phone] (office) [phone] (fax)

## 2020-11-16 ENCOUNTER — Telehealth: Payer: Self-pay

## 2020-11-16 LAB — BASIC METABOLIC PANEL
BUN/Creatinine Ratio: 13 (ref 12–28)
BUN: 14 mg/dL (ref 8–27)
CO2: 23 mmol/L (ref 20–29)
Calcium: 10.1 mg/dL (ref 8.7–10.3)
Chloride: 104 mmol/L (ref 96–106)
Creatinine, Ser: 1.08 mg/dL — ABNORMAL HIGH (ref 0.57–1.00)
Glucose: 114 mg/dL — ABNORMAL HIGH (ref 65–99)
Potassium: 4.7 mmol/L (ref 3.5–5.2)
Sodium: 143 mmol/L (ref 134–144)
eGFR: 55 mL/min/{1.73_m2} — ABNORMAL LOW (ref >59)

## 2020-11-16 NOTE — Telephone Encounter (Signed)
Pt calling stating she finished her farxiga samples and is questioning if she should stay on medication. She does not believe it has negatively effected her. She wants to know if she needs to try again or what next step is.   Pt wanted Dr to know she was with cardiology yesterday and he is going to check if there are clots in heart then see about shocking.   April Munoz, [place] 11/16/20 11:47 AM

## 2020-11-17 ENCOUNTER — Other Ambulatory Visit: Payer: Self-pay | Admitting: Family Medicine

## 2020-11-17 MED ORDER — DAPAGLIFLOZIN PROPANEDIOL 5 MG PO TABS: 5.0000 mg | ORAL_TABLET | Freq: Every day | ORAL | 0 refills | Status: DC

## 2020-11-22 ENCOUNTER — Other Ambulatory Visit: Payer: Self-pay | Admitting: Physician Assistant

## 2020-11-22 ENCOUNTER — Telehealth: Payer: Self-pay

## 2020-11-22 NOTE — Telephone Encounter (Signed)
PA submitted and approved for Ambien via covermymeds.

## 2020-11-24 ENCOUNTER — Other Ambulatory Visit (HOSPITAL_COMMUNITY)
Admission: RE | Admit: 2020-11-24 | Discharge: 2020-11-24 | Disposition: A | Discharge status: Home / Self Care | Payer: Medicare Other | Source: Ambulatory Visit | Attending: Internal Medicine | Admitting: Internal Medicine

## 2020-11-24 DIAGNOSIS — Z01812 Encounter for preprocedural laboratory examination: Secondary | ICD-10-CM | POA: Insufficient documentation

## 2020-11-24 DIAGNOSIS — Z20822 Contact with and (suspected) exposure to covid-19: Secondary | ICD-10-CM | POA: Insufficient documentation

## 2020-11-24 LAB — SARS CORONAVIRUS 2 (TAT 6-24 HRS): SARS Coronavirus 2: NEGATIVE

## 2020-11-26 ENCOUNTER — Encounter (HOSPITAL_COMMUNITY)
Admission: RE | Disposition: A | Discharge status: Home / Self Care | Payer: Self-pay | Source: Home / Self Care | Attending: Internal Medicine

## 2020-11-26 ENCOUNTER — Encounter (HOSPITAL_COMMUNITY): Payer: Self-pay | Admitting: Internal Medicine

## 2020-11-26 ENCOUNTER — Other Ambulatory Visit: Payer: Self-pay

## 2020-11-26 ENCOUNTER — Ambulatory Visit (HOSPITAL_COMMUNITY): Payer: Medicare Other | Admitting: Certified Registered Nurse Anesthetist

## 2020-11-26 ENCOUNTER — Ambulatory Visit (HOSPITAL_BASED_OUTPATIENT_CLINIC_OR_DEPARTMENT_OTHER)
Admission: RE | Admit: 2020-11-26 | Discharge: 2020-11-26 | Disposition: A | Discharge status: Home / Self Care | Payer: Medicare Other | Source: Ambulatory Visit | Attending: Internal Medicine | Admitting: Internal Medicine

## 2020-11-26 ENCOUNTER — Ambulatory Visit (HOSPITAL_COMMUNITY)
Admission: RE | Admit: 2020-11-26 | Discharge: 2020-11-26 | Disposition: A | Discharge status: Home / Self Care | Payer: Medicare Other | Attending: Internal Medicine | Admitting: Internal Medicine

## 2020-11-26 DIAGNOSIS — I351 Nonrheumatic aortic (valve) insufficiency: Secondary | ICD-10-CM | POA: Insufficient documentation

## 2020-11-26 DIAGNOSIS — I4891 Unspecified atrial fibrillation: Secondary | ICD-10-CM | POA: Diagnosis not present

## 2020-11-26 DIAGNOSIS — Z794 Long term (current) use of insulin: Secondary | ICD-10-CM | POA: Insufficient documentation

## 2020-11-26 DIAGNOSIS — Z79890 Hormone replacement therapy (postmenopausal): Secondary | ICD-10-CM | POA: Diagnosis not present

## 2020-11-26 DIAGNOSIS — I4819 Other persistent atrial fibrillation: Secondary | ICD-10-CM | POA: Insufficient documentation

## 2020-11-26 DIAGNOSIS — I10 Essential (primary) hypertension: Secondary | ICD-10-CM | POA: Diagnosis not present

## 2020-11-26 DIAGNOSIS — I119 Hypertensive heart disease without heart failure: Secondary | ICD-10-CM | POA: Diagnosis not present

## 2020-11-26 DIAGNOSIS — E559 Vitamin D deficiency, unspecified: Secondary | ICD-10-CM | POA: Diagnosis not present

## 2020-11-26 DIAGNOSIS — Z888 Allergy status to other drugs, medicaments and biological substances status: Secondary | ICD-10-CM | POA: Diagnosis not present

## 2020-11-26 DIAGNOSIS — Z7901 Long term (current) use of anticoagulants: Secondary | ICD-10-CM | POA: Diagnosis not present

## 2020-11-26 DIAGNOSIS — Z79899 Other long term (current) drug therapy: Secondary | ICD-10-CM | POA: Diagnosis not present

## 2020-11-26 DIAGNOSIS — I4892 Unspecified atrial flutter: Secondary | ICD-10-CM | POA: Diagnosis not present

## 2020-11-26 DIAGNOSIS — I484 Atypical atrial flutter: Secondary | ICD-10-CM

## 2020-11-26 DIAGNOSIS — E119 Type 2 diabetes mellitus without complications: Secondary | ICD-10-CM | POA: Diagnosis not present

## 2020-11-26 DIAGNOSIS — I083 Combined rheumatic disorders of mitral, aortic and tricuspid valves: Secondary | ICD-10-CM | POA: Diagnosis not present

## 2020-11-26 HISTORY — PX: CARDIOVERSION: SHX1299

## 2020-11-26 HISTORY — PX: BUBBLE STUDY: SHX6837

## 2020-11-26 HISTORY — PX: TEE WITHOUT CARDIOVERSION: SHX5443

## 2020-11-26 LAB — POCT I-STAT, CHEM 8
BUN: 21 mg/dL (ref 8–23)
Calcium, Ion: 1.2 mmol/L (ref 1.15–1.40)
Chloride: 107 mmol/L (ref 98–111)
Creatinine, Ser: 0.9 mg/dL (ref 0.44–1.00)
Glucose, Bld: 130 mg/dL — ABNORMAL HIGH (ref 70–99)
HCT: 38 % (ref 36.0–46.0)
Hemoglobin: 12.9 g/dL (ref 12.0–15.0)
Potassium: 4.2 mmol/L (ref 3.5–5.1)
Sodium: 141 mmol/L (ref 135–145)
TCO2: 25 mmol/L (ref 22–32)

## 2020-11-26 SURGERY — ECHOCARDIOGRAM, TRANSESOPHAGEAL
Anesthesia: Monitor Anesthesia Care

## 2020-11-26 MED ORDER — PROPOFOL 500 MG/50ML IV EMUL
INTRAVENOUS | Status: DC | PRN
  Administered 2020-11-26: 75 ug/kg/min via INTRAVENOUS

## 2020-11-26 MED ORDER — PROPOFOL 10 MG/ML IV BOLUS
INTRAVENOUS | Status: DC | PRN
  Administered 2020-11-26: 20 mg via INTRAVENOUS
  Administered 2020-11-26 (×2): 30 mg via INTRAVENOUS
  Administered 2020-11-26 (×2): 20 mg via INTRAVENOUS

## 2020-11-26 MED ORDER — LIDOCAINE 2% (20 MG/ML) 5 ML SYRINGE
INTRAMUSCULAR | Status: DC | PRN
  Administered 2020-11-26: 60 mg via INTRAVENOUS

## 2020-11-26 MED ORDER — SODIUM CHLORIDE 0.9 % IV SOLN: INTRAVENOUS | Status: DC

## 2020-11-26 NOTE — Transfer of Care (Signed)
Immediate Anesthesia Transfer of Care Note  Patient: April Munoz  Procedure(s) Performed: TRANSESOPHAGEAL ECHOCARDIOGRAM (TEE) (N/A ) CARDIOVERSION (N/A ) BUBBLE STUDY  Patient Location: PACU and Endoscopy Unit  Anesthesia Type:MAC  Level of Consciousness: awake, alert  and oriented  Airway & Oxygen Therapy: Patient Spontanous Breathing  Post-op Assessment: Report given to RN and Post -op Vital signs reviewed and stable  Post vital signs: Reviewed and stable  Last Vitals:  Vitals Value Taken Time  BP    Temp    Pulse    Resp    SpO2      Last Pain:  Vitals:   11/26/20 0926  TempSrc: Oral  PainSc: 0-No pain         Complications: No complications documented.

## 2020-11-26 NOTE — Discharge Instructions (Signed)

## 2020-11-26 NOTE — Anesthesia Postprocedure Evaluation (Signed)
Anesthesia Post Note  Patient: April Munoz  Procedure(s) Performed: TRANSESOPHAGEAL ECHOCARDIOGRAM (TEE) (N/A ) CARDIOVERSION (N/A ) BUBBLE STUDY     Patient location during evaluation: Endoscopy Anesthesia Type: MAC Level of consciousness: awake and alert Pain management: pain level controlled Vital Signs Assessment: post-procedure vital signs reviewed and stable Respiratory status: spontaneous breathing, nonlabored ventilation, respiratory function stable and patient connected to nasal cannula oxygen Cardiovascular status: stable and blood pressure returned to baseline Postop Assessment: no apparent nausea or vomiting Anesthetic complications: no   No complications documented.  Last Vitals:  Vitals:   11/26/20 1056 11/26/20 1106  BP: 139/73 140/70  Pulse: 70 70  Resp: 17 15  Temp:    SpO2: 96% 98%    Last Pain:  Vitals:   11/26/20 1106  TempSrc:   PainSc: 0-No pain                 Nusaybah Ivie S

## 2020-11-26 NOTE — Progress Notes (Signed)
  Echocardiogram Echocardiogram Transesophageal has been performed.  Fidel Levy 11/26/2020, 10:57 AM

## 2020-11-26 NOTE — CV Procedure (Signed)
TEE/CARDIOVERSION NOTE  TRANSESOPHAGEAL ECHOCARDIOGRAM (TEE):  Indictation: Atrial Flutter  Consent:   Informed consent was obtained prior to the procedure. The risks, benefits and alternatives for the procedure were discussed and the patient comprehended these risks.  Risks include, but are not limited to, cough, sore throat, vomiting, nausea, somnolence, esophageal and stomach trauma or perforation, bleeding, low blood pressure, aspiration, pneumonia, infection, trauma to the teeth and death.    Time Out: Verified patient identification, verified procedure, site/side was marked, verified correct patient position, special equipment/implants available, medications/allergies/relevent history reviewed, required imaging and test results available. Performed  Procedure:  After a procedural time-out, the patient was given propofol by anesthesia for sedation. The patient's heart rate, blood pressure, and oxygen saturation are monitored continuously during the procedure. The transesophageal probe was inserted in the esophagus and stomach without difficulty and multiple views were obtained. Agitated microbubble saline contrast was administered.  Complications:    Complications: None Patient did tolerate procedure well.  Findings:  1. LEFT VENTRICLE: The left ventricular wall thickness is normal.  The left ventricular cavity is normal in size. Wall motion is normal.  LVEF is 55-60%.  2. RIGHT VENTRICLE:  The right ventricle is normal in structure and function without any thrombus or masses.    3. LEFT ATRIUM:  The left atrium is mildly dilated in size without any thrombus or masses.  There is spontaneous echo contrast ("smoke") in the left atrium consistent with a low flow state.  4. LEFT ATRIAL APPENDAGE:  The left atrial appendage is free of any thrombus or masses. The appendage has single lobes. Pulse doppler indicates moderate flow in the appendage.  5. ATRIAL SEPTUM:  The atrial  septum appears intact and is free of thrombus and/or masses. There is a type 1R atrial septal aneursym that is occasionally mobile with valsalva. There is no evidence for interatrial shunting by color doppler and saline microbubble.  6. RIGHT ATRIUM:  The right atrium is normal in size and function without any thrombus or masses.  7. MITRAL VALVE:  The mitral valve is normal in structure and function with trivial regurgitation.  There were no vegetations or stenosis.  8. AORTIC VALVE:  The aortic valve is trileaflet, normal in structure and function with trivial regurgitation.  There were no vegetations or stenosis  9. TRICUSPID VALVE:  The tricuspid valve is normal in structure and function with trivial regurgitation.  There were no vegetations or stenosis  10.  PULMONIC VALVE:  The pulmonic valve is normal in structure and function with no regurgitation.  There were no vegetations or stenosis.   11. AORTIC ARCH, ASCENDING AND DESCENDING AORTA:  There was grade 1 Ron Parker et. Al, 1992) atherosclerosis of the distal aortic arch and proximal descending aorta.  12. PULMONARY VEINS: Anomalous pulmonary venous return was not noted.  13. PERICARDIUM: The pericardium appeared normal and non-thickened.  There is no pericardial effusion.  CARDIOVERSION:     Second Time Out: Verified patient identification, verified procedure, site/side was marked, verified correct patient position, special equipment/implants available, medications/allergies/relevent history reviewed, required imaging and test results available.  Performed  Procedure:  1. Patient placed on cardiac monitor, pulse oximetry, supplemental oxygen as necessary.  2. Sedation administered per anesthesia 3. Pacer pads placed anterior and posterior chest. 4. Cardioverted 1 time(s).  5. Cardioverted at 120J biphasic.  Complications:  Complications: None Patient did tolerate procedure well.  Impression:  1. No LAA thrombus, however,  there is LA smoke 2. Type 1R  atrial septal aneursym without PFO at rest or with provocation 3. No significant valve disease 4. LVEF 55-60% with normal wall motion 5. Successful DCCV with a single 120J biphasic shock to NSR  Recommendations:  1. Continue current antiarrhythmic medications and follow-up with Dr. Curt Bears.  Time Spent Directly with the Patient:  45 minutes   Pixie Casino, MD, [facility], [place] Director of the [facility] Lipid [facility] &  Cardiovascular [facility] Diplomate of the American Board of Clinical Lipidology Attending Cardiologist  Direct Dial: [phone]  Fax: [phone]  Website:  [email or web address] Dayveon Halley 11/26/2020, 10:44 AM

## 2020-11-26 NOTE — Anesthesia Procedure Notes (Signed)
Procedure Name: MAC Date/Time: 11/26/2020 10:15 AM Performed by: Trinna Post., CRNA Pre-anesthesia Checklist: Patient identified, Emergency Drugs available, Suction available, Patient being monitored and Timeout performed Patient Re-evaluated:Patient Re-evaluated prior to induction Oxygen Delivery Method: Nasal cannula Preoxygenation: Pre-oxygenation with 100% oxygen Induction Type: IV induction Placement Confirmation: positive ETCO2

## 2020-11-26 NOTE — Anesthesia Preprocedure Evaluation (Signed)
Anesthesia Evaluation  Patient identified by MRN, date of birth, ID band Patient awake    Reviewed: Allergy & Precautions, H&P , NPO status , Patient's Chart, lab work & pertinent test results  Airway Mallampati: II   Neck ROM: full    Dental   Pulmonary neg pulmonary ROS,    breath sounds clear to auscultation       Cardiovascular hypertension, + dysrhythmias Atrial Fibrillation  Rhythm:irregular Rate:Normal     Neuro/Psych PSYCHIATRIC DISORDERS Anxiety Depression  Neuromuscular disease    GI/Hepatic   Endo/Other  diabetes, Type 2Hypothyroidism   Renal/GU      Musculoskeletal  (+) Arthritis , Fibromyalgia -  Abdominal   Peds  Hematology   Anesthesia Other Findings   Reproductive/Obstetrics                             Anesthesia Physical Anesthesia Plan  ASA: III  Anesthesia Plan: MAC   Post-op Pain Management:    Induction: Intravenous  PONV Risk Score and Plan: 2 and Propofol infusion and Treatment may vary due to age or medical condition  Airway Management Planned: Simple Face Mask  Additional Equipment:   Intra-op Plan:   Post-operative Plan:   Informed Consent: I have reviewed the patients History and Physical, chart, labs and discussed the procedure including the risks, benefits and alternatives for the proposed anesthesia with the patient or authorized representative who has indicated his/her understanding and acceptance.     Dental advisory given  Plan Discussed with: CRNA, Anesthesiologist and Surgeon  Anesthesia Plan Comments:         Anesthesia Quick Evaluation

## 2020-11-26 NOTE — Interval H&P Note (Signed)
History and Physical Interval Note:  11/26/2020 9:33 AM  April Munoz  has presented today for surgery, with the diagnosis of AFIB.  The various methods of treatment have been discussed with the patient and family. After consideration of risks, benefits and other options for treatment, the patient has consented to  Procedure(s): TRANSESOPHAGEAL ECHOCARDIOGRAM (TEE) (N/A) CARDIOVERSION (N/A) as a surgical intervention.  The patient's history has been reviewed, patient examined, no change in status, stable for surgery.  I have reviewed the patient's chart and labs.  Questions were answered to the patient's satisfaction.     Pixie Casino

## 2020-11-28 ENCOUNTER — Encounter (HOSPITAL_COMMUNITY): Payer: Self-pay | Admitting: Internal Medicine

## 2020-12-02 ENCOUNTER — Ambulatory Visit (INDEPENDENT_AMBULATORY_CARE_PROVIDER_SITE_OTHER): Payer: Medicare Other

## 2020-12-02 ENCOUNTER — Other Ambulatory Visit: Payer: Self-pay

## 2020-12-02 DIAGNOSIS — I4811 Longstanding persistent atrial fibrillation: Secondary | ICD-10-CM

## 2020-12-02 DIAGNOSIS — E1121 Type 2 diabetes mellitus with diabetic nephropathy: Secondary | ICD-10-CM | POA: Diagnosis not present

## 2020-12-02 DIAGNOSIS — F331 Major depressive disorder, recurrent, moderate: Secondary | ICD-10-CM

## 2020-12-02 DIAGNOSIS — F330 Major depressive disorder, recurrent, mild: Secondary | ICD-10-CM

## 2020-12-02 NOTE — Patient Instructions (Signed)
Visit Information  Goals Addressed   None    Patient Care Plan: CCM Pharmacy Care Plan    Problem Identified: afib, diabetes, hypertension, hyperlipidemia   Priority: High  Onset Date: 08/30/2020    Long-Range Goal: Disease Management   Start Date: 08/30/2020  Expected End Date: 08/30/2021  This Visit's Progress: On track  Priority: High  Note:      Current Barriers:  . Unable to independently afford treatment regimen . Unable to maintain control of diabetes  Pharmacist Clinical Goal(s):  Marland Kitchen Over the next 90 days, patient will verbalize ability to afford treatment regimen . achieve control of diabetes as evidenced by a1c through collaboration with PharmD and provider.   Interventions: . 1:1 collaboration with [name], MD regarding development and update of comprehensive plan of care as evidenced by provider attestation and co-signature . Inter-disciplinary care team collaboration (see longitudinal plan of care) . Comprehensive medication review performed; medication list updated in electronic medical record  Hypertension (BP goal <130/80) -controlled -Current treatment: . Losartan 50 mg bid  . Metoprolol XL 25 mg bid . Torsemide 20 mg three times weekly  -Medications previously tried: none reported  -Current home readings: 140/80 mmHg -Current dietary habits: reports eating during the night  -Current exercise habits: none reported  -Denies hypotensive/hypertensive symptoms -Educated on BP goals and benefits of medications for prevention of heart attack, stroke and kidney damage; Daily salt intake goal < 2300 mg; Exercise goal of 150 minutes per week; Importance of home blood pressure monitoring; Proper BP monitoring technique; -Counseled to monitor BP at home weekly, document, and provide log at future appointments -Counseled on diet and exercise extensively Recommended to continue current medication   Hyperlipidemia: (LDL goal < 70) -uncontrolled -Current  treatment: . Atorvastatin 10 mg daily  -Medications previously tried: pravastatin, Lovaza -Current dietary patterns: reports eating at night. Loves eggs.  -Current exercise habits: none reported -Educated on Cholesterol goals;  Benefits of statin for ASCVD risk reduction; Importance of limiting foods high in cholesterol; Exercise goal of 150 minutes per week; -Counseled on diet and exercise extensively Recommended to continue current medication Recommended considering increase in atorvastatin if LDL not improved at next blood work.   Diabetes (A1c goal <7%) -uncontrolled -Current medications: . metformin 1000 mg bid  -Medications previously tried: none reported  -Current home glucose readings . fasting glucose: 120-140s . post prandial glucose: 180 -Denies hypoglycemic/hyperglycemic symptoms -Current meal patterns:  . Patient reports eating during the night.  -Current exercise: none reported -Educated onA1c and blood sugar goals; Complications of diabetes including kidney damage, retinal damage, and cardiovascular disease; Benefits of weight loss; Benefits of routine self-monitoring of blood sugar; -Counseled to check feet daily and get yearly eye exams -Counseled on diet and exercise extensively Recommended considering low dose Trulicity 0.53 mg weekly if Dr. Tobie Poet approves. Patient aware that would slow gastric emptying but states that she needs food transit to slow down after numerous stomach surgeries.   Atrial Fibrillation (Goal: prevent stroke and major bleeding) -controlled  -CHADSVASC: 4  -Current treatment: . Rate control: metoprolol xl 25 mg bid . Anticoagulation: Eliquis 5 mg bid  -Medications previously tried: none reported -Home BP and HR readings: none reported  -Counseled on increased risk of stroke due to Afib and benefits of anticoagulation for stroke prevention; importance of adherence to anticoagulant exactly as prescribed; avoidance of NSAIDs due to  increased bleeding risk with anticoagulants; -Counseled on diet and exercise extensively Recommended to continue current medication Assessed patient  finances. to consider application for Eliquis through patient assistance.   Osteoporosis / Osteopenia (Goal  Minimize risk of break or fracture) -controlled -Last DEXA Scan: not available in chart    -Current treatment  . calcium/vitamin D/vitamin k chew daily  . Vitamin D 50,000 units weekly on Monday  -Medications previously tried: none reported  -Recommend [phone] units of vitamin D daily. Recommend 1200 mg of calcium daily from dietary and supplemental sources. Recommend weight-bearing and muscle strengthening exercises for building and maintaining bone density. -Counseled on diet and exercise extensively Recommended to continue current medication  Depression (Goal: manage symptoms of depression) -controlled -Current treatment  . Venlafaxine XR 150 mg daily with brekfast -Medications previously tried: states she has tried other medications in the past but has been on venlafaxine of and on for 30 years  -Recommended to continue current medication   Insomnia (Goal: improve sleep) -controlled -Current treatment  . Zolpidem 10 mg at bedtime prn sleep  . Melatonin 10 mg daily  -Medications previously tried: none reported  -Counseled on good sleep hygeine.   Health Maintenance -Vaccine gaps: Shingles and Tetanus -Current therapy:  . tretinoin 0.05% at bedtime prn  . Multivitamin daily  . Probiotic daily  . Zinc Sulfate daily  . Epipen prn  . Vitamin B-12 1000 mcg/ml every 30 days . Biotin 1000 mcg daily  . B complex-C-folic acid daily  -Educated on Cost vs benefit of each product must be carefully weighed by individual consumer -Patient is satisfied with current therapy and denies issues -Recommended to continue current medication   Patient Goals/Self-Care Activities . Over the next 90 days, patient will:  - take  medications as prescribed focus on medication adherence by using pill box check glucose daily , document, and provide at future appointments check blood pressure weekly, document, and provide at future appointments  Follow Up Plan: Telephone follow up appointment with care management team member scheduled for: 12/02/2020      The patient verbalized understanding of instructions, educational materials, and care plan provided today and declined offer to receive copy of patient instructions, educational materials, and care plan.  Telephone follow up appointment with pharmacy team member scheduled for: 12/2020    Burnice Logan, [facility] Vortioxetine oral tablet What is this medicine? Vortioxetine (vor tee [employer] e teen) is used to treat depression. This medicine may be used for other purposes; ask your health care provider or pharmacist if you have questions. COMMON BRAND NAME(S): BRINTELLIX, Trintellix What should I tell my health care provider before I take this medicine? They need to know if you have any of these conditions:  bipolar disorder or a family history of bipolar disorder  bleeding disorder  glaucoma  liver disease  low levels of sodium in the blood  seizures  suicidal thoughts, plans, or attempt; a previous suicide attempt by you or a family member  take medicines that treat or prevent blood clots  taken an MAOI such as Marplan, Nardil, or Parnate in the last 14 days  an unusual or allergic reaction to vortioxetine, other medicines, foods, dyes, or preservatives  pregnant or trying to get pregnant  breast-feeding How should I use this medicine? Take this medicine by mouth with water. Take it as directed on the prescription label at the same time every day. You can take it with or without food. If it upsets your stomach, take it with food. Keep taking this medicine unless your health care provider tells you to stop. Stopping it too  quickly can cause serious side effects.  It can also make your condition worse. A special MedGuide will be given to you by the pharmacist with each prescription and refill. Be sure to read this information carefully each time. Talk to your pediatrician regarding the use of this medicine in children. It is not approved for use in children. Overdosage: If you think you have taken too much of this medicine contact a poison control center or emergency room at once. NOTE: This medicine is only for you. Do not share this medicine with others. What if I miss a dose? If you miss a dose, take it as soon as you can. If it is almost time for your next dose, take only that dose. Do not take double or extra doses. What may interact with this medicine? Do not take this medicine with any of the following medications:  linezolid  MAOIs like Carbex, Eldepryl, Marplan, Nardil, and Parnate  methylene blue (injected into a vein) This medicine may also interact with the following medications:  aspirin and aspirin-like medicines  carbamazepine  certain medicines for depression, anxiety, or psychotic disturbances  certain medicines for migraine headache like almotriptan, eletriptan, frovatriptan, naratriptan, rizatriptan, sumatriptan, zolmitriptan  diuretics  fentanyl  furazolidone  isoniazid  lithium  medicines that treat or prevent blood clots like warfarin, enoxaparin, and dalteparin  NSAIDs, medicines for pain and inflammation, like ibuprofen or naproxen  phenytoin  procarbazine  quinidine  rasagiline  rifampin  safinamide  supplements like St. John's wort, kava kava, valerian  tramadol  tryptophan This list may not describe all possible interactions. Give your health care provider a list of all the medicines, herbs, non-prescription drugs, or dietary supplements you use. Also tell them if you smoke, drink alcohol, or use illegal drugs. Some items may interact with your medicine. What should I watch for while using  this medicine? Visit your health care professional for regular checks on your progress. Tell your health care professional if your symptoms do not start to get better or if they get worse. Patients and their families should watch out for new or worsening depression or thoughts of suicide. Also watch out for sudden changes in feelings such as feeling anxious, agitated, panicky, irritable, hostile, aggressive, impulsive, severely restless, overly excited and hyperactive, or not being able to sleep. If this happens, especially at the beginning of treatment or after a change in dose, call your healthcare professional. Dennis Bast may get drowsy or dizzy. Do not drive, use machinery, or do anything that needs mental alertness until you know how this medicine affects you. Do not stand or sit up quickly, especially if you are an older patient. This reduces the risk of dizzy or fainting spells. What side effects may I notice from receiving this medicine? Side effects that you should report to your doctor or health care professional as soon as possible:  allergic reactions (skin rash, itching or hives, swelling of the face, lips, or tongue)  bleeding (bloody or black, tarry stools; red or dark Obi Scrima urine; spitting up blood or Derrisha Foos material that looks like coffee grounds; red spots on the skin; unusual bruising or bleeding from the eyes, gums, or nose  changes in vision  elevated mood, decreased need for sleep, racing thoughts, impulsive behavior  eye pain  hallucinations  low sodium levels (headache; difficulty concentrating, confusion; feeling faint or lightheaded, falls; weakness; seizures)  serotonin syndrome (irritable; confusion; diarrhea; fast or irregular heartbeat; muscle twitching; stiff muscles; trouble walking; sweating; high fever;  seizures; chills; vomiting)  suicidal thoughts or other mood changes Side effects that usually do not require medical attention (report to your doctor or health care  professional if they continue or are bothersome):  change in sex drive or performance  constipation  dizziness  dry mouth  nausea This list may not describe all possible side effects. Call your doctor for medical advice about side effects. You may report side effects to FDA at 1-800-FDA-1088. Where should I keep my medicine? Keep out of the reach of children and pets. Store at room temperature between 20 and 25 degrees C (68 and 77 degrees F). Throw away any unused medicine after the expiration date. NOTE: This sheet is a summary. It may not cover all possible information. If you have questions about this medicine, talk to your doctor, pharmacist, or health care provider.  2021 [employer]/Gold Standard (2019-10-07 09:38:54)

## 2020-12-02 NOTE — Progress Notes (Signed)
Chronic Care Management Pharmacy Note  12/02/2020 Name:  April Munoz MRN:  [id number] DOB:  Dec 22, 1950   Plan Recommendations:   Recommend increasing Farxiga to 10 mg daily. Provided patient samples and coordinating patient assistance applications.   Printed updated Patient Med List.   Recommend checking updated TSH with next chronic labs.   Recommend increasing Trintellix 20 mg daily due to symptoms not well controlled and patient takes second dose of 10 mg on especially difficult days. Counseled patient that 20 mg daily is max dose. Coordinated updated script with patient assistance program.   Subjective: April Munoz is an 70 y.o. year old female who is a primary patient of Cox, Kirsten, MD.  The CCM team was consulted for assistance with disease management and care coordination needs.    Engaged with patient face to face for initial visit in response to provider referral for pharmacy case management and/or care coordination services.   Consent to Services:  The patient was given the following information about Chronic Care Management services today, agreed to services, and gave verbal consent: 1. CCM service includes personalized support from designated clinical staff supervised by the primary care provider, including individualized plan of care and coordination with other care providers 2. 24/7 contact phone numbers for assistance for urgent and routine care needs. 3. Service will only be billed when office clinical staff spend 20 minutes or more in a month to coordinate care. 4. Only one practitioner may furnish and bill the service in a calendar month. 5.The patient may stop CCM services at any time (effective at the end of the month) by phone call to the office staff. 6. The patient will be responsible for cost sharing (co-pay) of up to 20% of the service fee (after annual deductible is met). Patient agreed to services and consent obtained.  Patient Care Team: [name], MD as PCP  - General (Family Medicine) Richardo Priest, MD as PCP - Cardiology (Cardiology) Constance Haw, MD as PCP - Electrophysiology (Cardiology) Richardo Priest, MD as Consulting Physician (Cardiology) Misenheimer, Christia Reading, MD as Consulting Physician (Unknown Physician Specialty) Pollyann Samples, MD as Consulting Physician (General Surgery) Burnice Logan, [facility] as Pharmacist (Pharmacist)  Recent office visits: 11/02/2020 - ordered ekg, echo, carotid ultrasound and labs.  10/01/2020 - add Farxiga 5 mg daily. Continue with Trulicity. Follow-up with psychiatry.  05/27/2020 - increase zolpidem 10 mg once daily. Work on [employer] and exercise. Lab results: Sugar elevated. TSH abnormal - increase Synthroid 88 mcg daily in am. Continue vitamin D supplement. LDL elevated and TG elevated. LFT and Kidney function normal. No evidence of B12 deficiency.   Recent consult visits: 11/15/2020 - cardio - cha2ds2-vasc 6. Start flecanide 100 mg.  11/05/2020 - stop Eliquis and start Xarelto 20 mg.   08/05/2020 - Cardio - continue beta-blocker and anticoagulation. Order CT scan and PFTs.  06/02/2020 - GI - continue PPI. Fiber daily and miralax prn for constipation.  05/26/2020 - pulmonology - labs ordered. Schedule CT in 3 months.  04/26/2020 - Cardio - take diuretic 3 days a week. Refer to pulmonary if atypical pulmonary infection. Continue anticoagulant. Resume statin since previous TIA and hyperlipidemia.  03/09/2020 - nephrology - manage blood pressure. Low sodium diet. Continue ACE/ARB.    Hospital visits:   Objective:  Lab Results  Component Value Date   CREATININE 0.90 11/26/2020   BUN 21 11/26/2020   GFRNONAA 53 (L) 09/03/2020   GFRAA 61 09/03/2020   NA 141 11/26/2020  K 4.2 11/26/2020   CALCIUM 10.1 11/15/2020   CO2 23 11/15/2020    Lab Results  Component Value Date/Time   HGBA1C 7.1 (H) 09/03/2020 09:53 AM   HGBA1C 7.2 (H) 01/26/2020 09:54 AM   MICROALBUR 150 05/27/2020 09:39 AM    MICROALBUR 150 09/18/2019 01:25 PM    Last diabetic Eye exam: No results found for: HMDIABEYEEXA  Last diabetic Foot exam: No results found for: HMDIABFOOTEX   Lab Results  Component Value Date   CHOL 156 09/03/2020   HDL 57 09/03/2020   LDLCALC 73 09/03/2020   TRIG 153 (H) 09/03/2020   CHOLHDL 2.7 09/03/2020    Hepatic Function Latest Ref Rng & Units 09/14/2020 09/03/2020 05/31/2020  Total Protein 6.0 - 8.5 g/dL 6.0 6.6 6.4  Albumin 3.7 - 4.7 g/dL 3.8 4.1 4.1  AST 0 - 40 IU/L '24 22 17  ' ALT 0 - 32 IU/L 42(H) 25 29  Alk Phosphatase 44 - 121 IU/L 115 76 68  Total Bilirubin 0.0 - 1.2 mg/dL 0.4 0.5 0.3    Lab Results  Component Value Date/Time   TSH 5.250 (H) 05/31/2020 09:18 AM   TSH 3.240 02/26/2020 08:16 AM    CBC Latest Ref Rng & Units 11/26/2020 11/02/2020 09/14/2020  WBC 3.4 - 10.8 x10E3/uL - 6.9 8.8  Hemoglobin 12.0 - 15.0 g/dL 12.9 13.1 9.9(L)  Hematocrit 36.0 - 46.0 % 38.0 40.1 32.5(L)  Platelets 150 - 450 x10E3/uL - 280 427    Lab Results  Component Value Date/Time   VD25OH 49.8 09/03/2020 09:53 AM   VD25OH 34.4 05/31/2020 09:18 AM    Clinical ASCVD: No  The 10-year ASCVD risk score Mikey Bussing DC Jr., et al., 2013) is: 27.8%   Values used to calculate the score:     Age: 41 years     Sex: Female     Is Non-Hispanic African American: No     Diabetic: Yes     Tobacco smoker: No     Systolic Blood Pressure: 062 mmHg     Is BP treated: Yes     HDL Cholesterol: 57 mg/dL     Total Cholesterol: 156 mg/dL    Depression screen [facility] 2/9 12/02/2020 10/02/2020 09/03/2020  Decreased Interest '3 3 3  ' Down, Depressed, Hopeless '3 3 2  ' PHQ - 2 Score '6 6 5  ' Altered sleeping '3 3 3  ' Tired, decreased energy '3 3 2  ' Change in appetite '1 1 1  ' Feeling bad or failure about yourself  0 1 1  Trouble concentrating '3 2 2  ' Moving slowly or fidgety/restless '2 2 1  ' Suicidal thoughts 0 0 0  PHQ-9 Score '18 18 15  ' Difficult doing work/chores - Very difficult Very difficult  Some recent data  might be hidden      Social History   Tobacco Use  Smoking Status Never Smoker  Smokeless Tobacco Never Used   BP Readings from Last 3 Encounters:  11/26/20 140/70  11/15/20 (!) 158/72  11/05/20 (!) 160/90   Pulse Readings from Last 3 Encounters:  11/26/20 70  11/15/20 64  11/05/20 (!) 53   Wt Readings from Last 3 Encounters:  11/26/20 190 lb (86.2 kg)  11/15/20 191 lb 9.6 oz (86.9 kg)  11/05/20 190 lb 3.2 oz (86.3 kg)    Assessment/Interventions: Review of patient past medical history, allergies, medications, health status, including review of consultants reports, laboratory and other test data, was performed as part of comprehensive evaluation and provision of chronic care management  services.   SDOH:  (Social Determinants of Health) assessments and interventions performed: Yes   CCM Care Plan  Allergies  Allergen Reactions  . Prednisone     In high doses causes her to feel crazy    Medications Reviewed Today    Reviewed by Burnice Logan, [facility] (Pharmacist) on 12/02/20 at 1403  Med List Status: <None>  Medication Order Taking? Sig Documenting Provider Last Dose Status Informant  acyclovir (ZOVIRAX) 800 MG tablet [id number] Yes TAKE ONE (1) TABLET FOUR (4) TIMES DAILYAS NEEDED FOR SHINGLES  Patient taking differently: Take 800 mg by mouth daily as needed (Shingles).   Cox, Kirsten, MD Taking Active Self  atorvastatin (LIPITOR) 10 MG tablet [id number] Yes Take 10 mg by mouth daily. [provider] Taking Active Self  B Complex-C-Folic Acid TABS [id number] Yes Take 1 tablet by mouth daily. [provider] Taking Active Self  Calcium Carb-Cholecalciferol (LIQUID CALCIUM WITH D3) [phone] MG-UNIT CAPS [id number] Yes Take 600 mg by mouth daily. [provider] Taking Active Self  Calcium-Vitamin D-Vitamin K (VIACTIV CALCIUM PLUS D) 650-12.5-40 MG-MCG-MCG CHEW [id number] No Chew 1 capsule by mouth daily.  Patient not taking: Reported on 12/02/2020    [provider] Not Taking Active Self  Coenzyme Q10 100 MG TABS [id number] Yes Take 1 tablet by mouth daily. [provider] Taking Active Self  Continuous Blood Gluc Receiver (FREESTYLE LIBRE 2 READER) DEVI [id number] Yes E11.21 Check blood sugar 4 times daily as directed Cox, Kirsten, MD Taking Active Self  Continuous Blood Gluc Sensor (FREESTYLE LIBRE 2 SENSOR) [place] [id number] Yes E11.21 Change sensor every 14 days as directed Cox, Elnita Maxwell, MD Taking Active Self  cyanocobalamin (,VITAMIN B-12,) 1000 MCG/ML injection [id number] Yes Inject 1 mL (1,000 mcg total) into the muscle every 30 (thirty) days. Cox, Kirsten, MD Taking Active Self  dapagliflozin propanediol (FARXIGA) 10 MG TABS tablet [id number] Yes Take 10 mg by mouth daily. [provider] Taking Active   Dulaglutide (TRULICITY) 6.38 LH/7.3SK SOPN [id number] Yes Inject 0.75 mg into the skin once a week. Cox, Kirsten, MD Taking Active Self  EPINEPHrine 0.3 mg/0.3 mL IJ SOAJ injection [id number] Yes Inject 0.3 mg into the muscle as needed. [name], MD Taking Active Self           Med Note Lenor Derrick Nov 22, 2020 10:20 AM) Pharmacy is out at this time  flecainide (TAMBOCOR) 100 MG tablet [id number] Yes Take 1 tablet (100 mg total) by mouth 2 (two) times daily. Constance Haw, MD Taking Active Self  levothyroxine (SYNTHROID) 75 MCG tablet [id number] Yes Take 75 mcg by mouth daily before breakfast. [provider] Taking Active   levothyroxine (SYNTHROID) 88 MCG tablet [id number] No Take 1 tablet (88 mcg total) by mouth daily before breakfast.  Patient not taking: Reported on 12/02/2020   CoxElnita Maxwell, MD Not Taking Consider Medication Status and Discontinue   losartan (COZAAR) 50 MG tablet [id number] Yes Take 1 tablet (50 mg total) by mouth 2 (two) times daily.  Patient taking differently: Take 50 mg by mouth daily.   [name], MD Taking Active Self           Med Note Owens Shark, [place] B   Thu  Dec 02, 2020  9:57 AM) Once daily  Lutein 20 MG TABS [id number] Yes Take 1 tablet by mouth daily. [provider] Taking Active   Melatonin 10 MG TABS [id number] No Take 10 mg by mouth 4 (four) times a  week.  Patient not taking: Reported on 12/02/2020   [provider] Not Taking Consider Medication Status and Discontinue Self  metFORMIN (GLUCOPHAGE) 1000 MG tablet [id number] Yes Take 1,000 mg by mouth daily as needed (high blood glucoce). [provider] Taking Active Self  metoprolol succinate (TOPROL-XL) 25 MG 24 hr tablet [id number] Yes TAKE 1 TABLET BY MOUTH IN THE MORNING AND 1 TABLET AT BEDTIME.  Patient taking differently: Take 25 mg by mouth 2 (two) times daily.   Richardo Priest, MD Taking Active   Multiple Vitamin (MULTIVITAMIN WITH MINERALS) TABS tablet [id number] Yes Take 1 tablet by mouth daily. [provider] Taking Active Self  omega-3 acid ethyl esters (LOVAZA) 1 g capsule [id number] Yes Take 2 capsules by mouth 2 (two) times daily. [provider] Taking Active Self  omeprazole (PRILOSEC) 20 MG capsule [id number] Yes TAKE ONE (1) CAPSULE EACH DAY Marge Duncans, PA-C Taking Active   Probiotic Product (PROBIOTIC & ACIDOPHILUS EX ST PO) [id number] Yes Take 1 tablet by mouth daily.  [provider] Taking Active Self           Med Note Stevan Born   Tue May 27, 2019  3:15 PM)    rivaroxaban (XARELTO) 20 MG TABS tablet [id number] Yes Take 1 tablet (20 mg total) by mouth daily with supper. Sherran Needs, NP Taking Active Self  torsemide (DEMADEX) 20 MG tablet [id number] Yes Take 1 tablet (20 mg total) by mouth 3 (three) times a week. Please take this on Monday, Wednesday, and Friday.  Patient taking differently: Take 20 mg by mouth every Monday, Wednesday, and Friday.   Richardo Priest, MD Taking Active   Vitamin D, Ergocalciferol, (DRISDOL) 1.25 MG (50000 UNIT) CAPS capsule [id number] Yes TAKE 1 CAPSULE BY MOUTH EVERY MONDAY  Patient  taking differently: Take 50,000 Units by mouth every 7 (seven) days.   Marge Duncans, PA-C Taking Active   vortioxetine HBr (TRINTELLIX) 20 MG TABS tablet [id number] Yes Take 20 mg by mouth daily. [provider] Taking Active Self           Med Note Owens Shark, SARA B   Thu Dec 02, 2020  2:00 PM) Recommended dose increase to 20 mg daily. Approved by Dr. Tobie Poet.   Zinc Sulfate (ZINC 15 PO) [id number] Yes Take 150 mg by mouth 3 (three) times a week. [provider] Taking Active Self  zolpidem (AMBIEN) 10 MG tablet [id number] Yes TAKE 1 TABLET BY MOUTH AT BEDTIME AS NEEDED FOR SLEEP  Patient taking differently: Take 10 mg by mouth at bedtime as needed for sleep.   CoxElnita Maxwell, MD Taking Active           Patient Active Problem List   Diagnosis Date Noted  . Atypical atrial flutter ([place])   . Hypertension   . Diabetes mellitus without complication ([place])   . Depression   . Atrophy of thyroid   . Atrial fibrillation ([place])   . Anxiety   . Arthritis   . Ataxic gait   . Full dentures   . Incontinence   . Insomnia   . Other amnesia   . 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   . Primary insomnia   . Wears glasses   . Fatigue 01/20/2020  . Diabetic glomerulopathy ([place]) 09/16/2019  . Urge incontinence of urine 09/16/2019  . Secondary hypothyroidism 09/16/2019  . Longstanding persistent atrial fibrillation ([place]) 09/16/2019  . Depression, major, recurrent, mild ([place]) 09/16/2019  .  Mild vitamin D deficiency 09/16/2019  . Chronic anticoagulation 05/31/2018  . High risk medication use 11/07/2017  . B12 deficiency 10/31/2017  . Neuropathy in diabetes ([place]) 10/31/2017  . Esophageal stricture 06/05/2017  . Cellulitis of abdominal wall 07/07/2016  . Murmur, cardiac 08/15/2015  . Paroxysmal atrial fibrillation ([place]) 08/15/2015  . Restless legs syndrome 03/14/2013  . Hypertensive heart disease 02/24/2013  . Fibromyalgia 02/24/2013  . Hereditary and idiopathic  peripheral neuropathy 02/24/2013  . Mixed hyperlipidemia 02/24/2013  . Osteoporosis 02/24/2013  . Thoracic or lumbosacral neuritis or radiculitis 02/24/2013  . Essential hypertension 02/24/2013  . Hyperlipidemia 02/24/2013  . Type 2 diabetes mellitus, without long-term current use of insulin ([place]) 02/24/2013    Immunization History  Administered Date(s) Administered  . Fluad Quad(high Dose 65+) 03/18/2019, 04/25/2020  . Influenza-Unspecified 03/18/2019, 05/14/2020  . Moderna Sars-Covid-2 Vaccination 09/09/2019, 10/04/2019, 04/19/2020  . Pneumococcal Conjugate-13 05/14/2015  . Pneumococcal Polysaccharide-23 05/16/2013, 07/27/2017    Conditions to be addressed/monitored:  Hypertension, Hyperlipidemia, Diabetes, Hypothyroidism, Depression, Anxiety, Osteoporosis and Insomnia.   Care Plan : [place]  Updates made by Burnice Logan, [place] since 12/02/2020 12:00 AM    Problem: afib, diabetes, hypertension, hyperlipidemia   Priority: High  Onset Date: 08/30/2020    Long-Range Goal: Disease Management   Start Date: 08/30/2020  Expected End Date: 08/30/2021  Recent Progress: On track  Priority: High  Note:    Current Barriers:  . Unable to independently afford treatment regimen . Unable to maintain control of diabetes  Pharmacist Clinical Goal(s):  Marland Kitchen Over the next 90 days, patient will verbalize ability to afford treatment regimen . achieve control of diabetes as evidenced by a1c through collaboration with PharmD and provider.   Interventions: . 1:1 collaboration with [name], MD regarding development and update of comprehensive plan of care as evidenced by provider attestation and co-signature . Inter-disciplinary care team collaboration (see longitudinal plan of care) . Comprehensive medication review performed; medication list updated in electronic medical record  Hypertension (BP goal <130/80) -controlled -Current treatment: . Losartan 50 mg  daily . Metoprolol XL 25 mg bid . Torsemide 20 mg three times weekly  -Medications previously tried: none reported  -Current home readings: elevated since cardioversion last week today 160-170/90  MmHg. Patient calling cardiologist due to continuing to feel palpitations and elevated bp.  -Current dietary habits: reports eating during the night some.  -Current exercise habits: none reported  -Denies hypotensive/hypertensive symptoms -Educated on BP goals and benefits of medications for prevention of heart attack, stroke and kidney damage; Daily salt intake goal < 2300 mg; Exercise goal of 150 minutes per week; Importance of home blood pressure monitoring; Proper BP monitoring technique; -Counseled to monitor BP at home daily, document, and provide log at future appointments -Counseled on diet and exercise extensively Recommended to continue current medication   Hyperlipidemia: (LDL goal < 55) -not ideally controlled -Current treatment: . Atorvastatin 10 mg daily  -Medications previously tried: pravastatin, Lovaza -Current dietary patterns: reports eating at night. Loves eggs.  -Current exercise habits: none reported -Educated on Cholesterol goals;  Benefits of statin for ASCVD risk reduction; Importance of limiting foods high in cholesterol; Exercise goal of 150 minutes per week; -Counseled on diet and exercise extensively Recommended to continue current medication Recommended considering increase in atorvastatin if LDL not improved at next blood work.   Diabetes (A1c goal <7%) -uncontrolled -Current medications: . metformin 1000 mg daily prn (patient takes if blood sugar elevated) . Farxiga 5 mg  daily  . Trulicity 2.64 mg weekly   -Medications previously tried: none reported  -Current home glucose readings . fasting glucose: 161 . post prandial glucose: 265 -Denies hypoglycemic/hyperglycemic symptoms -Current meal patterns:  . Patient reports eating during the night.   . Not eating well since cardioversion.  -Current exercise: none reported -Educated onA1c and blood sugar goals; Complications of diabetes including kidney damage, retinal damage, and cardiovascular disease; Benefits of weight loss; Benefits of routine self-monitoring of blood sugar; -Counseled to check feet daily and get yearly eye exams -Counseled on diet and exercise extensively Recommended increasing Farxiga 10 mg daily. Begin taking metformin 1000 mg daily. Will consider increase in Trulicity at next visit if blood sugar remains above goal. Patient follows-up with GI in  June and has a history of stomach issues. Choosing to increase Farxiga first to avoid GI adverse events.   Atrial Fibrillation (Goal: prevent stroke and major bleeding) -controlled  -CHADSVASC: 6  -Current treatment: . Rate control: metoprolol xl 25 mg bid, flecanide 100 mg bid . Anticoagulation: Xarelto 20 mg daily  -Medications previously tried: Eliquis  -Home BP and HR readings: none reported  -Counseled on increased risk of stroke due to Afib and benefits of anticoagulation for stroke prevention; importance of adherence to anticoagulant exactly as prescribed; avoidance of NSAIDs due to increased bleeding risk with anticoagulants; -Counseled on diet and exercise extensively Recommended to continue current medication Patient may be eligible for Xarelto patient assistance once 4% of household income has been spent on medications for 2022.   Osteoporosis / Osteopenia (Goal  Minimize risk of break or fracture) -controlled -Last DEXA Scan: not available in chart    -Current treatment  . calcium/vitamin D/vitamin k chew daily  . Vitamin D 50,000 units weekly on Monday  -Medications previously tried: none reported  -Recommend [phone] units of vitamin D daily. Recommend 1200 mg of calcium daily from dietary and supplemental sources. Recommend weight-bearing and muscle strengthening exercises for building and  maintaining bone density. -Counseled on diet and exercise extensively Recommended to continue current medication  Depression (Goal: manage symptoms of depression) -not ideally controlled -Current treatment  . Trintellix 10 mg daily  -Medications previously tried: states she has tried other medications in the past but has been on venlafaxine of and on for 30 years, venlafaxine -Recommended increase to Trintellix 20 mg daily. Patient has been taking second dose of 10 mg on days she feels bad. Pharmacist consulted with Dr. Tobie Poet who approved this change.   Insomnia (Goal: improve sleep) -controlled -Current treatment  . Zolpidem 10 mg at bedtime prn sleep  -Medications previously tried: melatonin, mirtazapine -Counseled on good sleep hygeine.   Health Maintenance -Vaccine gaps: Shingles and Tetanus -Current therapy:  . Multivitamin daily  . Probiotic daily  . Zinc Sulfate daily  . Epipen prn  . Vitamin B-12 1000 mcg/ml every 30 days . Biotin 1000 mcg daily  . B complex-C-folic acid daily  . Lutein 20 mg daily  -Educated on Cost vs benefit of each product must be carefully weighed by individual consumer -Patient is satisfied with current therapy and denies issues -Recommended to continue current medication   Patient Goals/Self-Care Activities . Over the next 90 days, patient will:  - take medications as prescribed focus on medication adherence by using pill box check glucose daily , document, and provide at future appointments check blood pressure weekly, document, and provide at future appointments  Follow Up Plan: Telephone follow up appointment with care management team member  scheduled for: 12/2020      Medication Assistance: Application for Trulicity and Farxiga  medication assistance program. in process.  Anticipated assistance start date 12/29/2020.  See plan of care for additional detail. Patient currently approved for Trintellix through 07/16/2021.   Patient's  preferred pharmacy is:  [facility], [place] - [place] [address] Phone: [phone] Fax: [place], [place] [facility] [address] [place] [address] Phone: [phone] Fax: [phone]  Uses pill box? Yes Pt endorses good compliance  We discussed: Benefits of medication synchronization, packaging and delivery as well as enhanced pharmacist oversight with [employer]. Patient decided to: Continue current medication management strategy  Care Plan and Follow Up Patient Decision:  Patient agrees to Care Plan and Follow-up.  Plan: Telephone follow up appointment with care management team member scheduled for:  12/2020 to discuss option of increasing Trulicity if needed

## 2020-12-14 ENCOUNTER — Other Ambulatory Visit: Payer: Self-pay

## 2020-12-14 ENCOUNTER — Ambulatory Visit (INDEPENDENT_AMBULATORY_CARE_PROVIDER_SITE_OTHER): Payer: Medicare Other

## 2020-12-14 VITALS — BP 142/80 | HR 76 | Temp 98.0°F | Resp 16 | Ht 68.0 in | Wt 191.6 lb

## 2020-12-14 DIAGNOSIS — Z1231 Encounter for screening mammogram for malignant neoplasm of breast: Secondary | ICD-10-CM

## 2020-12-14 DIAGNOSIS — Z0000 Encounter for general adult medical examination without abnormal findings: Secondary | ICD-10-CM

## 2020-12-14 DIAGNOSIS — M810 Age-related osteoporosis without current pathological fracture: Secondary | ICD-10-CM | POA: Diagnosis not present

## 2020-12-14 NOTE — Patient Instructions (Signed)
 Fall Prevention in the Home, Adult Falls can cause injuries and can happen to people of all ages. There are many things you can do to make your home safe and to help prevent falls. Ask for help when making these changes. What actions can I take to prevent falls? General Instructions  Use good lighting in all rooms. Replace any light bulbs that burn out.  Turn on the lights in dark areas. Use night-lights.  Keep items that you use often in easy-to-reach places. Lower the shelves around your home if needed.  Set up your furniture so you have a clear path. Avoid moving your furniture around.  Do not have throw rugs or other things on the floor that can make you trip.  Avoid walking on wet floors.  If any of your floors are uneven, fix them.  Add color or contrast paint or tape to clearly mark and help you see: ? Grab bars or handrails. ? First and last steps of staircases. ? Where the edge of each step is.  If you use a stepladder: ? Make sure that it is fully opened. Do not climb a closed stepladder. ? Make sure the sides of the stepladder are locked in place. ? Ask someone to hold the stepladder while you use it.  Know where your pets are when moving through your home. What can I do in the bathroom?  Keep the floor dry. Clean up any water on the floor right away.  Remove soap buildup in the tub or shower.  Use nonskid mats or decals on the floor of the tub or shower.  Attach bath mats securely with double-sided, nonslip rug tape.  If you need to sit down in the shower, use a plastic, nonslip stool.  Install grab bars by the toilet and in the tub and shower. Do not use towel bars as grab bars.      What can I do in the bedroom?  Make sure that you have a light by your bed that is easy to reach.  Do not use any sheets or blankets for your bed that hang to the floor.  Have a firm chair with side arms that you can use for support when you get dressed. What can I do  in the kitchen?  Clean up any spills right away.  If you need to reach something above you, use a step stool with a grab bar.  Keep electrical cords out of the way.  Do not use floor polish or wax that makes floors slippery. What can I do with my stairs?  Do not leave any items on the stairs.  Make sure that you have a light switch at the top and the bottom of the stairs.  Make sure that there are handrails on both sides of the stairs. Fix handrails that are broken or loose.  Install nonslip stair treads on all your stairs.  Avoid having throw rugs at the top or bottom of the stairs.  Choose a carpet that does not hide the edge of the steps on the stairs.  Check carpeting to make sure that it is firmly attached to the stairs. Fix carpet that is loose or worn. What can I do on the outside of my home?  Use bright outdoor lighting.  Fix the edges of walkways and driveways and fix any cracks.  Remove anything that might make you trip as you walk through a door, such as a raised step or threshold.  Trim   any bushes or trees on paths to your home.  Check to see if handrails are loose or broken and that both sides of all steps have handrails.  Install guardrails along the edges of any raised decks and porches.  Clear paths of anything that can make you trip, such as tools or rocks.  Have leaves, snow, or ice cleared regularly.  Use sand or salt on paths during winter.  Clean up any spills in your garage right away. This includes grease or oil spills. What other actions can I take?  Wear shoes that: ? Have a low heel. Do not wear high heels. ? Have rubber bottoms. ? Feel good on your feet and fit well. ? Are closed at the toe. Do not wear open-toe sandals.  Use tools that help you move around if needed. These include: ? Canes. ? Walkers. ? Scooters. ? Crutches.  Review your medicines with your doctor. Some medicines can make you feel dizzy. This can increase your  chance of falling. Ask your doctor what else you can do to help prevent falls. Where to find more information  Centers for Disease Control and Prevention, [employer]: www.cdc.gov  National Institute on Aging: www.nia.nih.gov Contact a doctor if:  You are afraid of falling at home.  You feel weak, drowsy, or dizzy at home.  You fall at home. Summary  There are many simple things that you can do to make your home safe and to help prevent falls.  Ways to make your home safe include removing things that can make you trip and installing grab bars in the bathroom.  Ask for help when making these changes in your home. This information is not intended to replace advice given to you by your health care provider. Make sure you discuss any questions you have with your health care provider. Document Revised: 02/04/2020 Document Reviewed: 02/04/2020 Elsevier Patient Education  2021 [employer]   Health Maintenance, Female Adopting a healthy lifestyle and getting preventive care are important in promoting health and wellness. Ask your health care provider about:  The right schedule for you to have regular tests and exams.  Things you can do on your own to prevent diseases and keep yourself healthy. What should I know about diet, weight, and exercise? Eat a healthy diet  Eat a diet that includes plenty of vegetables, fruits, low-fat dairy products, and lean protein.  Do not eat a lot of foods that are high in solid fats, added sugars, or sodium.   Maintain a healthy weight Body mass index (BMI) is used to identify weight problems. It estimates body fat based on height and weight. Your health care provider can help determine your BMI and help you achieve or maintain a healthy weight. Get regular exercise Get regular exercise. This is one of the most important things you can do for your health. Most adults should:  Exercise for at least 150 minutes each week. The exercise should increase  your heart rate and make you sweat (moderate-intensity exercise).  Do strengthening exercises at least twice a week. This is in addition to the moderate-intensity exercise.  Spend less time sitting. Even light physical activity can be beneficial. Watch cholesterol and blood lipids Have your blood tested for lipids and cholesterol at 72 years of age, then have this test every 5 years. Have your cholesterol levels checked more often if:  Your lipid or cholesterol levels are high.  You are older than 72 years of age.  You are at   high risk for heart disease. What should I know about cancer screening? Depending on your health history and family history, you may need to have cancer screening at various ages. This may include screening for:  Breast cancer.  Cervical cancer.  Colorectal cancer.  Skin cancer.  Lung cancer. What should I know about heart disease, diabetes, and high blood pressure? Blood pressure and heart disease  High blood pressure causes heart disease and increases the risk of stroke. This is more likely to develop in people who have high blood pressure readings, are of African descent, or are overweight.  Have your blood pressure checked: ? Every 3-5 years if you are 18-39 years of age. ? Every year if you are 40 years old or older. Diabetes Have regular diabetes screenings. This checks your fasting blood sugar level. Have the screening done:  Once every three years after age 40 if you are at a normal weight and have a low risk for diabetes.  More often and at a younger age if you are overweight or have a high risk for diabetes. What should I know about preventing infection? Hepatitis B If you have a higher risk for hepatitis B, you should be screened for this virus. Talk with your health care provider to find out if you are at risk for hepatitis B infection. Hepatitis C Testing is recommended for:  Everyone born from 1945 through 1965.  Anyone with known  risk factors for hepatitis C. Sexually transmitted infections (STIs)  Get screened for STIs, including gonorrhea and chlamydia, if: ? You are sexually active and are younger than 72 years of age. ? You are older than 72 years of age and your health care provider tells you that you are at risk for this type of infection. ? Your sexual activity has changed since you were last screened, and you are at increased risk for chlamydia or gonorrhea. Ask your health care provider if you are at risk.  Ask your health care provider about whether you are at high risk for HIV. Your health care provider may recommend a prescription medicine to help prevent HIV infection. If you choose to take medicine to prevent HIV, you should first get tested for HIV. You should then be tested every 3 months for as long as you are taking the medicine. Pregnancy  If you are about to stop having your period (premenopausal) and you may become pregnant, seek counseling before you get pregnant.  Take 400 to 800 micrograms (mcg) of folic acid every day if you become pregnant.  Ask for birth control (contraception) if you want to prevent pregnancy. Osteoporosis and menopause Osteoporosis is a disease in which the bones lose minerals and strength with aging. This can result in bone fractures. If you are 65 years old or older, or if you are at risk for osteoporosis and fractures, ask your health care provider if you should:  Be screened for bone loss.  Take a calcium or vitamin D supplement to lower your risk of fractures.  Be given hormone replacement therapy (HRT) to treat symptoms of menopause. Follow these instructions at home: Lifestyle  Do not use any products that contain nicotine or tobacco, such as cigarettes, e-cigarettes, and chewing tobacco. If you need help quitting, ask your health care provider.  Do not use street drugs.  Do not share needles.  Ask your health care provider for help if you need support or  information about quitting drugs. Alcohol use  Do not drink alcohol   if: ? Your health care provider tells you not to drink. ? You are pregnant, may be pregnant, or are planning to become pregnant.  If you drink alcohol: ? Limit how much you use to 0-1 drink a day. ? Limit intake if you are breastfeeding.  Be aware of how much alcohol is in your drink. In the [place] one drink equals one 12 oz bottle of beer (355 mL), one 5 oz glass of wine (148 mL), or one 1 oz glass of hard liquor (44 mL). General instructions  Schedule regular health, dental, and eye exams.  Stay current with your vaccines.  Tell your health care provider if: ? You often feel depressed. ? You have ever been abused or do not feel safe at home. Summary  Adopting a healthy lifestyle and getting preventive care are important in promoting health and wellness.  Follow your health care provider's instructions about healthy diet, exercising, and getting tested or screened for diseases.  Follow your health care provider's instructions on monitoring your cholesterol and blood pressure. This information is not intended to replace advice given to you by your health care provider. Make sure you discuss any questions you have with your health care provider. Document Revised: 06/26/2018 Document Reviewed: 06/26/2018 Elsevier Patient Education  2021 [employer]  

## 2020-12-14 NOTE — Progress Notes (Signed)
Subjective:   April Munoz is a 70 y.o. female who presents for Medicare Annual (Subsequent) preventive examination.  This wellness visit is conducted by a nurse.  The patient's medications were reviewed and reconciled since the patient's last visit.  History details were provided by the patient.  The history appears to be reliable.    Patient's last AWV was one year ago.   Medical History: Patient history and Family history was reviewed  Medications, Allergies, and preventative health maintenance was reviewed and updated.   Review of Systems    Review of Systems  Constitutional: Negative.   HENT: Negative.   Eyes: Positive for visual disturbance.       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  Negative for apnea, cough and shortness of breath.   Cardiovascular: Negative.  Negative for chest pain and palpitations.  Gastrointestinal: Negative.   Genitourinary: Negative.   Musculoskeletal: Negative.   Skin: Positive for color change.       Bruise colored spots on left arm - she is going to schedule appt with dermatology for evaluation   Neurological: Positive for numbness. Negative for dizziness and headaches.       BL feet  Psychiatric/Behavioral: Positive for sleep disturbance. Negative for confusion, decreased concentration and suicidal ideas. The patient is nervous/anxious.        Insomnia without Ambien  Anxious feeling about overall health Eats in response to anxiety   Cardiac Risk Factors include: advanced age (>83men, >32 women);diabetes mellitus;dyslipidemia;hypertension     Objective:    Today's Vitals   12/14/20 1559  BP: (!) 142/80  Pulse: 76  Resp: 16  Temp: 98 F (36.7 C)  SpO2: 97%  Weight: 191 lb 9.6 oz (86.9 kg)  Height: 5\' 8"  (1.727 m)  PainSc: 0-No pain   Body mass index is 29.13 kg/m.  Advanced Directives 12/14/2020 11/26/2020 12/11/2019 07/30/2019 03/21/2019 11/06/2013  Does Patient Have a Medical Advance Directive? Yes Yes Yes Yes Yes  Patient has advance directive, copy not in chart  Type of Advance Directive [place];Living will [place];Living will [place];Living will [place];Living will [place];Living will Living will  Does patient want to make changes to medical advance directive? No - Patient declined - No - Patient declined No - Patient declined - No change requested  Copy of [place] in Chart? No - copy requested No - copy requested No - copy requested No - copy requested - -    Current Medications (verified) Outpatient Encounter Medications as of 12/14/2020  Medication Sig  . acyclovir (ZOVIRAX) 800 MG tablet TAKE ONE (1) TABLET FOUR (4) TIMES DAILYAS NEEDED FOR SHINGLES (Patient taking differently: Take 800 mg by mouth daily as needed (Shingles).)  . atorvastatin (LIPITOR) 10 MG tablet Take 10 mg by mouth daily.  . B Complex-C-Folic Acid TABS Take 1 tablet by mouth daily.  . Calcium Carb-Cholecalciferol (LIQUID CALCIUM WITH D3) 9022543986 MG-UNIT CAPS Take 600 mg by mouth daily.  . Calcium-Vitamin D-Vitamin K (VIACTIV CALCIUM PLUS D) 650-12.5-40 MG-MCG-MCG CHEW Chew 1 capsule by mouth daily.  . Coenzyme Q10 100 MG TABS Take 1 tablet by mouth daily.  . Continuous Blood Gluc Receiver (FREESTYLE LIBRE 2 READER) DEVI E11.21 Check blood sugar 4 times daily as directed  . Continuous Blood Gluc Sensor (FREESTYLE LIBRE 2 SENSOR) MISC E11.21 Change sensor every 14 days as directed  . cyanocobalamin (,VITAMIN B-12,) 1000 MCG/ML injection  Inject 1 mL (1,000 mcg total) into the muscle every 30 (thirty) days.  . dapagliflozin propanediol (FARXIGA) 10 MG TABS tablet Take 10 mg by mouth daily.  . Dulaglutide (TRULICITY) 2.99 BZ/1.6RC SOPN Inject 0.75 mg into the skin once a week.  Marland Kitchen EPINEPHrine 0.3 mg/0.3 mL IJ SOAJ injection Inject 0.3 mg into the muscle as needed.  . flecainide (TAMBOCOR) 100 MG tablet Take 1  tablet (100 mg total) by mouth 2 (two) times daily.  Marland Kitchen levothyroxine (SYNTHROID) 75 MCG tablet Take 75 mcg by mouth daily before breakfast.  . levothyroxine (SYNTHROID) 88 MCG tablet Take 1 tablet (88 mcg total) by mouth daily before breakfast.  . losartan (COZAAR) 50 MG tablet Take 1 tablet (50 mg total) by mouth 2 (two) times daily. (Patient taking differently: Take 50 mg by mouth daily.)  . Lutein 20 MG TABS Take 1 tablet by mouth daily.  . Melatonin 10 MG TABS Take 10 mg by mouth 4 (four) times a week.  . metoprolol succinate (TOPROL-XL) 25 MG 24 hr tablet TAKE 1 TABLET BY MOUTH IN THE MORNING AND 1 TABLET AT BEDTIME. (Patient taking differently: Take 25 mg by mouth 2 (two) times daily.)  . Multiple Vitamin (MULTIVITAMIN WITH MINERALS) TABS tablet Take 1 tablet by mouth daily.  Marland Kitchen omega-3 acid ethyl esters (LOVAZA) 1 g capsule Take 2 capsules by mouth 2 (two) times daily.  Marland Kitchen omeprazole (PRILOSEC) 20 MG capsule TAKE ONE (1) CAPSULE EACH DAY  . Probiotic Product (PROBIOTIC & ACIDOPHILUS EX ST PO) Take 1 tablet by mouth daily.   . rivaroxaban (XARELTO) 20 MG TABS tablet Take 1 tablet (20 mg total) by mouth daily with supper.  . torsemide (DEMADEX) 20 MG tablet Take 1 tablet (20 mg total) by mouth 3 (three) times a week. Please take this on Monday, Wednesday, and Friday. (Patient taking differently: Take 20 mg by mouth every Monday, Wednesday, and Friday.)  . Vitamin D, Ergocalciferol, (DRISDOL) 1.25 MG (50000 UNIT) CAPS capsule TAKE 1 CAPSULE BY MOUTH EVERY MONDAY (Patient taking differently: Take 50,000 Units by mouth every 7 (seven) days.)  . vortioxetine HBr (TRINTELLIX) 20 MG TABS tablet Take 20 mg by mouth daily.  . Zinc Sulfate (ZINC 15 PO) Take 150 mg by mouth 3 (three) times a week.  . zolpidem (AMBIEN) 10 MG tablet TAKE 1 TABLET BY MOUTH AT BEDTIME AS NEEDED FOR SLEEP (Patient taking differently: Take 10 mg by mouth at bedtime as needed for sleep.)  . metFORMIN (GLUCOPHAGE) 1000 MG tablet  Take 1,000 mg by mouth daily as needed (high blood glucoce). (Patient not taking: Reported on 12/14/2020)   No facility-administered encounter medications on file as of 12/14/2020.    Allergies (verified) Prednisone   History: Past Medical History:  Diagnosis Date  . Anxiety   . Arthritis   . Ataxic gait   . Atrial fibrillation ([place])   . Atrophy of thyroid   . B12 deficiency 10/31/2017  . Cellulitis of abdominal wall 07/07/2016  . Chronic anticoagulation 05/31/2018  . Depression   . Depression, major, recurrent, mild ([place]) 09/16/2019  . Diabetes mellitus without complication ([place])   . Diabetic glomerulopathy ([place]) 09/16/2019  . Esophageal stricture 06/05/2017  . Essential hypertension 02/24/2013  . Fatigue 01/20/2020  . Fibromyalgia 02/24/2013  . Full dentures   . Hereditary and idiopathic peripheral neuropathy 02/24/2013  . High risk medication use 11/07/2017  . Hyperlipidemia 02/24/2013  . Hypertension   . Hypertensive heart disease 02/24/2013  . Incontinence   .  Insomnia   . Longstanding persistent atrial fibrillation ([place]) 09/16/2019  . Mild vitamin D deficiency 09/16/2019  . Mixed hyperlipidemia 02/24/2013  . Murmur, cardiac 08/15/2015  . Neuropathy in diabetes ([place]) 10/31/2017  . Osteoporosis   . Other amnesia   . Other amnesia   . 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   . Paroxysmal atrial fibrillation ([place]) 08/15/2015   CHADS2vasc=3 CHADS2vasc=3  . Primary insomnia   . Restless legs syndrome 03/14/2013  . Secondary hypothyroidism 09/16/2019  . Thoracic or lumbosacral neuritis or radiculitis 02/24/2013  . Type 2 diabetes mellitus, without long-term current use of insulin ([place]) 02/24/2013  . Urge incontinence of urine 09/16/2019  . Wears glasses    Past Surgical History:  Procedure Laterality Date  . A-FLUTTER ABLATION N/A 07/30/2019   Procedure: A-FLUTTER ABLATION;  Surgeon: Constance Haw, MD;  Location: [facility];  Service: Cardiovascular;   Laterality: N/A;  . ATRIAL FIBRILLATION ABLATION N/A 03/21/2019   Procedure: ATRIAL FIBRILLATION ABLATION;  Surgeon: Constance Haw, MD;  Location: [facility];  Service: Cardiovascular;  Laterality: N/A;  . BREAST REDUCTION SURGERY  1983  . BUBBLE STUDY  11/26/2020   Procedure: BUBBLE STUDY;  Surgeon: Pixie Casino, MD;  Location: [facility];  Service: Cardiovascular;;  . CARDIOVERSION N/A 11/26/2020   Procedure: CARDIOVERSION;  Surgeon: Pixie Casino, MD;  Location: [facility];  Service: Cardiovascular;  Laterality: N/A;  . CATARACT EXTRACTION, BILATERAL  2018, 2019  . CHOLECYSTECTOMY  1971   open  . DIAGNOSTIC LAPAROSCOPY  2010   lysis of adhesions  . DILATION AND CURETTAGE OF UTERUS  2004  . KNEE ARTHROSCOPY  1998   left  . KNEE ARTHROSCOPY W/ LATERAL RETINACULAR REPAIR    . MASS EXCISION Left 11/11/2013   Procedure: EXCISION MUCOID CYST LEFT INDEX FINGER/DEBRIDEMENT LEFT INDEX FINGER;  Surgeon: Wynonia Sours, MD;  Location: [facility];  Service: Orthopedics;  Laterality: Left;  ANESTHESIA: IV REGIONAL/FAB  . SHOULDER ARTHROSCOPY W/ ROTATOR CUFF REPAIR  2007   left  . TEE WITHOUT CARDIOVERSION N/A 11/26/2020   Procedure: TRANSESOPHAGEAL ECHOCARDIOGRAM (TEE);  Surgeon: Pixie Casino, MD;  Location[facility] ENDOSCOPY;  Service: Cardiovascular;  Laterality: N/A;  . TRIGGER FINGER RELEASE Left 11/11/2013   Procedure: RELEASE A-1 PULLEY LEFT RING FINGER;  Surgeon: Wynonia Sours, MD;  Location: [facility];  Service: Orthopedics;  Laterality: Left;  . UMBILICAL HERNIA REPAIR  2008, 2010   Family History  Problem Relation Age of Onset  . Heart failure Father   . Heart disease Brother   . Heart attack Brother   . Heart failure Paternal Grandfather   . Stroke Paternal Grandfather   . Atrial fibrillation Mother   . Heart attack Mother   . Diabetes Maternal Grandmother    Social History   Socioeconomic History  . Marital status: Married     Spouse name: Liliane Channel  Tobacco Use  . Smoking status: Never Smoker  . Smokeless tobacco: Never Used  Vaping Use  . Vaping Use: Never used  Substance and Sexual Activity  . Alcohol use: Yes    Alcohol/week: 3.0 - 4.0 standard drinks    Types: 1 - 2 Glasses of wine, 2 Shots of liquor per week    Comment: 4 times a week  . Drug use: No  . Sexual activity: Not on file   Social Determinants of Health   Financial Resource Strain: Not on file  Food  Insecurity: No Food Insecurity  . Worried About Charity fundraiser in the Last Year: Never true  . Ran Out of Food in the Last Year: Never true  Transportation Needs: No Transportation Needs  . Lack of Transportation (Medical): No  . Lack of Transportation (Non-Medical): No  Physical Activity: Not on file  Stress: Not on file  Social Connections: Not on file    Tobacco Counseling Counseling given: No  Clinical Intake: Pre-visit preparation completed: Yes Pain : No/denies pain Pain Score: 0-No pain   BMI - recorded: 29.13 Nutritional Status: BMI 25 -29 Overweight Nutritional Risks: None Diabetes: Yes CBG done?: No Did pt. bring in CBG monitor from home?: No How often do you need to have someone help you when you read instructions, pamphlets, or other written materials from your doctor or pharmacy?: 3 - Sometimes Interpreter Needed?: No   Activities of Daily Living In your present state of health, do you have any difficulty performing the following activities: 12/14/2020  Hearing? N  Vision? Y  Comment changes noted since stroke  Difficulty concentrating or making decisions? N  Walking or climbing stairs? N  Dressing or bathing? N  Doing errands, shopping? N  Preparing Food and eating ? N  Using the Toilet? N  In the past six months, have you accidently leaked urine? N  Do you have problems with loss of bowel control? N  Managing your Medications? N  Managing your Finances? N  Housekeeping or managing your Housekeeping? N   Some recent data might be hidden    Patient Care Team: [name], MD as PCP - General (Family Medicine) Richardo Priest, MD as PCP - Cardiology (Cardiology) Constance Haw, MD as PCP - Electrophysiology (Cardiology) Richardo Priest, MD as Consulting Physician (Cardiology) Misenheimer, Christia Reading, MD as Consulting Physician (Unknown Physician Specialty) Pollyann Samples, MD as Consulting Physician (General Surgery) Burnice Logan, [facility] as Pharmacist (Pharmacist)  [place], [place]    Assessment:   This is a routine wellness examination for [place].  Hearing/Vision screen Vision exam on file from March 2022 - [place] issues and exercise activities discussed: Current Exercise Habits: The patient does not participate in regular exercise at present, Exercise limited by: None identified  Depression Screen PHQ 2/9 Scores 12/14/2020 12/02/2020 10/02/2020 09/03/2020 08/30/2020 05/27/2020 05/27/2020  PHQ - 2 Score 4 6 6 5 4 2 2   PHQ- 9 Score 12 18 18 15 12 14 14     Fall Risk Fall Risk  12/14/2020 10/01/2020 09/03/2020 05/27/2020 12/11/2019  Falls in the past year? 1 1 1 1 1   Number falls in past yr: 1 1 0 1 0  Injury with Fall? 0 1 1 - 1  Comment - - - - bruised hip, glasses cut above eye  Risk for fall due to : Other (Comment);History of fall(s) Impaired balance/gait - - History of fall(s)  Risk for fall due to: Comment numbness in feet - - - -  Follow up Falls evaluation completed;Education provided;Falls prevention discussed - - - Falls evaluation completed;Falls prevention discussed    FALL RISK PREVENTION PERTAINING TO THE HOME: Home free of loose throw rugs in walkways, pet beds, electrical cords, etc? Yes  Adequate lighting in your home to reduce risk of falls? Yes   ASSISTIVE DEVICES UTILIZED TO PREVENT FALLS:  Life alert? No  Use of a cane, walker or w/c? No  Grab bars in the bathroom? Yes  Shower chair or bench in shower?  No  Elevated toilet seat or a  handicapped toilet? No   Cognitive Function:     6CIT Screen 12/14/2020 12/11/2019  What Year? 0 points 0 points  What month? 0 points 0 points  What time? 0 points 0 points  Count back from 20 0 points 2 points  Months in reverse 2 points 2 points  Repeat phrase 4 points 6 points  Total Score 6 10    Immunizations Immunization History  Administered Date(s) Administered  . Fluad Quad(high Dose 65+) 03/18/2019, 04/25/2020  . Influenza-Unspecified 03/18/2019, 05/14/2020  . Moderna Sars-Covid-2 Vaccination 09/09/2019, 10/04/2019, 04/19/2020  . Pneumococcal Conjugate-13 05/14/2015  . Pneumococcal Polysaccharide-23 05/16/2013, 07/27/2017    TDAP status: Due, Education has been provided regarding the importance of this vaccine. Advised may receive this vaccine at local pharmacy or Health Dept. Aware to provide a copy of the vaccination record if obtained from local pharmacy or Health Dept. Verbalized acceptance and understanding.   Flu Vaccine status: Up to date  Pneumococcal vaccine status: Up to date  Covid-19 vaccine status: Completed vaccines  Qualifies for Shingles Vaccine? Yes   Zostavax completed No   Shingrix Completed?: No.    Education has been provided regarding the importance of this vaccine. Patient has been advised to call insurance company to determine out of pocket expense if they have not yet received this vaccine. Advised may also receive vaccine at local pharmacy or Health Dept. Verbalized acceptance and understanding.  Screening Tests Health Maintenance  Topic Date Due  . Hepatitis C Screening  Never done  . TETANUS/TDAP  Never done  . Zoster Vaccines- Shingrix (1 of 2) Never done  . DEXA SCAN  07/18/2020  . MAMMOGRAM  11/24/2020  . COLONOSCOPY (Pts 45-35yrs Insurance coverage will need to be confirmed)  12/01/2020  . INFLUENZA VACCINE  02/14/2021  . HEMOGLOBIN A1C  03/03/2021  . FOOT EXAM  05/27/2021  . OPHTHALMOLOGY EXAM  10/14/2021  . COVID-19 Vaccine   Completed  . PNA vac Low Risk Adult  Completed  . HPV VACCINES  Aged Out    Health Maintenance  Health Maintenance Due  Topic Date Due  . TETANUS/TDAP  Never done  . Zoster Vaccines- Shingrix (1 of 2) Never done  . DEXA SCAN  07/18/2020  . MAMMOGRAM  11/24/2020  . COLONOSCOPY (Pts 45-72yrs Insurance coverage will need to be confirmed)  12/01/2020    Colorectal cancer screening: Due this year - she will schedule with Dr Lyda Jester  Mammogram status: Ordered   Bone Density status: Ordered   Lung Cancer Screening: (Low Dose CT Chest recommended if Age 31-80 years, 30 pack-year currently smoking OR have quit w/in 15years.) does not qualify.   Additional Screening:  Vision Screening: Recommended annual ophthalmology exams for early detection of glaucoma and other disorders of the eye. Is the patient up to date with their annual eye exam?  Yes  Who is the provider or what is the name of the office in which the patient attends annual eye exams? [place] Screening: Recommended annual dental exams for proper oral hygiene    Plan:    1- Advance Directive - copy requested 2- Mammogram and DEXA will be scheduled at [facility] 3- Colon Cancer Screening - patient will schedule with Dr Lyda Jester 4- Shingrix and TDap Vaccine - patient will check with pharmacy 5- Diet - patient will be more aware of what she eats trying to eat healthier to control weight and diabetes 6- Exercise - patient  will try to walk daily to benefit diabetes and anxiety   I have personally reviewed and noted the following in the patient's chart:   . Medical and social history . Use of alcohol, tobacco or illicit drugs  . Current medications and supplements including opioid prescriptions.  . Functional ability and status . Nutritional status . Physical activity . Advanced directives . List of other physicians . Hospitalizations, surgeries, and ER visits in previous 12  months . Vitals . Screenings to include cognitive, depression, and falls . Referrals and appointments  In addition, I have reviewed and discussed with patient certain preventive protocols, quality metrics, and best practice recommendations. A written personalized care plan for preventive services as well as general preventive health recommendations were provided to patient.     Erie Noe, LPN   [id number]/[id number]

## 2020-12-17 ENCOUNTER — Telehealth: Payer: Self-pay | Admitting: Family Medicine

## 2020-12-17 ENCOUNTER — Other Ambulatory Visit: Payer: Self-pay | Admitting: Family Medicine

## 2020-12-17 MED ORDER — BREXPIPRAZOLE 0.5 MG PO TABS: 0.5000 mg | ORAL_TABLET | Freq: Every day | ORAL | 0 refills | Status: DC

## 2020-12-17 NOTE — Telephone Encounter (Signed)
Called patient to tell her about the mammogram and dexa appt. Pt wanted me to send you a note about the Trintellix says it's not working for her. Lots of stress going on now with the health of her husband, she wants to see about trying something else. Would someone please call and advise the patient. Thank you.

## 2020-12-17 NOTE — Telephone Encounter (Signed)
   April Munoz has been scheduled for the following appointment:  WHAT: Mammogram and dexa WHERE: [facility] DATE: 01/12/21 TIME: 11:00 am Arrival time  patient has been made aware.

## 2020-12-20 DIAGNOSIS — R1013 Epigastric pain: Secondary | ICD-10-CM | POA: Diagnosis not present

## 2020-12-20 DIAGNOSIS — K2270 Barrett's esophagus without dysplasia: Secondary | ICD-10-CM | POA: Diagnosis not present

## 2020-12-20 NOTE — Telephone Encounter (Signed)
Pt returned call. Made her aware. Pt VU.   Royce Macadamia, [place] 12/20/20 2:55 PM

## 2020-12-20 NOTE — Telephone Encounter (Signed)
I sent rexulti low dose to ADD to trintillex. Kc

## 2020-12-20 NOTE — Telephone Encounter (Addendum)
Called home number. Individual answered, pt was not home. Individual will have pt call office back.   Royce Macadamia, [place] 12/20/20 1:32 PM

## 2020-12-22 DIAGNOSIS — M1712 Unilateral primary osteoarthritis, left knee: Secondary | ICD-10-CM | POA: Diagnosis not present

## 2020-12-27 ENCOUNTER — Other Ambulatory Visit: Payer: Self-pay

## 2020-12-27 MED ORDER — REXULTI 0.5 MG PO TABS: 0.5000 mg | ORAL_TABLET | Freq: Every day | ORAL | 0 refills | Status: DC

## 2020-12-30 ENCOUNTER — Telehealth: Payer: Self-pay | Admitting: *Deleted

## 2020-12-30 ENCOUNTER — Other Ambulatory Visit: Payer: Self-pay

## 2020-12-30 NOTE — Telephone Encounter (Signed)
   [facility] Pre-operative Risk Assessment    Patient Name: April Munoz  DOB: October 21, 1950  MRN: [id number]   HEARTCARE STAFF: - Please ensure there is not already an duplicate clearance open for this procedure. - Under Visit Info/Reason for Call, type in Other and utilize the format Clearance MM/DD/YY or Clearance TBD. Do not use dashes or single digits. - If request is for dental extraction, please clarify the # of teeth to be extracted. - If the patient is currently at the dentist's office, call Pre-Op APP to address. If the patient is not currently in the dentist office, please route to the Pre-Op pool  Request for surgical clearance:  What type of surgery is being performed? COLONOSCOPY   When is this surgery scheduled? 01/11/21   What type of clearance is required (medical clearance vs. Pharmacy clearance to hold med vs. Both)? BOTH  Are there any medications that need to be held prior to surgery and how long? April Munoz   Practice name and name of physician performing surgery? [facility]; DR. Christia Reading Munoz   What is the office phone number? [phone]   7.   What is the office fax number? [phone]  8.   Anesthesia type (None, local, MAC, general) ? NOT LISTED (PROPOFOL ?)   April Munoz 12/30/2020, 10:38 AM  _________________________________________________________________   (provider comments below)

## 2020-12-30 NOTE — Telephone Encounter (Signed)
Pt has appt 01/03/21 with Dr. Curt Bears. Will forward notes to MD for upcoming appt. Will send FYI to requesting office pt has appt 01/03/21.

## 2020-12-30 NOTE — Telephone Encounter (Signed)
Patient with diagnosis of afib on Xarelto for anticoagulation.    Procedure: colonoscopy Date of procedure: 01/11/21  Patient had a TEE cardioversion on 11/26/20 She has hx of TIA and recent retinal branch occlusion in March  CHA2DS2-VASc Score = 6  This indicates a 9.7% annual risk of stroke. The patient's score is based upon: CHF History: No HTN History: Yes Diabetes History: Yes Stroke History: Yes Vascular Disease History: No Age Score: 1 Gender Score: 1     CrCl 55 ml/min Platelet count 280  Due to recent retinal branch occlusion, I will make sure Dr. Ileana Ladd or Dr. Bettina Gavia is ok with holding Xarelto 1 day prior to procedure at the end of the month.

## 2020-12-30 NOTE — Telephone Encounter (Signed)
   Name: April Munoz  DOB: [birth year]  MRN: [id number]  Primary Cardiologist: Shirlee More, MD  Chart reviewed as part of pre-operative protocol coverage. Because of [name] past medical history and time since last visit, she will require a follow-up visit in order to better assess preoperative cardiovascular risk.  Pt has appointment with Dr. Curt Bears 01/03/21.   Pre-op covering staff: - Please add "pre-op clearance" to the appointment notes so provider is aware.  If applicable, this message will also be routed to pharmacy pool and/or primary cardiologist for input on holding anticoagulant/antiplatelet agent as requested below so that this information is available to the clearing provider at time of patient's appointment.   Kathyrn Drown, NP  12/30/2020, 10:56 AM

## 2020-12-30 NOTE — Telephone Encounter (Signed)
April Munoz called to report that she is unable to afford the Rexulti (her cost is going to be $500.) She is taking her Trintillex as directed.  She is going to wait until Dr. Tobie Poet is back to discuss changing medication.

## 2020-12-31 ENCOUNTER — Other Ambulatory Visit: Payer: Self-pay

## 2020-12-31 ENCOUNTER — Ambulatory Visit (INDEPENDENT_AMBULATORY_CARE_PROVIDER_SITE_OTHER): Payer: Medicare Other

## 2020-12-31 DIAGNOSIS — E1121 Type 2 diabetes mellitus with diabetic nephropathy: Secondary | ICD-10-CM

## 2020-12-31 DIAGNOSIS — I4811 Longstanding persistent atrial fibrillation: Secondary | ICD-10-CM

## 2020-12-31 DIAGNOSIS — E782 Mixed hyperlipidemia: Secondary | ICD-10-CM | POA: Diagnosis not present

## 2020-12-31 NOTE — Progress Notes (Addendum)
Chronic Care Management Pharmacy Note  01/11/2021 Name:  April Munoz MRN:  [id number] DOB:  06/11/1951   Plan Recommendations:  Provided patient samples of Rexulti and coordianted application for patient assistance. Application approved [id number]. Patient is very concerned about husband's health and hopes that Rexulti will improve symptoms.  Patient's home blood sugar readings are much improved since resuming once daily metformin.   Subjective: April Munoz is an 70 y.o. year old female who is a primary patient of Cox, Kirsten, MD.  The CCM team was consulted for assistance with disease management and care coordination needs.    Engaged with patient face to face for initial visit in response to provider referral for pharmacy case management and/or care coordination services.   Consent to Services:  The patient was given the following information about Chronic Care Management services today, agreed to services, and gave verbal consent: 1. CCM service includes personalized support from designated clinical staff supervised by the primary care provider, including individualized plan of care and coordination with other care providers 2. 24/7 contact phone numbers for assistance for urgent and routine care needs. 3. Service will only be billed when office clinical staff spend 20 minutes or more in a month to coordinate care. 4. Only one practitioner may furnish and bill the service in a calendar month. 5.The patient may stop CCM services at any time (effective at the end of the month) by phone call to the office staff. 6. The patient will be responsible for cost sharing (co-pay) of up to 20% of the service fee (after annual deductible is met). Patient agreed to services and consent obtained.  Patient Care Team: [name], MD as PCP - General (Family Medicine) Richardo Priest, MD as PCP - Cardiology (Cardiology) Constance Haw, MD as PCP - Electrophysiology (Cardiology) Richardo Priest, MD as  Consulting Physician (Cardiology) Misenheimer, Christia Reading, MD as Consulting Physician (Unknown Physician Specialty) Pollyann Samples, MD as Consulting Physician (General Surgery) Burnice Logan, [facility] as Pharmacist (Pharmacist)  Recent office visits: 11/02/2020 - ordered ekg, echo, carotid ultrasound and labs.  10/01/2020 - add Farxiga 5 mg daily. Continue with Trulicity. Follow-up with psychiatry.  05/27/2020 - increase zolpidem 10 mg once daily. Work on [employer] and exercise. Lab results: Sugar elevated. TSH abnormal - increase Synthroid 88 mcg daily in am. Continue vitamin D supplement. LDL elevated and TG elevated. LFT and Kidney function normal. No evidence of B12 deficiency.   Recent consult visits: 11/15/2020 - cardio - cha2ds2-vasc 6. Start flecanide 100 mg.  11/05/2020 - stop Eliquis and start Xarelto 20 mg.   08/05/2020 - Cardio - continue beta-blocker and anticoagulation. Order CT scan and PFTs.  06/02/2020 - GI - continue PPI. Fiber daily and miralax prn for constipation.  05/26/2020 - pulmonology - labs ordered. Schedule CT in 3 months.  04/26/2020 - Cardio - take diuretic 3 days a week. Refer to pulmonary if atypical pulmonary infection. Continue anticoagulant. Resume statin since previous TIA and hyperlipidemia.  03/09/2020 - nephrology - manage blood pressure. Low sodium diet. Continue ACE/ARB.    Hospital visits:   Objective:  Lab Results  Component Value Date   CREATININE 0.90 11/26/2020   BUN 21 11/26/2020   GFRNONAA 53 (L) 09/03/2020   GFRAA 61 09/03/2020   NA 141 11/26/2020   K 4.2 11/26/2020   CALCIUM 10.1 11/15/2020   CO2 23 11/15/2020    Lab Results  Component Value Date/Time   HGBA1C 7.1 (H) 09/03/2020 09:53 AM  HGBA1C 7.2 (H) 01/26/2020 09:54 AM   MICROALBUR 150 05/27/2020 09:39 AM   MICROALBUR 150 09/18/2019 01:25 PM    Last diabetic Eye exam:  Lab Results  Component Value Date/Time   HMDIABEYEEXA No Retinopathy 10/14/2020 12:00 AM    Last  diabetic Foot exam: No results found for: HMDIABFOOTEX   Lab Results  Component Value Date   CHOL 156 09/03/2020   HDL 57 09/03/2020   LDLCALC 73 09/03/2020   TRIG 153 (H) 09/03/2020   CHOLHDL 2.7 09/03/2020    Hepatic Function Latest Ref Rng & Units 09/14/2020 09/03/2020 05/31/2020  Total Protein 6.0 - 8.5 g/dL 6.0 6.6 6.4  Albumin 3.7 - 4.7 g/dL 3.8 4.1 4.1  AST 0 - 40 IU/L '24 22 17  ' ALT 0 - 32 IU/L 42(H) 25 29  Alk Phosphatase 44 - 121 IU/L 115 76 68  Total Bilirubin 0.0 - 1.2 mg/dL 0.4 0.5 0.3    Lab Results  Component Value Date/Time   TSH 5.250 (H) 05/31/2020 09:18 AM   TSH 3.240 02/26/2020 08:16 AM    CBC Latest Ref Rng & Units 11/26/2020 11/02/2020 09/14/2020  WBC 3.4 - 10.8 x10E3/uL - 6.9 8.8  Hemoglobin 12.0 - 15.0 g/dL 12.9 13.1 9.9(L)  Hematocrit 36.0 - 46.0 % 38.0 40.1 32.5(L)  Platelets 150 - 450 x10E3/uL - 280 427    Lab Results  Component Value Date/Time   VD25OH 49.8 09/03/2020 09:53 AM   VD25OH 34.4 05/31/2020 09:18 AM    Clinical ASCVD: No  The 10-year ASCVD risk score Mikey Bussing DC Jr., et al., 2013) is: 24.4%   Values used to calculate the score:     Age: 2 years     Sex: Female     Is Non-Hispanic African American: No     Diabetic: Yes     Tobacco smoker: No     Systolic Blood Pressure: 774 mmHg     Is BP treated: Yes     HDL Cholesterol: 57 mg/dL     Total Cholesterol: 156 mg/dL    Depression screen [facility] 2/9 12/14/2020 12/02/2020 10/02/2020  Decreased Interest '2 3 3  ' Down, Depressed, Hopeless '2 3 3  ' PHQ - 2 Score '4 6 6  ' Altered sleeping 0 3 3  Tired, decreased energy '2 3 3  ' Change in appetite '3 1 1  ' Feeling bad or failure about yourself  0 0 1  Trouble concentrating '2 3 2  ' Moving slowly or fidgety/restless '1 2 2  ' Suicidal thoughts 0 0 0  PHQ-9 Score '12 18 18  ' Difficult doing work/chores Somewhat difficult - Very difficult  Some recent data might be hidden      Social History   Tobacco Use  Smoking Status Never  Smokeless Tobacco Never    BP Readings from Last 3 Encounters:  01/03/21 130/70  12/14/20 (!) 142/80  11/26/20 140/70   Pulse Readings from Last 3 Encounters:  01/03/21 68  12/14/20 76  11/26/20 70   Wt Readings from Last 3 Encounters:  01/03/21 183 lb (83 kg)  12/14/20 191 lb 9.6 oz (86.9 kg)  11/26/20 190 lb (86.2 kg)    Assessment/Interventions: Review of patient past medical history, allergies, medications, health status, including review of consultants reports, laboratory and other test data, was performed as part of comprehensive evaluation and provision of chronic care management services.   SDOH:  (Social Determinants of Health) assessments and interventions performed: Yes   CCM Care Plan  Allergies  Allergen Reactions  Prednisone     In high doses causes her to feel crazy    Medications Reviewed Today     Reviewed by Burnice Logan, [facility] (Pharmacist) on 01/07/21 at 76  Med List Status: <None>   Medication Order Taking? Sig Documenting Provider Last Dose Status Informant  acyclovir (ZOVIRAX) 800 MG tablet [id number] Yes TAKE ONE (1) TABLET FOUR (4) TIMES DAILYAS NEEDED FOR SHINGLES  Patient taking differently: Take 800 mg by mouth daily as needed (Shingles).   Cox, Kirsten, MD Taking Active Self  atorvastatin (LIPITOR) 10 MG tablet [id number] Yes Take 10 mg by mouth daily. [provider] Taking Active Self  B Complex-C-Folic Acid TABS [id number] Yes Take 1 tablet by mouth daily. [provider] Taking Active Self  Brexpiprazole (REXULTI) 0.5 MG TABS [id number] No Take 1 tablet (0.5 mg total) by mouth daily. Cox, Kirsten, MD Not Taking Active   Calcium Carb-Cholecalciferol (LIQUID CALCIUM WITH D3) [phone] MG-UNIT CAPS [id number] Yes Take 600 mg by mouth daily. [provider] Taking Active Self  Calcium-Vitamin D-Vitamin K Hinton Rao CALCIUM PLUS D) 650-12.5-40 MG-MCG-MCG CHEW [id number] Yes Chew 1 capsule by mouth daily. [provider] Taking Active    Coenzyme Q10 100 MG TABS [id number] Yes Take 1 tablet by mouth daily. [provider] Taking Active Self  Continuous Blood Gluc Receiver (FREESTYLE LIBRE 2 READER) DEVI [id number] Yes E11.21 Check blood sugar 4 times daily as directed Cox, Kirsten, MD Taking Active Self  Continuous Blood Gluc Sensor (FREESTYLE LIBRE 2 SENSOR) [place] [id number] Yes E11.21 Change sensor every 14 days as directed Cox, Elnita Maxwell, MD Taking Active Self  cyanocobalamin (,VITAMIN B-12,) 1000 MCG/ML injection [id number] Yes Inject 1 mL (1,000 mcg total) into the muscle every 30 (thirty) days. Cox, Kirsten, MD Taking Active Self  dapagliflozin propanediol (FARXIGA) 10 MG TABS tablet [id number] Yes Take 10 mg by mouth daily. [provider] Taking Active   Dulaglutide (TRULICITY) 6.50 PT/4.6FK SOPN [id number] Yes Inject 0.75 mg into the skin once a week. Cox, Kirsten, MD Taking Active Self  EPINEPHrine 0.3 mg/0.3 mL IJ SOAJ injection [id number] No Inject 0.3 mg into the muscle as needed. [name], MD Not Taking Active            Med Note Lenor Derrick Nov 22, 2020 10:20 AM) Pharmacy is out at this time  flecainide (TAMBOCOR) 100 MG tablet [id number] Yes Take 1 tablet (100 mg total) by mouth 2 (two) times daily. Constance Haw, MD Taking Active Self  levothyroxine (SYNTHROID) 75 MCG tablet [id number] Yes Take 75 mcg by mouth daily before breakfast. [provider] Taking Active   levothyroxine (SYNTHROID) 88 MCG tablet [id number] No Take 1 tablet (88 mcg total) by mouth daily before breakfast.  Patient not taking: Reported on 01/07/2021   [name], MD Not Taking Active   losartan (COZAAR) 50 MG tablet [id number] Yes Take 1 tablet (50 mg total) by mouth 2 (two) times daily.  Patient taking differently: Take 50 mg by mouth daily.   [name], MD Taking Active Self           Med Note Owens Shark, [place] B   Thu Dec 02, 2020  9:57 AM) Once daily  Lutein 20 MG TABS [id number] Yes Take 1 tablet by  mouth daily. [provider] Taking Active   Melatonin 10 MG TABS [id number] Yes Take 10 mg by mouth 4 (four) times a week. [provider] Taking Active   metFORMIN (GLUCOPHAGE) 1000 MG tablet  [id number] Yes Take 1,000 mg by mouth daily. [provider] Taking Active   metoprolol succinate (TOPROL-XL) 25 MG 24 hr tablet [id number] Yes TAKE 1 TABLET BY MOUTH IN THE MORNING AND 1 TABLET AT BEDTIME.  Patient taking differently: Take 25 mg by mouth 2 (two) times daily.   Richardo Priest, MD Taking Active   Multiple Vitamin (MULTIVITAMIN WITH MINERALS) TABS tablet [id number] Yes Take 1 tablet by mouth daily. [provider] Taking Active Self  omega-3 acid ethyl esters (LOVAZA) 1 g capsule [id number] Yes Take 2 capsules by mouth 2 (two) times daily. [provider] Taking Active Self  omeprazole (PRILOSEC) 20 MG capsule [id number] Yes TAKE ONE (1) CAPSULE EACH DAY Marge Duncans, PA-C Taking Active   Probiotic Product (PROBIOTIC & ACIDOPHILUS EX ST PO) [id number] Yes Take 1 tablet by mouth daily.  [provider] Taking Active Self           Med Note Stevan Born   Tue May 27, 2019  3:15 PM)    rivaroxaban (XARELTO) 20 MG TABS tablet [id number] Yes Take 1 tablet (20 mg total) by mouth daily with supper. Sherran Needs, NP Taking Active Self  torsemide (DEMADEX) 20 MG tablet [id number] Yes Take 1 tablet (20 mg total) by mouth 3 (three) times a week. Please take this on Monday, Wednesday, and Friday.  Patient taking differently: Take 20 mg by mouth every Monday, Wednesday, and Friday.   Richardo Priest, MD Taking Active   Vitamin D, Ergocalciferol, (DRISDOL) 1.25 MG (50000 UNIT) CAPS capsule [id number] Yes TAKE 1 CAPSULE BY MOUTH EVERY MONDAY  Patient taking differently: Take 50,000 Units by mouth every 7 (seven) days.   Marge Duncans, PA-C Taking Active   vortioxetine HBr (TRINTELLIX) 20 MG TABS tablet [id number] Yes Take 20 mg by mouth daily. [provider] Taking Active Self           Med Note Owens Shark, SARA B   Thu Dec 02, 2020  2:00 PM) Recommended dose increase to 20 mg daily. Approved by Dr. Tobie Poet.   Zinc Sulfate (ZINC 15 PO) [id number] Yes Take 150 mg by mouth 3 (three) times a week. [provider] Taking Active Self  zolpidem (AMBIEN) 10 MG tablet [id number] Yes TAKE 1 TABLET BY MOUTH AT BEDTIME AS NEEDED FOR SLEEP  Patient taking differently: Take 10 mg by mouth at bedtime as needed for sleep.   CoxElnita Maxwell, MD Taking Active             Patient Active Problem List   Diagnosis Date Noted   Atypical atrial flutter ([place])    Hypertension    Diabetes mellitus without complication ([place])    Depression    Atrophy of thyroid    Atrial fibrillation (HCC)    Anxiety    Arthritis    Ataxic gait    Full dentures    Incontinence    Insomnia    Other amnesia    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    Primary insomnia    Wears glasses    Fatigue 01/20/2020   Diabetic glomerulopathy ([place]) 09/16/2019   Urge incontinence of urine 09/16/2019   Secondary hypothyroidism 09/16/2019   Longstanding persistent atrial fibrillation ([place]) 09/16/2019   Depression, major, recurrent, mild ([place]) 09/16/2019   Mild vitamin D deficiency 09/16/2019   Chronic anticoagulation 05/31/2018   High risk medication use 11/07/2017   B12 deficiency 10/31/2017   Neuropathy in diabetes ([place]) 10/31/2017  Esophageal stricture 06/05/2017   Cellulitis of abdominal wall 07/07/2016   Murmur, cardiac 08/15/2015   Paroxysmal atrial fibrillation (HCC) 08/15/2015   Restless legs syndrome 03/14/2013   Hypertensive heart disease 02/24/2013   Fibromyalgia 02/24/2013   Hereditary and idiopathic peripheral neuropathy 02/24/2013   Mixed hyperlipidemia 02/24/2013   Osteoporosis 02/24/2013   Thoracic or lumbosacral neuritis or radiculitis 02/24/2013   Essential hypertension 02/24/2013   Hyperlipidemia 02/24/2013   Type 2  diabetes mellitus, without long-term current use of insulin ([place]) 02/24/2013    Immunization History  Administered Date(s) Administered   Fluad Quad(high Dose 65+) 03/18/2019, 04/25/2020   Influenza-Unspecified 03/18/2019, 05/14/2020   Moderna Sars-Covid-2 Vaccination 09/09/2019, 10/04/2019, 04/19/2020   Pneumococcal Conjugate-13 05/14/2015   Pneumococcal Polysaccharide-23 05/16/2013, 07/27/2017    Conditions to be addressed/monitored:  Hypertension, Hyperlipidemia, Diabetes, Hypothyroidism, Depression, Anxiety, Osteoporosis and Insomnia.   Care Plan : [place]  Updates made by Burnice Logan, [place] since 01/11/2021 12:00 AM     Problem: afib, diabetes, hypertension, hyperlipidemia   Priority: High  Onset Date: 08/30/2020     Long-Range Goal: Disease Management   Start Date: 08/30/2020  Expected End Date: 08/30/2021  Recent Progress: On track  Priority: High  Note:   Current Barriers:  Unable to independently afford treatment regimen Unable to maintain control of diabetes  Pharmacist Clinical Goal(s):  Over the next 90 days, patient will verbalize ability to afford treatment regimen achieve control of diabetes as evidenced by a1c through collaboration with PharmD and provider.   Interventions: 1:1 collaboration with Cox, Elnita Maxwell, MD regarding development and update of comprehensive plan of care as evidenced by provider attestation and co-signature Inter-disciplinary care team collaboration (see longitudinal plan of care) Comprehensive medication review performed; medication list updated in electronic medical record  Hypertension (BP goal <130/80) -controlled -Current treatment: Losartan 50 mg daily Metoprolol XL 25 mg bid Torsemide 20 mg three times weekly  -Medications previously tried: none reported  -Current home readings: ~140/80  -Current dietary habits: reports eating during the night some.  -Current exercise habits: none reported  -Denies  hypotensive/hypertensive symptoms -Educated on BP goals and benefits of medications for prevention of heart attack, stroke and kidney damage; Daily salt intake goal < 2300 mg; Exercise goal of 150 minutes per week; Importance of home blood pressure monitoring; Proper BP monitoring technique; -Counseled to monitor BP at home daily, document, and provide log at future appointments -Counseled on diet and exercise extensively Recommended to continue current medication   Hyperlipidemia: (LDL goal < 55) -not ideally controlled -Current treatment: Atorvastatin 10 mg daily  -Medications previously tried: pravastatin, Lovaza -Current dietary patterns: reports eating at night. Loves eggs.  -Current exercise habits: none reported -Educated on Cholesterol goals;  Benefits of statin for ASCVD risk reduction; Importance of limiting foods high in cholesterol; Exercise goal of 150 minutes per week; -Counseled on diet and exercise extensively Recommended to continue current medication Recommended considering increase in atorvastatin if LDL not improved at next blood work.   Diabetes (A1c goal <7%) -uncontrolled -Current medications: metformin 1000 mg daily  Farxiga 10 mg daily  Trulicity 9.67 mg weekly   -Medications previously tried: none reported  -Current home glucose readings fasting glucose: ~130 mg/dL post prandial glucose: not reported -Denies hypoglycemic/hyperglycemic symptoms -Current meal patterns:  Patient reports eating during the night.  Not eating well since cardioversion.  -Current exercise: none reported -Educated onA1c and blood sugar goals; Complications of diabetes including kidney damage, retinal damage, and cardiovascular disease; Benefits  of weight loss; Benefits of routine self-monitoring of blood sugar; -Counseled to check feet daily and get yearly eye exams -Counseled on diet and exercise extensively Recommended continuing metformin, Farxiga and Trulicity as  prescribed for now.   Atrial Fibrillation (Goal: prevent stroke and major bleeding) -controlled  -CHADSVASC: 6  -Current treatment: Rate control: metoprolol xl 25 mg bid, flecanide 100 mg bid Anticoagulation: Xarelto 20 mg daily  -Medications previously tried: Eliquis  -Home BP and HR readings: none reported  -Counseled on increased risk of stroke due to Afib and benefits of anticoagulation for stroke prevention; importance of adherence to anticoagulant exactly as prescribed; avoidance of NSAIDs due to increased bleeding risk with anticoagulants; -Counseled on diet and exercise extensively Recommended to continue current medication Patient may be eligible for Xarelto patient assistance once 4% of household income has been spent on medications for 2022.   Osteoporosis / Osteopenia (Goal  Minimize risk of break or fracture) -controlled -Last DEXA Scan: not available in chart    -Current treatment  calcium/vitamin D/vitamin k chew daily  Vitamin D 50,000 units weekly on Monday  -Medications previously tried: none reported  -Recommend [phone] units of vitamin D daily. Recommend 1200 mg of calcium daily from dietary and supplemental sources. Recommend weight-bearing and muscle strengthening exercises for building and maintaining bone density. -Counseled on diet and exercise extensively Recommended to continue current medication  Depression (Goal: manage symptoms of depression) -not ideally controlled -Current treatment  Trintellix 20 mg daily  Rexulti 0.5 mg daily  -Medications previously tried: states she has tried other medications in the past but has been on venlafaxine of and on for 30 years, venlafaxine -Coordinated application for Rexulti and provided samples. Patient assistance approved.   Insomnia (Goal: improve sleep) -controlled -Current treatment  Zolpidem 10 mg at bedtime prn sleep  -Medications previously tried: melatonin, mirtazapine -Counseled on good sleep  hygeine.   Health Maintenance -Vaccine gaps: Shingles and Tetanus -Current therapy:  Multivitamin daily  Probiotic daily  Zinc Sulfate daily  Epipen prn  Vitamin B-12 1000 mcg/ml every 30 days Biotin 1000 mcg daily  B complex-C-folic acid daily  Lutein 20 mg daily  -Educated on Cost vs benefit of each product must be carefully weighed by individual consumer -Patient is satisfied with current therapy and denies issues -Recommended to continue current medication   Patient Goals/Self-Care Activities Over the next 90 days, patient will:  - take medications as prescribed focus on medication adherence by using pill box check glucose daily , document, and provide at future appointments check blood pressure weekly, document, and provide at future appointments  Follow Up Plan: Telephone follow up appointment with care management team member scheduled for: 06/2021       Medication Assistance: Application for Trulicity and Farxiga  medication assistance program. in process.  Anticipated assistance start date 12/29/2020.  See plan of care for additional detail. Patient currently approved for Trintellix through 07/16/2021. Application for Rexulti in process.   Patient's preferred pharmacy is:  [facility], [place] [facility] [place] [address] Phone: [phone] Fax: [place], [place] [address] [place] [address] Phone: [phone] Fax: [place], [place] [address] [place] [address] Phone: [phone] Fax: [phone]  Uses pill box?  Yes Pt endorses good compliance  We discussed: Benefits of medication synchronization, packaging and delivery as well as enhanced pharmacist oversight with [employer]. Patient decided to: Continue  current medication management strategy  Care Plan and Follow Up Patient Decision:  Patient agrees to Care Plan and Follow-up.  Plan: Telephone follow up appointment with care management team member scheduled for:  6 months

## 2021-01-03 ENCOUNTER — Ambulatory Visit (INDEPENDENT_AMBULATORY_CARE_PROVIDER_SITE_OTHER): Payer: Medicare Other | Admitting: Cardiology

## 2021-01-03 ENCOUNTER — Encounter: Payer: Self-pay | Admitting: Cardiology

## 2021-01-03 ENCOUNTER — Other Ambulatory Visit: Payer: Self-pay

## 2021-01-03 VITALS — BP 130/70 | HR 68 | Ht 68.0 in | Wt 183.0 lb

## 2021-01-03 DIAGNOSIS — I4819 Other persistent atrial fibrillation: Secondary | ICD-10-CM | POA: Diagnosis not present

## 2021-01-03 NOTE — Patient Instructions (Signed)
Medication Instructions:  Your physician recommends that you continue on your current medications as directed. Please refer to the Current Medication list given to you today.  *If you need a refill on your cardiac medications before your next appointment, please call your pharmacy*   Lab Work: None ordered  Testing/Procedures: None ordered   Follow-Up: At [facility], you and your health needs are our priority.  As part of our continuing mission to provide you with exceptional heart care, we have created designated Provider Care Teams.  These Care Teams include your primary Cardiologist (physician) and Advanced Practice Providers (APPs -  Physician Assistants and Nurse Practitioners) who all work together to provide you with the care you need, when you need it.  We recommend signing up for the patient portal called "MyChart".  Sign up information is provided on this After Visit Summary.  MyChart is used to connect with patients for Virtual Visits (Telemedicine).  Patients are able to view lab/test results, encounter notes, upcoming appointments, etc.  Non-urgent messages can be sent to your provider as well.   To learn more about what you can do with MyChart, go to [email or web address].    Your next appointment:   3 month(s)  The format for your next appointment:   In Person  Provider:   Allegra Lai, MD    Thank you for choosing [facility]!!   Trinidad Curet, RN [phone]

## 2021-01-03 NOTE — Progress Notes (Signed)
Electrophysiology Office Note   Date:  01/03/2021   ID:  April Munoz, DOB [birth year], MRN [id number]  PCP:  [name], MD  Cardiologist: Bettina Gavia Primary Electrophysiologist:  Constance Haw, MD    No chief complaint on file.    History of Present Illness: April Munoz is a 70 y.o. female who is being seen today for the evaluation of atrial fibrillation at the request of April Munoz, April Maxwell, MD. Presenting today for electrophysiology evaluation.  She has a history significant for persistent atrial fibrillation, depression, diabetes, hypertension, hyperlipidemia, TIA, and atrial flutter.  She wore a 30-day monitor that showed almost entirely atrial fibrillation.  She is status post ablation 03/21/2019.  She presented back to clinic with typical atrial flutter and is now status post ablation 05/29/2020.April Munoz  She has come back to clinic in a left atrial flutter.She is now status post cardioversion 04/28/2021.  Today, denies symptoms of palpitations, chest pain, shortness of breath, orthopnea, PND, lower extremity edema, claudication, dizziness, presyncope, syncope, bleeding, or neurologic sequela. The patient is tolerating medications without difficulties.  Since her cardioversion she has done well.  She has had short episodes of atrial fibrillation, which she feels like they are overall fairly well controlled with flecainide.  Unfortunately, her husband has recently been diagnosed with bladder cancer and they are currently organizing his care.   Past Medical History:  Diagnosis Date   Anxiety    Arthritis    Ataxic gait    Atrial fibrillation (HCC)    Atrophy of thyroid    B12 deficiency 10/31/2017   Cellulitis of abdominal wall 07/07/2016   Chronic anticoagulation 05/31/2018   Depression    Depression, major, recurrent, mild ([place]) 09/16/2019   Diabetes mellitus without complication ([place])    Diabetic glomerulopathy ([place]) 09/16/2019   Esophageal stricture 06/05/2017   Essential hypertension  02/24/2013   Fatigue 01/20/2020   Fibromyalgia 02/24/2013   Full dentures    Hereditary and idiopathic peripheral neuropathy 02/24/2013   High risk medication use 11/07/2017   Hyperlipidemia 02/24/2013   Hypertension    Hypertensive heart disease 02/24/2013   Incontinence    Insomnia    Longstanding persistent atrial fibrillation ([place]) 09/16/2019   Mild vitamin D deficiency 09/16/2019   Mixed hyperlipidemia 02/24/2013   Murmur, cardiac 08/15/2015   Neuropathy in diabetes ([place]) 10/31/2017   Osteoporosis    Other amnesia    Other amnesia    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    Paroxysmal atrial fibrillation ([place]) 08/15/2015   CHADS2vasc=3 CHADS2vasc=3   Primary insomnia    Restless legs syndrome 03/14/2013   Secondary hypothyroidism 09/16/2019   Thoracic or lumbosacral neuritis or radiculitis 02/24/2013   Type 2 diabetes mellitus, without long-term current use of insulin ([place]) 02/24/2013   Urge incontinence of urine 09/16/2019   Wears glasses    Past Surgical History:  Procedure Laterality Date   A-FLUTTER ABLATION N/A 07/30/2019   Procedure: A-FLUTTER ABLATION;  Surgeon: Constance Haw, MD;  Location: [facility];  Service: Cardiovascular;  Laterality: N/A;   ATRIAL FIBRILLATION ABLATION N/A 03/21/2019   Procedure: ATRIAL FIBRILLATION ABLATION;  Surgeon: Constance Haw, MD;  Location: [facility];  Service: Cardiovascular;  Laterality: N/A;   [place] STUDY  11/26/2020   Procedure: BUBBLE STUDY;  Surgeon: Pixie Casino, MD;  Location: [facility];  Service: Cardiovascular;;   CARDIOVERSION N/A 11/26/2020   Procedure: CARDIOVERSION;  Surgeon: Pixie Casino,  MD;  Location: [facility];  Service: Cardiovascular;  Laterality: N/A;   CATARACT EXTRACTION, BILATERAL  2018, 2019   CHOLECYSTECTOMY  1971   open   DIAGNOSTIC LAPAROSCOPY  2010   lysis of adhesions   DILATION AND CURETTAGE OF UTERUS  2004   KNEE  ARTHROSCOPY  1998   left   KNEE ARTHROSCOPY W/ LATERAL RETINACULAR REPAIR     MASS EXCISION Left 11/11/2013   Procedure: EXCISION MUCOID CYST LEFT INDEX FINGER/DEBRIDEMENT LEFT INDEX FINGER;  Surgeon: Wynonia Sours, MD;  Location: [facility];  Service: Orthopedics;  Laterality: Left;  ANESTHESIA: IV REGIONAL/FAB   SHOULDER ARTHROSCOPY W/ ROTATOR CUFF REPAIR  2007   left   TEE WITHOUT CARDIOVERSION N/A 11/26/2020   Procedure: TRANSESOPHAGEAL ECHOCARDIOGRAM (TEE);  Surgeon: Pixie Casino, MD;  Location: [facility];  Service: Cardiovascular;  Laterality: N/A;   TRIGGER FINGER RELEASE Left 11/11/2013   Procedure: RELEASE A-1 PULLEY LEFT RING FINGER;  Surgeon: Wynonia Sours, MD;  Location: [facility];  Service: Orthopedics;  Laterality: Left;   UMBILICAL HERNIA REPAIR  2008, 2010     Current Outpatient Medications  Medication Sig Dispense Refill   acyclovir (ZOVIRAX) 800 MG tablet TAKE ONE (1) TABLET FOUR (4) TIMES DAILYAS NEEDED FOR SHINGLES (Patient taking differently: Take 800 mg by mouth daily as needed (Shingles).) 120 tablet 0   atorvastatin (LIPITOR) 10 MG tablet Take 10 mg by mouth daily.     B Complex-C-Folic Acid TABS Take 1 tablet by mouth daily.     Brexpiprazole (REXULTI) 0.5 MG TABS Take 1 tablet (0.5 mg total) by mouth daily. 30 tablet 0   Calcium Carb-Cholecalciferol (LIQUID CALCIUM WITH D3) [phone] MG-UNIT CAPS Take 600 mg by mouth daily.     Calcium-Vitamin D-Vitamin K (VIACTIV CALCIUM PLUS D) 650-12.5-40 MG-MCG-MCG CHEW Chew 1 capsule by mouth daily.     Coenzyme Q10 100 MG TABS Take 1 tablet by mouth daily.     Continuous Blood Gluc Receiver (FREESTYLE LIBRE 2 READER) DEVI E11.21 Check blood sugar 4 times daily as directed 1 each 0   Continuous Blood Gluc Sensor (FREESTYLE LIBRE 2 SENSOR) MISC E11.21 Change sensor every 14 days as directed 6 each 3   cyanocobalamin (,VITAMIN B-12,) 1000 MCG/ML injection Inject 1 mL (1,000 mcg total) into the  muscle every 30 (thirty) days. 1 mL 2   dapagliflozin propanediol (FARXIGA) 10 MG TABS tablet Take 10 mg by mouth daily.     Dulaglutide (TRULICITY) 0.97 DZ/3.2DJ SOPN Inject 0.75 mg into the skin once a week. 6 mL 0   EPINEPHrine 0.3 mg/0.3 mL IJ SOAJ injection Inject 0.3 mg into the muscle as needed. 1 each 1   flecainide (TAMBOCOR) 100 MG tablet Take 1 tablet (100 mg total) by mouth 2 (two) times daily. 60 tablet 3   levothyroxine (SYNTHROID) 75 MCG tablet Take 75 mcg by mouth daily before breakfast.     levothyroxine (SYNTHROID) 88 MCG tablet Take 1 tablet (88 mcg total) by mouth daily before breakfast. 90 tablet 0   losartan (COZAAR) 50 MG tablet Take 1 tablet (50 mg total) by mouth 2 (two) times daily. (Patient taking differently: Take 50 mg by mouth daily.) 1 tablet 0   Lutein 20 MG TABS Take 1 tablet by mouth daily.     Melatonin 10 MG TABS Take 10 mg by mouth 4 (four) times a week.     metFORMIN (GLUCOPHAGE) 1000 MG tablet Take 1,000 mg by mouth daily.  metoprolol succinate (TOPROL-XL) 25 MG 24 hr tablet TAKE 1 TABLET BY MOUTH IN THE MORNING AND 1 TABLET AT BEDTIME. (Patient taking differently: Take 25 mg by mouth 2 (two) times daily.) 60 tablet 11   Multiple Vitamin (MULTIVITAMIN WITH MINERALS) TABS tablet Take 1 tablet by mouth daily.     omega-3 acid ethyl esters (LOVAZA) 1 g capsule Take 2 capsules by mouth 2 (two) times daily.     omeprazole (PRILOSEC) 20 MG capsule TAKE ONE (1) CAPSULE EACH DAY 90 capsule 0   Probiotic Product (PROBIOTIC & ACIDOPHILUS EX ST PO) Take 1 tablet by mouth daily.      rivaroxaban (XARELTO) 20 MG TABS tablet Take 1 tablet (20 mg total) by mouth daily with supper. 30 tablet 3   torsemide (DEMADEX) 20 MG tablet Take 1 tablet (20 mg total) by mouth 3 (three) times a week. Please take this on Monday, Wednesday, and Friday. (Patient taking differently: Take 20 mg by mouth every Monday, Wednesday, and Friday.) 90 tablet 0   Vitamin D, Ergocalciferol, (DRISDOL)  1.25 MG (50000 UNIT) CAPS capsule TAKE 1 CAPSULE BY MOUTH EVERY MONDAY (Patient taking differently: Take 50,000 Units by mouth every 7 (seven) days.) 12 capsule 1   vortioxetine HBr (TRINTELLIX) 20 MG TABS tablet Take 20 mg by mouth daily.     Zinc Sulfate (ZINC 15 PO) Take 150 mg by mouth 3 (three) times a week.     zolpidem (AMBIEN) 10 MG tablet TAKE 1 TABLET BY MOUTH AT BEDTIME AS NEEDED FOR SLEEP (Patient taking differently: Take 10 mg by mouth at bedtime as needed for sleep.) 30 tablet 3   No current facility-administered medications for this visit.    Allergies:   Prednisone   Social History:  The patient  reports that she has never smoked. She has never used smokeless tobacco. She reports current alcohol use of about 3.0 - 4.0 standard drinks of alcohol per week. She reports that she does not use drugs.   Family History:  The patient's family history includes Atrial fibrillation in her mother; Diabetes in her maternal grandmother; Heart attack in her brother and mother; Heart disease in her brother; Heart failure in her father and paternal grandfather; Stroke in her paternal grandfather.   ROS:  Please see the history of present illness.   Otherwise, review of systems is positive for none.   All other systems are reviewed and negative.   PHYSICAL EXAM: VS:  BP 130/70   Pulse 68   Ht 5\' 8"  (1.727 m)   Wt 183 lb (83 kg)   BMI 27.83 kg/m  , BMI Body mass index is 27.83 kg/m. GEN: Well nourished, well developed, in no acute distress  HEENT: normal  Neck: no JVD, carotid bruits, or masses Cardiac: RRR; no murmurs, rubs, or gallops,no edema  Respiratory:  clear to auscultation bilaterally, normal work of breathing GI: soft, nontender, nondistended, + BS MS: no deformity or atrophy  Skin: warm and dry Neuro:  Strength and sensation are intact Psych: euthymic mood, full affect  EKG:  EKG is ordered today. Personal review of the ekg ordered shows sinus rhythm, heart rate  60  Recent Labs: 01/20/2020: Magnesium 1.6 05/31/2020: TSH 5.250 09/14/2020: ALT 42 11/02/2020: Platelets 280 11/26/2020: BUN 21; Creatinine, Ser 0.90; Hemoglobin 12.9; Potassium 4.2; Sodium 141    Lipid Panel     Component Value Date/Time   CHOL 156 09/03/2020 0953   TRIG 153 (H) 09/03/2020 0953   HDL 57 09/03/2020  0953   CHOLHDL 2.7 09/03/2020 0953   LDLCALC 73 09/03/2020 0953     Wt Readings from Last 3 Encounters:  01/03/21 183 lb (83 kg)  12/14/20 191 lb 9.6 oz (86.9 kg)  11/26/20 190 lb (86.2 kg)      Other studies Reviewed: Additional studies/ records that were reviewed today include: Monitor 01/29/2019 personally reviewed Review of the above records today demonstrates:  1. atrial fibrillation with good heart rate control 2.  Rare ventricular ectopy  TTE 11/08/2020 Ejection fraction 55 to 60% Normal right ventricular size and function Left atrium normal in size Right atrium normal in size Aortic valve trileaflet structurally normal  Normal-appearing mitral valve  TEE 11/26/20  1. Left ventricular ejection fraction, by estimation, is 55 to 60%. The  left ventricle has normal function.   2. Right ventricular systolic function is normal. The right ventricular  size is normal.   3. Left atrial size was mildly dilated. No left atrial/left atrial  appendage thrombus was detected.   4. The mitral valve is grossly normal. Trivial mitral valve  regurgitation.   5. The aortic valve is tricuspid. Aortic valve regurgitation is trivial.   6. Agitated saline contrast bubble study was negative, with no evidence  of any interatrial shunt.   ASSESSMENT AND PLAN:  1.  Persistent atrial fibrillation: Currently on Eliquis and flecainide.  High risk medication monitoring.  Status post ablation 03/21/2019.  CHA2DS2-VASc of 6.  She is unfortunately had more frequent episodes of atypical atrial flutter and is now status post cardioversion.  She has had minimal episodes of atrial  fibrillation.  She is overall comfortable with her control.  She would like to consider further therapies and we Vana Arif discuss that at her next appointment.  2.  Hypertension: Currently well controlled  3.  Typical atrial flutter: Status post ablation 05/29/2020.   Current medicines are reviewed at length with the patient today.   The patient does not have concerns regarding her medicines.  The following changes were made today: None  Labs/ tests ordered today include:  Orders Placed This Encounter  Procedures   EKG 12-Lead     Disposition:   FU with Staceyann Knouff 3 months  Signed, Lashawn Bromwell Meredith Leeds, MD  01/03/2021 3:29 PM     [facility] [address] [place] [address] [phone] (office) [phone] (fax)

## 2021-01-05 ENCOUNTER — Telehealth: Payer: Self-pay | Admitting: Cardiology

## 2021-01-05 DIAGNOSIS — E119 Type 2 diabetes mellitus without complications: Secondary | ICD-10-CM | POA: Diagnosis not present

## 2021-01-05 DIAGNOSIS — M5383 Other specified dorsopathies, cervicothoracic region: Secondary | ICD-10-CM | POA: Diagnosis not present

## 2021-01-05 DIAGNOSIS — R293 Abnormal posture: Secondary | ICD-10-CM | POA: Diagnosis not present

## 2021-01-05 DIAGNOSIS — M9901 Segmental and somatic dysfunction of cervical region: Secondary | ICD-10-CM | POA: Diagnosis not present

## 2021-01-05 DIAGNOSIS — M2041 Other hammer toe(s) (acquired), right foot: Secondary | ICD-10-CM | POA: Diagnosis not present

## 2021-01-05 DIAGNOSIS — M9902 Segmental and somatic dysfunction of thoracic region: Secondary | ICD-10-CM | POA: Diagnosis not present

## 2021-01-05 DIAGNOSIS — M546 Pain in thoracic spine: Secondary | ICD-10-CM | POA: Diagnosis not present

## 2021-01-05 DIAGNOSIS — M2011 Hallux valgus (acquired), right foot: Secondary | ICD-10-CM | POA: Diagnosis not present

## 2021-01-05 DIAGNOSIS — M9903 Segmental and somatic dysfunction of lumbar region: Secondary | ICD-10-CM | POA: Diagnosis not present

## 2021-01-05 DIAGNOSIS — M2012 Hallux valgus (acquired), left foot: Secondary | ICD-10-CM | POA: Diagnosis not present

## 2021-01-05 DIAGNOSIS — M2042 Other hammer toe(s) (acquired), left foot: Secondary | ICD-10-CM | POA: Diagnosis not present

## 2021-01-07 NOTE — Patient Instructions (Signed)
Visit Information   Goals Addressed   None    Patient Care Plan: CCM Pharmacy Care Plan     Problem Identified: afib, diabetes, hypertension, hyperlipidemia   Priority: High  Onset Date: 08/30/2020     Long-Range Goal: Disease Management   Start Date: 08/30/2020  Expected End Date: 08/30/2021  Recent Progress: On track  Priority: High  Note:   Current Barriers:  Unable to independently afford treatment regimen Unable to maintain control of diabetes  Pharmacist Clinical Goal(s):  Over the next 90 days, patient will verbalize ability to afford treatment regimen achieve control of diabetes as evidenced by a1c through collaboration with PharmD and provider.   Interventions: 1:1 collaboration with Cox, Elnita Maxwell, MD regarding development and update of comprehensive plan of care as evidenced by provider attestation and co-signature Inter-disciplinary care team collaboration (see longitudinal plan of care) Comprehensive medication review performed; medication list updated in electronic medical record  Hypertension (BP goal <130/80) -controlled -Current treatment: Losartan 50 mg daily Metoprolol XL 25 mg bid Torsemide 20 mg three times weekly  -Medications previously tried: none reported  -Current home readings: ~140/80  -Current dietary habits: reports eating during the night some.  -Current exercise habits: none reported  -Denies hypotensive/hypertensive symptoms -Educated on BP goals and benefits of medications for prevention of heart attack, stroke and kidney damage; Daily salt intake goal < 2300 mg; Exercise goal of 150 minutes per week; Importance of home blood pressure monitoring; Proper BP monitoring technique; -Counseled to monitor BP at home daily, document, and provide log at future appointments -Counseled on diet and exercise extensively Recommended to continue current medication   Hyperlipidemia: (LDL goal < 55) -not ideally controlled -Current  treatment: Atorvastatin 10 mg daily  -Medications previously tried: pravastatin, Lovaza -Current dietary patterns: reports eating at night. Loves eggs.  -Current exercise habits: none reported -Educated on Cholesterol goals;  Benefits of statin for ASCVD risk reduction; Importance of limiting foods high in cholesterol; Exercise goal of 150 minutes per week; -Counseled on diet and exercise extensively Recommended to continue current medication Recommended considering increase in atorvastatin if LDL not improved at next blood work.   Diabetes (A1c goal <7%) -uncontrolled -Current medications: metformin 1000 mg daily  Farxiga 10 mg daily  Trulicity 3.22 mg weekly   -Medications previously tried: none reported  -Current home glucose readings fasting glucose: 161 post prandial glucose: 265 -Denies hypoglycemic/hyperglycemic symptoms -Current meal patterns:  Patient reports eating during the night.  Not eating well since cardioversion.  -Current exercise: none reported -Educated onA1c and blood sugar goals; Complications of diabetes including kidney damage, retinal damage, and cardiovascular disease; Benefits of weight loss; Benefits of routine self-monitoring of blood sugar; -Counseled to check feet daily and get yearly eye exams -Counseled on diet and exercise extensively Recommended increasing Farxiga 10 mg daily. Begin taking metformin 1000 mg daily. Will consider increase in Trulicity at next visit if blood sugar remains above goal. Patient follows-up with GI in  June and has a history of stomach issues. Choosing to increase Farxiga first to avoid GI adverse events.   Atrial Fibrillation (Goal: prevent stroke and major bleeding) -controlled  -CHADSVASC: 6  -Current treatment: Rate control: metoprolol xl 25 mg bid, flecanide 100 mg bid Anticoagulation: Xarelto 20 mg daily  -Medications previously tried: Eliquis  -Home BP and HR readings: none reported  -Counseled on  increased risk of stroke due to Afib and benefits of anticoagulation for stroke prevention; importance of adherence to anticoagulant exactly as prescribed; avoidance  of NSAIDs due to increased bleeding risk with anticoagulants; -Counseled on diet and exercise extensively Recommended to continue current medication Patient may be eligible for Xarelto patient assistance once 4% of household income has been spent on medications for 2022.   Osteoporosis / Osteopenia (Goal  Minimize risk of break or fracture) -controlled -Last DEXA Scan: not available in chart    -Current treatment  calcium/vitamin D/vitamin k chew daily  Vitamin D 50,000 units weekly on Monday  -Medications previously tried: none reported  -Recommend [phone] units of vitamin D daily. Recommend 1200 mg of calcium daily from dietary and supplemental sources. Recommend weight-bearing and muscle strengthening exercises for building and maintaining bone density. -Counseled on diet and exercise extensively Recommended to continue current medication  Depression (Goal: manage symptoms of depression) -not ideally controlled -Current treatment  Trintellix 20 mg daily  Rexulti 0.5 mg daily  -Medications previously tried: states she has tried other medications in the past but has been on venlafaxine of and on for 30 years, venlafaxine -Coordinated application for Rexulti and provided samples.   Insomnia (Goal: improve sleep) -controlled -Current treatment  Zolpidem 10 mg at bedtime prn sleep  -Medications previously tried: melatonin, mirtazapine -Counseled on good sleep hygeine.   Health Maintenance -Vaccine gaps: Shingles and Tetanus -Current therapy:  Multivitamin daily  Probiotic daily  Zinc Sulfate daily  Epipen prn  Vitamin B-12 1000 mcg/ml every 30 days Biotin 1000 mcg daily  B complex-C-folic acid daily  Lutein 20 mg daily  -Educated on Cost vs benefit of each product must be carefully weighed by individual  consumer -Patient is satisfied with current therapy and denies issues -Recommended to continue current medication   Patient Goals/Self-Care Activities Over the next 90 days, patient will:  - take medications as prescribed focus on medication adherence by using pill box check glucose daily , document, and provide at future appointments check blood pressure weekly, document, and provide at future appointments  Follow Up Plan: Telephone follow up appointment with care management team member scheduled for: 06/2021      The patient verbalized understanding of instructions, educational materials, and care plan provided today and declined offer to receive copy of patient instructions, educational materials, and care plan.  Telephone follow up appointment with pharmacy team member scheduled for: 06/2021  Burnice Logan, [facility]

## 2021-01-11 DIAGNOSIS — D122 Benign neoplasm of ascending colon: Secondary | ICD-10-CM | POA: Diagnosis not present

## 2021-01-11 DIAGNOSIS — R1310 Dysphagia, unspecified: Secondary | ICD-10-CM | POA: Diagnosis not present

## 2021-01-11 DIAGNOSIS — F32A Depression, unspecified: Secondary | ICD-10-CM | POA: Diagnosis not present

## 2021-01-11 DIAGNOSIS — K5730 Diverticulosis of large intestine without perforation or abscess without bleeding: Secondary | ICD-10-CM | POA: Diagnosis not present

## 2021-01-11 DIAGNOSIS — Z79899 Other long term (current) drug therapy: Secondary | ICD-10-CM | POA: Diagnosis not present

## 2021-01-11 DIAGNOSIS — K219 Gastro-esophageal reflux disease without esophagitis: Secondary | ICD-10-CM | POA: Diagnosis not present

## 2021-01-11 DIAGNOSIS — Z7984 Long term (current) use of oral hypoglycemic drugs: Secondary | ICD-10-CM | POA: Diagnosis not present

## 2021-01-11 DIAGNOSIS — E039 Hypothyroidism, unspecified: Secondary | ICD-10-CM | POA: Diagnosis not present

## 2021-01-11 DIAGNOSIS — K624 Stenosis of anus and rectum: Secondary | ICD-10-CM | POA: Diagnosis not present

## 2021-01-11 DIAGNOSIS — K644 Residual hemorrhoidal skin tags: Secondary | ICD-10-CM | POA: Diagnosis not present

## 2021-01-11 DIAGNOSIS — K449 Diaphragmatic hernia without obstruction or gangrene: Secondary | ICD-10-CM | POA: Diagnosis not present

## 2021-01-11 DIAGNOSIS — E119 Type 2 diabetes mellitus without complications: Secondary | ICD-10-CM | POA: Diagnosis not present

## 2021-01-11 DIAGNOSIS — I4891 Unspecified atrial fibrillation: Secondary | ICD-10-CM | POA: Diagnosis not present

## 2021-01-11 DIAGNOSIS — Z9884 Bariatric surgery status: Secondary | ICD-10-CM | POA: Diagnosis not present

## 2021-01-11 DIAGNOSIS — K648 Other hemorrhoids: Secondary | ICD-10-CM | POA: Diagnosis not present

## 2021-01-11 DIAGNOSIS — D126 Benign neoplasm of colon, unspecified: Secondary | ICD-10-CM | POA: Diagnosis not present

## 2021-01-11 DIAGNOSIS — D649 Anemia, unspecified: Secondary | ICD-10-CM | POA: Diagnosis not present

## 2021-01-11 DIAGNOSIS — K2270 Barrett's esophagus without dysplasia: Secondary | ICD-10-CM | POA: Diagnosis not present

## 2021-01-11 DIAGNOSIS — Z1211 Encounter for screening for malignant neoplasm of colon: Secondary | ICD-10-CM | POA: Diagnosis not present

## 2021-01-11 LAB — HM COLONOSCOPY

## 2021-01-13 ENCOUNTER — Telehealth: Payer: Self-pay

## 2021-01-13 DIAGNOSIS — M9902 Segmental and somatic dysfunction of thoracic region: Secondary | ICD-10-CM | POA: Diagnosis not present

## 2021-01-13 DIAGNOSIS — M9901 Segmental and somatic dysfunction of cervical region: Secondary | ICD-10-CM | POA: Diagnosis not present

## 2021-01-13 DIAGNOSIS — M5383 Other specified dorsopathies, cervicothoracic region: Secondary | ICD-10-CM | POA: Diagnosis not present

## 2021-01-13 DIAGNOSIS — M546 Pain in thoracic spine: Secondary | ICD-10-CM | POA: Diagnosis not present

## 2021-01-13 DIAGNOSIS — R293 Abnormal posture: Secondary | ICD-10-CM | POA: Diagnosis not present

## 2021-01-13 DIAGNOSIS — M9903 Segmental and somatic dysfunction of lumbar region: Secondary | ICD-10-CM | POA: Diagnosis not present

## 2021-01-13 NOTE — Progress Notes (Signed)
Chronic Care Management Pharmacy Assistant   Name: April Munoz  MRN: [id number] DOB: [birth year]   Reason for Encounter: Medication Review for Rexulti approval     Medications: Outpatient Encounter Medications as of 01/13/2021  Medication Sig Note   acyclovir (ZOVIRAX) 800 MG tablet TAKE ONE (1) TABLET FOUR (4) TIMES DAILYAS NEEDED FOR SHINGLES (Patient taking differently: Take 800 mg by mouth daily as needed (Shingles).)    atorvastatin (LIPITOR) 10 MG tablet Take 10 mg by mouth daily.    B Complex-C-Folic Acid TABS Take 1 tablet by mouth daily.    Brexpiprazole (REXULTI) 0.5 MG TABS Take 1 tablet (0.5 mg total) by mouth daily.    Calcium Carb-Cholecalciferol (LIQUID CALCIUM WITH D3) [phone] MG-UNIT CAPS Take 600 mg by mouth daily.    Calcium-Vitamin D-Vitamin K (VIACTIV CALCIUM PLUS D) 650-12.5-40 MG-MCG-MCG CHEW Chew 1 capsule by mouth daily.    Coenzyme Q10 100 MG TABS Take 1 tablet by mouth daily.    Continuous Blood Gluc Receiver (FREESTYLE LIBRE 2 READER) DEVI E11.21 Check blood sugar 4 times daily as directed    Continuous Blood Gluc Sensor (FREESTYLE LIBRE 2 SENSOR) MISC E11.21 Change sensor every 14 days as directed    cyanocobalamin (,VITAMIN B-12,) 1000 MCG/ML injection Inject 1 mL (1,000 mcg total) into the muscle every 30 (thirty) days.    dapagliflozin propanediol (FARXIGA) 10 MG TABS tablet Take 10 mg by mouth daily.    Dulaglutide (TRULICITY) 3.78 HY/8.5OY SOPN Inject 0.75 mg into the skin once a week.    EPINEPHrine 0.3 mg/0.3 mL IJ SOAJ injection Inject 0.3 mg into the muscle as needed. 11/22/2020: Pharmacy is out at this time   flecainide (TAMBOCOR) 100 MG tablet Take 1 tablet (100 mg total) by mouth 2 (two) times daily.    levothyroxine (SYNTHROID) 75 MCG tablet Take 75 mcg by mouth daily before breakfast.    levothyroxine (SYNTHROID) 88 MCG tablet Take 1 tablet (88 mcg total) by mouth daily before breakfast. (Patient not taking: Reported on 01/07/2021)    losartan  (COZAAR) 50 MG tablet Take 1 tablet (50 mg total) by mouth 2 (two) times daily. (Patient taking differently: Take 50 mg by mouth daily.) 12/02/2020: Once daily   Lutein 20 MG TABS Take 1 tablet by mouth daily.    Melatonin 10 MG TABS Take 10 mg by mouth 4 (four) times a week.    metFORMIN (GLUCOPHAGE) 1000 MG tablet Take 1,000 mg by mouth daily.    metoprolol succinate (TOPROL-XL) 25 MG 24 hr tablet TAKE 1 TABLET BY MOUTH IN THE MORNING AND 1 TABLET AT BEDTIME. (Patient taking differently: Take 25 mg by mouth 2 (two) times daily.)    Multiple Vitamin (MULTIVITAMIN WITH MINERALS) TABS tablet Take 1 tablet by mouth daily.    omega-3 acid ethyl esters (LOVAZA) 1 g capsule Take 2 capsules by mouth 2 (two) times daily.    omeprazole (PRILOSEC) 20 MG capsule TAKE ONE (1) CAPSULE EACH DAY    Probiotic Product (PROBIOTIC & ACIDOPHILUS EX ST PO) Take 1 tablet by mouth daily.     rivaroxaban (XARELTO) 20 MG TABS tablet Take 1 tablet (20 mg total) by mouth daily with supper.    torsemide (DEMADEX) 20 MG tablet Take 1 tablet (20 mg total) by mouth 3 (three) times a week. Please take this on Monday, Wednesday, and Friday. (Patient taking differently: Take 20 mg by mouth every Monday, Wednesday, and Friday.)    Vitamin D, Ergocalciferol, (DRISDOL) 1.25 MG (50000  UNIT) CAPS capsule TAKE 1 CAPSULE BY MOUTH EVERY MONDAY (Patient taking differently: Take 50,000 Units by mouth every 7 (seven) days.)    vortioxetine HBr (TRINTELLIX) 20 MG TABS tablet Take 20 mg by mouth daily. 12/02/2020: Recommended dose increase to 20 mg daily. Approved by Dr. Tobie Poet.    Zinc Sulfate (ZINC 15 PO) Take 150 mg by mouth 3 (three) times a week.    zolpidem (AMBIEN) 10 MG tablet TAKE 1 TABLET BY MOUTH AT BEDTIME AS NEEDED FOR SLEEP (Patient taking differently: Take 10 mg by mouth at bedtime as needed for sleep.)    No facility-administered encounter medications on file as of 01/13/2021.   [name] CPP asked me to call and inform the patient  that her Rexulti had been approved.  She wanted to also make sure it is helping the patient.  I called patient, she has been doing gel shots in her knee,  She said the Rexulti seems to be helping with controlling things, she is going to watch more.  She was very happy that we were able to help her.  I told her usually that will arrive in 7-14 days depending on shipping.  She stated she may want to switch over to [facility] for her medications, she will let me know.  She stated he plate has been full lately, she stated that [name] has been diagnosed with lung cancer, there first oncology appointment is tomorrow.  [name], [place] Pharmacist Assistant [phone]

## 2021-01-19 DIAGNOSIS — M1712 Unilateral primary osteoarthritis, left knee: Secondary | ICD-10-CM | POA: Diagnosis not present

## 2021-01-25 ENCOUNTER — Other Ambulatory Visit: Payer: Self-pay | Admitting: Family Medicine

## 2021-01-25 NOTE — Telephone Encounter (Signed)
Please call pt and confirm pt is taking synthroid 88 mcg once daily in am.  She is also due for a tsh.

## 2021-01-26 DIAGNOSIS — M1712 Unilateral primary osteoarthritis, left knee: Secondary | ICD-10-CM | POA: Diagnosis not present

## 2021-01-26 NOTE — Telephone Encounter (Signed)
Called pt. Pt check bottle, she is taking 75 mcg. She has appointment 7/18 w/ Larene Beach she would like to get TSH drawn that day.   Harrell Lark 01/26/21 9:39 AM

## 2021-01-27 ENCOUNTER — Ambulatory Visit: Payer: Medicare Other | Admitting: Nurse Practitioner

## 2021-01-31 ENCOUNTER — Ambulatory Visit (INDEPENDENT_AMBULATORY_CARE_PROVIDER_SITE_OTHER): Payer: Medicare Other | Admitting: Nurse Practitioner

## 2021-01-31 ENCOUNTER — Encounter: Payer: Self-pay | Admitting: Nurse Practitioner

## 2021-01-31 ENCOUNTER — Other Ambulatory Visit: Payer: Self-pay

## 2021-01-31 VITALS — BP 152/72 | HR 62 | Temp 97.6°F | Ht 68.0 in | Wt 188.0 lb

## 2021-01-31 DIAGNOSIS — F330 Major depressive disorder, recurrent, mild: Secondary | ICD-10-CM | POA: Diagnosis not present

## 2021-01-31 DIAGNOSIS — E038 Other specified hypothyroidism: Secondary | ICD-10-CM

## 2021-01-31 DIAGNOSIS — F411 Generalized anxiety disorder: Secondary | ICD-10-CM

## 2021-01-31 MED ORDER — BREXPIPRAZOLE 1 MG PO TABS: 1.0000 mg | ORAL_TABLET | Freq: Every day | ORAL | 0 refills | Status: DC

## 2021-01-31 MED ORDER — BREXPIPRAZOLE 1 MG PO TABS
1.0000 mg | ORAL_TABLET | Freq: Every day | ORAL | 0 refills | Status: DC
Start: 2021-01-31 — End: 2021-01-31

## 2021-01-31 NOTE — Progress Notes (Signed)
Subjective:  Patient ID: April Munoz, female    DOB: 20-Aug-1950  Age: 70 y.o. MRN: [id number]  Chief Complaint  Patient presents with   [place] not working    HPI April Munoz is a 70 year old Caucasian female that presents for discussion of anxiety and depression medication. Current treatment is Rexulti 0.5 mg daily. She states symptoms are currently not well-controlled. She has experienced an increase in stress in her life. Her spouse ws recently diagnosed with lung cancer. He is currently undergoing chemotherapy treatment and is experiencing side effects. In addition, April Munoz tells me that her adult daughter that she adopted at the age of 9, recently left her spouse of several years and moved to [place] with a new partner that she met on the Internet. She is not currently in counseling but states she is willing to attend. April Munoz does have a past medical history of thyroid disease with current treatment of Levothyroxine 75 mcg. Last TSH 5.250 on 05/31/20.   Anxiety, Follow-up  She was last seen for anxiety 3 months ago. Changes made at last visit include Rexulti 0.39m.   She reports excellent compliance with treatment. She reports good tolerance of treatment. She is not having side effects.   She feels her anxiety is moderate and Unchanged since last visit.  Symptoms: No chest pain No difficulty concentrating  No dizziness Yes fatigue  No feelings of losing control Yes insomnia  No irritable No palpitations  No panic attacks Yes racing thoughts  No shortness of breath No sweating  No tremors/shakes    GAD-7 Results GAD-7 Generalized Anxiety Disorder Screening Tool 01/31/2021  1. Feeling Nervous, Anxious, or on Edge 3  2. Not Being Able to Stop or Control Worrying 3  3. Worrying Too Much About Different Things 3  4. Trouble Relaxing 3  5. Being So Restless it's Hard To Sit Still 3  6. Becoming Easily Annoyed or Irritable 0  7. Feeling Afraid As If Something Awful Might Happen 3  Total  GAD-7 Score 18  Difficulty At Work, Home, or Getting  Along With Others? Very difficult    PHQ-9 Scores PHQ9 SCORE ONLY 01/31/2021 12/14/2020 12/02/2020  PHQ-9 Total Score _0 Depression, Follow-up  She  was last seen for this 3 months ago. Changes made at last visit include Rexulti 0.5 mg daily.   She reports excellent compliance with treatment. She is not having side effects.   She reports excellent tolerance of treatment. Current symptoms include: fatigue She feels she is Unchanged since last visit.  Depression screen [facility]2/9 01/31/2021 12/14/2020 12/02/2020  Decreased Interest _1 Down, Depressed, Hopeless _2 PHQ - 2 Score _3 Altered sleeping 3 0 3  Tired, decreased energy _4 Change in appetite _5 Feeling bad or failure about yourself  0 0 0  Trouble concentrating _6 Moving slowly or fidgety/restless 0 1 2  Suicidal thoughts 0 0 0  PHQ-9 Score _7 Difficult doing work/chores Very difficult Somewhat difficult -  Some recent data might be hidden      Current Outpatient Medications on File Prior to Visit  Medication Sig Dispense Refill   acyclovir (ZOVIRAX) 800 MG tablet TAKE ONE (1) TABLET FOUR (4) TIMES DAILYAS NEEDED FOR SHINGLES (Patient taking differently: Take 800 mg by mouth daily as needed (Shingles).) 120 tablet 0   atorvastatin (LIPITOR) 10 MG tablet Take 10 mg  by mouth daily.     B Complex-C-Folic Acid TABS Take 1 tablet by mouth daily.     Brexpiprazole (REXULTI) 0.5 MG TABS Take 1 tablet (0.5 mg total) by mouth daily. 30 tablet 0   Calcium Carb-Cholecalciferol (LIQUID CALCIUM WITH D3) 8195978174 MG-UNIT CAPS Take 600 mg by mouth daily.     Calcium-Vitamin D-Vitamin K (VIACTIV CALCIUM PLUS D) 650-12.5-40 MG-MCG-MCG CHEW Chew 1 capsule by mouth daily.     Coenzyme Q10 100 MG TABS Take 1 tablet by mouth daily.     Continuous Blood Gluc Receiver (FREESTYLE LIBRE 2 READER) DEVI E11.21 Check blood sugar 4 times daily as directed 1 each  0   Continuous Blood Gluc Sensor (FREESTYLE LIBRE 2 SENSOR) MISC E11.21 Change sensor every 14 days as directed 6 each 3   cyanocobalamin (,VITAMIN B-12,) 1000 MCG/ML injection Inject 1 mL (1,000 mcg total) into the muscle every 30 (thirty) days. 1 mL 2   dapagliflozin propanediol (FARXIGA) 10 MG TABS tablet Take 10 mg by mouth daily.     Dulaglutide (TRULICITY) 8.46 KZ/9.9JT SOPN Inject 0.75 mg into the skin once a week. 6 mL 0   EPINEPHrine 0.3 mg/0.3 mL IJ SOAJ injection Inject 0.3 mg into the muscle as needed. 1 each 1   flecainide (TAMBOCOR) 100 MG tablet Take 1 tablet (100 mg total) by mouth 2 (two) times daily. 60 tablet 3   levothyroxine (SYNTHROID) 75 MCG tablet TAKE ONE (1) TABLET ONCE DAILY BEFORE BREAKFAST 30 tablet 0   losartan (COZAAR) 50 MG tablet Take 1 tablet (50 mg total) by mouth 2 (two) times daily. (Patient taking differently: Take 50 mg by mouth daily.) 1 tablet 0   Lutein 20 MG TABS Take 1 tablet by mouth daily.     Melatonin 10 MG TABS Take 10 mg by mouth 4 (four) times a week.     metFORMIN (GLUCOPHAGE) 1000 MG tablet Take 1,000 mg by mouth daily.     metoprolol succinate (TOPROL-XL) 25 MG 24 hr tablet TAKE 1 TABLET BY MOUTH IN THE MORNING AND 1 TABLET AT BEDTIME. (Patient taking differently: Take 25 mg by mouth 2 (two) times daily.) 60 tablet 11   Multiple Vitamin (MULTIVITAMIN WITH MINERALS) TABS tablet Take 1 tablet by mouth daily.     omega-3 acid ethyl esters (LOVAZA) 1 g capsule Take 2 capsules by mouth 2 (two) times daily.     omeprazole (PRILOSEC) 20 MG capsule TAKE ONE (1) CAPSULE EACH DAY 90 capsule 0   Probiotic Product (PROBIOTIC & ACIDOPHILUS EX ST PO) Take 1 tablet by mouth daily.      rivaroxaban (XARELTO) 20 MG TABS tablet Take 1 tablet (20 mg total) by mouth daily with supper. 30 tablet 3   torsemide (DEMADEX) 20 MG tablet Take 1 tablet (20 mg total) by mouth 3 (three) times a week. Please take this on Monday, Wednesday, and Friday. (Patient taking  differently: Take 20 mg by mouth every Monday, Wednesday, and Friday.) 90 tablet 0   Vitamin D, Ergocalciferol, (DRISDOL) 1.25 MG (50000 UNIT) CAPS capsule TAKE 1 CAPSULE BY MOUTH EVERY MONDAY (Patient taking differently: Take 50,000 Units by mouth every 7 (seven) days.) 12 capsule 1   vortioxetine HBr (TRINTELLIX) 20 MG TABS tablet Take 20 mg by mouth daily.     Zinc Sulfate (ZINC 15 PO) Take 150 mg by mouth 3 (three) times a week.     zolpidem (AMBIEN) 10 MG tablet TAKE 1 TABLET BY MOUTH AT BEDTIME AS NEEDED  FOR SLEEP (Patient taking differently: Take 10 mg by mouth at bedtime as needed for sleep.) 30 tablet 3   No current facility-administered medications on file prior to visit.   Past Medical History:  Diagnosis Date   Anxiety    Arthritis    Ataxic gait    Atrial fibrillation (HCC)    Atrophy of thyroid    B12 deficiency 10/31/2017   Cellulitis of abdominal wall 07/07/2016   Chronic anticoagulation 05/31/2018   Depression    Depression, major, recurrent, mild ([place]) 09/16/2019   Diabetes mellitus without complication ([place])    Diabetic glomerulopathy ([place]) 09/16/2019   Esophageal stricture 06/05/2017   Essential hypertension 02/24/2013   Fatigue 01/20/2020   Fibromyalgia 02/24/2013   Full dentures    Hereditary and idiopathic peripheral neuropathy 02/24/2013   High risk medication use 11/07/2017   Hyperlipidemia 02/24/2013   Hypertension    Hypertensive heart disease 02/24/2013   Incontinence    Insomnia    Longstanding persistent atrial fibrillation ([place]) 09/16/2019   Mild vitamin D deficiency 09/16/2019   Mixed hyperlipidemia 02/24/2013   Murmur, cardiac 08/15/2015   Neuropathy in diabetes ([place]) 10/31/2017   Osteoporosis    Other amnesia    Other amnesia    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    Paroxysmal atrial fibrillation ([place]) 08/15/2015   CHADS2vasc=3 CHADS2vasc=3   Primary insomnia    Restless legs syndrome 03/14/2013   Secondary hypothyroidism  09/16/2019   Thoracic or lumbosacral neuritis or radiculitis 02/24/2013   Type 2 diabetes mellitus, without long-term current use of insulin ([place]) 02/24/2013   Urge incontinence of urine 09/16/2019   Wears glasses    Past Surgical History:  Procedure Laterality Date   A-FLUTTER ABLATION N/A 07/30/2019   Procedure: A-FLUTTER ABLATION;  Surgeon: Constance Haw, MD;  Location: [facility];  Service: Cardiovascular;  Laterality: N/A;   ATRIAL FIBRILLATION ABLATION N/A 03/21/2019   Procedure: ATRIAL FIBRILLATION ABLATION;  Surgeon: Constance Haw, MD;  Location: [place] [facility];  Service: Cardiovascular;  Laterality: N/A;   [place] STUDY  11/26/2020   Procedure: BUBBLE STUDY;  Surgeon: Pixie Casino, MD;  Location: [facility] ENDOSCOPY;  Service: Cardiovascular;;   CARDIOVERSION N/A 11/26/2020   Procedure: CARDIOVERSION;  Surgeon: Pixie Casino, MD;  Location: [facility] ENDOSCOPY;  Service: Cardiovascular;  Laterality: N/A;   CATARACT EXTRACTION, BILATERAL  2018, 2019   CHOLECYSTECTOMY  1971   open   DIAGNOSTIC LAPAROSCOPY  2010   lysis of adhesions   DILATION AND CURETTAGE OF UTERUS  2004   KNEE ARTHROSCOPY  1998   left   KNEE ARTHROSCOPY W/ LATERAL RETINACULAR REPAIR     MASS EXCISION Left 11/11/2013   Procedure: EXCISION MUCOID CYST LEFT INDEX FINGER/DEBRIDEMENT LEFT INDEX FINGER;  Surgeon: Wynonia Sours, MD;  Location: [place];  Service: Orthopedics;  Laterality: Left;  ANESTHESIA: IV REGIONAL/FAB   SHOULDER ARTHROSCOPY W/ ROTATOR CUFF REPAIR  2007   left   TEE WITHOUT CARDIOVERSION N/A 11/26/2020   Procedure: TRANSESOPHAGEAL ECHOCARDIOGRAM (TEE);  Surgeon: Pixie Casino, MD;  Location[facility]COPY;  Service: Cardiovascular;  Laterality: N/A;   TRIGGER FINGER RELEASE Left 11/11/2013   Procedure: RELEASE A-1 PULLEY LEFT RING FINGER;  Surgeon: Wynonia Sours, MD;  Location: [place];  Service: Orthopedics;  Laterality:  Left;   UMBILICAL HERNIA REPAIR  2008, 2010    Family History  Problem Relation Age  of Onset   Heart failure Father    Heart disease Brother    Heart attack Brother    Heart failure Paternal Grandfather    Stroke Paternal Grandfather    Atrial fibrillation Mother    Heart attack Mother    Diabetes Maternal Grandmother    Social History   Socioeconomic History   Marital status: Married    Spouse name: Not on file   Number of children: Not on file   Years of education: Not on file   Highest education level: Not on file  Occupational History   Occupation: retired  Tobacco Use   Smoking status: Never   Smokeless tobacco: Never  Vaping Use   Vaping Use: Never used  Substance and Sexual Activity   Alcohol use: Yes    Alcohol/week: 3.0 - 4.0 standard drinks    Types: 1 - 2 Glasses of wine, 2 Shots of liquor per week    Comment: 4 times a week   Drug use: No   Sexual activity: Not on file  Other Topics Concern   Not on file  Social History Narrative   Not on file   Social Determinants of Health   Financial Resource Strain: Not on file  Food Insecurity: No Food Insecurity   Worried About Running Out of Food in the Last Year: Never true   Ran Out of Food in the Last Year: Never true  Transportation Needs: No Transportation Needs   Lack of Transportation (Medical): No   Lack of Transportation (Non-Medical): No  Physical Activity: Not on file  Stress: Not on file  Social Connections: Not on file    Review of Systems  Constitutional:  Negative for appetite change, fatigue and fever.  HENT:  Negative for congestion, ear pain, sinus pressure and sore throat.   Eyes:  Negative for pain.  Respiratory:  Negative for cough, chest tightness, shortness of breath and wheezing.   Cardiovascular:  Negative for chest pain and palpitations.  Gastrointestinal:  Negative for abdominal pain, constipation, diarrhea, nausea and vomiting.  Genitourinary:  Negative for dysuria and  hematuria.  Musculoskeletal:  Negative for arthralgias, back pain, joint swelling and myalgias.  Skin:  Negative for rash.  Neurological:  Negative for dizziness, weakness and headaches.  Psychiatric/Behavioral:  Positive for sleep disturbance. Negative for dysphoric mood and suicidal ideas. The patient is nervous/anxious.     Objective:  BP (!) 152/72 (BP Location: Left Arm, Patient Position: Sitting)   Pulse 62   Temp 97.6 F (36.4 C) (Temporal)   Ht _0  (1.727 m)   Wt 188 lb (85.3 kg)   SpO2 97%   BMI 28.59 kg/m   BP/Weight 01/31/2021 01/03/2021 6/59/9357  Systolic BP 017 793 903  Diastolic BP 72 70 80  Wt. (Lbs) 188 183 191.6  BMI 28.59 27.83 29.13    Physical Exam Vitals reviewed.  Constitutional:      Appearance: Normal appearance.  HENT:     Mouth/Throat:     Mouth: Mucous membranes are moist.  Cardiovascular:     Rate and Rhythm: Normal rate and regular rhythm.     Pulses: Normal pulses.     Heart sounds: Normal heart sounds.  Pulmonary:     Effort: Pulmonary effort is normal.     Breath sounds: Normal breath sounds.  Abdominal:     General: Bowel sounds are normal.     Palpations: Abdomen is soft.  Musculoskeletal:     Cervical back: Neck supple.  Skin:  General: Skin is warm and dry.     Capillary Refill: Capillary refill takes less than 2 seconds.  Neurological:     General: No focal deficit present.     Mental Status: She is alert and oriented to person, place, and time.  Psychiatric:        Mood and Affect: Mood normal.        Behavior: Behavior normal.     Lab Results  Component Value Date   WBC 6.9 11/02/2020   HGB 12.9 11/26/2020   HCT 38.0 11/26/2020   PLT 280 11/02/2020   GLUCOSE 130 (H) 11/26/2020   CHOL 156 09/03/2020   TRIG 153 (H) 09/03/2020   HDL 57 09/03/2020   LDLCALC 73 09/03/2020   ALT 42 (H) 09/14/2020   AST 24 09/14/2020   NA 141 11/26/2020   K 4.2 11/26/2020   CL 107 11/26/2020   CREATININE 0.90 11/26/2020   BUN  21 11/26/2020   CO2 23 11/15/2020   TSH 5.250 (H) 05/31/2020   HGBA1C 7.1 (H) 09/03/2020   MICROALBUR 150 05/27/2020      Assessment & Plan:     1. Depression, major, recurrent, mild (HCC) - TSH - brexpiprazole (REXULTI) 1 MG TABS tablet; Take 1 tablet (1 mg total) by mouth daily.  Dispense: 30 tablet; Refill: 0 - brexpiprazole (REXULTI) 1 MG TABS tablet; Take 1 tablet (1 mg total) by mouth daily.  Dispense: 30 tablet; Refill: 0 -samples given of Rexulti starter pack 0.5 mg and 1 mg; Pt given instructions to increase dose to 1 mg daily, verbalized understanding of instructions  2. Generalized anxiety disorder - TSH - brexpiprazole (REXULTI) 1 MG TABS tablet; Take 1 tablet (1 mg total) by mouth daily.  Dispense: 30 tablet; Refill: 0 - brexpiprazole (REXULTI) 1 MG TABS tablet; Take 1 tablet (1 mg total) by mouth daily.  Dispense: 30 tablet; Refill: 0  3. Secondary hypothyroidism - TSH  -Continue Levothyroxine, pending labs   Increase Rexulti to 1 mg daily Notify office of any adverse side effects Contact [facility] [phone] Follow-up in 4-weeks    Follow-up: 4-weeks  An After Visit Summary was printed and given to the patient.   I,Lauren M Auman,acting as a Education administrator for [employer], NP.,have documented all relevant documentation on the behalf of Rip Harbour, NP,as directed by  Rip Harbour, NP while in the presence of Rip Harbour, NP.   I, Rip Harbour, NP, have reviewed all documentation for this visit. The documentation on 01/31/21 for the exam, diagnosis, procedures, and orders are all accurate and complete.   Rip Harbour, NP [place] [phone]

## 2021-01-31 NOTE — Patient Instructions (Addendum)
Increase Rexulti to 1 mg daily Notify office of any adverse side effects Contact [facility] [phone] Follow-up in 4-weeks  Managing Anxiety, Adult After being diagnosed with an anxiety disorder, you may be relieved to know why you have felt or behaved a certain way. You may also feel overwhelmed about the treatment ahead and what it will mean for your life. With care and support, youcan manage this condition and recover from it. How to manage lifestyle changes Managing stress and anxiety  Stress is your body's reaction to life changes and events, both good and bad. Most stress will last just a few hours, but stress can be ongoing and can lead to more than just stress. Although stress can play a major role in anxiety, it is not the same as anxiety. Stress is usually caused by something external, such as a deadline, test, or competition. Stress normally passes after thetriggering event has ended.  Anxiety is caused by something internal, such as imagining a terrible outcome or worrying that something will go wrong that will devastate you. Anxiety often does not go away even after the triggering event is over, and it can become long-term (chronic) worry. It is important to understand the differences between stress and anxiety and to manage your stress effectively so that it does not lead to ananxious response. Talk with your health care provider or a counselor to learn more about reducing anxiety and stress. He or she may suggest tension reduction techniques, such as: Music therapy. This can include creating or listening to music that you enjoy and that inspires you. Mindfulness-based meditation. This involves being aware of your normal breaths while not trying to control your breathing. It can be done while sitting or walking. Centering prayer. This involves focusing on a word, phrase, or sacred image that means something to you and brings you peace. Deep breathing. To do this, expand your  stomach and inhale slowly through your nose. Hold your breath for 3-5 seconds. Then exhale slowly, letting your stomach muscles relax. Self-talk. This involves identifying thought patterns that lead to anxiety reactions and changing those patterns. Muscle relaxation. This involves tensing muscles and then relaxing them. Choose a tension reduction technique that suits your lifestyle and personality. These techniques take time and practice. Set aside 5-15 minutes a day to do them. Therapists can offer counseling and training in these techniques. The training to help with anxiety may be covered by some insurance plans. Other things you can do to manage stress and anxiety include: Keeping a stress/anxiety diary. This can help you learn what triggers your reaction and then learn ways to manage your response. Thinking about how you react to certain situations. You may not be able to control everything, but you can control your response. Making time for activities that help you relax and not feeling guilty about spending your time in this way. Visual imagery and yoga can help you stay calm and relax.  Medicines Medicines can help ease symptoms. Medicines for anxiety include: Anti-anxiety drugs. Antidepressants. Medicines are often used as a primary treatment for anxiety disorder. Medicines will be prescribed by a health care provider. When used together, medicines, psychotherapy, and tension reduction techniques may be the most effectivetreatment. Relationships Relationships can play a big part in helping you recover. Try to spend more time connecting with trusted friends and family members. Consider going to couples counseling, taking family education classes, or going to familytherapy. Therapy can help you and others better understand your condition. How to  recognize changes in your anxiety Everyone responds differently to treatment for anxiety. Recovery from anxiety happens when symptoms decrease and  stop interfering with your daily activities at home or work. This may mean that you will start to: Have better concentration and focus. Worry will interfere less in your daily thinking. Sleep better. Be less irritable. Have more energy. Have improved memory. It is important to recognize when your condition is getting worse. Contact your health care provider if your symptoms interfere with home or work and you feellike your condition is not improving. Follow these instructions at home: Activity Exercise. Most adults should do the following: Exercise for at least 150 minutes each week. The exercise should increase your heart rate and make you sweat (moderate-intensity exercise). Strengthening exercises at least twice a week. Get the right amount and quality of sleep. Most adults need 7-9 hours of sleep each night. Lifestyle  Eat a healthy diet that includes plenty of vegetables, fruits, whole grains, low-fat dairy products, and lean protein. Do not eat a lot of foods that are high in solid fats, added sugars, or salt. Make choices that simplify your life. Do not use any products that contain nicotine or tobacco, such as cigarettes, e-cigarettes, and chewing tobacco. If you need help quitting, ask your health care provider. Avoid caffeine, alcohol, and certain over-the-counter cold medicines. These may make you feel worse. Ask your pharmacist which medicines to avoid.  General instructions Take over-the-counter and prescription medicines only as told by your health care provider. Keep all follow-up visits as told by your health care provider. This is important. Where to find support You can get help and support from these sources: Self-help groups. Online and [employer]. A trusted spiritual leader. Couples counseling. Family education classes. Family therapy. Where to find more information You may find that joining a support group helps you deal with your anxiety. The  following sources can help you locate counselors or support groups near you: [place]: www.mentalhealthamerica.net Anxiety and Depression Association of [place] (ADAA): https://www.clark.net/ National Alliance on Mental Illness (NAMI): www.nami.org Contact a health care provider if you: Have a hard time staying focused or finishing daily tasks. Spend many hours a day feeling worried about everyday life. Become exhausted by worry. Start to have headaches, feel tense, or have nausea. Urinate more than normal. Have diarrhea. Get help right away if you have: A racing heart and shortness of breath. Thoughts of hurting yourself or others. If you ever feel like you may hurt yourself or others, or have thoughts about taking your own life, get help right away. You can go to your nearest emergency department or call: Your local emergency services (911 in the [place] A suicide crisis helpline, such as the [place] at 410-362-0622. This is open 24 hours a day. Summary Taking steps to learn and use tension reduction techniques can help calm you and help prevent triggering an anxiety reaction. When used together, medicines, psychotherapy, and tension reduction techniques may be the most effective treatment. Family, friends, and partners can play a big part in helping you recover from an anxiety disorder. This information is not intended to replace advice given to you by your health care provider. Make sure you discuss any questions you have with your healthcare provider. Document Revised: 12/03/2018 Document Reviewed: 12/03/2018 Elsevier Patient Education  2022 [place] Depression, Adult Depression is a mental health condition that affects your thoughts, feelings, and actions. Being diagnosed with depression can bring you  relief if you did not know why you have felt or behaved a certain way. It could also leave you feeling overwhelmed with uncertainty about  your future. Preparing yourself tomanage your symptoms can help you feel more positive about your future. How to manage lifestyle changes Managing stress  Stress is your body's reaction to life changes and events, both good and bad. Stress can add to your feelings of depression. Learning to manage your stresscan help lessen your feelings of depression. Try some of the following approaches to reducing your stress (stress reduction techniques): Listen to music that you enjoy and that inspires you. Try using a meditation app or take a meditation class. Develop a practice that helps you connect with your spiritual self. Walk in nature, pray, or go to a place of worship. Do some deep breathing. To do this, inhale slowly through your nose. Pause at the top of your inhale for a few seconds and then exhale slowly, letting your muscles relax. Practice yoga to help relax and work your muscles. Choose a stress reduction technique that suits your lifestyle and personality. These techniques take time and practice to develop. Set aside 5-15 minutes a day to do them. Therapists can offer training in these techniques. Other things you can do to manage stress include: Keeping a stress diary. Knowing your limits and saying no when you think something is too much. Paying attention to how you react to certain situations. You may not be able to control everything, but you can change your reaction. Adding humor to your life by watching funny films or TV shows. Making time for activities that you enjoy and that relax you.  Medicines Medicines, such as antidepressants, are often a part of treatment for depression. Talk with your pharmacist or health care provider about all the medicines, supplements, and herbal products that you take, their possible side effects, and what medicines and other products are safe to take together. Make sure to report any side effects you may have to your health care  provider. Relationships Your health care provider may suggest family therapy, couples therapy, orindividual therapy as part of your treatment. How to recognize changes Everyone responds differently to treatment for depression. As you recover from depression, you may start to: Have more interest in doing activities. Feel less hopeless. Have more energy. Overeat less often, or have a better appetite. Have better mental focus. It is important to recognize if your depression is not getting better or is getting worse. The symptoms you had in the beginning may return, such as: Tiredness (fatigue) or low energy. Eating too much or too little. Sleeping too much or too little. Feeling restless, agitated, or hopeless. Trouble focusing or making decisions. Unexplained physical complaints. Feeling irritable, angry, or aggressive. If you or your family members notice these symptoms coming back, let yourhealth care provider know right away. Follow these instructions at home: Activity  Try to get some form of exercise each day, such as walking, biking, swimming, or lifting weights. Practice stress reduction techniques. Engage your mind by taking a class or doing some volunteer work.  Lifestyle Get the right amount and quality of sleep. Cut down on using caffeine, tobacco, alcohol, and other potentially harmful substances. Eat a healthy diet that includes plenty of vegetables, fruits, whole grains, low-fat dairy products, and lean protein. Do not eat a lot of foods that are high in solid fats, added sugars, or salt (sodium). General instructions Take over-the-counter and prescription medicines only as told by  your health care provider. Keep all follow-up visits as told by your health care provider. This is important. Where to find support Talking to others  Friends and family members can be sources of support and guidance. Talk to trusted friends or family members about your condition. Explain  your symptoms to them, and let them know that you are working with a health care provider to treat your depression. Tell friends and family members how they also can behelpful. Finances Find appropriate mental health providers that fit with your financial situation. Talk with your health care provider about options to get reduced prices on your medicines. Where to find more information You can find support in your area from: Anxiety and Depression Association of [place] (ADAA): www.adaa.org Mental Health America: www.mentalhealthamerica.net [employer] on Mental Illness: www.nami.org Contact a health care provider if: You stop taking your antidepressant medicines, and you have any of these symptoms: Nausea. Headache. Light-headedness. Chills and body aches. Not being able to sleep (insomnia). You or your friends and family think your depression is getting worse. Get help right away if: You have thoughts of hurting yourself or others. If you ever feel like you may hurt yourself or others, or have thoughts about taking your own life, get help right away. Go to your nearest emergency department or: Call your local emergency services (911 in the [place] Call a suicide crisis helpline, such as the [place] at 731-592-2542. This is open 24 hours a day in the [place] Text the Crisis Text Line at (925)294-9111 (in the [place].). Summary If you are diagnosed with depression, preparing yourself to manage your symptoms is a good way to feel positive about your future. Work with your health care provider on a management plan that includes stress reduction techniques, medicines (if applicable), therapy, and healthy lifestyle habits. Keep talking with your health care provider about how your treatment is working. If you have thoughts about taking your own life, call a suicide crisis helpline or text a crisis text line. This information is not intended to replace advice given to you  by your health care provider. Make sure you discuss any questions you have with your healthcare provider. Document Revised: 05/14/2019 Document Reviewed: 05/14/2019 Elsevier Patient Education  2022 [employer].

## 2021-02-01 LAB — TSH: TSH: 3.9 u[IU]/mL (ref 0.450–4.500)

## 2021-02-02 ENCOUNTER — Ambulatory Visit: Payer: Medicare Other | Admitting: Cardiology

## 2021-02-03 DIAGNOSIS — L209 Atopic dermatitis, unspecified: Secondary | ICD-10-CM | POA: Diagnosis not present

## 2021-02-03 DIAGNOSIS — M1712 Unilateral primary osteoarthritis, left knee: Secondary | ICD-10-CM | POA: Diagnosis not present

## 2021-02-09 ENCOUNTER — Other Ambulatory Visit: Payer: Self-pay | Admitting: Physician Assistant

## 2021-02-11 ENCOUNTER — Ambulatory Visit: Payer: Medicare Other | Admitting: Pulmonary Disease

## 2021-02-13 NOTE — Progress Notes (Signed)
Established Patient Office Visit  Subjective:  Patient ID: April Munoz, female    DOB: [birth year]  Age: 70 y.o. MRN: [id number]  CC:  Chief Complaint  Patient presents with   Anxiety    Follow up    HPI [name] presents for follow-up of anxiety and depression. Current treatment includes Trintellix 20 mg daily and Rexulti 1 mg. She states medications are not working and she has recurrence of panic attacks. She has experienced increased stress in her life as her husband is being treated for cancer. Recommended counseling and psychiatric treatment but she has not attended appointments. She tells has tried multiple medications to treat anxiety and depression over the past several years. EMR reveals past anxiety and depression medications include Cymbalta, Effexor, Xanax, and current medications.     Anxiety, Follow-up  She was last seen for anxiety 2 weeks ago. Changes made at last visit include Rexulti 1 mg and Trintellix 20 mg daily.   She reports good compliance with treatment. She reports good tolerance of treatment. She is not having side effects.   She feels her anxiety is severe and Worse since last visit.  Symptoms: No chest pain Yes difficulty concentrating  No dizziness Yes fatigue  No feelings of losing control Yes insomnia  No irritable No palpitations  Yes panic attacks Yes racing thoughts  No shortness of breath No sweating  No tremors/shakes    GAD-7 Results GAD-7 Generalized Anxiety Disorder Screening Tool 02/14/2021 01/31/2021  1. Feeling Nervous, Anxious, or on Edge 3 3  2. Not Being Able to Stop or Control Worrying 3 3  3. Worrying Too Much About Different Things 2 3  4. Trouble Relaxing 3 3  5. Being So Restless it's Hard To Sit Still 3 3  6. Becoming Easily Annoyed or Irritable 0 0  7. Feeling Afraid As If Something Awful Might Happen 2 3  Total GAD-7 Score 16 18  Difficulty At Work, Home, or Getting  Along With Others? Not difficult at all Very  difficult    PHQ-9 Scores PHQ9 SCORE ONLY 02/14/2021 01/31/2021 12/14/2020  PHQ-9 Total Score '20 14 12     ' Past Medical History:  Diagnosis Date   Anxiety    Arthritis    Ataxic gait    Atrial fibrillation (HCC)    Atrophy of thyroid    B12 deficiency 10/31/2017   Cellulitis of abdominal wall 07/07/2016   Chronic anticoagulation 05/31/2018   Depression    Depression, major, recurrent, mild ([place]) 09/16/2019   Diabetes mellitus without complication ([place])    Diabetic glomerulopathy ([place]) 09/16/2019   Esophageal stricture 06/05/2017   Essential hypertension 02/24/2013   Fatigue 01/20/2020   Fibromyalgia 02/24/2013   Full dentures    Hereditary and idiopathic peripheral neuropathy 02/24/2013   High risk medication use 11/07/2017   Hyperlipidemia 02/24/2013   Hypertension    Hypertensive heart disease 02/24/2013   Incontinence    Insomnia    Longstanding persistent atrial fibrillation ([place]) 09/16/2019   Mild vitamin D deficiency 09/16/2019   Mixed hyperlipidemia 02/24/2013   Murmur, cardiac 08/15/2015   Neuropathy in diabetes ([place]) 10/31/2017   Osteoporosis    Other amnesia    Other amnesia    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    Paroxysmal atrial fibrillation ([place]) 08/15/2015   CHADS2vasc=3 CHADS2vasc=3   Primary insomnia    Restless legs syndrome 03/14/2013   Secondary hypothyroidism 09/16/2019   Thoracic or lumbosacral neuritis or radiculitis 02/24/2013  Type 2 diabetes mellitus, without long-term current use of insulin ([place]) 02/24/2013   Urge incontinence of urine 09/16/2019   Wears glasses     Past Surgical History:  Procedure Laterality Date   A-FLUTTER ABLATION N/A 07/30/2019   Procedure: A-FLUTTER ABLATION;  Surgeon: Constance Haw, MD;  Location: [facility];  Service: Cardiovascular;  Laterality: N/A;   ATRIAL FIBRILLATION ABLATION N/A 03/21/2019   Procedure: ATRIAL FIBRILLATION ABLATION;  Surgeon: Constance Haw, MD;  Location: [facility];  Service: Cardiovascular;  Laterality: N/A;   [place] STUDY  11/26/2020   Procedure: BUBBLE STUDY;  Surgeon: Pixie Casino, MD;  Location: [facility] ENDOSCOPY;  Service: Cardiovascular;;   CARDIOVERSION N/A 11/26/2020   Procedure: CARDIOVERSION;  Surgeon: Pixie Casino, MD;  Location: [facility] ENDOSCOPY;  Service: Cardiovascular;  Laterality: N/A;   CATARACT EXTRACTION, BILATERAL  2018, 2019   CHOLECYSTECTOMY  1971   open   DIAGNOSTIC LAPAROSCOPY  2010   lysis of adhesions   DILATION AND CURETTAGE OF UTERUS  2004   KNEE ARTHROSCOPY  1998   left   KNEE ARTHROSCOPY W/ LATERAL RETINACULAR REPAIR     MASS EXCISION Left 11/11/2013   Procedure: EXCISION MUCOID CYST LEFT INDEX FINGER/DEBRIDEMENT LEFT INDEX FINGER;  Surgeon: Wynonia Sours, MD;  Location: [place];  Service: Orthopedics;  Laterality: Left;  ANESTHESIA: IV REGIONAL/FAB   SHOULDER ARTHROSCOPY W/ ROTATOR CUFF REPAIR  2007   left   TEE WITHOUT CARDIOVERSION N/A 11/26/2020   Procedure: TRANSESOPHAGEAL ECHOCARDIOGRAM (TEE);  Surgeon: Pixie Casino, MD;  Location: [facility] ENDOSCOPY;  Service: Cardiovascular;  Laterality: N/A;   TRIGGER FINGER RELEASE Left 11/11/2013   Procedure: RELEASE A-1 PULLEY LEFT RING FINGER;  Surgeon: Wynonia Sours, MD;  Location: [place];  Service: Orthopedics;  Laterality: Left;   UMBILICAL HERNIA REPAIR  2008, 2010    Family History  Problem Relation Age of Onset   Heart failure Father    Heart disease Brother    Heart attack Brother    Heart failure Paternal Grandfather    Stroke Paternal Grandfather    Atrial fibrillation Mother    Heart attack Mother    Diabetes Maternal Grandmother     Social History   Socioeconomic History   Marital status: Married    Spouse name: Not on file   Number of children: Not on file   Years of education: Not on file   Highest education level: Not on file  Occupational History   Occupation:  retired  Tobacco Use   Smoking status: Never   Smokeless tobacco: Never  Vaping Use   Vaping Use: Never used  Substance and Sexual Activity   Alcohol use: Yes    Alcohol/week: 3.0 - 4.0 standard drinks    Types: 1 - 2 Glasses of wine, 2 Shots of liquor per week    Comment: 4 times a week   Drug use: No   Sexual activity: Not on file  Other Topics Concern   Not on file  Social History Narrative   Not on file   Social Determinants of Health   Financial Resource Strain: Not on file  Food Insecurity: No Food Insecurity   Worried About Running Out of Food in the Last Year: Never true   Ran Out of Food in the Last Year: Never true  Transportation Needs: No Transportation Needs   Lack of Transportation (Medical): No  Lack of Transportation (Non-Medical): No  Physical Activity: Not on file  Stress: Not on file  Social Connections: Not on file  Intimate Partner Violence: Not on file    Outpatient Medications Prior to Visit  Medication Sig Dispense Refill   acyclovir (ZOVIRAX) 800 MG tablet TAKE ONE (1) TABLET FOUR (4) TIMES DAILYAS NEEDED FOR SHINGLES (Patient taking differently: Take 800 mg by mouth daily as needed (Shingles).) 120 tablet 0   atorvastatin (LIPITOR) 10 MG tablet TAKE ONE (1) TABLET BY MOUTH ONCE DAILY 90 tablet 0   B Complex-C-Folic Acid TABS Take 1 tablet by mouth daily.     brexpiprazole (REXULTI) 1 MG TABS tablet Take 1 tablet (1 mg total) by mouth daily. 30 tablet 0   brexpiprazole (REXULTI) 1 MG TABS tablet Take 1 tablet (1 mg total) by mouth daily. 30 tablet 0   Calcium Carb-Cholecalciferol (LIQUID CALCIUM WITH D3) [phone] MG-UNIT CAPS Take 600 mg by mouth daily.     Calcium-Vitamin D-Vitamin K (VIACTIV CALCIUM PLUS D) 650-12.5-40 MG-MCG-MCG CHEW Chew 1 capsule by mouth daily.     Coenzyme Q10 100 MG TABS Take 1 tablet by mouth daily.     Continuous Blood Gluc Receiver (FREESTYLE LIBRE 2 READER) DEVI E11.21 Check blood sugar 4 times daily as directed 1 each  0   Continuous Blood Gluc Sensor (FREESTYLE LIBRE 2 SENSOR) MISC E11.21 Change sensor every 14 days as directed 6 each 3   cyanocobalamin (,VITAMIN B-12,) 1000 MCG/ML injection Inject 1 mL (1,000 mcg total) into the muscle every 30 (thirty) days. 1 mL 2   dapagliflozin propanediol (FARXIGA) 10 MG TABS tablet Take 10 mg by mouth daily.     Dulaglutide (TRULICITY) 5.88 FO/2.7XA SOPN Inject 0.75 mg into the skin once a week. 6 mL 0   EPINEPHrine 0.3 mg/0.3 mL IJ SOAJ injection Inject 0.3 mg into the muscle as needed. 1 each 1   flecainide (TAMBOCOR) 100 MG tablet Take 1 tablet (100 mg total) by mouth 2 (two) times daily. 60 tablet 3   levothyroxine (SYNTHROID) 75 MCG tablet TAKE ONE (1) TABLET ONCE DAILY BEFORE BREAKFAST 30 tablet 0   losartan (COZAAR) 50 MG tablet Take 1 tablet (50 mg total) by mouth 2 (two) times daily. (Patient taking differently: Take 50 mg by mouth daily.) 1 tablet 0   Lutein 20 MG TABS Take 1 tablet by mouth daily.     Melatonin 10 MG TABS Take 10 mg by mouth 4 (four) times a week.     metFORMIN (GLUCOPHAGE) 1000 MG tablet Take 1,000 mg by mouth daily.     metoprolol succinate (TOPROL-XL) 25 MG 24 hr tablet TAKE 1 TABLET BY MOUTH IN THE MORNING AND 1 TABLET AT BEDTIME. (Patient taking differently: Take 25 mg by mouth 2 (two) times daily.) 60 tablet 11   Multiple Vitamin (MULTIVITAMIN WITH MINERALS) TABS tablet Take 1 tablet by mouth daily.     omega-3 acid ethyl esters (LOVAZA) 1 g capsule Take 2 capsules by mouth 2 (two) times daily.     omeprazole (PRILOSEC) 20 MG capsule TAKE ONE (1) CAPSULE EACH DAY 90 capsule 0   Probiotic Product (PROBIOTIC & ACIDOPHILUS EX ST PO) Take 1 tablet by mouth daily.      rivaroxaban (XARELTO) 20 MG TABS tablet Take 1 tablet (20 mg total) by mouth daily with supper. 30 tablet 3   torsemide (DEMADEX) 20 MG tablet Take 1 tablet (20 mg total) by mouth 3 (three) times a week. Please take  this on Monday, Wednesday, and Friday. (Patient taking  differently: Take 20 mg by mouth every Monday, Wednesday, and Friday.) 90 tablet 0   Vitamin D, Ergocalciferol, (DRISDOL) 1.25 MG (50000 UNIT) CAPS capsule TAKE 1 CAPSULE BY MOUTH EVERY MONDAY (Patient taking differently: Take 50,000 Units by mouth every 7 (seven) days.) 12 capsule 1   vortioxetine HBr (TRINTELLIX) 20 MG TABS tablet Take 20 mg by mouth daily.     Zinc Sulfate (ZINC 15 PO) Take 150 mg by mouth 3 (three) times a week.     zolpidem (AMBIEN) 10 MG tablet TAKE 1 TABLET BY MOUTH AT BEDTIME AS NEEDED FOR SLEEP (Patient taking differently: Take 10 mg by mouth at bedtime as needed for sleep.) 30 tablet 3   No facility-administered medications prior to visit.    Allergies  Allergen Reactions   Prednisone     In high doses causes her to feel crazy    ROS Review of Systems  Constitutional:  Negative for fatigue and fever.  HENT:  Positive for facial swelling. Negative for congestion, ear pain, sinus pressure and sore throat.   Eyes:  Negative for pain.  Respiratory:  Negative for cough, chest tightness, shortness of breath and wheezing.   Cardiovascular:  Negative for chest pain and palpitations.  Gastrointestinal:  Negative for abdominal pain, constipation, diarrhea, nausea and vomiting.  Endocrine: Negative.   Genitourinary:  Negative for dysuria and hematuria.  Musculoskeletal:  Negative for arthralgias, back pain, joint swelling and myalgias.  Skin:  Positive for rash.       Healing bruises to right side of face  Allergic/Immunologic: Positive for environmental allergies.  Neurological:  Negative for dizziness, weakness and headaches.  Hematological: Negative.   Psychiatric/Behavioral:  Positive for sleep disturbance. Negative for dysphoric mood. The patient is nervous/anxious.      Objective:    Physical Exam Vitals reviewed.  Constitutional:      Appearance: Normal appearance. She is well-developed.  HENT:     Head: Normocephalic.  Skin:    General: Skin is warm  and dry.     Capillary Refill: Capillary refill takes less than 2 seconds.     Findings: Bruising (right side of face) present.  Neurological:     General: No focal deficit present.     Mental Status: She is alert and oriented to person, place, and time.  Psychiatric:        Mood and Affect: Mood normal.        Behavior: Behavior normal.    BP 140/76 (BP Location: Left Arm, Patient Position: Sitting)   Pulse 72   Temp (!) 97.5 F (36.4 C) (Temporal)   Ht '5\' 8"'  (1.727 m)   Wt 181 lb (82.1 kg)   SpO2 97%   BMI 27.52 kg/m  Wt Readings from Last 3 Encounters:  02/14/21 181 lb (82.1 kg)  01/31/21 188 lb (85.3 kg)  01/03/21 183 lb (83 kg)     Health Maintenance Due  Topic Date Due   Hepatitis C Screening  Never done   TETANUS/TDAP  Never done   Zoster Vaccines- Shingrix (1 of 2) Never done   DEXA SCAN  07/18/2020   COVID-19 Vaccine (4 - Booster for Moderna series) 07/20/2020   MAMMOGRAM  11/24/2020   COLONOSCOPY (Pts 45-85yr Insurance coverage will need to be confirmed)  12/01/2020   INFLUENZA VACCINE  02/14/2021    There are no preventive care reminders to display for this patient.  Lab Results  Component Value  Date   TSH 3.900 01/31/2021   Lab Results  Component Value Date   WBC 6.9 11/02/2020   HGB 12.9 11/26/2020   HCT 38.0 11/26/2020   MCV 89 11/02/2020   PLT 280 11/02/2020   Lab Results  Component Value Date   NA 141 11/26/2020   K 4.2 11/26/2020   CO2 23 11/15/2020   GLUCOSE 130 (H) 11/26/2020   BUN 21 11/26/2020   CREATININE 0.90 11/26/2020   BILITOT 0.4 09/14/2020   ALKPHOS 115 09/14/2020   AST 24 09/14/2020   ALT 42 (H) 09/14/2020   PROT 6.0 09/14/2020   ALBUMIN 3.8 09/14/2020   CALCIUM 10.1 11/15/2020   ANIONGAP 11 03/21/2019   EGFR 55 (L) 11/15/2020   Lab Results  Component Value Date   CHOL 156 09/03/2020   Lab Results  Component Value Date   HDL 57 09/03/2020   Lab Results  Component Value Date   LDLCALC 73 09/03/2020   Lab  Results  Component Value Date   TRIG 153 (H) 09/03/2020   Lab Results  Component Value Date   CHOLHDL 2.7 09/03/2020   Lab Results  Component Value Date   HGBA1C 7.1 (H) 09/03/2020      Assessment & Plan:   1. Depression, major, single episode, severe (HCC) - venlafaxine XR (EFFEXOR XR) 75 MG 24 hr capsule; Take 1 capsule (75 mg total) by mouth daily with breakfast for 7 days, THEN 2 capsules (150 mg total) daily with breakfast for 23 days.  Dispense: 53 capsule; Refill: 0 - Ambulatory referral to Psychiatry  2. Anxiety - venlafaxine XR (EFFEXOR XR) 75 MG 24 hr capsule; Take 1 capsule (75 mg total) by mouth daily with breakfast for 7 days, THEN 2 capsules (150 mg total) daily with breakfast for 23 days.  Dispense: 53 capsule; Refill: 0 - Ambulatory referral to Psychiatry    Decrease trintillex to 10 mg daily in am.  Start effexor xr 75 mg once daily in am immediately and take for 7 days.  Stop trintillex.  Increase effexor xr to 75 mg 2 daily in am.   Leave rexulti at 1 mg daily.   Pt needs to see psychiatry for medication management as well as for counseling.    Follow-up:  4-weeks   I,Lauren M Auman,acting as a scribe for [employer], NP.,have documented all relevant documentation on the behalf of Rip Harbour, NP,as directed by  Rip Harbour, NP while in the presence of Rip Harbour, NP.   I, Rip Harbour, NP, have reviewed all documentation for this visit. The documentation on 02/14/21 for the exam, diagnosis, procedures, and orders are all accurate and complete.     Signed, Jerrell Belfast, DNP

## 2021-02-14 ENCOUNTER — Other Ambulatory Visit: Payer: Self-pay

## 2021-02-14 ENCOUNTER — Encounter: Payer: Self-pay | Admitting: Nurse Practitioner

## 2021-02-14 ENCOUNTER — Ambulatory Visit (INDEPENDENT_AMBULATORY_CARE_PROVIDER_SITE_OTHER): Payer: Medicare Other | Admitting: Nurse Practitioner

## 2021-02-14 VITALS — BP 140/76 | HR 72 | Temp 97.5°F | Ht 68.0 in | Wt 181.0 lb

## 2021-02-14 DIAGNOSIS — F322 Major depressive disorder, single episode, severe without psychotic features: Secondary | ICD-10-CM | POA: Diagnosis not present

## 2021-02-14 DIAGNOSIS — F419 Anxiety disorder, unspecified: Secondary | ICD-10-CM | POA: Diagnosis not present

## 2021-02-14 MED ORDER — VENLAFAXINE HCL ER 75 MG PO CP24: ORAL_CAPSULE | ORAL | 0 refills | Status: DC

## 2021-02-14 NOTE — Patient Instructions (Addendum)
Decrease trintillex to 10 mg daily in am.  Start effexor xr 75 mg once daily in am immediately and take for 7 days.  Stop trintillex.  Increase effexor xr to 75 mg 2 daily in am.   Leave rexulti at 1 mg daily.   Pt needs to see psychiatry for medication management as well as for counseling.     Managing Depression, Adult Depression is a mental health condition that affects your thoughts, feelings, and actions. Being diagnosed with depression can bring you relief if you did not know why you have felt or behaved a certain way. It could also leave you feeling overwhelmed with uncertainty about your future. Preparing yourself tomanage your symptoms can help you feel more positive about your future. How to manage lifestyle changes Managing stress  Stress is your body's reaction to life changes and events, both good and bad. Stress can add to your feelings of depression. Learning to manage your stresscan help lessen your feelings of depression. Try some of the following approaches to reducing your stress (stress reduction techniques): Listen to music that you enjoy and that inspires you. Try using a meditation app or take a meditation class. Develop a practice that helps you connect with your spiritual self. Walk in nature, pray, or go to a place of worship. Do some deep breathing. To do this, inhale slowly through your nose. Pause at the top of your inhale for a few seconds and then exhale slowly, letting your muscles relax. Practice yoga to help relax and work your muscles. Choose a stress reduction technique that suits your lifestyle and personality. These techniques take time and practice to develop. Set aside 5-15 minutes a day to do them. Therapists can offer training in these techniques. Other things you can do to manage stress include: Keeping a stress diary. Knowing your limits and saying no when you think something is too much. Paying attention to how you react to certain situations.  You may not be able to control everything, but you can change your reaction. Adding humor to your life by watching funny films or TV shows. Making time for activities that you enjoy and that relax you.  Medicines Medicines, such as antidepressants, are often a part of treatment for depression. Talk with your pharmacist or health care provider about all the medicines, supplements, and herbal products that you take, their possible side effects, and what medicines and other products are safe to take together. Make sure to report any side effects you may have to your health care provider. Relationships Your health care provider may suggest family therapy, couples therapy, orindividual therapy as part of your treatment. How to recognize changes Everyone responds differently to treatment for depression. As you recover from depression, you may start to: Have more interest in doing activities. Feel less hopeless. Have more energy. Overeat less often, or have a better appetite. Have better mental focus. It is important to recognize if your depression is not getting better or is getting worse. The symptoms you had in the beginning may return, such as: Tiredness (fatigue) or low energy. Eating too much or too little. Sleeping too much or too little. Feeling restless, agitated, or hopeless. Trouble focusing or making decisions. Unexplained physical complaints. Feeling irritable, angry, or aggressive. If you or your family members notice these symptoms coming back, let yourhealth care provider know right away. Follow these instructions at home: Activity  Try to get some form of exercise each day, such as walking, biking, swimming, or  lifting weights. Practice stress reduction techniques. Engage your mind by taking a class or doing some volunteer work.  Lifestyle Get the right amount and quality of sleep. Cut down on using caffeine, tobacco, alcohol, and other potentially harmful  substances. Eat a healthy diet that includes plenty of vegetables, fruits, whole grains, low-fat dairy products, and lean protein. Do not eat a lot of foods that are high in solid fats, added sugars, or salt (sodium). General instructions Take over-the-counter and prescription medicines only as told by your health care provider. Keep all follow-up visits as told by your health care provider. This is important. Where to find support Talking to others  Friends and family members can be sources of support and guidance. Talk to trusted friends or family members about your condition. Explain your symptoms to them, and let them know that you are working with a health care provider to treat your depression. Tell friends and family members how they also can behelpful. Finances Find appropriate mental health providers that fit with your financial situation. Talk with your health care provider about options to get reduced prices on your medicines. Where to find more information You can find support in your area from: Anxiety and Depression Association of America (ADAA): www.adaa.org Mental Health America: www.mentalhealthamerica.net [employer] on Mental Illness: www.nami.org Contact a health care provider if: You stop taking your antidepressant medicines, and you have any of these symptoms: Nausea. Headache. Light-headedness. Chills and body aches. Not being able to sleep (insomnia). You or your friends and family think your depression is getting worse. Get help right away if: You have thoughts of hurting yourself or others. If you ever feel like you may hurt yourself or others, or have thoughts about taking your own life, get help right away. Go to your nearest emergency department or: Call your local emergency services (911 in the [place] Call a suicide crisis helpline, such as the [facility] at 812-589-2724. This is open 24 hours a day in the [place] Text the  Crisis Text Line at 671-725-7272 (in the [place].). Summary If you are diagnosed with depression, preparing yourself to manage your symptoms is a good way to feel positive about your future. Work with your health care provider on a management plan that includes stress reduction techniques, medicines (if applicable), therapy, and healthy lifestyle habits. Keep talking with your health care provider about how your treatment is working. If you have thoughts about taking your own life, call a suicide crisis helpline or text a crisis text line. This information is not intended to replace advice given to you by your health care provider. Make sure you discuss any questions you have with your healthcare provider. Document Revised: 05/14/2019 Document Reviewed: 05/14/2019 Elsevier Patient Education  2022 [place], Adult After being diagnosed with an anxiety disorder, you may be relieved to know why you have felt or behaved a certain way. You may also feel overwhelmed about the treatment ahead and what it will mean for your life. With care and support, youcan manage this condition and recover from it. How to manage lifestyle changes Managing stress and anxiety  Stress is your body's reaction to life changes and events, both good and bad. Most stress will last just a few hours, but stress can be ongoing and can lead to more than just stress. Although stress can play a major role in anxiety, it is not the same as anxiety. Stress is usually caused by something  external, such as a deadline, test, or competition. Stress normally passes after thetriggering event has ended.  Anxiety is caused by something internal, such as imagining a terrible outcome or worrying that something will go wrong that will devastate you. Anxiety often does not go away even after the triggering event is over, and it can become long-term (chronic) worry. It is important to understand the differences between stress and  anxiety and to manage your stress effectively so that it does not lead to ananxious response. Talk with your health care provider or a counselor to learn more about reducing anxiety and stress. He or she may suggest tension reduction techniques, such as: Music therapy. This can include creating or listening to music that you enjoy and that inspires you. Mindfulness-based meditation. This involves being aware of your normal breaths while not trying to control your breathing. It can be done while sitting or walking. Centering prayer. This involves focusing on a word, phrase, or sacred image that means something to you and brings you peace. Deep breathing. To do this, expand your stomach and inhale slowly through your nose. Hold your breath for 3-5 seconds. Then exhale slowly, letting your stomach muscles relax. Self-talk. This involves identifying thought patterns that lead to anxiety reactions and changing those patterns. Muscle relaxation. This involves tensing muscles and then relaxing them. Choose a tension reduction technique that suits your lifestyle and personality. These techniques take time and practice. Set aside 5-15 minutes a day to do them. Therapists can offer counseling and training in these techniques. The training to help with anxiety may be covered by some insurance plans. Other things you can do to manage stress and anxiety include: Keeping a stress/anxiety diary. This can help you learn what triggers your reaction and then learn ways to manage your response. Thinking about how you react to certain situations. You may not be able to control everything, but you can control your response. Making time for activities that help you relax and not feeling guilty about spending your time in this way. Visual imagery and yoga can help you stay calm and relax.  Medicines Medicines can help ease symptoms. Medicines for anxiety include: Anti-anxiety drugs. Antidepressants. Medicines are often  used as a primary treatment for anxiety disorder. Medicines will be prescribed by a health care provider. When used together, medicines, psychotherapy, and tension reduction techniques may be the most effectivetreatment. Relationships Relationships can play a big part in helping you recover. Try to spend more time connecting with trusted friends and family members. Consider going to couples counseling, taking family education classes, or going to familytherapy. Therapy can help you and others better understand your condition. How to recognize changes in your anxiety Everyone responds differently to treatment for anxiety. Recovery from anxiety happens when symptoms decrease and stop interfering with your daily activities at home or work. This may mean that you will start to: Have better concentration and focus. Worry will interfere less in your daily thinking. Sleep better. Be less irritable. Have more energy. Have improved memory. It is important to recognize when your condition is getting worse. Contact your health care provider if your symptoms interfere with home or work and you feellike your condition is not improving. Follow these instructions at home: Activity Exercise. Most adults should do the following: Exercise for at least 150 minutes each week. The exercise should increase your heart rate and make you sweat (moderate-intensity exercise). Strengthening exercises at least twice a week. Get the right amount and quality  of sleep. Most adults need 7-9 hours of sleep each night. Lifestyle  Eat a healthy diet that includes plenty of vegetables, fruits, whole grains, low-fat dairy products, and lean protein. Do not eat a lot of foods that are high in solid fats, added sugars, or salt. Make choices that simplify your life. Do not use any products that contain nicotine or tobacco, such as cigarettes, e-cigarettes, and chewing tobacco. If you need help quitting, ask your health care  provider. Avoid caffeine, alcohol, and certain over-the-counter cold medicines. These may make you feel worse. Ask your pharmacist which medicines to avoid.  General instructions Take over-the-counter and prescription medicines only as told by your health care provider. Keep all follow-up visits as told by your health care provider. This is important. Where to find support You can get help and support from these sources: Self-help groups. Online and [employer]. A trusted spiritual leader. Couples counseling. Family education classes. Family therapy. Where to find more information You may find that joining a support group helps you deal with your anxiety. The following sources can help you locate counselors or support groups near you: [place]: www.mentalhealthamerica.net Anxiety and Depression Association of [place] (ADAA): https://www.clark.net/ National Alliance on Mental Illness (NAMI): www.nami.org Contact a health care provider if you: Have a hard time staying focused or finishing daily tasks. Spend many hours a day feeling worried about everyday life. Become exhausted by worry. Start to have headaches, feel tense, or have nausea. Urinate more than normal. Have diarrhea. Get help right away if you have: A racing heart and shortness of breath. Thoughts of hurting yourself or others. If you ever feel like you may hurt yourself or others, or have thoughts about taking your own life, get help right away. You can go to your nearest emergency department or call: Your local emergency services (911 in the [place] A suicide crisis helpline, such as the [facility] at (216) 022-8954. This is open 24 hours a day. Summary Taking steps to learn and use tension reduction techniques can help calm you and help prevent triggering an anxiety reaction. When used together, medicines, psychotherapy, and tension reduction techniques may be the most effective  treatment. Family, friends, and partners can play a big part in helping you recover from an anxiety disorder. This information is not intended to replace advice given to you by your health care provider. Make sure you discuss any questions you have with your healthcare provider. Document Revised: 12/03/2018 Document Reviewed: 12/03/2018 Elsevier Patient Education  [place].

## 2021-02-25 ENCOUNTER — Other Ambulatory Visit: Payer: Self-pay | Admitting: Family Medicine

## 2021-02-25 DIAGNOSIS — F5101 Primary insomnia: Secondary | ICD-10-CM

## 2021-03-03 ENCOUNTER — Other Ambulatory Visit: Payer: Self-pay | Admitting: Family Medicine

## 2021-03-08 ENCOUNTER — Other Ambulatory Visit: Payer: Self-pay

## 2021-03-08 ENCOUNTER — Ambulatory Visit (INDEPENDENT_AMBULATORY_CARE_PROVIDER_SITE_OTHER): Payer: Medicare Other | Admitting: Family Medicine

## 2021-03-08 VITALS — BP 110/70 | HR 72 | Temp 97.4°F | Resp 16 | Ht 68.0 in | Wt 184.0 lb

## 2021-03-08 DIAGNOSIS — F411 Generalized anxiety disorder: Secondary | ICD-10-CM

## 2021-03-08 DIAGNOSIS — E782 Mixed hyperlipidemia: Secondary | ICD-10-CM | POA: Diagnosis not present

## 2021-03-08 DIAGNOSIS — F330 Major depressive disorder, recurrent, mild: Secondary | ICD-10-CM

## 2021-03-08 DIAGNOSIS — E038 Other specified hypothyroidism: Secondary | ICD-10-CM | POA: Diagnosis not present

## 2021-03-08 DIAGNOSIS — R82998 Other abnormal findings in urine: Secondary | ICD-10-CM | POA: Diagnosis not present

## 2021-03-08 DIAGNOSIS — N3000 Acute cystitis without hematuria: Secondary | ICD-10-CM

## 2021-03-08 DIAGNOSIS — E1121 Type 2 diabetes mellitus with diabetic nephropathy: Secondary | ICD-10-CM

## 2021-03-08 LAB — POCT URINALYSIS DIPSTICK
Bilirubin, UA: NEGATIVE
Blood, UA: NEGATIVE
Glucose, UA: POSITIVE — AB
Ketones, UA: NEGATIVE
Nitrite, UA: NEGATIVE
Protein, UA: POSITIVE — AB
Spec Grav, UA: 1.02 (ref 1.010–1.025)
Urobilinogen, UA: 1 E.U./dL
pH, UA: 5.5 (ref 5.0–8.0)

## 2021-03-08 MED ORDER — BREXPIPRAZOLE 1 MG PO TABS: 1.0000 mg | ORAL_TABLET | Freq: Every day | ORAL | 1 refills | Status: DC

## 2021-03-08 MED ORDER — VENLAFAXINE HCL ER 150 MG PO CP24: 150.0000 mg | ORAL_CAPSULE | Freq: Every day | ORAL | 1 refills | Status: DC

## 2021-03-08 MED ORDER — BREXPIPRAZOLE 1 MG PO TABS
1.0000 mg | ORAL_TABLET | Freq: Every day | ORAL | 1 refills | Status: DC
Start: 2021-03-08 — End: 2021-03-08

## 2021-03-08 NOTE — Progress Notes (Signed)
Subjective:  Patient ID: April Munoz, female    DOB: 1951/02/13  Age: 70 y.o. MRN: [id number]  Chief Complaint  Patient presents with   Urinary Frequency   Depression   HPI: Complaining of urinary frequency and pressure. No vaginal discharge. No dysuria.  Urinary Frequency  Associated symptoms include frequency. Pertinent negatives include no chills, nausea, urgency or vomiting.  Depression        Associated symptoms include no fatigue, no myalgias and no headaches.  Follow up on depression and anxiety. ON effexor xr 75 mg 2 daily in am and rexulti 1 mg in evening. Working very well.    PHQ9 SCORE ONLY 03/08/2021 02/14/2021 01/31/2021  PHQ-9 Total Score '7 20 14   '$ Pt noted to be overdue for a visit for diabetes and hyperlipidemia. Sugars 120-155. Checks feet daily. DM eye exam was 10/14/2020. On metformin, farxiga. ON lipitro. On losartan, torsemide,  Atrial fibrillation. On flecainide and xarelto. Hypothyroidism: on synthroid 75 mc once daily.   Current Outpatient Medications on File Prior to Visit  Medication Sig Dispense Refill   acyclovir (ZOVIRAX) 800 MG tablet TAKE ONE (1) TABLET FOUR (4) TIMES DAILYAS NEEDED FOR SHINGLES (Patient taking differently: Take 800 mg by mouth daily as needed (Shingles).) 120 tablet 0   atorvastatin (LIPITOR) 10 MG tablet TAKE ONE (1) TABLET BY MOUTH ONCE DAILY 90 tablet 0   B Complex-C-Folic Acid TABS Take 1 tablet by mouth daily.     Calcium Carb-Cholecalciferol (LIQUID CALCIUM WITH D3) [phone] MG-UNIT CAPS Take 600 mg by mouth daily.     Calcium-Vitamin D-Vitamin K (VIACTIV CALCIUM PLUS D) 650-12.5-40 MG-MCG-MCG CHEW Chew 1 capsule by mouth daily.     Coenzyme Q10 100 MG TABS Take 1 tablet by mouth daily.     Continuous Blood Gluc Receiver (FREESTYLE LIBRE 2 READER) DEVI E11.21 Check blood sugar 4 times daily as directed 1 each 0   Continuous Blood Gluc Sensor (FREESTYLE LIBRE 2 SENSOR) MISC E11.21 Change sensor every 14 days as directed 6 each 3    cyanocobalamin (,VITAMIN B-12,) 1000 MCG/ML injection Inject 1 mL (1,000 mcg total) into the muscle every 30 (thirty) days. 1 mL 2   dapagliflozin propanediol (FARXIGA) 10 MG TABS tablet Take 10 mg by mouth daily.     Dulaglutide (TRULICITY) A999333 0000000 SOPN Inject 0.75 mg into the skin once a week. 6 mL 0   EPINEPHrine 0.3 mg/0.3 mL IJ SOAJ injection Inject 0.3 mg into the muscle as needed. 1 each 1   flecainide (TAMBOCOR) 100 MG tablet Take 1 tablet (100 mg total) by mouth 2 (two) times daily. 60 tablet 3   levothyroxine (SYNTHROID) 75 MCG tablet TAKE ONE (1) TABLET ONCE DAILY BEFORE BREAKFAST 90 tablet 1   Lutein 20 MG TABS Take 1 tablet by mouth daily.     Melatonin 10 MG TABS Take 10 mg by mouth 4 (four) times a week.     metFORMIN (GLUCOPHAGE) 1000 MG tablet Take 1,000 mg by mouth daily.     metoprolol succinate (TOPROL-XL) 25 MG 24 hr tablet TAKE 1 TABLET BY MOUTH IN THE MORNING AND 1 TABLET AT BEDTIME. (Patient taking differently: Take 25 mg by mouth 2 (two) times daily.) 60 tablet 11   Multiple Vitamin (MULTIVITAMIN WITH MINERALS) TABS tablet Take 1 tablet by mouth daily.     omega-3 acid ethyl esters (LOVAZA) 1 g capsule Take 2 capsules by mouth 2 (two) times daily.     omeprazole (PRILOSEC) 20 MG capsule  TAKE ONE (1) CAPSULE EACH DAY 90 capsule 0   Probiotic Product (PROBIOTIC & ACIDOPHILUS EX ST PO) Take 1 tablet by mouth daily.      torsemide (DEMADEX) 20 MG tablet Take 1 tablet (20 mg total) by mouth 3 (three) times a week. Please take this on Monday, Wednesday, and Friday. (Patient taking differently: Take 20 mg by mouth every Monday, Wednesday, and Friday.) 90 tablet 0   Vitamin D, Ergocalciferol, (DRISDOL) 1.25 MG (50000 UNIT) CAPS capsule TAKE 1 CAPSULE BY MOUTH EVERY MONDAY (Patient taking differently: Take 50,000 Units by mouth every 7 (seven) days.) 12 capsule 1   Zinc Sulfate (ZINC 15 PO) Take 150 mg by mouth 3 (three) times a week.     zolpidem (AMBIEN) 10 MG tablet Take 1  tablet (10 mg total) by mouth at bedtime as needed for sleep. 30 tablet 2   No current facility-administered medications on file prior to visit.   Past Medical History:  Diagnosis Date   Anxiety    Arthritis    Ataxic gait    Atrial fibrillation (HCC)    Atrophy of thyroid    B12 deficiency 10/31/2017   Cellulitis of abdominal wall 07/07/2016   Chronic anticoagulation 05/31/2018   Depression    Depression, major, recurrent, mild ([place]) 09/16/2019   Diabetes mellitus without complication ([place])    Diabetic glomerulopathy ([place]) 09/16/2019   Esophageal stricture 06/05/2017   Essential hypertension 02/24/2013   Fatigue 01/20/2020   Fibromyalgia 02/24/2013   Full dentures    Hereditary and idiopathic peripheral neuropathy 02/24/2013   High risk medication use 11/07/2017   Hyperlipidemia 02/24/2013   Hypertension    Hypertensive heart disease 02/24/2013   Incontinence    Insomnia    Longstanding persistent atrial fibrillation ([place]) 09/16/2019   Mild vitamin D deficiency 09/16/2019   Mixed hyperlipidemia 02/24/2013   Murmur, cardiac 08/15/2015   Neuropathy in diabetes ([place]) 10/31/2017   Osteoporosis    Other amnesia    Other amnesia    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    Paroxysmal atrial fibrillation ([place]) 08/15/2015   CHADS2vasc=3 CHADS2vasc=3   Primary insomnia    Restless legs syndrome 03/14/2013   Secondary hypothyroidism 09/16/2019   Thoracic or lumbosacral neuritis or radiculitis 02/24/2013   Type 2 diabetes mellitus, without long-term current use of insulin ([place]) 02/24/2013   Urge incontinence of urine 09/16/2019   Wears glasses    Past Surgical History:  Procedure Laterality Date   A-FLUTTER ABLATION N/A 07/30/2019   Procedure: A-FLUTTER ABLATION;  Surgeon: Constance Haw, MD;  Location: [place] [facility];  Service: Cardiovascular;  Laterality: N/A;   ATRIAL FIBRILLATION ABLATION N/A 03/21/2019   Procedure: ATRIAL FIBRILLATION ABLATION;  Surgeon: Constance Haw, MD;  Location: [place] [facility];  Service: Cardiovascular;  Laterality: N/A;   [place] STUDY  11/26/2020   Procedure: BUBBLE STUDY;  Surgeon: Pixie Casino, MD;  Location: [facility] ENDOSCOPY;  Service: Cardiovascular;;   CARDIOVERSION N/A 11/26/2020   Procedure: CARDIOVERSION;  Surgeon: Pixie Casino, MD;  Location: [facility] ENDOSCOPY;  Service: Cardiovascular;  Laterality: N/A;   CATARACT EXTRACTION, BILATERAL  2018, 2019   CHOLECYSTECTOMY  1971   open   DIAGNOSTIC LAPAROSCOPY  2010   lysis of adhesions   DILATION AND CURETTAGE OF UTERUS  2004   KNEE ARTHROSCOPY  1998   left   KNEE ARTHROSCOPY W/ LATERAL RETINACULAR REPAIR     MASS  EXCISION Left 11/11/2013   Procedure: EXCISION MUCOID CYST LEFT INDEX FINGER/DEBRIDEMENT LEFT INDEX FINGER;  Surgeon: Wynonia Sours, MD;  Location: [place];  Service: Orthopedics;  Laterality: Left;  ANESTHESIA: IV REGIONAL/FAB   SHOULDER ARTHROSCOPY W/ ROTATOR CUFF REPAIR  2007   left   TEE WITHOUT CARDIOVERSION N/A 11/26/2020   Procedure: TRANSESOPHAGEAL ECHOCARDIOGRAM (TEE);  Surgeon: Pixie Casino, MD;  Location: [facility] ENDOSCOPY;  Service: Cardiovascular;  Laterality: N/A;   TRIGGER FINGER RELEASE Left 11/11/2013   Procedure: RELEASE A-1 PULLEY LEFT RING FINGER;  Surgeon: Wynonia Sours, MD;  Location: [place];  Service: Orthopedics;  Laterality: Left;   UMBILICAL HERNIA REPAIR  2008, 2010    Family History  Problem Relation Age of Onset   Heart failure Father    Heart disease Brother    Heart attack Brother    Heart failure Paternal Grandfather    Stroke Paternal Grandfather    Atrial fibrillation Mother    Heart attack Mother    Diabetes Maternal Grandmother    Social History   Socioeconomic History   Marital status: Married    Spouse name: Not on file   Number of children: Not on file   Years of education: Not on file   Highest education level: Not on file  Occupational  History   Occupation: retired  Tobacco Use   Smoking status: Never   Smokeless tobacco: Never  Vaping Use   Vaping Use: Never used  Substance and Sexual Activity   Alcohol use: Yes    Alcohol/week: 3.0 - 4.0 standard drinks    Types: 1 - 2 Glasses of wine, 2 Shots of liquor per week    Comment: 4 times a week   Drug use: No   Sexual activity: Not on file  Other Topics Concern   Not on file  Social History Narrative   Not on file   Social Determinants of Health   Financial Resource Strain: Not on file  Food Insecurity: No Food Insecurity   Worried About Running Out of Food in the Last Year: Never true   Ran Out of Food in the Last Year: Never true  Transportation Needs: No Transportation Needs   Lack of Transportation (Medical): No   Lack of Transportation (Non-Medical): No  Physical Activity: Not on file  Stress: Not on file  Social Connections: Not on file    Review of Systems  Constitutional:  Negative for chills, fatigue and fever.  HENT:  Positive for congestion. Negative for ear pain and sore throat.   Respiratory:  Negative for cough and shortness of breath.   Cardiovascular:  Negative for chest pain.  Gastrointestinal:  Negative for abdominal pain, constipation, diarrhea, nausea and vomiting.  Endocrine: Positive for polydipsia, polyphagia and polyuria.  Genitourinary:  Positive for dysuria, frequency and vaginal discharge. Negative for urgency.  Musculoskeletal:  Negative for arthralgias and myalgias.  Skin:  Negative for rash.  Neurological:  Negative for dizziness and headaches.  Psychiatric/Behavioral:  Positive for depression. Negative for dysphoric mood. The patient is not nervous/anxious.     Objective:  There were no vitals taken for this visit.  BP/Weight 02/14/2021 01/31/2021 [id number]  Systolic BP [id number]  Diastolic BP 76 72 70  Wt. (Lbs) 181 188 183  BMI 27.52 28.59 27.83    Physical Exam Vitals reviewed.  Constitutional:       Appearance: Normal appearance. She is normal weight.  Neck:     Vascular:  No carotid bruit.  Cardiovascular:     Rate and Rhythm: Normal rate and regular rhythm.     Heart sounds: Normal heart sounds.  Pulmonary:     Effort: Pulmonary effort is normal. No respiratory distress.     Breath sounds: Normal breath sounds.  Abdominal:     General: Abdomen is flat. Bowel sounds are normal.     Palpations: Abdomen is soft.     Tenderness: There is no abdominal tenderness.  Neurological:     Mental Status: She is alert and oriented to person, place, and time.  Psychiatric:        Mood and Affect: Mood normal.        Behavior: Behavior normal.    Diabetic Foot Exam - Simple   Simple Foot Form  03/08/2021 10:08 PM  Visual Inspection No deformities, no ulcerations, no other skin breakdown bilaterally: Yes Sensation Testing Intact to touch and monofilament testing bilaterally: Yes Pulse Check Posterior Tibialis and Dorsalis pulse intact bilaterally: Yes Comments      Lab Results  Component Value Date   WBC 6.9 03/10/2021   HGB 13.6 03/10/2021   HCT 40.8 03/10/2021   PLT 309 03/10/2021   GLUCOSE 101 (H) 03/10/2021   CHOL 187 03/10/2021   TRIG 181 (H) 03/10/2021   HDL 62 03/10/2021   LDLCALC 94 03/10/2021   ALT 18 03/10/2021   AST 17 03/10/2021   NA 143 03/10/2021   K 5.3 (H) 03/10/2021   CL 105 03/10/2021   CREATININE 0.86 03/10/2021   BUN 14 03/10/2021   CO2 22 03/10/2021   TSH 3.900 01/31/2021   HGBA1C 6.1 (H) 03/10/2021   MICROALBUR 150 05/27/2020      Assessment & Plan:   1. Leukocytes in urine. Trace. Await culture results - Urine Culture - POCT urinalysis dipstick  2. Diabetic glomerulopathy (HCC) Control: good Recommend check feet daily. Recommend annual eye exams. Medicines: continue current medications.  Continue to work on eating a healthy diet and exercise.  Labs to be drawn later this week.   - CBC with Differential/Platelet; Future - Hemoglobin  A1c; Future - Comprehensive metabolic panel; Future - microalbumin 80  3. Secondary hypothyroidism The current medical regimen is effective;  continue present plan and medications.  4. Mixed hyperlipidemia Fairly well controlled.  No changes to medicines.  Continue to work on eating a healthy diet and exercise.  Labs drawn today.  - Lipid panel; Future  5. Depression, major, recurrent, mild ([place]) Greatly improved. The current medical regimen is effective;  continue present plan and medications.  6. Generalized anxiety disorder  Greatly improved.   Meds ordered this encounter  Medications   DISCONTD: brexpiprazole (REXULTI) 1 MG TABS tablet    Sig: Take 1 tablet (1 mg total) by mouth daily.    Dispense:  90 tablet    Refill:  1   venlafaxine XR (EFFEXOR XR) 150 MG 24 hr capsule    Sig: Take 1 capsule (150 mg total) by mouth daily with breakfast.    Dispense:  90 capsule    Refill:  1   losartan (COZAAR) 50 MG tablet    Sig: Take 1 tablet (50 mg total) by mouth daily.    Orders Placed This Encounter  Procedures   Urine Culture   CBC with Differential/Platelet   Hemoglobin A1c   Comprehensive metabolic panel   Lipid panel   POCT urinalysis dipstick     Follow-up: Return in about 2 days (around 03/10/2021) for  fasting labwork.  An After Visit Summary was printed and given to the patient.  [name], MD [facility] [phone]

## 2021-03-10 ENCOUNTER — Other Ambulatory Visit: Payer: Medicare Other

## 2021-03-10 DIAGNOSIS — E1121 Type 2 diabetes mellitus with diabetic nephropathy: Secondary | ICD-10-CM | POA: Diagnosis not present

## 2021-03-10 DIAGNOSIS — E782 Mixed hyperlipidemia: Secondary | ICD-10-CM | POA: Diagnosis not present

## 2021-03-10 LAB — URINE CULTURE

## 2021-03-11 LAB — COMPREHENSIVE METABOLIC PANEL
ALT: 18 IU/L (ref 0–32)
AST: 17 IU/L (ref 0–40)
Albumin/Globulin Ratio: 2 (ref 1.2–2.2)
Albumin: 4.3 g/dL (ref 3.7–4.7)
Alkaline Phosphatase: 74 IU/L (ref 44–121)
BUN/Creatinine Ratio: 16 (ref 12–28)
BUN: 14 mg/dL (ref 8–27)
Bilirubin Total: 0.5 mg/dL (ref 0.0–1.2)
CO2: 22 mmol/L (ref 20–29)
Calcium: 9.6 mg/dL (ref 8.7–10.3)
Chloride: 105 mmol/L (ref 96–106)
Creatinine, Ser: 0.86 mg/dL (ref 0.57–1.00)
Globulin, Total: 2.1 g/dL (ref 1.5–4.5)
Glucose: 101 mg/dL — ABNORMAL HIGH (ref 65–99)
Potassium: 5.3 mmol/L — ABNORMAL HIGH (ref 3.5–5.2)
Sodium: 143 mmol/L (ref 134–144)
Total Protein: 6.4 g/dL (ref 6.0–8.5)
eGFR: 72 mL/min/{1.73_m2} (ref >59)

## 2021-03-11 LAB — CBC WITH DIFFERENTIAL/PLATELET
Basophils Absolute: 0.1 10*3/uL (ref 0.0–0.2)
Basos: 1 %
EOS (ABSOLUTE): 0.3 10*3/uL (ref 0.0–0.4)
Eos: 4 %
Hematocrit: 40.8 % (ref 34.0–46.6)
Hemoglobin: 13.6 g/dL (ref 11.1–15.9)
Immature Grans (Abs): 0 10*3/uL (ref 0.0–0.1)
Immature Granulocytes: 0 %
Lymphocytes Absolute: 2.9 10*3/uL (ref 0.7–3.1)
Lymphs: 43 %
MCH: 31.5 pg (ref 26.6–33.0)
MCHC: 33.3 g/dL (ref 31.5–35.7)
MCV: 94 fL (ref 79–97)
Monocytes Absolute: 0.5 10*3/uL (ref 0.1–0.9)
Monocytes: 8 %
Neutrophils Absolute: 3 10*3/uL (ref 1.4–7.0)
Neutrophils: 44 %
Platelets: 309 10*3/uL (ref 150–450)
RBC: 4.32 x10E6/uL (ref 3.77–5.28)
RDW: 11.9 % (ref 11.7–15.4)
WBC: 6.9 10*3/uL (ref 3.4–10.8)

## 2021-03-11 LAB — LIPID PANEL
Chol/HDL Ratio: 3 ratio (ref 0.0–4.4)
Cholesterol, Total: 187 mg/dL (ref 100–199)
HDL: 62 mg/dL (ref >39)
LDL Chol Calc (NIH): 94 mg/dL (ref 0–99)
Triglycerides: 181 mg/dL — ABNORMAL HIGH (ref 0–149)
VLDL Cholesterol Cal: 31 mg/dL (ref 5–40)

## 2021-03-11 LAB — CARDIOVASCULAR RISK ASSESSMENT

## 2021-03-11 LAB — HEMOGLOBIN A1C
Est. average glucose Bld gHb Est-mCnc: 128 mg/dL
Hgb A1c MFr Bld: 6.1 % — ABNORMAL HIGH (ref 4.8–5.6)

## 2021-03-14 ENCOUNTER — Ambulatory Visit: Payer: Medicare Other | Admitting: Cardiology

## 2021-03-14 ENCOUNTER — Other Ambulatory Visit (HOSPITAL_COMMUNITY): Payer: Self-pay | Admitting: Nurse Practitioner

## 2021-03-14 ENCOUNTER — Encounter: Payer: Self-pay | Admitting: Family Medicine

## 2021-03-14 MED ORDER — LOSARTAN POTASSIUM 50 MG PO TABS: 50.0000 mg | ORAL_TABLET | Freq: Every day | ORAL | Status: DC

## 2021-03-14 NOTE — Telephone Encounter (Signed)
Prescription refill request for Xarelto received.  Indication: Afib Last office visit: 01/03/21 Curt Bears)  Weight: 82.1kg Age: 70 Scr: 0.86 (03/10/21) CrCl: 90.16  Appropriate dose and refill sent to requested pharmacy.

## 2021-03-15 LAB — POCT UA - MICROALBUMIN: Microalbumin Ur, POC: 80 mg/L

## 2021-03-17 DIAGNOSIS — M1712 Unilateral primary osteoarthritis, left knee: Secondary | ICD-10-CM | POA: Diagnosis not present

## 2021-03-19 ENCOUNTER — Other Ambulatory Visit: Payer: Self-pay | Admitting: Physician Assistant

## 2021-03-24 ENCOUNTER — Other Ambulatory Visit: Payer: Self-pay | Admitting: Physician Assistant

## 2021-03-24 ENCOUNTER — Other Ambulatory Visit: Payer: Self-pay | Admitting: Nurse Practitioner

## 2021-03-24 DIAGNOSIS — F322 Major depressive disorder, single episode, severe without psychotic features: Secondary | ICD-10-CM

## 2021-03-24 DIAGNOSIS — E559 Vitamin D deficiency, unspecified: Secondary | ICD-10-CM

## 2021-03-24 DIAGNOSIS — F419 Anxiety disorder, unspecified: Secondary | ICD-10-CM

## 2021-03-25 ENCOUNTER — Ambulatory Visit: Payer: Medicare Other | Admitting: Pulmonary Disease

## 2021-03-28 ENCOUNTER — Telehealth: Payer: Self-pay

## 2021-03-28 NOTE — Chronic Care Management (AMB) (Signed)
Chronic Care Management Pharmacy Assistant   Name: April Munoz  MRN: [id number] DOB: 1950/11/09   Reason for Encounter: Disease State/ Diabetes  Recent office visits:  03-08-2021 [name], MD. Glucose= 101, Potassium= 5.3. A1C= 6.1. Trig= 181. Micro albumin= 80. Glucose, UA= Positive, Protein, UA= Positive, Leukocytes= Trace. CHANGE Losartan 50 mg twice daily TO 50 mg daily.   02-14-2021 Rip Harbour, NP. STOP Trintellix 20 mg. START Effexor XR 75 mg Take 1 capsule (75 mg total) by mouth daily with breakfast for 7 days, THEN 2 capsules (150 mg total) daily with breakfast for 23 days. Referral placed to Psychiatry.  01-31-2021 Rip Harbour, NP. CHANGE Rexulti 0.5 mg daily TO 1 mg daily.  Recent consult visits:  01-26-2021 Pamala Hurry, MD (Orthopedic). Joint injection in left knee.  01-13-2021 Estrella Myrtle, MD (Chiropractor). Electric stimulation therapy.  01-11-2021 Misenheimer, Christia Reading, MD Gertie Fey). Midazolam hydrochloride injection. Tumor removal.  01-05-2021 Gae Dry, DPM (Podiatry). STOP Fosamax, Bystolic, Rythmol, Eliquis.  01-05-2021 Estrella Myrtle, MD (Chiropractor). Electric stimulation therapy.  01-03-2021 Constance Haw, MD (Cardiology). EKG ordered.  Hospital visits:  None in previous 6 months  Medications: Outpatient Encounter Medications as of 03/28/2021  Medication Sig Note   XARELTO 20 MG TABS tablet TAKE ONE (1) TABLET ONCE DAILY WITH SUPPER    acyclovir (ZOVIRAX) 800 MG tablet TAKE ONE (1) TABLET FOUR (4) TIMES DAILYAS NEEDED FOR SHINGLES (Patient taking differently: Take 800 mg by mouth daily as needed (Shingles).)    atorvastatin (LIPITOR) 10 MG tablet TAKE ONE (1) TABLET BY MOUTH ONCE DAILY    B Complex-C-Folic Acid TABS Take 1 tablet by mouth daily.    brexpiprazole (REXULTI) 1 MG TABS tablet Take 1 tablet (1 mg total) by mouth daily.    Calcium Carb-Cholecalciferol (LIQUID CALCIUM WITH D3) [phone] MG-UNIT CAPS  Take 600 mg by mouth daily.    Calcium-Vitamin D-Vitamin K (VIACTIV CALCIUM PLUS D) 650-12.5-40 MG-MCG-MCG CHEW Chew 1 capsule by mouth daily.    Coenzyme Q10 100 MG TABS Take 1 tablet by mouth daily.    Continuous Blood Gluc Receiver (FREESTYLE LIBRE 2 READER) DEVI E11.21 Check blood sugar 4 times daily as directed    Continuous Blood Gluc Sensor (FREESTYLE LIBRE 2 SENSOR) MISC E11.21 Change sensor every 14 days as directed    cyanocobalamin (,VITAMIN B-12,) 1000 MCG/ML injection Inject 1 mL (1,000 mcg total) into the muscle every 30 (thirty) days.    dapagliflozin propanediol (FARXIGA) 10 MG TABS tablet Take 10 mg by mouth daily.    Dulaglutide (TRULICITY) A999333 0000000 SOPN Inject 0.75 mg into the skin once a week.    EPINEPHrine 0.3 mg/0.3 mL IJ SOAJ injection Inject 0.3 mg into the muscle as needed. 11/22/2020: Pharmacy is out at this time   flecainide (TAMBOCOR) 100 MG tablet Take 1 tablet (100 mg total) by mouth 2 (two) times daily.    levothyroxine (SYNTHROID) 75 MCG tablet TAKE ONE (1) TABLET ONCE DAILY BEFORE BREAKFAST    losartan (COZAAR) 50 MG tablet Take 1 tablet (50 mg total) by mouth daily.    Lutein 20 MG TABS Take 1 tablet by mouth daily.    Melatonin 10 MG TABS Take 10 mg by mouth 4 (four) times a week.    metFORMIN (GLUCOPHAGE) 1000 MG tablet TAKE ONE TABLET TWICE DAILY WITH MEALS    metoprolol succinate (TOPROL-XL) 25 MG 24 hr tablet TAKE 1 TABLET BY MOUTH IN THE MORNING AND 1 TABLET AT BEDTIME. (Patient taking differently:  Take 25 mg by mouth 2 (two) times daily.)    Multiple Vitamin (MULTIVITAMIN WITH MINERALS) TABS tablet Take 1 tablet by mouth daily.    omega-3 acid ethyl esters (LOVAZA) 1 g capsule Take 2 capsules by mouth 2 (two) times daily.    omeprazole (PRILOSEC) 20 MG capsule TAKE ONE (1) CAPSULE EACH DAY    Probiotic Product (PROBIOTIC & ACIDOPHILUS EX ST PO) Take 1 tablet by mouth daily.     torsemide (DEMADEX) 20 MG tablet Take 1 tablet (20 mg total) by mouth 3  (three) times a week. Please take this on Monday, Wednesday, and Friday. (Patient taking differently: Take 20 mg by mouth every Monday, Wednesday, and Friday.)    venlafaxine XR (EFFEXOR XR) 150 MG 24 hr capsule Take 1 capsule (150 mg total) by mouth daily with breakfast.    venlafaxine XR (EFFEXOR-XR) 75 MG 24 hr capsule TAKE 1 CAPSULE DAILY WITH BREAKFAST FOR 7 DAYS; THEN 2 CAPSULES DAILY WITH BREAKFAST FOR 23 DAYS.    Vitamin D, Ergocalciferol, (DRISDOL) 1.25 MG (50000 UNIT) CAPS capsule Take 1 capsule (50,000 Units total) by mouth every 7 (seven) days.    Zinc Sulfate (ZINC 15 PO) Take 150 mg by mouth 3 (three) times a week.    zolpidem (AMBIEN) 10 MG tablet Take 1 tablet (10 mg total) by mouth at bedtime as needed for sleep.    No facility-administered encounter medications on file as of 03/28/2021.   Recent Relevant Labs: Lab Results  Component Value Date/Time   HGBA1C 6.1 (H) 03/10/2021 09:06 AM   HGBA1C 7.1 (H) 09/03/2020 09:53 AM   MICROALBUR 80 03/15/2021 10:11 PM   MICROALBUR 150 05/27/2020 09:39 AM    Kidney Function Lab Results  Component Value Date/Time   CREATININE 0.86 03/10/2021 09:06 AM   CREATININE 0.90 11/26/2020 09:26 AM   GFRNONAA 53 (L) 09/03/2020 09:53 AM   GFRAA 61 09/03/2020 09:53 AM     Current antihyperglycemic regimen:  metformin 1000 mg daily  Farxiga 5 mg daily  Trulicity A999333 mg weekly     Patient verbally confirms she is taking the above medications as directed. Yes  What recent interventions/DTPs have been made to improve glycemic control:  Educated on A1c and blood sugar goals Complications of diabetes including kidney damage, retinal damage, and cardiovascular disease Benefits of weight loss Benefits of routine self-monitoring of blood sugar; Counseled to check feet daily and get yearly eye exams Counseled on diet and exercise extensively Recommended increasing Farxiga 10 mg daily. Begin taking metformin 1000 mg daily. Will consider increase  in Trulicity at next visit if blood sugar remains above goal. Patient follows-up with GI in  June and has a history of stomach issues. Choosing to increase Farxiga first to avoid GI adverse events.   Have there been any recent hospitalizations or ED visits since last visit with CPP? No  Patient denies hypoglycemic symptoms  Patient reports hyperglycemic symptoms, including fatigue and weakness  How often are you checking your blood sugar? once daily  What are your blood sugars ranging?  Fasting: 125, 115 Before meals: None After meals: 193 Bedtime: None  On insulin? No  During the week, how often does your blood glucose drop below 70? Never  Are you checking your feet daily/regularly? Yes  Adherence Review: Is the patient currently on a STATIN medication? Yes Is the patient currently on ACE/ARB medication? Yes Does the patient have >5 day gap between last estimated fill dates? CPP to review  NOTES:  Patient states she would like to enroll in patient assistance for Xarelto and [place]. Instructed patient that I will start process, Pattricia Boss PTM will print and mail applications and after filling out her portion to drop off applications and income verification at the office to be faxed. Completed trulicity renewal application.  Care Gaps: Last eye exam / Retinopathy Screening? Patient states about 6 months ago Last Annual Wellness Visit? none Last Diabetic Foot Exam? Patient states about six months ago Shingrix overdue Hep C screening overdue Tdap overdue Yearly mammogram overdue Covid booster overdue RAF= 2.716%  Star Rating Drugs:  Medication:  Last Fill: Day Supply Losartan 50 mg 03-14-2021 90 Farxiga 5 mg  0000000 30 Trulicity A999333 mg Patient assistance  Metformin 1000 mg 03-25-2021 30 Atorvastatin 10 mg 02-09-2021 [place] Clinical Pharmacist Assistant [phone]

## 2021-04-01 DIAGNOSIS — Z1231 Encounter for screening mammogram for malignant neoplasm of breast: Secondary | ICD-10-CM | POA: Diagnosis not present

## 2021-04-01 DIAGNOSIS — M85851 Other specified disorders of bone density and structure, right thigh: Secondary | ICD-10-CM | POA: Diagnosis not present

## 2021-04-01 DIAGNOSIS — M810 Age-related osteoporosis without current pathological fracture: Secondary | ICD-10-CM | POA: Diagnosis not present

## 2021-04-01 LAB — HM MAMMOGRAPHY

## 2021-04-01 LAB — HM DEXA SCAN

## 2021-04-13 ENCOUNTER — Encounter: Payer: Self-pay | Admitting: Family Medicine

## 2021-04-13 ENCOUNTER — Other Ambulatory Visit: Payer: Self-pay

## 2021-04-13 ENCOUNTER — Ambulatory Visit (INDEPENDENT_AMBULATORY_CARE_PROVIDER_SITE_OTHER): Payer: Medicare Other | Admitting: Family Medicine

## 2021-04-13 VITALS — BP 124/68 | HR 80 | Temp 96.2°F | Ht 68.0 in | Wt 180.0 lb

## 2021-04-13 DIAGNOSIS — R350 Frequency of micturition: Secondary | ICD-10-CM | POA: Diagnosis not present

## 2021-04-13 DIAGNOSIS — N3941 Urge incontinence: Secondary | ICD-10-CM

## 2021-04-13 LAB — POCT URINALYSIS DIPSTICK
Bilirubin, UA: NEGATIVE
Blood, UA: NEGATIVE
Glucose, UA: POSITIVE — AB
Ketones, UA: NEGATIVE
Leukocytes, UA: NEGATIVE
Nitrite, UA: NEGATIVE
Protein, UA: POSITIVE — AB
Spec Grav, UA: 1.02 (ref 1.010–1.025)
Urobilinogen, UA: 1 E.U./dL
pH, UA: 6 (ref 5.0–8.0)

## 2021-04-13 MED ORDER — SOLIFENACIN SUCCINATE 5 MG PO TABS: 5.0000 mg | ORAL_TABLET | Freq: Every day | ORAL | 2 refills | Status: DC

## 2021-04-13 NOTE — Patient Instructions (Signed)
Start on vesicare 5 mg once daily.

## 2021-04-13 NOTE — Assessment & Plan Note (Signed)
vesicare

## 2021-04-13 NOTE — Progress Notes (Signed)
Acute Office Visit  Subjective:    Patient ID: April Munoz, female    DOB: [birth year], 70 y.o.   MRN: [id number]  Chief Complaint  Patient presents with   Urinary Frequency    HPI Patient is in today for urinary sxs. C/o frequent urination for the past 5 months contributed it to getting older, however for the past month she has also had some urinary incontinence. States at times she will suddenly have the urge to go urinate and before she can make it to the restroom she will have had a accident on herself.  Past Medical History:  Diagnosis Date   Anxiety    Arthritis    Ataxic gait    Atrial fibrillation (HCC)    Atrophy of thyroid    B12 deficiency 10/31/2017   Cellulitis of abdominal wall 07/07/2016   Chronic anticoagulation 05/31/2018   Depression    Depression, major, recurrent, mild ([place]) 09/16/2019   Diabetes mellitus without complication ([place])    Diabetic glomerulopathy ([place]) 09/16/2019   Esophageal stricture 06/05/2017   Essential hypertension 02/24/2013   Fatigue 01/20/2020   Fibromyalgia 02/24/2013   Full dentures    Hereditary and idiopathic peripheral neuropathy 02/24/2013   High risk medication use 11/07/2017   Hyperlipidemia 02/24/2013   Hypertension    Hypertensive heart disease 02/24/2013   Incontinence    Insomnia    Longstanding persistent atrial fibrillation ([place]) 09/16/2019   Mild vitamin D deficiency 09/16/2019   Mixed hyperlipidemia 02/24/2013   Murmur, cardiac 08/15/2015   Neuropathy in diabetes ([place]) 10/31/2017   Osteoporosis    Other amnesia    Other amnesia    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    Paroxysmal atrial fibrillation ([place]) 08/15/2015   CHADS2vasc=3 CHADS2vasc=3   Primary insomnia    Restless legs syndrome 03/14/2013   Secondary hypothyroidism 09/16/2019   Thoracic or lumbosacral neuritis or radiculitis 02/24/2013   Type 2 diabetes mellitus, without long-term current use of insulin ([place]) 02/24/2013   Urge incontinence of  urine 09/16/2019   Wears glasses     Past Surgical History:  Procedure Laterality Date   A-FLUTTER ABLATION N/A 07/30/2019   Procedure: A-FLUTTER ABLATION;  Surgeon: Constance Haw, MD;  Location: [place] [facility];  Service: Cardiovascular;  Laterality: N/A;   ATRIAL FIBRILLATION ABLATION N/A 03/21/2019   Procedure: ATRIAL FIBRILLATION ABLATION;  Surgeon: Constance Haw, MD;  Location: [place] [facility];  Service: Cardiovascular;  Laterality: N/A;   [place] STUDY  11/26/2020   Procedure: BUBBLE STUDY;  Surgeon: Pixie Casino, MD;  Location: [facility] ENDOSCOPY;  Service: Cardiovascular;;   CARDIOVERSION N/A 11/26/2020   Procedure: CARDIOVERSION;  Surgeon: Pixie Casino, MD;  Location: [facility] ENDOSCOPY;  Service: Cardiovascular;  Laterality: N/A;   CATARACT EXTRACTION, BILATERAL  2018, 2019   CHOLECYSTECTOMY  1971   open   DIAGNOSTIC LAPAROSCOPY  2010   lysis of adhesions   DILATION AND CURETTAGE OF UTERUS  2004   KNEE ARTHROSCOPY  1998   left   KNEE ARTHROSCOPY W/ LATERAL RETINACULAR REPAIR     MASS EXCISION Left 11/11/2013   Procedure: EXCISION MUCOID CYST LEFT INDEX FINGER/DEBRIDEMENT LEFT INDEX FINGER;  Surgeon: Wynonia Sours, MD;  Location: [place];  Service: Orthopedics;  Laterality: Left;  ANESTHESIA: IV REGIONAL/FAB   SHOULDER ARTHROSCOPY W/ ROTATOR CUFF REPAIR  2007   left   TEE WITHOUT CARDIOVERSION N/A 11/26/2020   Procedure:  TRANSESOPHAGEAL ECHOCARDIOGRAM (TEE);  Surgeon: Pixie Casino, MD;  Location[facility] ENDOSCOPY;  Service: Cardiovascular;  Laterality: N/A;   TRIGGER FINGER RELEASE Left 11/11/2013   Procedure: RELEASE A-1 PULLEY LEFT RING FINGER;  Surgeon: Wynonia Sours, MD;  Location: [place];  Service: Orthopedics;  Laterality: Left;   UMBILICAL HERNIA REPAIR  2008, 2010    Family History  Problem Relation Age of Onset   Heart failure Father    Heart disease Brother    Heart attack Brother     Heart failure Paternal Grandfather    Stroke Paternal Grandfather    Atrial fibrillation Mother    Heart attack Mother    Diabetes Maternal Grandmother     Social History   Socioeconomic History   Marital status: Married    Spouse name: Not on file   Number of children: Not on file   Years of education: Not on file   Highest education level: Not on file  Occupational History   Occupation: retired  Tobacco Use   Smoking status: Never   Smokeless tobacco: Never  Vaping Use   Vaping Use: Never used  Substance and Sexual Activity   Alcohol use: Yes    Alcohol/week: 3.0 - 4.0 standard drinks    Types: 1 - 2 Glasses of wine, 2 Shots of liquor per week    Comment: 4 times a week   Drug use: No   Sexual activity: Not on file  Other Topics Concern   Not on file  Social History Narrative   Not on file   Social Determinants of Health   Financial Resource Strain: Not on file  Food Insecurity: No Food Insecurity   Worried About Running Out of Food in the Last Year: Never true   Ran Out of Food in the Last Year: Never true  Transportation Needs: No Transportation Needs   Lack of Transportation (Medical): No   Lack of Transportation (Non-Medical): No  Physical Activity: Not on file  Stress: Not on file  Social Connections: Not on file  Intimate Partner Violence: Not on file    Outpatient Medications Prior to Visit  Medication Sig Dispense Refill   XARELTO 20 MG TABS tablet TAKE ONE (1) TABLET ONCE DAILY WITH SUPPER 30 tablet 3   acyclovir (ZOVIRAX) 800 MG tablet TAKE ONE (1) TABLET FOUR (4) TIMES DAILYAS NEEDED FOR SHINGLES (Patient taking differently: Take 800 mg by mouth daily as needed (Shingles).) 120 tablet 0   atorvastatin (LIPITOR) 10 MG tablet TAKE ONE (1) TABLET BY MOUTH ONCE DAILY 90 tablet 0   B Complex-C-Folic Acid TABS Take 1 tablet by mouth daily.     brexpiprazole (REXULTI) 1 MG TABS tablet Take 1 tablet (1 mg total) by mouth daily. 90 tablet 1   Calcium  Carb-Cholecalciferol (LIQUID CALCIUM WITH D3) [phone] MG-UNIT CAPS Take 600 mg by mouth daily.     Calcium-Vitamin D-Vitamin K (VIACTIV CALCIUM PLUS D) 650-12.5-40 MG-MCG-MCG CHEW Chew 1 capsule by mouth daily.     Coenzyme Q10 100 MG TABS Take 1 tablet by mouth daily.     Continuous Blood Gluc Receiver (FREESTYLE LIBRE 2 READER) DEVI E11.21 Check blood sugar 4 times daily as directed 1 each 0   Continuous Blood Gluc Sensor (FREESTYLE LIBRE 2 SENSOR) MISC E11.21 Change sensor every 14 days as directed 6 each 3   cyanocobalamin (,VITAMIN B-12,) 1000 MCG/ML injection Inject 1 mL (1,000 mcg total) into the muscle every 30 (thirty) days. 1 mL 2  dapagliflozin propanediol (FARXIGA) 10 MG TABS tablet Take 10 mg by mouth daily.     Dulaglutide (TRULICITY) 9.39 QZ/0.0PQ SOPN Inject 0.75 mg into the skin once a week. 6 mL 0   EPINEPHrine 0.3 mg/0.3 mL IJ SOAJ injection Inject 0.3 mg into the muscle as needed. 1 each 1   flecainide (TAMBOCOR) 100 MG tablet Take 1 tablet (100 mg total) by mouth 2 (two) times daily. 60 tablet 3   levothyroxine (SYNTHROID) 75 MCG tablet TAKE ONE (1) TABLET ONCE DAILY BEFORE BREAKFAST 90 tablet 1   losartan (COZAAR) 50 MG tablet Take 1 tablet (50 mg total) by mouth daily.     Lutein 20 MG TABS Take 1 tablet by mouth daily.     Melatonin 10 MG TABS Take 10 mg by mouth 4 (four) times a week.     metFORMIN (GLUCOPHAGE) 1000 MG tablet TAKE ONE TABLET TWICE DAILY WITH MEALS 90 tablet 0   metoprolol succinate (TOPROL-XL) 25 MG 24 hr tablet TAKE 1 TABLET BY MOUTH IN THE MORNING AND 1 TABLET AT BEDTIME. (Patient taking differently: Take 25 mg by mouth 2 (two) times daily.) 60 tablet 11   Multiple Vitamin (MULTIVITAMIN WITH MINERALS) TABS tablet Take 1 tablet by mouth daily.     omega-3 acid ethyl esters (LOVAZA) 1 g capsule Take 2 capsules by mouth 2 (two) times daily.     omeprazole (PRILOSEC) 20 MG capsule TAKE ONE (1) CAPSULE EACH DAY 90 capsule 0   Probiotic Product (PROBIOTIC &  ACIDOPHILUS EX ST PO) Take 1 tablet by mouth daily.      torsemide (DEMADEX) 20 MG tablet Take 1 tablet (20 mg total) by mouth 3 (three) times a week. Please take this on Monday, Wednesday, and Friday. (Patient taking differently: Take 20 mg by mouth every Monday, Wednesday, and Friday.) 90 tablet 0   venlafaxine XR (EFFEXOR XR) 150 MG 24 hr capsule Take 1 capsule (150 mg total) by mouth daily with breakfast. 90 capsule 1   venlafaxine XR (EFFEXOR-XR) 75 MG 24 hr capsule TAKE 1 CAPSULE DAILY WITH BREAKFAST FOR 7 DAYS; THEN 2 CAPSULES DAILY WITH BREAKFAST FOR 23 DAYS. 53 capsule 0   Vitamin D, Ergocalciferol, (DRISDOL) 1.25 MG (50000 UNIT) CAPS capsule Take 1 capsule (50,000 Units total) by mouth every 7 (seven) days. 5 capsule 5   Zinc Sulfate (ZINC 15 PO) Take 150 mg by mouth 3 (three) times a week.     zolpidem (AMBIEN) 10 MG tablet Take 1 tablet (10 mg total) by mouth at bedtime as needed for sleep. 30 tablet 2   No facility-administered medications prior to visit.    Allergies  Allergen Reactions   Prednisone     In high doses causes her to feel crazy    Review of Systems  Constitutional:  Negative for chills and fever.  HENT:  Negative for congestion and sore throat.   Respiratory:  Negative for cough and shortness of breath.   Cardiovascular:  Negative for chest pain.  Gastrointestinal:  Negative for abdominal pain and nausea.       Abdominal pressure  Endocrine: Negative for polyuria.  Genitourinary:  Positive for frequency and urgency. Negative for difficulty urinating, dysuria and flank pain.  Musculoskeletal:  Negative for back pain.      Objective:    Physical Exam Vitals reviewed.  Constitutional:      Appearance: Normal appearance. She is normal weight.  Cardiovascular:     Rate and Rhythm: Normal rate and regular  rhythm.     Heart sounds: Normal heart sounds.  Pulmonary:     Effort: Pulmonary effort is normal. No respiratory distress.     Breath sounds: Normal  breath sounds.  Abdominal:     General: Abdomen is flat. Bowel sounds are normal.     Palpations: Abdomen is soft.     Tenderness: There is no abdominal tenderness.  Neurological:     Mental Status: She is alert and oriented to person, place, and time.  Psychiatric:        Mood and Affect: Mood normal.        Behavior: Behavior normal.    BP 124/68   Pulse 80   Temp (!) 96.2 F (35.7 C)   Ht _0  (1.727 m)   Wt 180 lb (81.6 kg)   SpO2 100%   BMI 27.37 kg/m  Wt Readings from Last 3 Encounters:  04/13/21 180 lb (81.6 kg)  03/15/21 184 lb (83.5 kg)  02/14/21 181 lb (82.1 kg)    Health Maintenance Due  Topic Date Due   Hepatitis C Screening  Never done   TETANUS/TDAP  Never done   Zoster Vaccines- Shingrix (1 of 2) Never done   COVID-19 Vaccine (4 - Booster for Moderna series) 07/12/2020   COLONOSCOPY (Pts 45-94yr Insurance coverage will need to be confirmed)  12/01/2020   INFLUENZA VACCINE  02/14/2021    There are no preventive care reminders to display for this patient.   Lab Results  Component Value Date   TSH 3.900 01/31/2021   Lab Results  Component Value Date   WBC 6.9 03/10/2021   HGB 13.6 03/10/2021   HCT 40.8 03/10/2021   MCV 94 03/10/2021   PLT 309 03/10/2021   Lab Results  Component Value Date   NA 143 03/10/2021   K 5.3 (H) 03/10/2021   CO2 22 03/10/2021   GLUCOSE 101 (H) 03/10/2021   BUN 14 03/10/2021   CREATININE 0.86 03/10/2021   BILITOT 0.5 03/10/2021   ALKPHOS 74 03/10/2021   AST 17 03/10/2021   ALT 18 03/10/2021   PROT 6.4 03/10/2021   ALBUMIN 4.3 03/10/2021   CALCIUM 9.6 03/10/2021   ANIONGAP 11 03/21/2019   EGFR 72 03/10/2021   Lab Results  Component Value Date   CHOL 187 03/10/2021   Lab Results  Component Value Date   HDL 62 03/10/2021   Lab Results  Component Value Date   LDLCALC 94 03/10/2021   Lab Results  Component Value Date   TRIG 181 (H) 03/10/2021   Lab Results  Component Value Date   CHOLHDL 3.0  03/10/2021   Lab Results  Component Value Date   HGBA1C 6.1 (H) 03/10/2021       Assessment & Plan:  1. Urinary frequency - POCT urinalysis dipstick  2. Urge incontinence - solifenacin (VESICARE) 5 MG tablet; Take 1 tablet (5 mg total) by mouth daily.  Dispense: 30 tablet; Refill: 2   Meds ordered this encounter  Medications   solifenacin (VESICARE) 5 MG tablet    Sig: Take 1 tablet (5 mg total) by mouth daily.    Dispense:  30 tablet    Refill:  2     Orders Placed This Encounter  Procedures   POCT urinalysis dipstick     Follow-up: No follow-ups on file.  An After Visit Summary was printed and given to the patient.  KRochel Brome MD [facility] ([phone]

## 2021-04-13 NOTE — Assessment & Plan Note (Signed)
Start vesicare 5 mg once daily.  Reviewed side effects.  Recommended call in 1 month if not significantly or satisfactory response.

## 2021-04-18 ENCOUNTER — Other Ambulatory Visit: Payer: Self-pay

## 2021-04-18 ENCOUNTER — Other Ambulatory Visit: Payer: Self-pay | Admitting: Family Medicine

## 2021-04-18 ENCOUNTER — Encounter: Payer: Self-pay | Admitting: Cardiology

## 2021-04-18 ENCOUNTER — Ambulatory Visit (INDEPENDENT_AMBULATORY_CARE_PROVIDER_SITE_OTHER): Payer: Medicare Other | Admitting: Cardiology

## 2021-04-18 VITALS — BP 116/80 | HR 66 | Ht 68.0 in | Wt 183.0 lb

## 2021-04-18 DIAGNOSIS — E538 Deficiency of other specified B group vitamins: Secondary | ICD-10-CM

## 2021-04-18 DIAGNOSIS — I4819 Other persistent atrial fibrillation: Secondary | ICD-10-CM

## 2021-04-18 MED ORDER — METOPROLOL SUCCINATE ER 50 MG PO TB24: 50.0000 mg | ORAL_TABLET | Freq: Every day | ORAL | Status: DC

## 2021-04-18 MED ORDER — METFORMIN HCL 1000 MG PO TABS: ORAL_TABLET | ORAL | 0 refills | Status: DC

## 2021-04-18 NOTE — Progress Notes (Signed)
Electrophysiology Office Note   Date:  04/18/2021   ID:  April Munoz, DOB 1950/08/03, MRN [id number]  PCP:  [name], MD  Cardiologist: Bettina Gavia Primary Electrophysiologist:  Jaiona Simien Meredith Leeds, MD    Chief Complaint  Patient presents with   Follow-up      History of Present Illness: April Munoz is a 70 y.o. female who is being seen today for the evaluation of atrial fibrillation at the request of Cox, Elnita Maxwell, MD. Presenting today for electrophysiology evaluation.  She has a history significant for persistent atrial fibrillation, depression, diabetes, hypertension, hyperlipidemia, TIA, and atrial flutter.  She wore a 30-day monitor that showed almost entirely atrial fibrillation.  She is status post ablation 03/21/2019.  She presented with typical atrial flutter and is now post ablation 05/29/2020.  Unfortunately she has had left atrial flutter.  She is status post cardioversion 11/26/2020.  She is currently on flecainide.  Today, denies symptoms of palpitations, chest pain, shortness of breath, orthopnea, PND, lower extremity edema, claudication, dizziness, presyncope, syncope, bleeding, or neurologic sequela. The patient is tolerating medications without difficulties.  She presents to clinic today complaining of quite a bit of weakness and fatigue.  She also states that she has been having memory issues.  She had a fall a few weeks ago and feels that her memory is worse since her fall.  She was switched from Eliquis to Xarelto which her and her husband feel has improved her memory.  She is unaware of further episodes of atrial fibrillation.  She does state that her medications have gotten quite expensive.  Past Medical History:  Diagnosis Date   Anxiety    Arthritis    Ataxic gait    Atrial fibrillation (HCC)    Atrophy of thyroid    B12 deficiency 10/31/2017   Cellulitis of abdominal wall 07/07/2016   Chronic anticoagulation 05/31/2018   Depression    Depression, major,  recurrent, mild ([place]) 09/16/2019   Diabetes mellitus without complication ([place])    Diabetic glomerulopathy ([place]) 09/16/2019   Esophageal stricture 06/05/2017   Essential hypertension 02/24/2013   Fatigue 01/20/2020   Fibromyalgia 02/24/2013   Full dentures    Hereditary and idiopathic peripheral neuropathy 02/24/2013   High risk medication use 11/07/2017   Hyperlipidemia 02/24/2013   Hypertension    Hypertensive heart disease 02/24/2013   Incontinence    Insomnia    Longstanding persistent atrial fibrillation ([place]) 09/16/2019   Mild vitamin D deficiency 09/16/2019   Mixed hyperlipidemia 02/24/2013   Murmur, cardiac 08/15/2015   Neuropathy in diabetes ([place]) 10/31/2017   Osteoporosis    Other amnesia    Other amnesia    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    Paroxysmal atrial fibrillation ([place]) 08/15/2015   CHADS2vasc=3 CHADS2vasc=3   Primary insomnia    Restless legs syndrome 03/14/2013   Secondary hypothyroidism 09/16/2019   Thoracic or lumbosacral neuritis or radiculitis 02/24/2013   Type 2 diabetes mellitus, without long-term current use of insulin ([place]) 02/24/2013   Urge incontinence of urine 09/16/2019   Wears glasses    Past Surgical History:  Procedure Laterality Date   A-FLUTTER ABLATION N/A 07/30/2019   Procedure: A-FLUTTER ABLATION;  Surgeon: Constance Haw, MD;  Location: [facility] [facility];  Service: Cardiovascular;  Laterality: N/A;   ATRIAL FIBRILLATION ABLATION N/A 03/21/2019   Procedure: ATRIAL FIBRILLATION ABLATION;  Surgeon: Constance Haw, MD;  Location: [facility];  Service: Cardiovascular;  Laterality: N/A;   BREAST REDUCTION  [place] STUDY  11/26/2020   Procedure: BUBBLE STUDY;  Surgeon: Pixie Casino, MD;  Location: [facility] ENDOSCOPY;  Service: Cardiovascular;;   CARDIOVERSION N/A 11/26/2020   Procedure: CARDIOVERSION;  Surgeon: Pixie Casino, MD;  Location: [facility] ENDOSCOPY;  Service: Cardiovascular;  Laterality: N/A;    CATARACT EXTRACTION, BILATERAL  2018, 2019   CHOLECYSTECTOMY  1971   open   DIAGNOSTIC LAPAROSCOPY  2010   lysis of adhesions   DILATION AND CURETTAGE OF UTERUS  2004   KNEE ARTHROSCOPY  1998   left   KNEE ARTHROSCOPY W/ LATERAL RETINACULAR REPAIR     MASS EXCISION Left 11/11/2013   Procedure: EXCISION MUCOID CYST LEFT INDEX FINGER/DEBRIDEMENT LEFT INDEX FINGER;  Surgeon: Wynonia Sours, MD;  Location: [place];  Service: Orthopedics;  Laterality: Left;  ANESTHESIA: IV REGIONAL/FAB   SHOULDER ARTHROSCOPY W/ ROTATOR CUFF REPAIR  2007   left   TEE WITHOUT CARDIOVERSION N/A 11/26/2020   Procedure: TRANSESOPHAGEAL ECHOCARDIOGRAM (TEE);  Surgeon: Pixie Casino, MD;  Location: [facility] ENDOSCOPY;  Service: Cardiovascular;  Laterality: N/A;   TRIGGER FINGER RELEASE Left 11/11/2013   Procedure: RELEASE A-1 PULLEY LEFT RING FINGER;  Surgeon: Wynonia Sours, MD;  Location: [place];  Service: Orthopedics;  Laterality: Left;   UMBILICAL HERNIA REPAIR  2008, 2010     Current Outpatient Medications  Medication Sig Dispense Refill   acyclovir (ZOVIRAX) 800 MG tablet TAKE ONE (1) TABLET FOUR (4) TIMES DAILYAS NEEDED FOR SHINGLES 120 tablet 0   atorvastatin (LIPITOR) 10 MG tablet TAKE ONE (1) TABLET BY MOUTH ONCE DAILY 90 tablet 0   B Complex-C-Folic Acid TABS Take 1 tablet by mouth daily.     brexpiprazole (REXULTI) 1 MG TABS tablet Take 1 tablet (1 mg total) by mouth daily. 90 tablet 1   Calcium Carb-Cholecalciferol (LIQUID CALCIUM WITH D3) [phone] MG-UNIT CAPS Take 600 mg by mouth daily.     Calcium-Vitamin D-Vitamin K (VIACTIV CALCIUM PLUS D) 650-12.5-40 MG-MCG-MCG CHEW Chew 1 capsule by mouth daily.     Coenzyme Q10 100 MG TABS Take 1 tablet by mouth daily.     Continuous Blood Gluc Receiver (FREESTYLE LIBRE 2 READER) DEVI E11.21 Check blood sugar 4 times daily as directed 1 each 0   Continuous Blood Gluc Sensor (FREESTYLE LIBRE 2 SENSOR) MISC E11.21 Change sensor every  14 days as directed 6 each 3   dapagliflozin propanediol (FARXIGA) 10 MG TABS tablet Take 10 mg by mouth daily.     Dulaglutide (TRULICITY) 0.17 BL/3.9QZ SOPN Inject 0.75 mg into the skin once a week. 6 mL 0   EPINEPHrine 0.3 mg/0.3 mL IJ SOAJ injection Inject 0.3 mg into the muscle as needed. 1 each 1   flecainide (TAMBOCOR) 100 MG tablet Take 1 tablet (100 mg total) by mouth 2 (two) times daily. 60 tablet 3   levothyroxine (SYNTHROID) 75 MCG tablet TAKE ONE (1) TABLET ONCE DAILY BEFORE BREAKFAST 90 tablet 1   losartan (COZAAR) 50 MG tablet Take 1 tablet (50 mg total) by mouth daily.     Lutein 20 MG TABS Take 1 tablet by mouth daily.     Melatonin 10 MG TABS Take 10 mg by mouth 4 (four) times a week.     metFORMIN (GLUCOPHAGE) 1000 MG tablet TAKE ONE TABLET TWICE DAILY WITH MEALS 180 tablet 0   Multiple Vitamin (MULTIVITAMIN WITH MINERALS) TABS tablet Take 1 tablet by mouth daily.     omega-3  acid ethyl esters (LOVAZA) 1 g capsule Take 2 capsules by mouth 2 (two) times daily.     omeprazole (PRILOSEC) 20 MG capsule TAKE ONE (1) CAPSULE EACH DAY 90 capsule 0   Probiotic Product (PROBIOTIC & ACIDOPHILUS EX ST PO) Take 1 tablet by mouth daily.      solifenacin (VESICARE) 5 MG tablet Take 1 tablet (5 mg total) by mouth daily. 30 tablet 2   torsemide (DEMADEX) 20 MG tablet Take 1 tablet (20 mg total) by mouth 3 (three) times a week. Please take this on Monday, Wednesday, and Friday. 90 tablet 0   venlafaxine XR (EFFEXOR XR) 150 MG 24 hr capsule Take 1 capsule (150 mg total) by mouth daily with breakfast. 90 capsule 1   venlafaxine XR (EFFEXOR-XR) 75 MG 24 hr capsule TAKE 1 CAPSULE DAILY WITH BREAKFAST FOR 7 DAYS; THEN 2 CAPSULES DAILY WITH BREAKFAST FOR 23 DAYS. 53 capsule 0   Vitamin D, Ergocalciferol, (DRISDOL) 1.25 MG (50000 UNIT) CAPS capsule Take 1 capsule (50,000 Units total) by mouth every 7 (seven) days. 5 capsule 5   XARELTO 20 MG TABS tablet TAKE ONE (1) TABLET ONCE DAILY WITH SUPPER 30  tablet 3   Zinc Sulfate (ZINC 15 PO) Take 150 mg by mouth 3 (three) times a week.     zolpidem (AMBIEN) 10 MG tablet Take 1 tablet (10 mg total) by mouth at bedtime as needed for sleep. 30 tablet 2   cyanocobalamin (,VITAMIN B-12,) 1000 MCG/ML injection INJECT 1ML INTO THE MUSCLE EVERY 30 DAYS 1 mL 2   metoprolol succinate (TOPROL-XL) 50 MG 24 hr tablet Take 1 tablet (50 mg total) by mouth at bedtime.     No current facility-administered medications for this visit.    Allergies:   Patient has no active allergies.   Social History:  The patient  reports that she has never smoked. She has never used smokeless tobacco. She reports current alcohol use of about 3.0 - 4.0 standard drinks per week. She reports that she does not use drugs.   Family History:  The patient's family history includes Atrial fibrillation in her mother; Diabetes in her maternal grandmother; Heart attack in her brother and mother; Heart disease in her brother; Heart failure in her father and paternal grandfather; Stroke in her paternal grandfather.   ROS:  Please see the history of present illness.   Otherwise, review of systems is positive for none.   All other systems are reviewed and negative.   PHYSICAL EXAM: VS:  BP 116/80 (BP Location: Left Arm, Patient Position: Sitting, Cuff Size: Normal)   Pulse 66   Ht 5\' 8"  (1.727 m)   Wt 183 lb (83 kg)   SpO2 96%   BMI 27.83 kg/m  , BMI Body mass index is 27.83 kg/m. GEN: Well nourished, well developed, in no acute distress  HEENT: normal  Neck: no JVD, carotid bruits, or masses Cardiac: RRR; no murmurs, rubs, or gallops,no edema  Respiratory:  clear to auscultation bilaterally, normal work of breathing GI: soft, nontender, nondistended, + BS MS: no deformity or atrophy  Skin: warm and dry Neuro:  Strength and sensation are intact Psych: euthymic mood, full affect  EKG:  EKG is not ordered today. Personal review of the ekg ordered 01/03/21 shows sinus rhythm, rate  68  Recent Labs: 01/31/2021: TSH 3.900 03/10/2021: ALT 18; BUN 14; Creatinine, Ser 0.86; Hemoglobin 13.6; Platelets 309; Potassium 5.3; Sodium 143    Lipid Panel     Component  Value Date/Time   CHOL 187 03/10/2021 0906   TRIG 181 (H) 03/10/2021 0906   HDL 62 03/10/2021 0906   CHOLHDL 3.0 03/10/2021 0906   LDLCALC 94 03/10/2021 0906     Wt Readings from Last 3 Encounters:  04/18/21 183 lb (83 kg)  04/13/21 180 lb (81.6 kg)  03/15/21 184 lb (83.5 kg)      Other studies Reviewed: Additional studies/ records that were reviewed today include: Monitor 01/29/2019 personally reviewed Review of the above records today demonstrates:  1. atrial fibrillation with good heart rate control 2.  Rare ventricular ectopy  TTE 11/08/2020 Ejection fraction 55 to 60% Normal right ventricular size and function Left atrium normal in size Right atrium normal in size Aortic valve trileaflet structurally normal  Normal-appearing mitral valve  TEE 11/26/20  1. Left ventricular ejection fraction, by estimation, is 55 to 60%. The  left ventricle has normal function.   2. Right ventricular systolic function is normal. The right ventricular  size is normal.   3. Left atrial size was mildly dilated. No left atrial/left atrial  appendage thrombus was detected.   4. The mitral valve is grossly normal. Trivial mitral valve  regurgitation.   5. The aortic valve is tricuspid. Aortic valve regurgitation is trivial.   6. Agitated saline contrast bubble study was negative, with no evidence  of any interatrial shunt.   ASSESSMENT AND PLAN:  1.  Persistent atrial fibrillation: Currently on Eliquis and flecainide.  High risk medication monitoring.  Status post ablation 03/21/2019.  CHA2DS2-VASc of 6.  She had a left atrial flutter and is now status post cardioversion May 2022.  It does not appear that she has had any further episodes of atrial fibrillation, though with her weakness and fatigue, we Raye Wiens plan for a  2-week monitor.  If she does have further episodes of atrial fibrillation, repeat ablation may be an option.  She is on twice daily metoprolol.  We Alazar Cherian stop this and start Toprol-XL 50 mg.  2.  Hypertension: Currently well controlled  3.  Typical atrial flutter: Status post ablation 05/29/2020.  No obvious recurrence.   Current medicines are reviewed at length with the patient today.   The patient does not have concerns regarding her medicines.  The following changes were made today: Stop metoprolol, start Toprol-XL  Labs/ tests ordered today include:  Orders Placed This Encounter  Procedures   LONG TERM MONITOR (3-14 DAYS)      Disposition:   FU with Kaliopi Blyden 3 months  Signed, Honey Zakarian Meredith Leeds, MD  04/18/2021 12:59 PM     [facility] [address] [place] [address] [phone] (office) [phone] (fax)

## 2021-04-18 NOTE — Patient Instructions (Signed)
Medication Instructions:  TAKE METOPROLOL 50 MG AT BEDTIME *If you need a refill on your cardiac medications before your next appointment, please call your pharmacy*   Lab Work: NONE If you have labs (blood work) drawn today and your tests are completely normal, you will receive your results only by: [place] (if you have MyChart) OR A paper copy in the mail If you have any lab test that is abnormal or we need to change your treatment, we will call you to review the results.   Testing/Procedures: MONITOR 2 WEEKS   Follow-Up: At [facility], you and your health needs are our priority.  As part of our continuing mission to provide you with exceptional heart care, we have created designated Provider Care Teams.  These Care Teams include your primary Cardiologist (physician) and Advanced Practice Providers (APPs -  Physician Assistants and Nurse Practitioners) who all work together to provide you with the care you need, when you need it.  We recommend signing up for the patient portal called "MyChart".  Sign up information is provided on this After Visit Summary.  MyChart is used to connect with patients for Virtual Visits (Telemedicine).  Patients are able to view lab/test results, encounter notes, upcoming appointments, etc.  Non-urgent messages can be sent to your provider as well.   To learn more about what you can do with MyChart, go to [email or web address].    Your next appointment:   3 month(s)  The format for your next appointment:   In Person  Provider:   DR Curt Bears   Other Instructions

## 2021-04-19 ENCOUNTER — Ambulatory Visit (INDEPENDENT_AMBULATORY_CARE_PROVIDER_SITE_OTHER): Payer: Medicare Other

## 2021-04-19 ENCOUNTER — Encounter: Payer: Self-pay | Admitting: *Deleted

## 2021-04-19 DIAGNOSIS — I4819 Other persistent atrial fibrillation: Secondary | ICD-10-CM

## 2021-04-19 NOTE — Progress Notes (Unsigned)
Patient enrolled for [employer] to mail a 14 day ZIO XT monitor to her address on file. Letter with instructions mailed to patient.

## 2021-04-21 DIAGNOSIS — M1712 Unilateral primary osteoarthritis, left knee: Secondary | ICD-10-CM | POA: Diagnosis not present

## 2021-04-24 DIAGNOSIS — I4819 Other persistent atrial fibrillation: Secondary | ICD-10-CM

## 2021-05-02 ENCOUNTER — Telehealth: Payer: Self-pay

## 2021-05-02 NOTE — Chronic Care Management (AMB) (Signed)
Chronic Care Management Pharmacy Assistant   Name: April Munoz  MRN: [id number] DOB: 1951/02/01   Reason for Encounter: Disease State/ Diabetes   Recent office visits:  01-31-2021 Rip Harbour, NP. INCREASE rexulti from 0.5 mg TO 1 mg daily.  02-14-2021 Rip Harbour, NP. STOP Trintellix. START Effexor 75 mg with breakfast for 7 days THEN 150 mg daily with breakfast for 23 days. Referral placed to psychiatry.  03-08-2021 [name], MD. DECREASE losartan 50 mg twice daily TO 50 mg daily. Glucose= 101, Potassium= 5.3. A1C= 6.1. Abnormal UA.  04-13-2021 [name], MD. START Vesicare 5 mg daily.  Recent consult visits:  01-03-2021 Constance Haw, MD (Cardiology). EKG completed. Continue current medications. Follow up in  3 months.  01-05-2021 Estrella Myrtle, MD (Chiropractor). Electric stimulation therapy.  01-05-2021 Gae Dry, DPM (Podiatry). Diabetic foot exam performed including visual inspection, sensory exam using a monofilament, and pulse exam. STOP Fosamax, Bystolic, Rythmol, Eliquis.  01-11-2021 Misenheimer, Christia Reading, MD Gertie Fey). Unable to view encounter.  01-13-2021 Estrella Myrtle, MD (Chiropractor). Electric stimulation therapy.  01-26-2021 Joya Salm, MD (Orthopedic surgery). Unable to view encounter.  04-18-2021 Constance Haw, MD (Cardiology). CHANGE metoprolol 50 mg twice daily TO 50 mg daily. Orders placed for long term monitor.   Hospital visits:  None in previous 6 months  Medications: Outpatient Encounter Medications as of 05/02/2021  Medication Sig   acyclovir (ZOVIRAX) 800 MG tablet TAKE ONE (1) TABLET FOUR (4) TIMES DAILYAS NEEDED FOR SHINGLES   atorvastatin (LIPITOR) 10 MG tablet TAKE ONE (1) TABLET BY MOUTH ONCE DAILY   B Complex-C-Folic Acid TABS Take 1 tablet by mouth daily.   brexpiprazole (REXULTI) 1 MG TABS tablet Take 1 tablet (1 mg total) by mouth daily.   Calcium Carb-Cholecalciferol (LIQUID  CALCIUM WITH D3) [phone] MG-UNIT CAPS Take 600 mg by mouth daily.   Calcium-Vitamin D-Vitamin K (VIACTIV CALCIUM PLUS D) 650-12.5-40 MG-MCG-MCG CHEW Chew 1 capsule by mouth daily.   Coenzyme Q10 100 MG TABS Take 1 tablet by mouth daily.   Continuous Blood Gluc Receiver (FREESTYLE LIBRE 2 READER) DEVI E11.21 Check blood sugar 4 times daily as directed   Continuous Blood Gluc Sensor (FREESTYLE LIBRE 2 SENSOR) MISC E11.21 Change sensor every 14 days as directed   cyanocobalamin (,VITAMIN B-12,) 1000 MCG/ML injection INJECT 1ML INTO THE MUSCLE EVERY 30 DAYS   dapagliflozin propanediol (FARXIGA) 10 MG TABS tablet Take 10 mg by mouth daily.   Dulaglutide (TRULICITY) 9.29 WK/4.6KM SOPN Inject 0.75 mg into the skin once a week.   EPINEPHrine 0.3 mg/0.3 mL IJ SOAJ injection Inject 0.3 mg into the muscle as needed.   flecainide (TAMBOCOR) 100 MG tablet Take 1 tablet (100 mg total) by mouth 2 (two) times daily.   levothyroxine (SYNTHROID) 75 MCG tablet TAKE ONE (1) TABLET ONCE DAILY BEFORE BREAKFAST   losartan (COZAAR) 50 MG tablet Take 1 tablet (50 mg total) by mouth daily.   Lutein 20 MG TABS Take 1 tablet by mouth daily.   Melatonin 10 MG TABS Take 10 mg by mouth 4 (four) times a week.   metFORMIN (GLUCOPHAGE) 1000 MG tablet TAKE ONE TABLET TWICE DAILY WITH MEALS   metoprolol succinate (TOPROL-XL) 50 MG 24 hr tablet Take 1 tablet (50 mg total) by mouth at bedtime.   Multiple Vitamin (MULTIVITAMIN WITH MINERALS) TABS tablet Take 1 tablet by mouth daily.   omega-3 acid ethyl esters (LOVAZA) 1 g capsule Take 2 capsules by mouth 2 (two) times daily.  omeprazole (PRILOSEC) 20 MG capsule TAKE ONE (1) CAPSULE EACH DAY   Probiotic Product (PROBIOTIC & ACIDOPHILUS EX ST PO) Take 1 tablet by mouth daily.    solifenacin (VESICARE) 5 MG tablet Take 1 tablet (5 mg total) by mouth daily.   torsemide (DEMADEX) 20 MG tablet Take 1 tablet (20 mg total) by mouth 3 (three) times a week. Please take this on Monday,  Wednesday, and Friday.   venlafaxine XR (EFFEXOR XR) 150 MG 24 hr capsule Take 1 capsule (150 mg total) by mouth daily with breakfast.   venlafaxine XR (EFFEXOR-XR) 75 MG 24 hr capsule TAKE 1 CAPSULE DAILY WITH BREAKFAST FOR 7 DAYS; THEN 2 CAPSULES DAILY WITH BREAKFAST FOR 23 DAYS.   Vitamin D, Ergocalciferol, (DRISDOL) 1.25 MG (50000 UNIT) CAPS capsule Take 1 capsule (50,000 Units total) by mouth every 7 (seven) days.   XARELTO 20 MG TABS tablet TAKE ONE (1) TABLET ONCE DAILY WITH SUPPER   Zinc Sulfate (ZINC 15 PO) Take 150 mg by mouth 3 (three) times a week.   zolpidem (AMBIEN) 10 MG tablet Take 1 tablet (10 mg total) by mouth at bedtime as needed for sleep.   No facility-administered encounter medications on file as of 05/02/2021.   Recent Relevant Labs: Lab Results  Component Value Date/Time   HGBA1C 6.1 (H) 03/10/2021 09:06 AM   HGBA1C 7.1 (H) 09/03/2020 09:53 AM   MICROALBUR 80 03/15/2021 10:11 PM   MICROALBUR 150 05/27/2020 09:39 AM    Kidney Function Lab Results  Component Value Date/Time   CREATININE 0.86 03/10/2021 09:06 AM   CREATININE 0.90 11/26/2020 09:26 AM   GFRNONAA 53 (L) 09/03/2020 09:53 AM   GFRAA 61 09/03/2020 09:53 AM     Current antihyperglycemic regimen:  metformin 1000 mg daily  Farxiga 10 mg daily  Trulicity 4.09 mg weekly     Patient verbally confirms she is taking the above medications as directed. Yes  What recent interventions/DTPs have been made to improve glycemic control:  Educated on A1c and blood sugar goals Complications of diabetes including kidney damage, retinal damage, and cardiovascular disease Benefits of weight loss Benefits of routine self-monitoring of blood sugar; Counseled to check feet daily and get yearly eye exams Counseled on diet and exercise extensively Recommended increasing Farxiga 10 mg daily. Begin taking metformin 1000 mg daily. Will consider increase in Trulicity at next visit if blood sugar remains above goal. Patient  follows-up with GI in  June and has a history of stomach issues. Choosing to increase Farxiga first to avoid GI adverse events.   Have there been any recent hospitalizations or ED visits since last visit with CPP? No  Patient denies hypoglycemic symptoms  Patient denies hyperglycemic symptoms  How often are you checking your blood sugar? once daily  What are your blood sugars ranging?  Fasting: None Before meals: None After meals: 155 Bedtime: 130  On insulin? No  During the week, how often does your blood glucose drop below 70? Never  Are you checking your feet daily/regularly? Yes  Adherence Review: Is the patient currently on a STATIN medication? Yes Is the patient currently on ACE/ARB medication? Yes Does the patient have >5 day gap between last estimated fill dates? CPP to review  Care Gaps: Last eye exam / Retinopathy Screening? Patient states about 6 months ago Last Annual Wellness Visit? None Last Diabetic Foot Exam? 01-05-2021 Shingrix overdue Hep C screening overdue Tdap overdue Colonoscopy overdue Covid booster overdue RAF= 2.716% A1C 03-10-2021 6.1  Star  Rating Drugs:  Losartan 50 mg- Last filled 03-14-2021 90 DS Farxiga 10 mg- Patient assistance Trulicity 1.46 mg- Patient assistance Metformin 1000 mg- Last filled 04-29-2021 30 DS Atorvastatin 10 mg- Last filled 02-09-2021 90 DS  [place] Clinical Pharmacist Assistant [phone]

## 2021-05-03 DIAGNOSIS — I4819 Other persistent atrial fibrillation: Secondary | ICD-10-CM | POA: Diagnosis not present

## 2021-05-05 ENCOUNTER — Ambulatory Visit: Payer: Medicare Other | Admitting: Pulmonary Disease

## 2021-05-09 ENCOUNTER — Encounter: Payer: Self-pay | Admitting: Family Medicine

## 2021-05-16 ENCOUNTER — Ambulatory Visit: Payer: Medicare Other | Admitting: Cardiology

## 2021-05-17 ENCOUNTER — Telehealth: Payer: Self-pay | Admitting: Cardiology

## 2021-05-17 ENCOUNTER — Other Ambulatory Visit: Payer: Self-pay

## 2021-05-17 DIAGNOSIS — F330 Major depressive disorder, recurrent, mild: Secondary | ICD-10-CM

## 2021-05-17 DIAGNOSIS — F411 Generalized anxiety disorder: Secondary | ICD-10-CM

## 2021-05-17 MED ORDER — BREXPIPRAZOLE 1 MG PO TABS: 1.0000 mg | ORAL_TABLET | Freq: Every day | ORAL | 1 refills | Status: DC

## 2021-05-17 NOTE — Telephone Encounter (Signed)
Patient states she wore a heart monitor for 4-5 days, but then it started blinking orange. She states she contacted [place] and someone informed her that if we were able to pick up enough information we would not have to order a new one. She is calling for an update on that. She states it has been about 2 weeks and she has not heard back from [place].

## 2021-05-18 NOTE — Telephone Encounter (Signed)
Monitor results reviewed with pt, who verbalized understanding. She will keep monitoring things for now. If she has further afib issues she will reach out to office to further discuss if ablation is needed. She appreciates my follow up call.

## 2021-05-19 ENCOUNTER — Other Ambulatory Visit: Payer: Self-pay | Admitting: Physician Assistant

## 2021-05-24 ENCOUNTER — Telehealth: Payer: Self-pay

## 2021-05-24 NOTE — Chronic Care Management (AMB) (Signed)
Chronic Care Management Pharmacy Assistant   Name: Kirby Argueta  MRN: [id number] DOB: 1950-10-18    Patient Assistance Documentation: 2023 PAP renewal for Rexulti   05/24/21 I have filled out 2023 PAP renewal for pt Rexulti and will be mailed out to pt. I called pt and she has been informed and understood.    Medications: Outpatient Encounter Medications as of 05/24/2021  Medication Sig   acyclovir (ZOVIRAX) 800 MG tablet TAKE ONE (1) TABLET FOUR (4) TIMES DAILYAS NEEDED FOR SHINGLES   atorvastatin (LIPITOR) 10 MG tablet TAKE ONE (1) TABLET BY MOUTH ONCE DAILY   B Complex-C-Folic Acid TABS Take 1 tablet by mouth daily.   brexpiprazole (REXULTI) 1 MG TABS tablet Take 1 tablet (1 mg total) by mouth daily.   Calcium Carb-Cholecalciferol (LIQUID CALCIUM WITH D3) [phone] MG-UNIT CAPS Take 600 mg by mouth daily.   Calcium-Vitamin D-Vitamin K (VIACTIV CALCIUM PLUS D) 650-12.5-40 MG-MCG-MCG CHEW Chew 1 capsule by mouth daily.   Coenzyme Q10 100 MG TABS Take 1 tablet by mouth daily.   Continuous Blood Gluc Receiver (FREESTYLE LIBRE 2 READER) DEVI E11.21 Check blood sugar 4 times daily as directed   Continuous Blood Gluc Sensor (FREESTYLE LIBRE 2 SENSOR) MISC E11.21 Change sensor every 14 days as directed   cyanocobalamin (,VITAMIN B-12,) 1000 MCG/ML injection INJECT 1ML INTO THE MUSCLE EVERY 30 DAYS   dapagliflozin propanediol (FARXIGA) 10 MG TABS tablet Take 10 mg by mouth daily.   Dulaglutide (TRULICITY) 7.51 WC/5.8NI SOPN Inject 0.75 mg into the skin once a week.   EPINEPHrine 0.3 mg/0.3 mL IJ SOAJ injection Inject 0.3 mg into the muscle as needed.   flecainide (TAMBOCOR) 100 MG tablet Take 1 tablet (100 mg total) by mouth 2 (two) times daily.   levothyroxine (SYNTHROID) 75 MCG tablet TAKE ONE (1) TABLET ONCE DAILY BEFORE BREAKFAST   losartan (COZAAR) 50 MG tablet Take 1 tablet (50 mg total) by mouth daily.   Lutein 20 MG TABS Take 1 tablet by mouth daily.   Melatonin 10 MG TABS Take 10  mg by mouth 4 (four) times a week.   metFORMIN (GLUCOPHAGE) 1000 MG tablet TAKE ONE TABLET TWICE DAILY WITH MEALS   metoprolol succinate (TOPROL-XL) 50 MG 24 hr tablet Take 1 tablet (50 mg total) by mouth at bedtime.   Multiple Vitamin (MULTIVITAMIN WITH MINERALS) TABS tablet Take 1 tablet by mouth daily.   omega-3 acid ethyl esters (LOVAZA) 1 g capsule Take 2 capsules by mouth 2 (two) times daily.   omeprazole (PRILOSEC) 20 MG capsule TAKE ONE (1) CAPSULE EACH DAY   Probiotic Product (PROBIOTIC & ACIDOPHILUS EX ST PO) Take 1 tablet by mouth daily.    solifenacin (VESICARE) 5 MG tablet Take 1 tablet (5 mg total) by mouth daily.   torsemide (DEMADEX) 20 MG tablet Take 1 tablet (20 mg total) by mouth 3 (three) times a week. Please take this on Monday, Wednesday, and Friday.   venlafaxine XR (EFFEXOR XR) 150 MG 24 hr capsule Take 1 capsule (150 mg total) by mouth daily with breakfast.   venlafaxine XR (EFFEXOR-XR) 75 MG 24 hr capsule TAKE 1 CAPSULE DAILY WITH BREAKFAST FOR 7 DAYS; THEN 2 CAPSULES DAILY WITH BREAKFAST FOR 23 DAYS.   Vitamin D, Ergocalciferol, (DRISDOL) 1.25 MG (50000 UNIT) CAPS capsule Take 1 capsule (50,000 Units total) by mouth every 7 (seven) days.   XARELTO 20 MG TABS tablet TAKE ONE (1) TABLET ONCE DAILY WITH SUPPER   Zinc Sulfate (ZINC 15  PO) Take 150 mg by mouth 3 (three) times a week.   zolpidem (AMBIEN) 10 MG tablet Take 1 tablet (10 mg total) by mouth at bedtime as needed for sleep.   No facility-administered encounter medications on file as of 05/24/2021.     Elray Mcgregor, [place] Pharmacist Assistant  [phone]

## 2021-05-25 ENCOUNTER — Other Ambulatory Visit: Payer: Self-pay | Admitting: Cardiology

## 2021-05-26 ENCOUNTER — Telehealth: Payer: Self-pay

## 2021-05-26 ENCOUNTER — Other Ambulatory Visit: Payer: Self-pay

## 2021-05-26 DIAGNOSIS — G4700 Insomnia, unspecified: Secondary | ICD-10-CM

## 2021-05-26 NOTE — Telephone Encounter (Signed)
-----   Message from [name], MD sent at 05/25/2021  5:21 PM EST ----- Regarding: FW: Medication Side Effects Recommend remain of ambien.  Recommended pt be referred to Dr Brett Fairy for sleep evaluation. kc ----- Message ----- From: Eulis Canner Sent: 05/24/2021   9:24 AM EST To: Oleta Mouse, CMA, [name], MD Subject: Medication Side Effects                        I spoke with pt and she wanted to inform you that she has stopped her Ambien due to black out episodes. She stated she blacked out and her husband had to go looking for her. She has stopped it about 3 weeks ago. Please call pt and advise.  Elray Mcgregor, [place] Pharmacist Assistant  [phone]

## 2021-05-26 NOTE — Telephone Encounter (Signed)
Keily was notified that she should remain off the [place] and Dr. Tobie Poet did recommend a referral to Dr. Brett Fairy.  Valla did agree to the referral.

## 2021-06-15 ENCOUNTER — Ambulatory Visit (INDEPENDENT_AMBULATORY_CARE_PROVIDER_SITE_OTHER): Payer: Medicare Other | Admitting: Family Medicine

## 2021-06-15 ENCOUNTER — Other Ambulatory Visit: Payer: Self-pay

## 2021-06-15 VITALS — BP 132/82 | HR 74 | Resp 16 | Ht 68.0 in | Wt 171.0 lb

## 2021-06-15 DIAGNOSIS — D6869 Other thrombophilia: Secondary | ICD-10-CM | POA: Diagnosis not present

## 2021-06-15 DIAGNOSIS — I4811 Longstanding persistent atrial fibrillation: Secondary | ICD-10-CM | POA: Diagnosis not present

## 2021-06-15 DIAGNOSIS — I110 Hypertensive heart disease with heart failure: Secondary | ICD-10-CM

## 2021-06-15 DIAGNOSIS — F331 Major depressive disorder, recurrent, moderate: Secondary | ICD-10-CM

## 2021-06-15 DIAGNOSIS — E782 Mixed hyperlipidemia: Secondary | ICD-10-CM | POA: Diagnosis not present

## 2021-06-15 DIAGNOSIS — S0083XA Contusion of other part of head, initial encounter: Secondary | ICD-10-CM | POA: Diagnosis not present

## 2021-06-15 DIAGNOSIS — E038 Other specified hypothyroidism: Secondary | ICD-10-CM

## 2021-06-15 DIAGNOSIS — R2689 Other abnormalities of gait and mobility: Secondary | ICD-10-CM | POA: Diagnosis not present

## 2021-06-15 DIAGNOSIS — E559 Vitamin D deficiency, unspecified: Secondary | ICD-10-CM

## 2021-06-15 DIAGNOSIS — E1121 Type 2 diabetes mellitus with diabetic nephropathy: Secondary | ICD-10-CM | POA: Diagnosis not present

## 2021-06-15 DIAGNOSIS — R296 Repeated falls: Secondary | ICD-10-CM | POA: Diagnosis not present

## 2021-06-15 DIAGNOSIS — E039 Hypothyroidism, unspecified: Secondary | ICD-10-CM

## 2021-06-15 DIAGNOSIS — E538 Deficiency of other specified B group vitamins: Secondary | ICD-10-CM

## 2021-06-15 MED ORDER — OLANZAPINE 2.5 MG PO TABS: 2.5000 mg | ORAL_TABLET | Freq: Every day | ORAL | 2 refills | Status: DC

## 2021-06-15 NOTE — Patient Instructions (Addendum)
Add zyprexa 2.5 mg once daily. In place of rexulti.  Further physical therapy for poor balance and frequent falls.

## 2021-06-15 NOTE — Progress Notes (Signed)
Subjective:  Patient ID: April Munoz, female    DOB: July 10, 1951  Age: 70 y.o. MRN: [id number]  Chief Complaint  Patient presents with   Diabetes   Hyperlipidemia   Hypertension    HPI Pt fell 5 weeks ago. She fell forward. She is unsure why she fell. She bruised both her legs. Somehow she rolled and lightly hit gravel with head. Denies LOC. She has a scab on the back of her head which is healing. She did not seek medical attention at the time or since. She has had some headaches in the front and back which has been intermittent and began about 2-3 weeks ago. Not severe.   Diabetes: Patient is currently taking Farxiga 10 mg daily, Trulicity 0.75mg   once a week, Metformin 1000 mg twice a day. Sugars 70-230. Using CGM Free style [place]. Low sugars occasionally in 60s. Checks feet daily. Gets annual eye exams. Pt I sfollowing a low fat and low carb diet, but feels she needs to eat smaller portions.   Hyperlipidemia: She takes lovaza 2 gr BID, atorvastatin 10 mg daily, Coenzyme Q10 daily.  Hypertension:  Currently taking Losartan 50 mg daily.  Afib: Flecainide 100mg  daily, Xarelto 20 mg daily, metoprolol 50 mg daily.  Depression: Effexor 150 mg daily.I added rexulti which was helping, but was very expensive. Would like alternate.  PHQ9 SCORE ONLY 06/19/2021 03/08/2021 02/14/2021  PHQ-9 Total Score 15 7 20     Hypothryroidism: Synthroid 75 mcg daily before breakfast.  Current Outpatient Medications on File Prior to Visit  Medication Sig Dispense Refill   B Complex-C-Folic Acid TABS Take 1 tablet by mouth daily.     Calcium Carb-Cholecalciferol (LIQUID CALCIUM WITH D3) [phone] MG-UNIT CAPS Take 600 mg by mouth daily.     Calcium-Vitamin D-Vitamin K (VIACTIV CALCIUM PLUS D) 650-12.5-40 MG-MCG-MCG CHEW Chew 1 capsule by mouth daily.     Coenzyme Q10 100 MG TABS Take 1 tablet by mouth daily.     Continuous Blood Gluc Receiver (FREESTYLE LIBRE 2 READER) DEVI E11.21 Check blood sugar 4 times daily  as directed 1 each 0   Continuous Blood Gluc Sensor (FREESTYLE LIBRE 2 SENSOR) MISC E11.21 Change sensor every 14 days as directed 6 each 3   cyanocobalamin (,VITAMIN B-12,) 1000 MCG/ML injection INJECT 1ML INTO THE MUSCLE EVERY 30 DAYS 1 mL 2   dapagliflozin propanediol (FARXIGA) 10 MG TABS tablet Take 10 mg by mouth daily.     Dulaglutide (TRULICITY) 3.57 SV/7.7LT SOPN Inject 0.75 mg into the skin once a week. 6 mL 0   EPINEPHrine 0.3 mg/0.3 mL IJ SOAJ injection Inject 0.3 mg into the muscle as needed. 1 each 1   flecainide (TAMBOCOR) 100 MG tablet TAKE ONE (1) TABLET BY MOUTH TWO (2) TIMES DAILY 180 tablet 3   levothyroxine (SYNTHROID) 75 MCG tablet TAKE ONE (1) TABLET ONCE DAILY BEFORE BREAKFAST 90 tablet 1   losartan (COZAAR) 50 MG tablet Take 1 tablet (50 mg total) by mouth daily.     Lutein 20 MG TABS Take 1 tablet by mouth daily.     Melatonin 10 MG TABS Take 10 mg by mouth 4 (four) times a week.     metFORMIN (GLUCOPHAGE) 1000 MG tablet TAKE ONE TABLET TWICE DAILY WITH MEALS 180 tablet 0   metoprolol succinate (TOPROL-XL) 50 MG 24 hr tablet Take 1 tablet (50 mg total) by mouth at bedtime.     Multiple Vitamin (MULTIVITAMIN WITH MINERALS) TABS tablet Take 1 tablet by mouth daily.  omega-3 acid ethyl esters (LOVAZA) 1 g capsule Take 2 capsules by mouth 2 (two) times daily.     omeprazole (PRILOSEC) 20 MG capsule TAKE ONE (1) CAPSULE EACH DAY 90 capsule 0   Probiotic Product (PROBIOTIC & ACIDOPHILUS EX ST PO) Take 1 tablet by mouth daily.      solifenacin (VESICARE) 5 MG tablet Take 1 tablet (5 mg total) by mouth daily. 30 tablet 2   torsemide (DEMADEX) 20 MG tablet Take 1 tablet (20 mg total) by mouth 3 (three) times a week. Please take this on Monday, Wednesday, and Friday. 90 tablet 0   venlafaxine XR (EFFEXOR XR) 150 MG 24 hr capsule Take 1 capsule (150 mg total) by mouth daily with breakfast. 90 capsule 1   venlafaxine XR (EFFEXOR-XR) 75 MG 24 hr capsule TAKE 1 CAPSULE DAILY WITH  BREAKFAST FOR 7 DAYS; THEN 2 CAPSULES DAILY WITH BREAKFAST FOR 23 DAYS. 53 capsule 0   Vitamin D, Ergocalciferol, (DRISDOL) 1.25 MG (50000 UNIT) CAPS capsule Take 1 capsule (50,000 Units total) by mouth every 7 (seven) days. 5 capsule 5   XARELTO 20 MG TABS tablet TAKE ONE (1) TABLET ONCE DAILY WITH SUPPER 30 tablet 3   Zinc Sulfate (ZINC 15 PO) Take 150 mg by mouth 3 (three) times a week.     No current facility-administered medications on file prior to visit.   Past Medical History:  Diagnosis Date   Anxiety    Arthritis    Ataxic gait    Atrial fibrillation (HCC)    Atrophy of thyroid    B12 deficiency 10/31/2017   Cellulitis of abdominal wall 07/07/2016   Chronic anticoagulation 05/31/2018   Depression    Depression, major, recurrent, mild ([place]) 09/16/2019   Diabetes mellitus without complication ([place])    Diabetic glomerulopathy ([place]) 09/16/2019   Esophageal stricture 06/05/2017   Essential hypertension 02/24/2013   Fatigue 01/20/2020   Fibromyalgia 02/24/2013   Full dentures    Hereditary and idiopathic peripheral neuropathy 02/24/2013   High risk medication use 11/07/2017   Hyperlipidemia 02/24/2013   Hypertension    Hypertensive heart disease 02/24/2013   Incontinence    Insomnia    Longstanding persistent atrial fibrillation ([place]) 09/16/2019   Mild vitamin D deficiency 09/16/2019   Mixed hyperlipidemia 02/24/2013   Murmur, cardiac 08/15/2015   Neuropathy in diabetes ([place]) 10/31/2017   Osteoporosis    Other amnesia    Other amnesia    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    Paroxysmal atrial fibrillation ([place]) 08/15/2015   CHADS2vasc=3 CHADS2vasc=3   Primary insomnia    Restless legs syndrome 03/14/2013   Secondary hypothyroidism 09/16/2019   Thoracic or lumbosacral neuritis or radiculitis 02/24/2013   Type 2 diabetes mellitus, without long-term current use of insulin ([place]) 02/24/2013   Urge incontinence of urine 09/16/2019   Wears glasses    Past Surgical  History:  Procedure Laterality Date   A-FLUTTER ABLATION N/A 07/30/2019   Procedure: A-FLUTTER ABLATION;  Surgeon: Constance Haw, MD;  Location: [place] [facility];  Service: Cardiovascular;  Laterality: N/A;   ATRIAL FIBRILLATION ABLATION N/A 03/21/2019   Procedure: ATRIAL FIBRILLATION ABLATION;  Surgeon: Constance Haw, MD;  Location: [place] [facility];  Service: Cardiovascular;  Laterality: N/A;   [place] STUDY  11/26/2020   Procedure: BUBBLE STUDY;  Surgeon: Pixie Casino, MD;  Location: [facility];  Service: Cardiovascular;;   CARDIOVERSION N/A 11/26/2020   Procedure:  CARDIOVERSION;  Surgeon: Pixie Casino, MD;  Location: [facility] ENDOSCOPY;  Service: Cardiovascular;  Laterality: N/A;   CATARACT EXTRACTION, BILATERAL  2018, 2019   CHOLECYSTECTOMY  1971   open   DIAGNOSTIC LAPAROSCOPY  2010   lysis of adhesions   DILATION AND CURETTAGE OF UTERUS  2004   KNEE ARTHROSCOPY  1998   left   KNEE ARTHROSCOPY W/ LATERAL RETINACULAR REPAIR     MASS EXCISION Left 11/11/2013   Procedure: EXCISION MUCOID CYST LEFT INDEX FINGER/DEBRIDEMENT LEFT INDEX FINGER;  Surgeon: Wynonia Sours, MD;  Location: [facility];  Service: Orthopedics;  Laterality: Left;  ANESTHESIA: IV REGIONAL/FAB   SHOULDER ARTHROSCOPY W/ ROTATOR CUFF REPAIR  2007   left   TEE WITHOUT CARDIOVERSION N/A 11/26/2020   Procedure: TRANSESOPHAGEAL ECHOCARDIOGRAM (TEE);  Surgeon: Pixie Casino, MD;  [name];  Service: Cardiovascular;  Laterality: N/A;   TRIGGER FINGER RELEASE Left 11/11/2013   Procedure: RELEASE A-1 PULLEY LEFT RING FINGER;  Surgeon: Wynonia Sours, MD;  Location: [place];  Service: Orthopedics;  Laterality: Left;   UMBILICAL HERNIA REPAIR  2008, 2010    Family History  Problem Relation Age of Onset   Heart failure Father    Heart disease Brother    Heart attack Brother    Heart failure Paternal Grandfather    Stroke Paternal  Grandfather    Atrial fibrillation Mother    Heart attack Mother    Diabetes Maternal Grandmother    Social History   Socioeconomic History   Marital status: Married    Spouse name: Not on file   Number of children: Not on file   Years of education: Not on file   Highest education level: Not on file  Occupational History   Occupation: retired  Tobacco Use   Smoking status: Never   Smokeless tobacco: Never  Vaping Use   Vaping Use: Never used  Substance and Sexual Activity   Alcohol use: Yes    Alcohol/week: 3.0 - 4.0 standard drinks    Types: 1 - 2 Glasses of wine, 2 Shots of liquor per week    Comment: 4 times a week   Drug use: No   Sexual activity: Not on file  Other Topics Concern   Not on file  Social History Narrative   Not on file   Social Determinants of Health   Financial Resource Strain: Not on file  Food Insecurity: No Food Insecurity   Worried About Running Out of Food in the Last Year: Never true   Ran Out of Food in the Last Year: Never true  Transportation Needs: No Transportation Needs   Lack of Transportation (Medical): No   Lack of Transportation (Non-Medical): No  Physical Activity: Not on file  Stress: Not on file  Social Connections: Not on file    Review of Systems  Constitutional:  Positive for fatigue (always). Negative for chills and fever.  HENT:  Negative for congestion, ear pain and sore throat.   Eyes:  Positive for visual disturbance (chronic. seeing eye doctor.).  Respiratory:  Negative for cough and shortness of breath.   Cardiovascular:  Negative for chest pain.  Gastrointestinal:  Negative for abdominal pain, constipation, diarrhea, nausea and vomiting.  Genitourinary:  Negative for dysuria and urgency.  Musculoskeletal:  Negative for arthralgias and myalgias.  Skin:  Negative for rash.  Neurological:  Positive for headaches. Negative for dizziness.  Psychiatric/Behavioral:  Negative for dysphoric mood. The patient is not  nervous/anxious.     Objective:  BP 132/82   Pulse 74   Resp 16   Ht 5\' 8"  (1.727 m)   Wt 171 lb (77.6 kg)   SpO2 99%   BMI 26.00 kg/m   BP/Weight 06/15/2021 04/18/2021 2/69/4854  Systolic BP 627 035 009  Diastolic BP 82 80 68  Wt. (Lbs) 171 183 180  BMI 26 27.83 27.37    Physical Exam Vitals reviewed.  Constitutional:      Appearance: Normal appearance.  HENT:     Right Ear: Tympanic membrane normal.     Left Ear: Tympanic membrane normal.     Nose: Nose normal.     Mouth/Throat:     Pharynx: No oropharyngeal exudate or posterior oropharyngeal erythema.  Neck:     Vascular: No carotid bruit.  Cardiovascular:     Rate and Rhythm: Normal rate. Rhythm irregular.     Pulses: Normal pulses.     Heart sounds: Normal heart sounds. No murmur heard. Pulmonary:     Effort: Pulmonary effort is normal.     Breath sounds: Normal breath sounds.  Abdominal:     General: Bowel sounds are normal.     Palpations: Abdomen is soft.     Tenderness: There is no abdominal tenderness.  Lymphadenopathy:     Cervical: No cervical adenopathy.  Skin:    Findings: Bruising (Bilateral knees and shins) present. No rash.     Comments: 2 inch scab posterior left scalp.  Neurological:     Mental Status: She is alert and oriented to person, place, and time.  Psychiatric:        Behavior: Behavior normal.     Comments: Depressed.    Diabetic Foot Exam - Simple   Simple Foot Form  06/15/2021  8:47 PM  Visual Inspection No deformities, no ulcerations, no other skin breakdown bilaterally: Yes Sensation Testing Intact to touch and monofilament testing bilaterally: Yes Pulse Check Posterior Tibialis and Dorsalis pulse intact bilaterally: Yes Comments      Lab Results  Component Value Date   WBC 8.7 06/15/2021   HGB 15.4 06/15/2021   HCT 47.2 (H) 06/15/2021   PLT 412 06/15/2021   GLUCOSE 107 (H) 06/15/2021   CHOL 219 (H) 06/15/2021   TRIG 168 (H) 06/15/2021   HDL 75 06/15/2021    LDLCALC 115 (H) 06/15/2021   ALT 24 06/15/2021   AST 26 06/15/2021   NA 145 (H) 06/15/2021   K 4.4 06/15/2021   CL 103 06/15/2021   CREATININE 1.06 (H) 06/15/2021   BUN 15 06/15/2021   CO2 22 06/15/2021   TSH 4.170 06/15/2021   HGBA1C 6.1 (H) 06/15/2021   MICROALBUR 80 03/15/2021      Assessment & Plan:   Problem List Items Addressed This Visit       Cardiovascular and Mediastinum   Hypertensive heart disease    Well controlled.  No changes to medicines.  Continue to work on eating a healthy diet and exercise.  Labs drawn today.        Relevant Orders   CBC with Differential/Platelet (Completed)   Comprehensive metabolic panel (Completed)   Atrial fibrillation ([place]) - Primary    The current medical regimen is effective;  continue present plan and medications.         Endocrine   Acquired hypothyroidism    The current medical regimen is effective;  continue present plan and medications.       Relevant Orders   TSH (  Completed)   Diabetic glomerulopathy ([place])    Control: controlled. Recommend check sugars fasting daily. Recommend check feet daily. Recommend annual eye exams. Medicines: Farxiga 10 mg daily, Trulicity 0.17 mg, metformin 1000 mg. Continue to work on eating a healthy diet and exercise.  Labs drawn today.        Relevant Orders   Hemoglobin A1c (Completed)   POCT UA - Microalbumin     Hematopoietic and Hemostatic   Acquired thrombophilia ([place])    Must to be seen by ED if she falls and hits her head, due to increase risk of bleeding due to xarelto.        Other   B12 deficiency    Continue b12 monthly      Major depressive disorder, recurrent episode, moderate (HCC)    Add zyprexa 2.5 mg once daily. In place of rexulti.      Vitamin D deficiency disease    The current medical regimen is effective;  continue present plan and medications. Continue vitamin d 50 K once weekly.      Head contusion    Refer to physical therapy for  poor balance and frequent falls.       Frequent falls    Refer to PT      Relevant Orders   Ambulatory referral to Physical Therapy   Imbalance    Refer to PT      Relevant Orders   TSH (Completed)   Ambulatory referral to Physical Therapy   Mixed hyperlipidemia    Well controlled.  No changes to medicines.  Continue to work on eating a healthy diet and exercise.  Labs drawn today.        Relevant Orders   Lipid panel (Completed)  .  Meds ordered this encounter  Medications   OLANZapine (ZYPREXA) 2.5 MG tablet    Sig: Take 1 tablet (2.5 mg total) by mouth at bedtime.    Dispense:  30 tablet    Refill:  2    Orders Placed This Encounter  Procedures   CBC with Differential/Platelet   Comprehensive metabolic panel   Hemoglobin A1c   Lipid panel   TSH   Ambulatory referral to Physical Therapy   POCT UA - Microalbumin   Follow-up: Return in about 3 months (around 09/13/2021) for chronic fasting.  An After Visit Summary was printed and given to the patient.  [name], MD [facility] [phone]

## 2021-06-16 LAB — COMPREHENSIVE METABOLIC PANEL
ALT: 24 IU/L (ref 0–32)
AST: 26 IU/L (ref 0–40)
Albumin/Globulin Ratio: 2 (ref 1.2–2.2)
Albumin: 4.4 g/dL (ref 3.7–4.7)
Alkaline Phosphatase: 100 IU/L (ref 44–121)
BUN/Creatinine Ratio: 14 (ref 12–28)
BUN: 15 mg/dL (ref 8–27)
Bilirubin Total: 0.5 mg/dL (ref 0.0–1.2)
CO2: 22 mmol/L (ref 20–29)
Calcium: 10 mg/dL (ref 8.7–10.3)
Chloride: 103 mmol/L (ref 96–106)
Creatinine, Ser: 1.06 mg/dL — ABNORMAL HIGH (ref 0.57–1.00)
Globulin, Total: 2.2 g/dL (ref 1.5–4.5)
Glucose: 107 mg/dL — ABNORMAL HIGH (ref 70–99)
Potassium: 4.4 mmol/L (ref 3.5–5.2)
Sodium: 145 mmol/L — ABNORMAL HIGH (ref 134–144)
Total Protein: 6.6 g/dL (ref 6.0–8.5)
eGFR: 56 mL/min/{1.73_m2} — ABNORMAL LOW (ref >59)

## 2021-06-16 LAB — CBC WITH DIFFERENTIAL/PLATELET
Basophils Absolute: 0.1 10*3/uL (ref 0.0–0.2)
Basos: 1 %
EOS (ABSOLUTE): 0.1 10*3/uL (ref 0.0–0.4)
Eos: 1 %
Hematocrit: 47.2 % — ABNORMAL HIGH (ref 34.0–46.6)
Hemoglobin: 15.4 g/dL (ref 11.1–15.9)
Immature Grans (Abs): 0 10*3/uL (ref 0.0–0.1)
Immature Granulocytes: 0 %
Lymphocytes Absolute: 2.6 10*3/uL (ref 0.7–3.1)
Lymphs: 29 %
MCH: 30.7 pg (ref 26.6–33.0)
MCHC: 32.6 g/dL (ref 31.5–35.7)
MCV: 94 fL (ref 79–97)
Monocytes Absolute: 0.9 10*3/uL (ref 0.1–0.9)
Monocytes: 10 %
Neutrophils Absolute: 5.1 10*3/uL (ref 1.4–7.0)
Neutrophils: 59 %
Platelets: 412 10*3/uL (ref 150–450)
RBC: 5.01 x10E6/uL (ref 3.77–5.28)
RDW: 12.5 % (ref 11.7–15.4)
WBC: 8.7 10*3/uL (ref 3.4–10.8)

## 2021-06-16 LAB — HEMOGLOBIN A1C
Est. average glucose Bld gHb Est-mCnc: 128 mg/dL
Hgb A1c MFr Bld: 6.1 % — ABNORMAL HIGH (ref 4.8–5.6)

## 2021-06-16 LAB — LIPID PANEL
Chol/HDL Ratio: 2.9 ratio (ref 0.0–4.4)
Cholesterol, Total: 219 mg/dL — ABNORMAL HIGH (ref 100–199)
HDL: 75 mg/dL (ref >39)
LDL Chol Calc (NIH): 115 mg/dL — ABNORMAL HIGH (ref 0–99)
Triglycerides: 168 mg/dL — ABNORMAL HIGH (ref 0–149)
VLDL Cholesterol Cal: 29 mg/dL (ref 5–40)

## 2021-06-16 LAB — TSH: TSH: 4.17 u[IU]/mL (ref 0.450–4.500)

## 2021-06-16 NOTE — Progress Notes (Signed)
Blood count normal.  Liver function normal.  Kidney function normal.  Thyroid function normal.  Cholesterol: LDL still high at 115. Recommend increase lipitor to 20 mg once daily at night. HBA1C: stable.

## 2021-06-17 ENCOUNTER — Other Ambulatory Visit: Payer: Self-pay

## 2021-06-17 ENCOUNTER — Other Ambulatory Visit: Payer: Self-pay | Admitting: Family Medicine

## 2021-06-17 MED ORDER — ATORVASTATIN CALCIUM 20 MG PO TABS: 20.0000 mg | ORAL_TABLET | Freq: Every day | ORAL | 2 refills | Status: DC

## 2021-06-19 DIAGNOSIS — S0093XA Contusion of unspecified part of head, initial encounter: Secondary | ICD-10-CM | POA: Insufficient documentation

## 2021-06-19 NOTE — Assessment & Plan Note (Signed)
Well controlled.  ?No changes to medicines.  ?Continue to work on eating a healthy diet and exercise.  ?Labs drawn today.  ?

## 2021-06-19 NOTE — Assessment & Plan Note (Signed)
The current medical regimen is effective;  continue present plan and medications.  

## 2021-06-19 NOTE — Assessment & Plan Note (Addendum)
Control: controlled. Recommend check sugars fasting daily. Recommend check feet daily. Recommend annual eye exams. Medicines: Farxiga 10 mg daily, Trulicity 3.83 mg, metformin 1000 mg. Continue to work on eating a healthy diet and exercise.  Labs drawn today.

## 2021-06-19 NOTE — Assessment & Plan Note (Signed)
Add zyprexa 2.5 mg once daily. In place of rexulti.

## 2021-06-19 NOTE — Assessment & Plan Note (Deleted)
The current medical regimen is effective;  continue present plan and medications.  

## 2021-06-19 NOTE — Assessment & Plan Note (Addendum)
Refer to physical therapy for poor balance and frequent falls.

## 2021-06-20 ENCOUNTER — Telehealth: Payer: Self-pay

## 2021-06-20 NOTE — Progress Notes (Signed)
Chronic Care Management Pharmacy Assistant   Name: Simrat Kendrick  MRN: [id number] DOB: [birth year]  Reason for Encounter: Patient Assistance Documentation  I have filled out 2023 renewal PAP for Trulicity and will be mailed out to pt to finish filling out.     Medications: Outpatient Encounter Medications as of 06/20/2021  Medication Sig   acyclovir (ZOVIRAX) 800 MG tablet Take 1 tablet (800 mg total) by mouth 4 (four) times daily.   atorvastatin (LIPITOR) 20 MG tablet Take 1 tablet (20 mg total) by mouth daily.   B Complex-C-Folic Acid TABS Take 1 tablet by mouth daily.   Calcium Carb-Cholecalciferol (LIQUID CALCIUM WITH D3) [phone] MG-UNIT CAPS Take 600 mg by mouth daily.   Calcium-Vitamin D-Vitamin K (VIACTIV CALCIUM PLUS D) 650-12.5-40 MG-MCG-MCG CHEW Chew 1 capsule by mouth daily.   Coenzyme Q10 100 MG TABS Take 1 tablet by mouth daily.   Continuous Blood Gluc Receiver (FREESTYLE LIBRE 2 READER) DEVI E11.21 Check blood sugar 4 times daily as directed   Continuous Blood Gluc Sensor (FREESTYLE LIBRE 2 SENSOR) MISC E11.21 Change sensor every 14 days as directed   cyanocobalamin (,VITAMIN B-12,) 1000 MCG/ML injection INJECT 1ML INTO THE MUSCLE EVERY 30 DAYS   dapagliflozin propanediol (FARXIGA) 10 MG TABS tablet Take 10 mg by mouth daily.   Dulaglutide (TRULICITY) 2.44 WN/0.2VO SOPN Inject 0.75 mg into the skin once a week.   EPINEPHrine 0.3 mg/0.3 mL IJ SOAJ injection Inject 0.3 mg into the muscle as needed.   flecainide (TAMBOCOR) 100 MG tablet TAKE ONE (1) TABLET BY MOUTH TWO (2) TIMES DAILY   levothyroxine (SYNTHROID) 75 MCG tablet TAKE ONE (1) TABLET ONCE DAILY BEFORE BREAKFAST   losartan (COZAAR) 50 MG tablet Take 1 tablet (50 mg total) by mouth daily.   Lutein 20 MG TABS Take 1 tablet by mouth daily.   Melatonin 10 MG TABS Take 10 mg by mouth 4 (four) times a week.   metFORMIN (GLUCOPHAGE) 1000 MG tablet TAKE ONE TABLET TWICE DAILY WITH MEALS   metoprolol succinate (TOPROL-XL)  50 MG 24 hr tablet Take 1 tablet (50 mg total) by mouth at bedtime.   Multiple Vitamin (MULTIVITAMIN WITH MINERALS) TABS tablet Take 1 tablet by mouth daily.   OLANZapine (ZYPREXA) 2.5 MG tablet Take 1 tablet (2.5 mg total) by mouth at bedtime.   omega-3 acid ethyl esters (LOVAZA) 1 g capsule Take 2 capsules by mouth 2 (two) times daily.   omeprazole (PRILOSEC) 20 MG capsule TAKE ONE (1) CAPSULE EACH DAY   Probiotic Product (PROBIOTIC & ACIDOPHILUS EX ST PO) Take 1 tablet by mouth daily.    solifenacin (VESICARE) 5 MG tablet Take 1 tablet (5 mg total) by mouth daily.   torsemide (DEMADEX) 20 MG tablet Take 1 tablet (20 mg total) by mouth 3 (three) times a week. Please take this on Monday, Wednesday, and Friday.   venlafaxine XR (EFFEXOR XR) 150 MG 24 hr capsule Take 1 capsule (150 mg total) by mouth daily with breakfast.   venlafaxine XR (EFFEXOR-XR) 75 MG 24 hr capsule TAKE 1 CAPSULE DAILY WITH BREAKFAST FOR 7 DAYS; THEN 2 CAPSULES DAILY WITH BREAKFAST FOR 23 DAYS.   Vitamin D, Ergocalciferol, (DRISDOL) 1.25 MG (50000 UNIT) CAPS capsule Take 1 capsule (50,000 Units total) by mouth every 7 (seven) days.   XARELTO 20 MG TABS tablet TAKE ONE (1) TABLET ONCE DAILY WITH SUPPER   Zinc Sulfate (ZINC 15 PO) Take 150 mg by mouth 3 (three) times a week.  No facility-administered encounter medications on file as of 06/20/2021.    Elray Mcgregor, [place] Pharmacist Assistant  [phone]

## 2021-06-23 DIAGNOSIS — M6281 Muscle weakness (generalized): Secondary | ICD-10-CM | POA: Diagnosis not present

## 2021-06-23 DIAGNOSIS — R296 Repeated falls: Secondary | ICD-10-CM | POA: Diagnosis not present

## 2021-06-23 DIAGNOSIS — R2689 Other abnormalities of gait and mobility: Secondary | ICD-10-CM | POA: Diagnosis not present

## 2021-06-25 ENCOUNTER — Encounter: Payer: Self-pay | Admitting: Family Medicine

## 2021-06-25 DIAGNOSIS — E039 Hypothyroidism, unspecified: Secondary | ICD-10-CM | POA: Insufficient documentation

## 2021-06-25 DIAGNOSIS — R296 Repeated falls: Secondary | ICD-10-CM | POA: Insufficient documentation

## 2021-06-25 DIAGNOSIS — D6869 Other thrombophilia: Secondary | ICD-10-CM | POA: Insufficient documentation

## 2021-06-25 DIAGNOSIS — R2689 Other abnormalities of gait and mobility: Secondary | ICD-10-CM | POA: Insufficient documentation

## 2021-06-25 NOTE — Assessment & Plan Note (Signed)
Refer to PT

## 2021-06-25 NOTE — Assessment & Plan Note (Signed)
Continue b12 monthly

## 2021-06-25 NOTE — Assessment & Plan Note (Signed)
The current medical regimen is effective;  continue present plan and medications.  

## 2021-06-25 NOTE — Assessment & Plan Note (Signed)
The current medical regimen is effective;  continue present plan and medications. Continue vitamin d 50 K once weekly.

## 2021-06-25 NOTE — Assessment & Plan Note (Signed)
Must to be seen by ED if she falls and hits her head, due to increase risk of bleeding due to xarelto.

## 2021-06-27 DIAGNOSIS — R296 Repeated falls: Secondary | ICD-10-CM | POA: Diagnosis not present

## 2021-06-27 DIAGNOSIS — R2689 Other abnormalities of gait and mobility: Secondary | ICD-10-CM | POA: Diagnosis not present

## 2021-06-27 DIAGNOSIS — M6281 Muscle weakness (generalized): Secondary | ICD-10-CM | POA: Diagnosis not present

## 2021-06-29 DIAGNOSIS — I129 Hypertensive chronic kidney disease with stage 1 through stage 4 chronic kidney disease, or unspecified chronic kidney disease: Secondary | ICD-10-CM | POA: Diagnosis not present

## 2021-06-29 DIAGNOSIS — N182 Chronic kidney disease, stage 2 (mild): Secondary | ICD-10-CM | POA: Diagnosis not present

## 2021-06-29 DIAGNOSIS — D509 Iron deficiency anemia, unspecified: Secondary | ICD-10-CM | POA: Diagnosis not present

## 2021-06-29 DIAGNOSIS — E1122 Type 2 diabetes mellitus with diabetic chronic kidney disease: Secondary | ICD-10-CM | POA: Diagnosis not present

## 2021-06-29 DIAGNOSIS — E559 Vitamin D deficiency, unspecified: Secondary | ICD-10-CM | POA: Diagnosis not present

## 2021-06-30 ENCOUNTER — Telehealth: Payer: Self-pay | Admitting: Cardiology

## 2021-06-30 NOTE — Telephone Encounter (Signed)
Patient called and mentioned that the medication Xarelto is getting too expensive for her to get. Want to know if there was a different alternative for Xarelto or receive some type of help

## 2021-06-30 NOTE — Telephone Encounter (Signed)
Gave patient information to start application process.  Verbalized understanding and that she would call back if it didn't look like she would meet the requirements.   Patient Assistance Programs  If you cannot afford one of the medications listed on the next page, please contact the patient assistance program at the phone number listed beside the medication you need assistance with.  Please ask any questions you have concerning their medication assistance program, and your eligibility for their assistance program.  In most cases, if you are approved you will receive your medication free of charge for the remainder of the year that you are applying in.  If it seems likely you would be approved, please ask them to mail an application to your home address (not faxed to the office).  You may also obtain an application by visiting the patient assistance program at the website listed beside the medication you need assistance with.  Once you receive the application, please complete your part of the application, obtain any documents required by the assistance program you are applying with (please see instruction page of the application for required documents), bring all paperwork to your Cardiology Providers office for drop off and will we take care of the provider page of your application and fax to the appropriate patient assistance program you are applying with.  If you have any questions for [place] along the way, please reach out to [place] and we will assist you to the best of our ability.  We do ask that you allow [place] an adequate amount of time to get your application ready to be faxed to the [place] you are applying with.  You may call the Foundation you applied with to check the progress of your application at any time.  If you decide you do not want to apply for patient assistance, or if you are denied for patient assistance and cannot afford your medication, please contact your cardiologists  office so we can discuss switching you to a medication that is more affordable for you.  Taking your medications as prescribed/directed by your Cardiologist for your heart condition is extremely important for your heart health.  April ReasonsWynetta Munoz and [name] [employer] [phone] or online at Patient Assistance Program Application ([email or web address]). To print an application (Requirements and instructions will be included with the application). and Engineer, maintenance at [phone] or online at [employer] for [employer] ([email or web address]) for more information.

## 2021-07-01 ENCOUNTER — Telehealth: Payer: Self-pay

## 2021-07-01 NOTE — Chronic Care Management (AMB) (Signed)
° ° °  Called April Munoz, No answer, left message of appointment on 07-04-2021 at 1:00 via telephone visit with [place] Constable Pharm D. Notified to have all medications, supplements, blood pressure and/or blood sugar logs available during appointment and to return call if need to reschedule.   Care Gaps: Hep C screening overdue Tdap overdue Shingrix overdue Colonoscopy overdue Covid booster overdue Flu vaccine overdue Yearly foot exam overdue  Star Rating Drug: Losartan 50 mg- Last filled 03-14-2021 90 DS Farxiga 10 mg- Patient assistance Trulicity 2.06 mg- Patient assistance Metformin 1000 mg- Last filled 06-17-2021 90 DS Atorvastatin 10 mg- Last filled 02-09-2021 90 DS  Any gaps in medications fill history? Yes  [place] [facility] Pharmacist Assistant [phone]

## 2021-07-04 ENCOUNTER — Other Ambulatory Visit: Payer: Self-pay

## 2021-07-04 ENCOUNTER — Ambulatory Visit (INDEPENDENT_AMBULATORY_CARE_PROVIDER_SITE_OTHER): Payer: Medicare Other

## 2021-07-04 DIAGNOSIS — I4811 Longstanding persistent atrial fibrillation: Secondary | ICD-10-CM

## 2021-07-04 DIAGNOSIS — E782 Mixed hyperlipidemia: Secondary | ICD-10-CM

## 2021-07-04 DIAGNOSIS — R296 Repeated falls: Secondary | ICD-10-CM | POA: Diagnosis not present

## 2021-07-04 DIAGNOSIS — I110 Hypertensive heart disease with heart failure: Secondary | ICD-10-CM

## 2021-07-04 DIAGNOSIS — E1121 Type 2 diabetes mellitus with diabetic nephropathy: Secondary | ICD-10-CM

## 2021-07-04 DIAGNOSIS — R2689 Other abnormalities of gait and mobility: Secondary | ICD-10-CM | POA: Diagnosis not present

## 2021-07-04 DIAGNOSIS — E039 Hypothyroidism, unspecified: Secondary | ICD-10-CM

## 2021-07-04 DIAGNOSIS — M6281 Muscle weakness (generalized): Secondary | ICD-10-CM | POA: Diagnosis not present

## 2021-07-04 NOTE — Progress Notes (Signed)
Chronic Care Management Pharmacy Note  07/04/2021 Name:  April Munoz MRN:  [id number] DOB:  Jun 17, 1951   Plan Recommendations:  Unable to discuss medications at all today. Patient spent majority of visit introducing herself. Will f/u next month  Subjective: April Munoz is an 70 y.o. year old female who is a primary patient of Cox, Kirsten, MD.  The CCM team was consulted for assistance with disease management and care coordination needs.    Engaged with patient face to face for follow up visit in response to provider referral for pharmacy case management and/or care coordination services.   Consent to Services:  The patient was given the following information about Chronic Care Management services today, agreed to services, and gave verbal consent: 1. CCM service includes personalized support from designated clinical staff supervised by the primary care provider, including individualized plan of care and coordination with other care providers 2. 24/7 contact phone numbers for assistance for urgent and routine care needs. 3. Service will only be billed when office clinical staff spend 20 minutes or more in a month to coordinate care. 4. Only one practitioner may furnish and bill the service in a calendar month. 5.The patient may stop CCM services at any time (effective at the end of the month) by phone call to the office staff. 6. The patient will be responsible for cost sharing (co-pay) of up to 20% of the service fee (after annual deductible is met). Patient agreed to services and consent obtained.  Patient Care Team: [name], MD as PCP - General (Family Medicine) Richardo Priest, MD as PCP - Cardiology (Cardiology) Constance Haw, MD as PCP - Electrophysiology (Cardiology) Richardo Priest, MD as Consulting Physician (Cardiology) Misenheimer, Christia Reading, MD as Consulting Physician (Unknown Physician Specialty) Pollyann Samples, MD as Consulting Physician (General Surgery) Burnice Logan, [facility] (Inactive) as Pharmacist (Pharmacist) Lane Hacker, [facility] as Pharmacist (Pharmacist)  Recent office visits: 11/02/2020 - ordered ekg, echo, carotid ultrasound and labs.  10/01/2020 - add Farxiga 5 mg daily. Continue with Trulicity. Follow-up with psychiatry.  05/27/2020 - increase zolpidem 10 mg once daily. Work on [employer] and exercise. Lab results: Sugar elevated. TSH abnormal - increase Synthroid 88 mcg daily in am. Continue vitamin D supplement. LDL elevated and TG elevated. LFT and Kidney function normal. No evidence of B12 deficiency.   Recent consult visits: 11/15/2020 - cardio - cha2ds2-vasc 6. Start flecanide 100 mg.  11/05/2020 - stop Eliquis and start Xarelto 20 mg.   08/05/2020 - Cardio - continue beta-blocker and anticoagulation. Order CT scan and PFTs.  06/02/2020 - GI - continue PPI. Fiber daily and miralax prn for constipation.  05/26/2020 - pulmonology - labs ordered. Schedule CT in 3 months.  04/26/2020 - Cardio - take diuretic 3 days a week. Refer to pulmonary if atypical pulmonary infection. Continue anticoagulant. Resume statin since previous TIA and hyperlipidemia.  03/09/2020 - nephrology - manage blood pressure. Low sodium diet. Continue ACE/ARB.    Hospital visits:   Objective:  Lab Results  Component Value Date   CREATININE 1.06 (H) 06/15/2021   BUN 15 06/15/2021   GFRNONAA 53 (L) 09/03/2020   GFRAA 61 09/03/2020   NA 145 (H) 06/15/2021   K 4.4 06/15/2021   CALCIUM 10.0 06/15/2021   CO2 22 06/15/2021    Lab Results  Component Value Date/Time   HGBA1C 6.1 (H) 06/15/2021 10:19 AM   HGBA1C 6.1 (H) 03/10/2021 09:06 AM   MICROALBUR 80 03/15/2021 10:11 PM  MICROALBUR 150 05/27/2020 09:39 AM    Last diabetic Eye exam:  Lab Results  Component Value Date/Time   HMDIABEYEEXA No Retinopathy 10/14/2020 12:00 AM    Last diabetic Foot exam: No results found for: HMDIABFOOTEX   Lab Results  Component Value Date   CHOL 219 (H)  06/15/2021   HDL 75 06/15/2021   LDLCALC 115 (H) 06/15/2021   TRIG 168 (H) 06/15/2021   CHOLHDL 2.9 06/15/2021    Hepatic Function Latest Ref Rng & Units 06/15/2021 03/10/2021 09/14/2020  Total Protein 6.0 - 8.5 g/dL 6.6 6.4 6.0  Albumin 3.7 - 4.7 g/dL 4.4 4.3 3.8  AST 0 - 40 IU/L '26 17 24  ' ALT 0 - 32 IU/L 24 18 42(H)  Alk Phosphatase 44 - 121 IU/L 100 74 115  Total Bilirubin 0.0 - 1.2 mg/dL 0.5 0.5 0.4    Lab Results  Component Value Date/Time   TSH 4.170 06/15/2021 10:19 AM   TSH 3.900 01/31/2021 10:17 AM    CBC Latest Ref Rng & Units 06/15/2021 03/10/2021 11/26/2020  WBC 3.4 - 10.8 x10E3/uL 8.7 6.9 -  Hemoglobin 11.1 - 15.9 g/dL 15.4 13.6 12.9  Hematocrit 34.0 - 46.6 % 47.2(H) 40.8 38.0  Platelets 150 - 450 x10E3/uL 412 309 -    Lab Results  Component Value Date/Time   VD25OH 49.8 09/03/2020 09:53 AM   VD25OH 34.4 05/31/2020 09:18 AM    Clinical ASCVD: No  The 10-year ASCVD risk score (Arnett DK, et al., 2019) is: 29%   Values used to calculate the score:     Age: 6 years     Sex: Female     Is Non-Hispanic African American: No     Diabetic: Yes     Tobacco smoker: No     Systolic Blood Pressure: 774 mmHg     Is BP treated: Yes     HDL Cholesterol: 75 mg/dL     Total Cholesterol: 219 mg/dL    Depression screen [facility] 2/9 06/19/2021 03/08/2021 02/14/2021  Decreased Interest '1 1 3  ' Down, Depressed, Hopeless '2 1 3  ' PHQ - 2 Score '3 2 6  ' Altered sleeping '3 3 3  ' Tired, decreased energy '2 1 2  ' Change in appetite 1 0 3  Feeling bad or failure about yourself  2 0 0  Trouble concentrating 2 0 3  Moving slowly or fidgety/restless '2 1 3  ' Suicidal thoughts 0 0 0  PHQ-9 Score '15 7 20  ' Difficult doing work/chores Somewhat difficult Somewhat difficult Not difficult at all  Some recent data might be hidden      Social History   Tobacco Use  Smoking Status Never  Smokeless Tobacco Never   BP Readings from Last 3 Encounters:  06/19/21 132/82  04/18/21 116/80  04/13/21  124/68   Pulse Readings from Last 3 Encounters:  06/19/21 74  04/18/21 66  04/13/21 80   Wt Readings from Last 3 Encounters:  06/19/21 171 lb (77.6 kg)  04/18/21 183 lb (83 kg)  04/13/21 180 lb (81.6 kg)    Assessment/Interventions: Review of patient past medical history, allergies, medications, health status, including review of consultants reports, laboratory and other test data, was performed as part of comprehensive evaluation and provision of chronic care management services.   SDOH:  (Social Determinants of Health) assessments and interventions performed: Yes SDOH Interventions    Flowsheet Row Most Recent Value  SDOH Interventions   Financial Strain Interventions Other (Comment)  [PAP's completed]  Transportation Interventions Intervention  Not Indicated       CCM Care Plan  No Active Allergies   Medications Reviewed Today     Reviewed by [name], MD (Physician) on 06/25/21 at 2131  Med List Status: <None>   Medication Order Taking? Sig Documenting Provider Last Dose Status Informant  acyclovir (ZOVIRAX) 800 MG tablet [id number]  Take 1 tablet (800 mg total) by mouth 4 (four) times daily. Cox, Kirsten, MD  Active   atorvastatin (LIPITOR) 20 MG tablet [id number]  Take 1 tablet (20 mg total) by mouth daily. [name], MD  Active   B Complex-C-Folic Acid TABS [id number] No Take 1 tablet by mouth daily. [provider] Taking Active Self  Calcium Carb-Cholecalciferol (LIQUID CALCIUM WITH D3) [phone] MG-UNIT CAPS [id number] No Take 600 mg by mouth daily. [provider] Taking Active Self  Calcium-Vitamin D-Vitamin K (VIACTIV CALCIUM PLUS D) 650-12.5-40 MG-MCG-MCG CHEW [id number] No Chew 1 capsule by mouth daily. [provider] Taking Active   Coenzyme Q10 100 MG TABS [id number] No Take 1 tablet by mouth daily. [provider] Taking Active Self  Continuous Blood Gluc Receiver (FREESTYLE LIBRE 2 READER) DEVI [id number] No E11.21 Check  blood sugar 4 times daily as directed Cox, Kirsten, MD Taking Active Self  Continuous Blood Gluc Sensor (FREESTYLE LIBRE 2 SENSOR) [place] [id number] No E11.21 Change sensor every 14 days as directed Cox, Elnita Maxwell, MD Taking Active Self  cyanocobalamin (,VITAMIN B-12,) 1000 MCG/ML injection [id number]  INJECT 1ML INTO THE MUSCLE EVERY 30 DAYS Cox, Kirsten, MD  Active   dapagliflozin propanediol (FARXIGA) 10 MG TABS tablet [id number] No Take 10 mg by mouth daily. [provider] Taking Active   Dulaglutide (TRULICITY) 4.97 WY/6.3ZC SOPN [id number] No Inject 0.75 mg into the skin once a week. Cox, Kirsten, MD Taking Active Self  EPINEPHrine 0.3 mg/0.3 mL IJ SOAJ injection [id number] No Inject 0.3 mg into the muscle as needed. [name], MD Taking Active            Med Note Alric Ran Apr 18, 2021 12:02 PM)    flecainide (TAMBOCOR) 100 MG tablet [id number]  TAKE ONE (1) TABLET BY MOUTH TWO (2) TIMES DAILY Camnitz, Will Hassell Done, MD  Active   levothyroxine (SYNTHROID) 75 MCG tablet [id number] No TAKE ONE (1) TABLET ONCE DAILY BEFORE BREAKFAST Cox, Kirsten, MD Taking Active   losartan (COZAAR) 50 MG tablet [id number] No Take 1 tablet (50 mg total) by mouth daily. Cox, Kirsten, MD Taking Active   Lutein 20 MG TABS [id number] No Take 1 tablet by mouth daily. [provider] Taking Active   Melatonin 10 MG TABS [id number] No Take 10 mg by mouth 4 (four) times a week. [provider] Taking Active   metFORMIN (GLUCOPHAGE) 1000 MG tablet [id number] No TAKE ONE TABLET TWICE DAILY WITH MEALS Cox, Kirsten, MD Taking Active   metoprolol succinate (TOPROL-XL) 50 MG 24 hr tablet [id number]  Take 1 tablet (50 mg total) by mouth at bedtime. Camnitz, Will Hassell Done, MD  Active   Multiple Vitamin (MULTIVITAMIN WITH MINERALS) TABS tablet [id number] No Take 1 tablet by mouth daily. [provider] Taking Active Self  OLANZapine (ZYPREXA) 2.5 MG tablet [id number] Yes Take 1 tablet (2.5 mg total)  by mouth at bedtime. Cox, Kirsten, MD  Active   omega-3 acid ethyl esters (LOVAZA) 1 g capsule [id number] No Take 2 capsules by mouth 2 (two) times daily. [provider] Taking Active Self  omeprazole (PRILOSEC) 20 MG capsule [id number]  No TAKE ONE (1) CAPSULE EACH DAY Marge Duncans, PA-C Taking Active   Probiotic Product (PROBIOTIC & ACIDOPHILUS EX ST PO) [id number] No Take 1 tablet by mouth daily.  [provider] Taking Active Self           Med Note Stevan Born   Tue May 27, 2019  3:15 PM)    solifenacin (VESICARE) 5 MG tablet [id number] No Take 1 tablet (5 mg total) by mouth daily. Cox, Kirsten, MD Taking Active   torsemide (DEMADEX) 20 MG tablet [id number] No Take 1 tablet (20 mg total) by mouth 3 (three) times a week. Please take this on Monday, Wednesday, and Friday. Richardo Priest, MD Taking Active   venlafaxine XR (EFFEXOR XR) 150 MG 24 hr capsule [id number] No Take 1 capsule (150 mg total) by mouth daily with breakfast. Cox, Kirsten, MD Taking Active   venlafaxine XR (EFFEXOR-XR) 75 MG 24 hr capsule [id number] No TAKE 1 CAPSULE DAILY WITH BREAKFAST FOR 7 DAYS; THEN 2 CAPSULES DAILY WITH BREAKFAST FOR 23 DAYS. Rip Harbour, NP Taking Active   Vitamin D, Ergocalciferol, (DRISDOL) 1.25 MG (50000 UNIT) CAPS capsule [id number] No Take 1 capsule (50,000 Units total) by mouth every 7 (seven) days. Marge Duncans, PA-C Taking Active   XARELTO 20 MG TABS tablet [id number] No TAKE ONE (1) TABLET ONCE DAILY WITH SUPPER Camnitz, Ocie Doyne, MD Taking Active   Zinc Sulfate (ZINC 15 PO) [id number] No Take 150 mg by mouth 3 (three) times a week. [provider] Taking Active Self            Patient Active Problem List   Diagnosis Date Noted   Frequent falls 06/25/2021   Imbalance 06/25/2021   Acquired hypothyroidism 06/25/2021   Acquired thrombophilia ([place]) 06/25/2021   Head contusion 06/19/2021   Urinary frequency 04/13/2021   Atypical atrial flutter (HCC)    Atrial  fibrillation (HCC)    Anxiety    Arthritis    Ataxic gait    Full dentures    Incontinence    Insomnia    Other amnesia    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    Primary insomnia    Wears glasses    Diabetic glomerulopathy ([place]) 09/16/2019   Urge incontinence 09/16/2019   Secondary hypothyroidism 09/16/2019   Longstanding persistent atrial fibrillation ([place]) 09/16/2019   Major depressive disorder, recurrent episode, moderate ([place]) 09/16/2019   Vitamin D deficiency disease 09/16/2019   Chronic anticoagulation 05/31/2018   High risk medication use 11/07/2017   B12 deficiency 10/31/2017   Neuropathy in diabetes ([place]) 10/31/2017   Esophageal stricture 06/05/2017   Murmur, cardiac 08/15/2015   Restless legs syndrome 03/14/2013   Hypertensive heart disease 02/24/2013   Fibromyalgia 02/24/2013   Hereditary and idiopathic peripheral neuropathy 02/24/2013   Mixed hyperlipidemia 02/24/2013   Osteoporosis 02/24/2013   Thoracic or lumbosacral neuritis or radiculitis 02/24/2013   Hyperlipidemia 02/24/2013    Immunization History  Administered Date(s) Administered   Fluad Quad(high Dose 65+) 03/18/2019, 04/25/2020   Influenza-Unspecified 03/18/2019, 05/14/2020   Moderna Sars-Covid-2 Vaccination 09/09/2019, 10/04/2019, 04/19/2020   Pneumococcal Conjugate-13 05/14/2015   Pneumococcal Polysaccharide-23 05/16/2013, 07/27/2017    Conditions to be addressed/monitored:  Hypertension, Hyperlipidemia, Diabetes, Hypothyroidism, Depression, Anxiety, Osteoporosis and Insomnia.   Care Plan : [place]  Updates made by Lane Hacker, [place] [facility] since 07/04/2021 12:00 AM     Problem: afib, diabetes, hypertension, hyperlipidemia   Priority: High  Onset Date: 08/30/2020  Goal: Disease Management   Start Date: 08/30/2020  Expected End Date: 08/30/2021  Recent Progress: On track  Priority: High  Note:   Current Barriers:  Unable to independently  afford treatment regimen Unable to maintain control of diabetes  Pharmacist Clinical Goal(s):  Over the next 90 days, patient will verbalize ability to afford treatment regimen achieve control of diabetes as evidenced by a1c through collaboration with PharmD and provider.   Interventions: 1:1 collaboration with Cox, Kirsten, MD regarding development and update of comprehensive plan of care as evidenced by provider attestation and co-signature Inter-disciplinary care team collaboration (see longitudinal plan of care) Comprehensive medication review performed; medication list updated in electronic medical record  Hypertension (BP goal <130/80) -controlled -Current treatment: Losartan 50 mg daily Metoprolol XL 80m QD Torsemide 20 mg three times weekly  -Medications previously tried: none reported  -Current home readings: ~140/80  -Current dietary habits: reports eating during the night some.  -Current exercise habits: none reported  -Denies hypotensive/hypertensive symptoms -Educated on BP goals and benefits of medications for prevention of heart attack, stroke and kidney damage; Daily salt intake goal < 2300 mg; Exercise goal of 150 minutes per week; Importance of home blood pressure monitoring; Proper BP monitoring technique; -Counseled to monitor BP at home daily, document, and provide log at future appointments -Counseled on diet and exercise extensively December 2022: Unable to talk about meds at this visit, patient re-scheduled to next month  Hyperlipidemia: (LDL goal < 55) The 10-year ASCVD risk score (Arnett DK, et al., 2019) is: 29%   Values used to calculate the score:     Age: [age]years     Sex: Female     Is Non-Hispanic African American: No     Diabetic: Yes     Tobacco smoker: No     Systolic Blood Pressure: 1706mmHg     Is BP treated: Yes     HDL Cholesterol: 75 mg/dL     Total Cholesterol: 219 mg/dL Lab Results  Component Value Date   CHOL 219 (H) 06/15/2021    CHOL 187 03/10/2021   CHOL 156 09/03/2020   Lab Results  Component Value Date   HDL 75 06/15/2021   HDL 62 03/10/2021   HDL 57 09/03/2020   Lab Results  Component Value Date   LDLCALC 115 (H) 06/15/2021   LDLCALC 94 03/10/2021   LDLCALC 73 09/03/2020   Lab Results  Component Value Date   TRIG 168 (H) 06/15/2021   TRIG 181 (H) 03/10/2021   TRIG 153 (H) 09/03/2020   Lab Results  Component Value Date   CHOLHDL 2.9 06/15/2021   CHOLHDL 3.0 03/10/2021   CHOLHDL 2.7 09/03/2020  No results found for: LDLDIRECT -not ideally controlled -Current treatment: Atorvastatin 10 mg daily  -Medications previously tried: pravastatin, Lovaza -Current dietary patterns: reports eating at night. Loves eggs.  -Current exercise habits: none reported -Educated on Cholesterol goals;  Benefits of statin for ASCVD risk reduction; Importance of limiting foods high in cholesterol; Exercise goal of 150 minutes per week; -Counseled on diet and exercise extensively Recommended to continue current medication December 2022: Unable to talk about meds at this visit, patient re-scheduled to next month  Diabetes (A1c goal <7%) Lab Results  Component Value Date   HGBA1C 6.1 (H) 06/15/2021   HGBA1C 6.1 (H) 03/10/2021   HGBA1C 7.1 (H) 09/03/2020   Lab Results  Component Value Date   MICROALBUR 80 03/15/2021   LDLCALC 115 (H) 06/15/2021   CREATININE 1.06 (  H) 06/15/2021   Lab Results  Component Value Date   NA 145 (H) 06/15/2021   K 4.4 06/15/2021   CREATININE 1.06 (H) 06/15/2021   EGFR 56 (L) 06/15/2021   GFRNONAA 53 (L) 09/03/2020   GLUCOSE 107 (H) 06/15/2021   Lab Results  Component Value Date   WBC 8.7 06/15/2021   HGB 15.4 06/15/2021   HCT 47.2 (H) 06/15/2021   MCV 94 06/15/2021   PLT 412 06/15/2021  -Controlled -Current medications: metformin 1000 mg daily  Farxiga 10 mg daily  Trulicity 5.85 mg weekly   -Medications previously tried: none reported  -Current home glucose  readings fasting glucose: ~130 mg/dL post prandial glucose: not reported -Denies hypoglycemic/hyperglycemic symptoms -Current meal patterns:  Patient reports eating during the night.  Not eating well since cardioversion.  -Current exercise: none reported -Educated onA1c and blood sugar goals; Complications of diabetes including kidney damage, retinal damage, and cardiovascular disease; Benefits of weight loss; Benefits of routine self-monitoring of blood sugar; -Counseled to check feet daily and get yearly eye exams -Counseled on diet and exercise extensively December 2022: Unable to talk about meds at this visit, patient re-scheduled to next month  Atrial Fibrillation (Goal: prevent stroke and major bleeding) -controlled  -CHADSVASC: 6 -Current treatment: Rate control:  metoprolol xl 88m QD flecanide 100 mg bid Anticoagulation:  Xarelto 20 mg daily  -Medications previously tried: Eliquis  -Home BP and HR readings: none reported  -Counseled on increased risk of stroke due to Afib and benefits of anticoagulation for stroke prevention; importance of adherence to anticoagulant exactly as prescribed; avoidance of NSAIDs due to increased bleeding risk with anticoagulants; -Counseled on diet and exercise extensively Recommended to continue current medication December 2022: Unable to talk about meds at this visit, patient re-scheduled to next month  Osteoporosis / Osteopenia (Goal  Minimize risk of break or fracture) -controlled -Last DEXA Scan: not available in chart   -Current treatment  calcium/vitamin D/vitamin k chew daily  Vitamin D 50,000 units weekly on Monday  -Medications previously tried: none reported  -Recommend [phone] units of vitamin D daily. Recommend 1200 mg of calcium daily from dietary and supplemental sources. Recommend weight-bearing and muscle strengthening exercises for building and maintaining bone density. -Counseled on diet and exercise  extensively December 2022: Unable to talk about meds at this visit, patient re-scheduled to next month  Depression (Goal: manage symptoms of depression) -not ideally controlled -Current treatment  Venlafaxine XR 1554mQD Olanzapine 2.69m74mS Solifenacin 69mg70m -Medications previously tried: Trintellix, Rexulti, states she has tried other medications in the past but has been on venlafaxine of and on for 30 years, venlafaxine December 2022: Unable to talk about meds at this visit, patient re-scheduled to next month  Insomnia (Goal: improve sleep) -controlled -Current treatment  Zolpidem 10 mg at bedtime prn sleep  -Medications previously tried: melatonin, mirtazapine -Counseled on good sleep hygeine.     Patient Goals/Self-Care Activities Over the next 90 days, patient will:  - take medications as prescribed focus on medication adherence by using pill box check glucose daily , document, and provide at future appointments check blood pressure weekly, document, and provide at future appointments  Follow Up Plan: Telephone follow up appointment with care management team member scheduled for: 07/2021  NathArizona Constablearm.D. - [phone]        Medication Assistance: Application for Trulicity and Farxiga  medication assistance program. in process.  Anticipated assistance start date 12/29/2020.  See plan of care for additional detail.  Patient currently approved for Trintellix through 07/16/2021. Application for Rexulti in process.   Patient's preferred pharmacy is:  [facility], [place] [address] Phone: [phone] Fax: [place], [place] [address] [place] [address] Phone: [phone] Fax: [place] [facility], [place] [address] [place] [address] Phone: [phone] Fax: [phone]   Uses pill box? Yes Pt endorses good compliance  We discussed: Benefits of medication synchronization, packaging and delivery as well as enhanced pharmacist oversight with [employer]. Patient decided to: Continue current medication management strategy  Care Plan and Follow Up Patient Decision:  Patient agrees to Care Plan and Follow-up.  Plan: Telephone follow up appointment with care management team member scheduled for:  Jan 2023  [place] Constable, [place].D. - [phone]

## 2021-07-04 NOTE — Patient Instructions (Signed)
Visit Information   Goals Addressed   None    Patient Care Plan: CCM Pharmacy Care Plan     Problem Identified: afib, diabetes, hypertension, hyperlipidemia   Priority: High  Onset Date: 08/30/2020     Goal: Disease Management   Start Date: 08/30/2020  Expected End Date: 08/30/2021  Recent Progress: On track  Priority: High  Note:   Current Barriers:  Unable to independently afford treatment regimen Unable to maintain control of diabetes  Pharmacist Clinical Goal(s):  Over the next 90 days, patient will verbalize ability to afford treatment regimen achieve control of diabetes as evidenced by a1c through collaboration with PharmD and provider.   Interventions: 1:1 collaboration with Cox, Kirsten, MD regarding development and update of comprehensive plan of care as evidenced by provider attestation and co-signature Inter-disciplinary care team collaboration (see longitudinal plan of care) Comprehensive medication review performed; medication list updated in electronic medical record  Hypertension (BP goal <130/80) -controlled -Current treatment: Losartan 50 mg daily Metoprolol XL 60m QD Torsemide 20 mg three times weekly  -Medications previously tried: none reported  -Current home readings: ~140/80  -Current dietary habits: reports eating during the night some.  -Current exercise habits: none reported  -Denies hypotensive/hypertensive symptoms -Educated on BP goals and benefits of medications for prevention of heart attack, stroke and kidney damage; Daily salt intake goal < 2300 mg; Exercise goal of 150 minutes per week; Importance of home blood pressure monitoring; Proper BP monitoring technique; -Counseled to monitor BP at home daily, document, and provide log at future appointments -Counseled on diet and exercise extensively December 2022: Unable to talk about meds at this visit, patient re-scheduled to next month  Hyperlipidemia: (LDL goal < 55) The 10-year ASCVD  risk score (Arnett DK, et al., 2019) is: 29%   Values used to calculate the score:     Age: 89years     Sex: Female     Is Non-Hispanic African American: No     Diabetic: Yes     Tobacco smoker: No     Systolic Blood Pressure: 1607mmHg     Is BP treated: Yes     HDL Cholesterol: 75 mg/dL     Total Cholesterol: 219 mg/dL Lab Results  Component Value Date   CHOL 219 (H) 06/15/2021   CHOL 187 03/10/2021   CHOL 156 09/03/2020   Lab Results  Component Value Date   HDL 75 06/15/2021   HDL 62 03/10/2021   HDL 57 09/03/2020   Lab Results  Component Value Date   LDLCALC 115 (H) 06/15/2021   LDLCALC 94 03/10/2021   LDLCALC 73 09/03/2020   Lab Results  Component Value Date   TRIG 168 (H) 06/15/2021   TRIG 181 (H) 03/10/2021   TRIG 153 (H) 09/03/2020   Lab Results  Component Value Date   CHOLHDL 2.9 06/15/2021   CHOLHDL 3.0 03/10/2021   CHOLHDL 2.7 09/03/2020  No results found for: LDLDIRECT -not ideally controlled -Current treatment: Atorvastatin 10 mg daily  -Medications previously tried: pravastatin, Lovaza -Current dietary patterns: reports eating at night. Loves eggs.  -Current exercise habits: none reported -Educated on Cholesterol goals;  Benefits of statin for ASCVD risk reduction; Importance of limiting foods high in cholesterol; Exercise goal of 150 minutes per week; -Counseled on diet and exercise extensively Recommended to continue current medication December 2022: Unable to talk about meds at this visit, patient re-scheduled to next month  Diabetes (A1c goal <7%) Lab Results  Component Value Date  HGBA1C 6.1 (H) 06/15/2021   HGBA1C 6.1 (H) 03/10/2021   HGBA1C 7.1 (H) 09/03/2020   Lab Results  Component Value Date   MICROALBUR 80 03/15/2021   LDLCALC 115 (H) 06/15/2021   CREATININE 1.06 (H) 06/15/2021   Lab Results  Component Value Date   NA 145 (H) 06/15/2021   K 4.4 06/15/2021   CREATININE 1.06 (H) 06/15/2021   EGFR 56 (L) 06/15/2021    GFRNONAA 53 (L) 09/03/2020   GLUCOSE 107 (H) 06/15/2021   Lab Results  Component Value Date   WBC 8.7 06/15/2021   HGB 15.4 06/15/2021   HCT 47.2 (H) 06/15/2021   MCV 94 06/15/2021   PLT 412 06/15/2021  -Controlled -Current medications: metformin 1000 mg daily  Farxiga 10 mg daily  Trulicity 5.63 mg weekly   -Medications previously tried: none reported  -Current home glucose readings fasting glucose: ~130 mg/dL post prandial glucose: not reported -Denies hypoglycemic/hyperglycemic symptoms -Current meal patterns:  Patient reports eating during the night.  Not eating well since cardioversion.  -Current exercise: none reported -Educated onA1c and blood sugar goals; Complications of diabetes including kidney damage, retinal damage, and cardiovascular disease; Benefits of weight loss; Benefits of routine self-monitoring of blood sugar; -Counseled to check feet daily and get yearly eye exams -Counseled on diet and exercise extensively December 2022: Unable to talk about meds at this visit, patient re-scheduled to next month  Atrial Fibrillation (Goal: prevent stroke and major bleeding) -controlled  -CHADSVASC: 6 -Current treatment: Rate control:  metoprolol xl 28m QD flecanide 100 mg bid Anticoagulation:  Xarelto 20 mg daily  -Medications previously tried: Eliquis  -Home BP and HR readings: none reported  -Counseled on increased risk of stroke due to Afib and benefits of anticoagulation for stroke prevention; importance of adherence to anticoagulant exactly as prescribed; avoidance of NSAIDs due to increased bleeding risk with anticoagulants; -Counseled on diet and exercise extensively Recommended to continue current medication December 2022: Unable to talk about meds at this visit, patient re-scheduled to next month  Osteoporosis / Osteopenia (Goal  Minimize risk of break or fracture) -controlled -Last DEXA Scan: not available in chart   -Current treatment   calcium/vitamin D/vitamin k chew daily  Vitamin D 50,000 units weekly on Monday  -Medications previously tried: none reported  -Recommend [phone] units of vitamin D daily. Recommend 1200 mg of calcium daily from dietary and supplemental sources. Recommend weight-bearing and muscle strengthening exercises for building and maintaining bone density. -Counseled on diet and exercise extensively December 2022: Unable to talk about meds at this visit, patient re-scheduled to next month  Depression (Goal: manage symptoms of depression) -not ideally controlled -Current treatment  Venlafaxine XR 1598mQD Olanzapine 2.77m77mS Solifenacin 77mg51m -Medications previously tried: Trintellix, Rexulti, states she has tried other medications in the past but has been on venlafaxine of and on for 30 years, venlafaxine December 2022: Unable to talk about meds at this visit, patient re-scheduled to next month  Insomnia (Goal: improve sleep) -controlled -Current treatment  Zolpidem 10 mg at bedtime prn sleep  -Medications previously tried: melatonin, mirtazapine -Counseled on good sleep hygeine.     Patient Goals/Self-Care Activities Over the next 90 days, patient will:  - take medications as prescribed focus on medication adherence by using pill box check glucose daily , document, and provide at future appointments check blood pressure weekly, document, and provide at future appointments  Follow Up Plan: Telephone follow up appointment with care management team member scheduled for: 07/2021  [place] Constable, Pharm.D. - [phone]       The patient verbalized understanding of instructions, educational materials, and care plan provided today and declined offer to receive copy of patient instructions, educational materials, and care plan.  The pharmacy team will reach out to the patient again over the next 90 days.   Lane Hacker, [facility]

## 2021-07-06 ENCOUNTER — Other Ambulatory Visit: Payer: Self-pay | Admitting: Family Medicine

## 2021-07-08 IMAGING — US US RENAL
1 series · 14 of 25 positions shown · non-contrast
Comparison: Prior CT from 08/01/2018.

CLINICAL DATA: Initial evaluation for chronic kidney disease, stage
II.

EXAM:
RENAL / URINARY TRACT ULTRASOUND COMPLETE

[Series 1: us renal · 0.23mm/px · 14 of 39 slices shown]
[im 1/39]
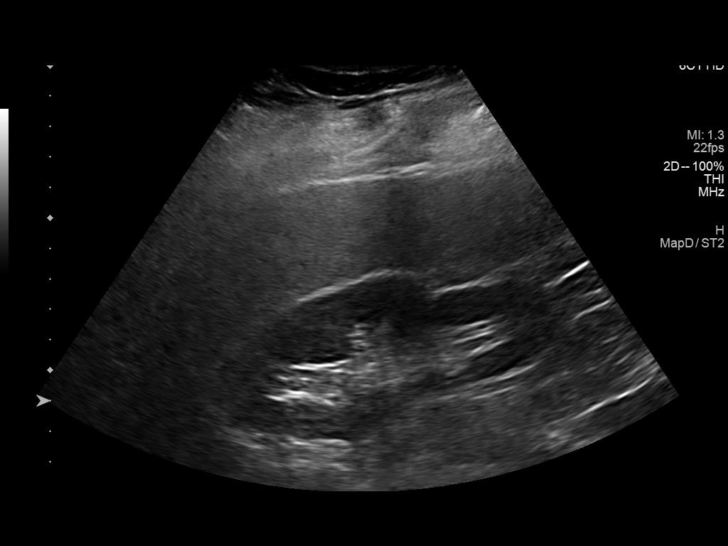
[im 4/39]
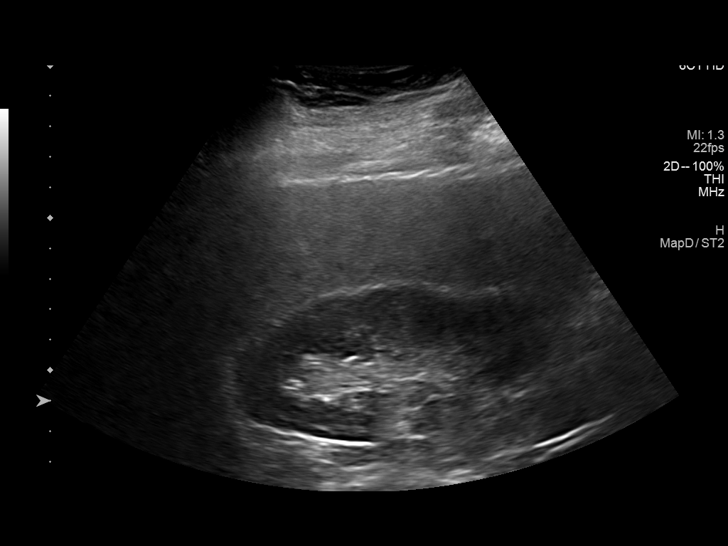
[im 7/39]
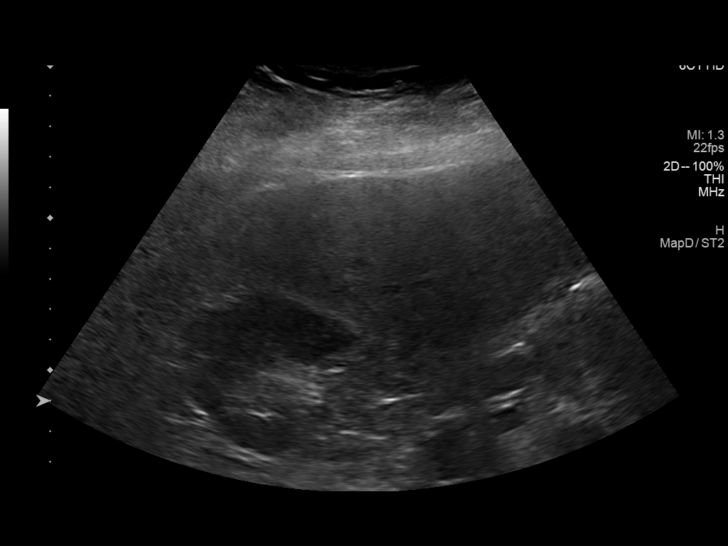
[im 10/39]
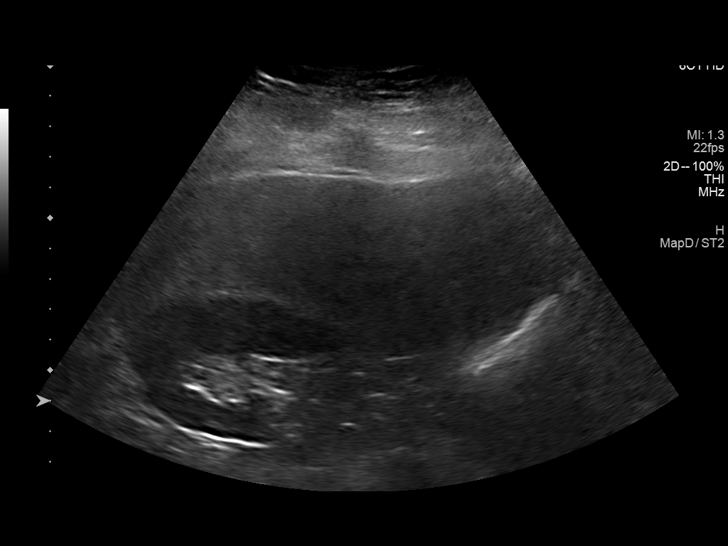
[im 13/39]
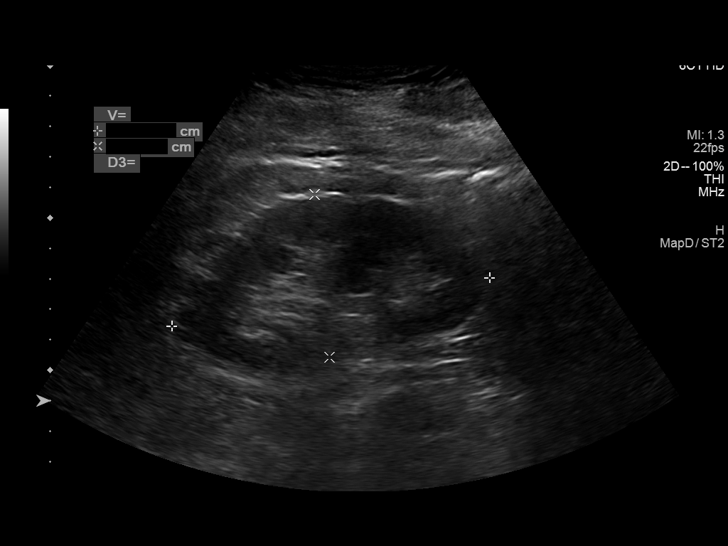
[im 15/39]
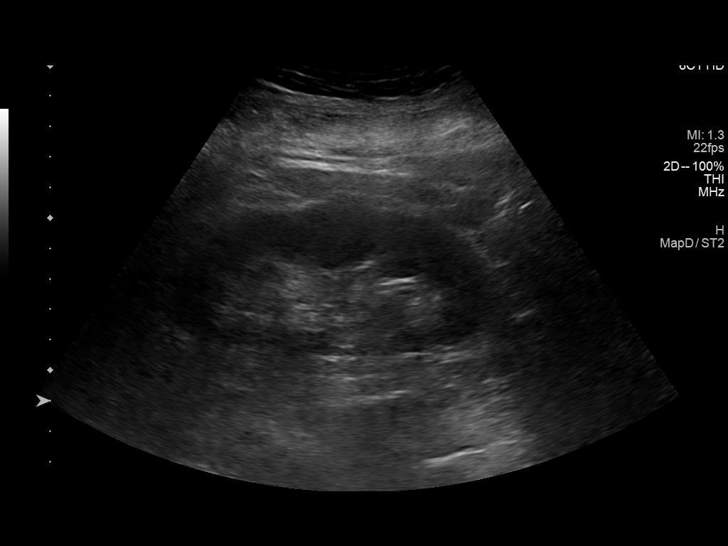
[im 18/39]
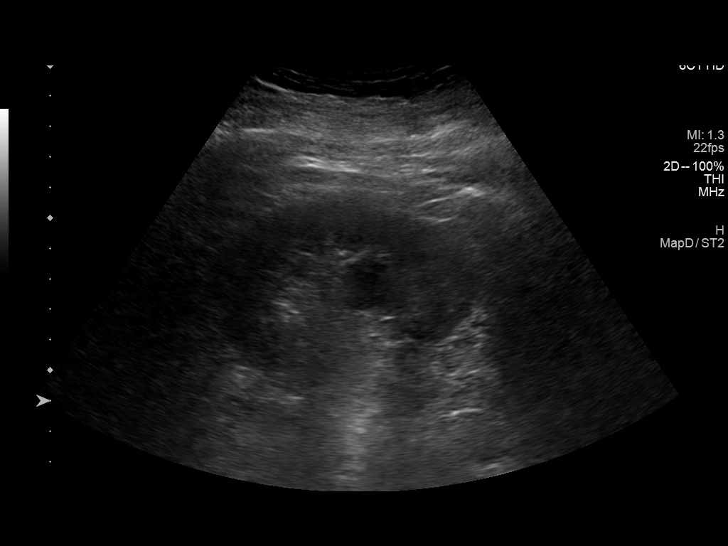
[im 21/39]
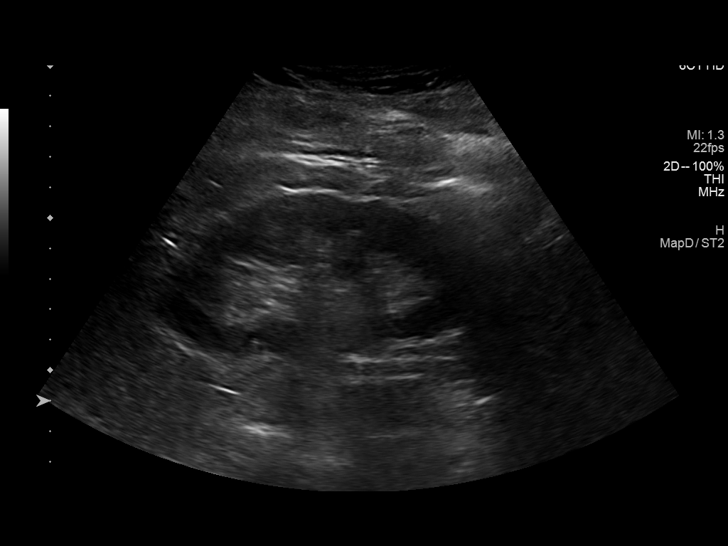
[im 24/39]
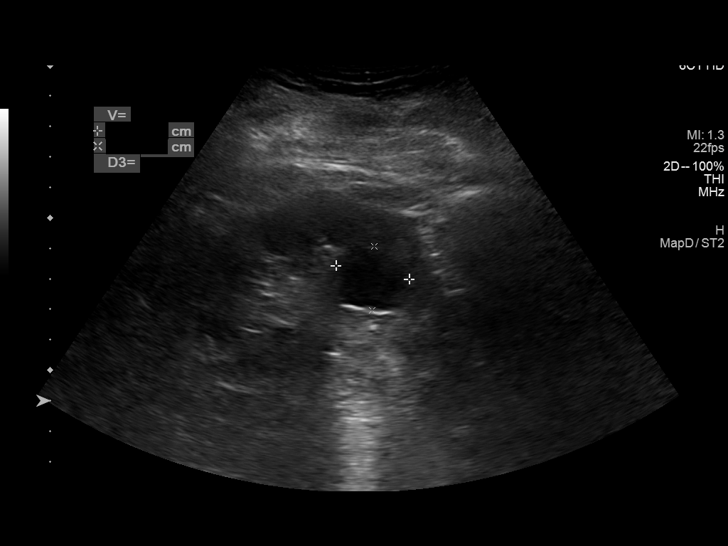
[im 26/39]
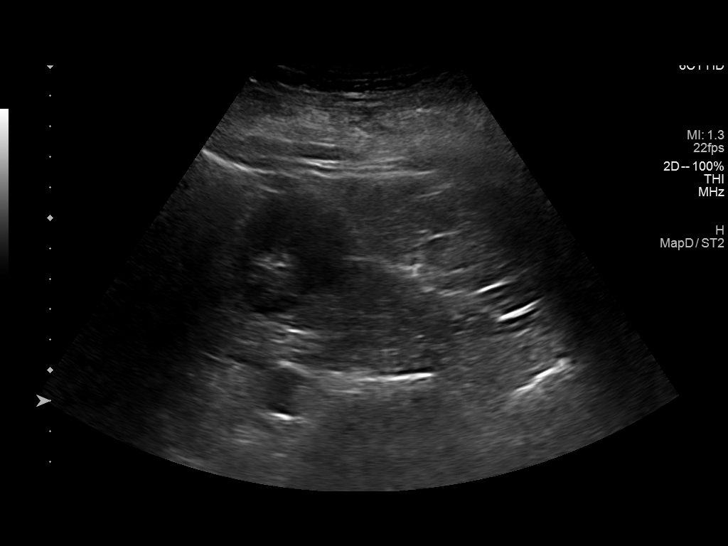
[im 29/39]
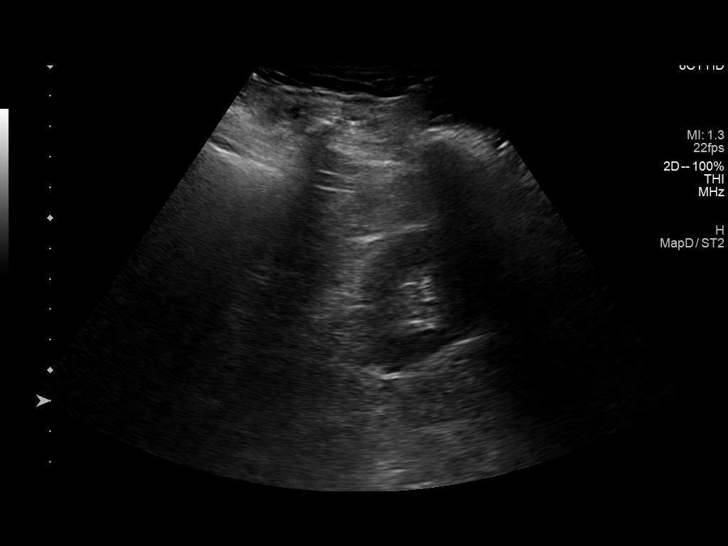
[im 32/39]
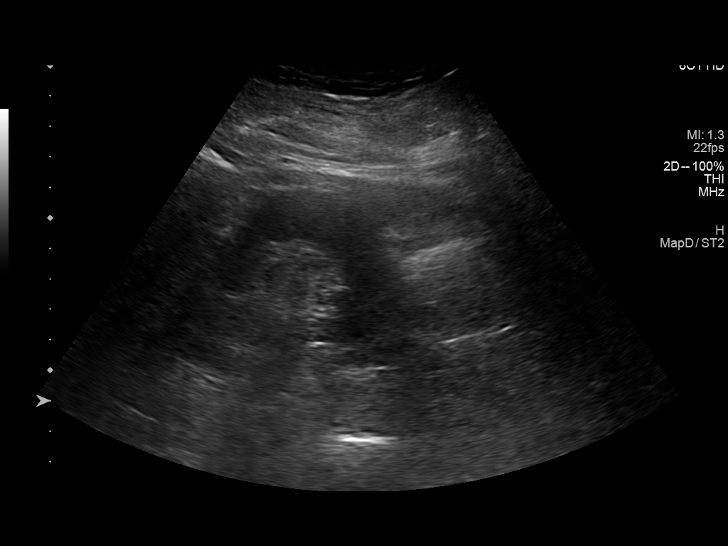
[im 35/39]
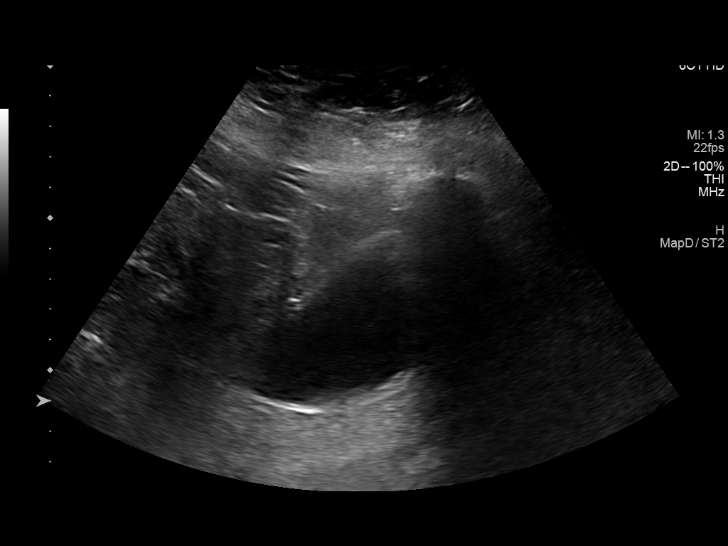
[im 39/39]
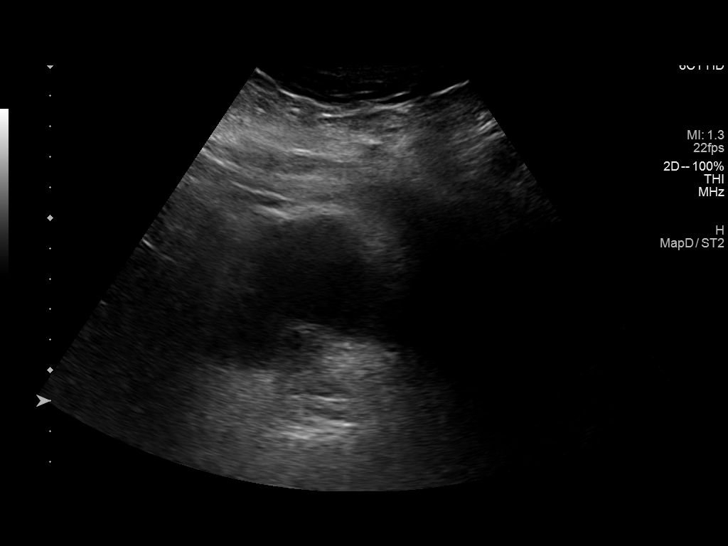

[14 of 25 positions shown; findings below may reference images not displayed]

FINDINGS: Right Kidney:

Renal measurements: 11.3 x 4.8 x 5.9 cm = volume: 167 mL.
Echogenicity within normal limits. No nephrolithiasis or
hydronephrosis. No focal renal mass.

Left Kidney:

Renal measurements: 10.6 x 5.4 x 7.0 cm = volume: 207 mL.
Echogenicity within normal limits. No nephrolithiasis or
hydronephrosis. 2.4 x 2.1 x 2.3 cm simple cyst present at the
mid-lower aspect of the left kidney.

Bladder:

Appears normal for degree of bladder distention.

Other:

None.
IMPRESSION: 1. Normal renal echogenicity.  No hydronephrosis.
2. 2.4 cm simple left renal cyst.

## 2021-07-12 DIAGNOSIS — M6281 Muscle weakness (generalized): Secondary | ICD-10-CM | POA: Diagnosis not present

## 2021-07-12 DIAGNOSIS — R2689 Other abnormalities of gait and mobility: Secondary | ICD-10-CM | POA: Diagnosis not present

## 2021-07-12 DIAGNOSIS — R296 Repeated falls: Secondary | ICD-10-CM | POA: Diagnosis not present

## 2021-07-16 DIAGNOSIS — I110 Hypertensive heart disease with heart failure: Secondary | ICD-10-CM

## 2021-07-16 DIAGNOSIS — I4811 Longstanding persistent atrial fibrillation: Secondary | ICD-10-CM

## 2021-07-16 DIAGNOSIS — E1121 Type 2 diabetes mellitus with diabetic nephropathy: Secondary | ICD-10-CM

## 2021-07-16 DIAGNOSIS — E039 Hypothyroidism, unspecified: Secondary | ICD-10-CM | POA: Diagnosis not present

## 2021-07-16 DIAGNOSIS — E782 Mixed hyperlipidemia: Secondary | ICD-10-CM | POA: Diagnosis not present

## 2021-07-25 DIAGNOSIS — M1712 Unilateral primary osteoarthritis, left knee: Secondary | ICD-10-CM | POA: Diagnosis not present

## 2021-07-29 ENCOUNTER — Telehealth: Payer: Self-pay

## 2021-07-29 NOTE — Progress Notes (Signed)
Pt confirmed appt with CPP on 08/04/21  April Munoz, [place] Clinical Pharmacist Assistant  [phone]

## 2021-08-04 ENCOUNTER — Other Ambulatory Visit: Payer: Self-pay

## 2021-08-04 ENCOUNTER — Ambulatory Visit (INDEPENDENT_AMBULATORY_CARE_PROVIDER_SITE_OTHER): Payer: PPO

## 2021-08-04 DIAGNOSIS — I4811 Longstanding persistent atrial fibrillation: Secondary | ICD-10-CM

## 2021-08-04 DIAGNOSIS — E1121 Type 2 diabetes mellitus with diabetic nephropathy: Secondary | ICD-10-CM

## 2021-08-04 DIAGNOSIS — I110 Hypertensive heart disease with heart failure: Secondary | ICD-10-CM

## 2021-08-04 NOTE — Progress Notes (Signed)
Chronic Care Management Pharmacy Note  08/04/2021 Name:  April Munoz MRN:  [id number] DOB:  May 20, 1951   Plan Recommendations:  Recommend higher statin, patient said she hadn't been on a higher dose before but couldn't fully remember Patient is unsure of when last DEXA was and CPP couldn't find in chart. If there isn't anything present, it may be because she hasn't had one. Will recommend DEXA scan if not present  Subjective: April Munoz is an 71 y.o. year old female who is a primary patient of Cox, Kirsten, MD.  The CCM team was consulted for assistance with disease management and care coordination needs.    Engaged with patient face to face for follow up visit in response to provider referral for pharmacy case management and/or care coordination services.   Consent to Services:  The patient was given the following information about Chronic Care Management services today, agreed to services, and gave verbal consent: 1. CCM service includes personalized support from designated clinical staff supervised by the primary care provider, including individualized plan of care and coordination with other care providers 2. 24/7 contact phone numbers for assistance for urgent and routine care needs. 3. Service will only be billed when office clinical staff spend 20 minutes or more in a month to coordinate care. 4. Only one practitioner may furnish and bill the service in a calendar month. 5.The patient may stop CCM services at any time (effective at the end of the month) by phone call to the office staff. 6. The patient will be responsible for cost sharing (co-pay) of up to 20% of the service fee (after annual deductible is met). Patient agreed to services and consent obtained.  Patient Care Team: [name], MD as PCP - General (Family Medicine) Richardo Priest, MD as PCP - Cardiology (Cardiology) Constance Haw, MD as PCP - Electrophysiology (Cardiology) Richardo Priest, MD as Consulting  Physician (Cardiology) Misenheimer, Christia Reading, MD as Consulting Physician (Unknown Physician Specialty) Pollyann Samples, MD as Consulting Physician (General Surgery) Lane Hacker, [facility] as Pharmacist (Pharmacist)  Recent office visits: 11/02/2020 - ordered ekg, echo, carotid ultrasound and labs.  10/01/2020 - add Farxiga 5 mg daily. Continue with Trulicity. Follow-up with psychiatry.  05/27/2020 - increase zolpidem 10 mg once daily. Work on [employer] and exercise. Lab results: Sugar elevated. TSH abnormal - increase Synthroid 88 mcg daily in am. Continue vitamin D supplement. LDL elevated and TG elevated. LFT and Kidney function normal. No evidence of B12 deficiency.   Recent consult visits: 11/15/2020 - cardio - cha2ds2-vasc 6. Start flecanide 100 mg.  11/05/2020 - stop Eliquis and start Xarelto 20 mg.   08/05/2020 - Cardio - continue beta-blocker and anticoagulation. Order CT scan and PFTs.  06/02/2020 - GI - continue PPI. Fiber daily and miralax prn for constipation.  05/26/2020 - pulmonology - labs ordered. Schedule CT in 3 months.  04/26/2020 - Cardio - take diuretic 3 days a week. Refer to pulmonary if atypical pulmonary infection. Continue anticoagulant. Resume statin since previous TIA and hyperlipidemia.  03/09/2020 - nephrology - manage blood pressure. Low sodium diet. Continue ACE/ARB.    Hospital visits:   Objective:  Lab Results  Component Value Date   CREATININE 1.06 (H) 06/15/2021   BUN 15 06/15/2021   GFRNONAA 53 (L) 09/03/2020   GFRAA 61 09/03/2020   NA 145 (H) 06/15/2021   K 4.4 06/15/2021   CALCIUM 10.0 06/15/2021   CO2 22 06/15/2021    Lab Results  Component  Value Date/Time   HGBA1C 6.1 (H) 06/15/2021 10:19 AM   HGBA1C 6.1 (H) 03/10/2021 09:06 AM   MICROALBUR 80 03/15/2021 10:11 PM   MICROALBUR 150 05/27/2020 09:39 AM    Last diabetic Eye exam:  Lab Results  Component Value Date/Time   HMDIABEYEEXA No Retinopathy 10/14/2020 12:00 AM    Last  diabetic Foot exam: No results found for: HMDIABFOOTEX   Lab Results  Component Value Date   CHOL 219 (H) 06/15/2021   HDL 75 06/15/2021   LDLCALC 115 (H) 06/15/2021   TRIG 168 (H) 06/15/2021   CHOLHDL 2.9 06/15/2021    Hepatic Function Latest Ref Rng & Units 06/15/2021 03/10/2021 09/14/2020  Total Protein 6.0 - 8.5 g/dL 6.6 6.4 6.0  Albumin 3.7 - 4.7 g/dL 4.4 4.3 3.8  AST 0 - 40 IU/L _0 ALT 0 - 32 IU/L 24 18 42(H)  Alk Phosphatase 44 - 121 IU/L 100 74 115  Total Bilirubin 0.0 - 1.2 mg/dL 0.5 0.5 0.4    Lab Results  Component Value Date/Time   TSH 4.170 06/15/2021 10:19 AM   TSH 3.900 01/31/2021 10:17 AM    CBC Latest Ref Rng & Units 06/15/2021 03/10/2021 11/26/2020  WBC 3.4 - 10.8 x10E3/uL 8.7 6.9 -  Hemoglobin 11.1 - 15.9 g/dL 15.4 13.6 12.9  Hematocrit 34.0 - 46.6 % 47.2(H) 40.8 38.0  Platelets 150 - 450 x10E3/uL 412 309 -    Lab Results  Component Value Date/Time   VD25OH 49.8 09/03/2020 09:53 AM   VD25OH 34.4 05/31/2020 09:18 AM    Clinical ASCVD: No  The 10-year ASCVD risk score (Arnett DK, et al., 2019) is: 29%   Values used to calculate the score:     Age: 71 years     Sex: Female     Is Non-Hispanic African American: No     Diabetic: Yes     Tobacco smoker: No     Systolic Blood Pressure: 354 mmHg     Is BP treated: Yes     HDL Cholesterol: 75 mg/dL     Total Cholesterol: 219 mg/dL    Depression screen [facility] 2/9 06/19/2021 03/08/2021 02/14/2021  Decreased Interest _1 Down, Depressed, Hopeless _2 PHQ - 2 Score _3 Altered sleeping _4 Tired, decreased energy _5 Change in appetite 1 0 3  Feeling bad or failure about yourself  2 0 0  Trouble concentrating 2 0 3  Moving slowly or fidgety/restless _6 Suicidal thoughts 0 0 0  PHQ-9 Score _7 Difficult doing work/chores Somewhat difficult Somewhat difficult Not difficult at all  Some recent data might be hidden      Social History   Tobacco Use  Smoking Status Never   Smokeless Tobacco Never   BP Readings from Last 3 Encounters:  06/19/21 132/82  04/18/21 116/80  04/13/21 124/68   Pulse Readings from Last 3 Encounters:  06/19/21 74  04/18/21 66  04/13/21 80   Wt Readings from Last 3 Encounters:  06/19/21 171 lb (77.6 kg)  04/18/21 183 lb (83 kg)  04/13/21 180 lb (81.6 kg)    Assessment/Interventions: Review of patient past medical history, allergies, medications, health status, including review of consultants reports, laboratory and other test data, was performed as part of comprehensive evaluation and provision of chronic care management services.   SDOH:  (Social Determinants of Health) assessments and interventions performed: Yes  CCM Care Plan  No Active Allergies   Medications Reviewed Today     Reviewed by Lane Hacker, [facility] (Pharmacist) on 08/04/21 at 1153  Med List Status: <None>   Medication Order Taking? Sig Documenting Provider Last Dose Status Informant  acyclovir (ZOVIRAX) 800 MG tablet [id number]  TAKE 1 TABLET BY MOUTH FOUR TIMES DAILY Cox, Kirsten, MD  Active   atorvastatin (LIPITOR) 20 MG tablet [id number]  Take 1 tablet (20 mg total) by mouth daily. [name], MD  Active   B Complex-C-Folic Acid TABS [id number] No Take 1 tablet by mouth daily. [provider] Taking Active Self  Calcium Carb-Cholecalciferol (LIQUID CALCIUM WITH D3) [phone] MG-UNIT CAPS [id number] No Take 600 mg by mouth daily. [provider] Taking Active Self  Calcium-Vitamin D-Vitamin K (VIACTIV CALCIUM PLUS D) 650-12.5-40 MG-MCG-MCG CHEW [id number] No Chew 1 capsule by mouth daily. [provider] Taking Active   Coenzyme Q10 100 MG TABS [id number] No Take 1 tablet by mouth daily. [provider] Taking Active Self  Continuous Blood Gluc Receiver (FREESTYLE LIBRE 2 READER) [place] [id number] No E11.21 Check blood sugar 4 times daily as directed Cox, Kirsten, MD Taking Active Self  Continuous Blood Gluc Sensor  (FREESTYLE LIBRE 2 SENSOR) [place] [id number] No E11.21 Change sensor every 14 days as directed Cox, Elnita Maxwell, MD Taking Active Self  cyanocobalamin (,VITAMIN B-12,) 1000 MCG/ML injection [id number]  INJECT 1ML INTO THE MUSCLE EVERY 30 DAYS Cox, Kirsten, MD  Active   dapagliflozin propanediol (FARXIGA) 10 MG TABS tablet [id number] No Take 10 mg by mouth daily. [provider] Taking Active   Dulaglutide (TRULICITY) 7.97 KQ/2.0UO SOPN [id number] No Inject 0.75 mg into the skin once a week. Cox, Kirsten, MD Taking Active Self  EPINEPHrine 0.3 mg/0.3 mL IJ SOAJ injection [id number] No Inject 0.3 mg into the muscle as needed. [name], MD Taking Active            Med Note Alric Ran Apr 18, 2021 12:02 PM)    flecainide (TAMBOCOR) 100 MG tablet [id number]  TAKE ONE (1) TABLET BY MOUTH TWO (2) TIMES DAILY Camnitz, Will Hassell Done, MD  Active   levothyroxine (SYNTHROID) 75 MCG tablet [id number] No TAKE ONE (1) TABLET ONCE DAILY BEFORE BREAKFAST Cox, Kirsten, MD Taking Active   losartan (COZAAR) 50 MG tablet [id number] No Take 1 tablet (50 mg total) by mouth daily. Cox, Kirsten, MD Taking Active   Lutein 20 MG TABS [id number] No Take 1 tablet by mouth daily. [provider] Taking Active   Melatonin 10 MG TABS [id number] No Take 10 mg by mouth 4 (four) times a week. [provider] Taking Active   metFORMIN (GLUCOPHAGE) 1000 MG tablet [id number] No TAKE ONE TABLET TWICE DAILY WITH MEALS Cox, Kirsten, MD Taking Active   metoprolol succinate (TOPROL-XL) 50 MG 24 hr tablet [id number]  Take 1 tablet (50 mg total) by mouth at bedtime. Camnitz, Will Hassell Done, MD  Active   Multiple Vitamin (MULTIVITAMIN WITH MINERALS) TABS tablet [id number] No Take 1 tablet by mouth daily. [provider] Taking Active Self  OLANZapine (ZYPREXA) 2.5 MG tablet [id number]  Take 1 tablet (2.5 mg total) by mouth at bedtime. Cox, Kirsten, MD  Active   omega-3 acid ethyl esters (LOVAZA) 1 g capsule [id number] No  Take 2 capsules by mouth 2 (two) times daily. [provider] Taking Active Self  omeprazole (PRILOSEC) 20 MG capsule [id number] No TAKE ONE (1) CAPSULE EACH DAY Marge Duncans, PA-C Taking  Active   Probiotic Product (PROBIOTIC & ACIDOPHILUS EX ST PO) [id number] No Take 1 tablet by mouth daily.  [provider] Taking Active Self           Med Note Stevan Born   Tue May 27, 2019  3:15 PM)    solifenacin (VESICARE) 5 MG tablet [id number] No Take 1 tablet (5 mg total) by mouth daily. Cox, Kirsten, MD Taking Active   torsemide (DEMADEX) 20 MG tablet [id number] No Take 1 tablet (20 mg total) by mouth 3 (three) times a week. Please take this on Monday, Wednesday, and Friday. Richardo Priest, MD Taking Active   venlafaxine XR (EFFEXOR XR) 150 MG 24 hr capsule [id number] No Take 1 capsule (150 mg total) by mouth daily with breakfast. Cox, Kirsten, MD Taking Active   venlafaxine XR (EFFEXOR-XR) 75 MG 24 hr capsule [id number] No TAKE 1 CAPSULE DAILY WITH BREAKFAST FOR 7 DAYS; THEN 2 CAPSULES DAILY WITH BREAKFAST FOR 23 DAYS. Rip Harbour, NP Taking Active   Vitamin D, Ergocalciferol, (DRISDOL) 1.25 MG (50000 UNIT) CAPS capsule [id number] No Take 1 capsule (50,000 Units total) by mouth every 7 (seven) days. Marge Duncans, PA-C Taking Active   XARELTO 20 MG TABS tablet [id number] No TAKE ONE (1) TABLET ONCE DAILY WITH SUPPER Camnitz, Ocie Doyne, MD Taking Active   Zinc Sulfate (ZINC 15 PO) [id number] No Take 150 mg by mouth 3 (three) times a week. [provider] Taking Active Self            Patient Active Problem List   Diagnosis Date Noted   Frequent falls 06/25/2021   Imbalance 06/25/2021   Acquired hypothyroidism 06/25/2021   Acquired thrombophilia ([place]) 06/25/2021   Head contusion 06/19/2021   Urinary frequency 04/13/2021   Atypical atrial flutter (HCC)    Atrial fibrillation (HCC)    Anxiety    Arthritis    Ataxic gait    Full dentures    Incontinence    Insomnia     Other amnesia    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    Primary insomnia    Wears glasses    Diabetic glomerulopathy ([employer]) 09/16/2019   Urge incontinence 09/16/2019   Secondary hypothyroidism 09/16/2019   Longstanding persistent atrial fibrillation ([place]) 09/16/2019   Major depressive disorder, recurrent episode, moderate ([place]) 09/16/2019   Vitamin D deficiency disease 09/16/2019   Chronic anticoagulation 05/31/2018   High risk medication use 11/07/2017   B12 deficiency 10/31/2017   Neuropathy in diabetes ([place]) 10/31/2017   Esophageal stricture 06/05/2017   Murmur, cardiac 08/15/2015   Restless legs syndrome 03/14/2013   Hypertensive heart disease 02/24/2013   Fibromyalgia 02/24/2013   Hereditary and idiopathic peripheral neuropathy 02/24/2013   Mixed hyperlipidemia 02/24/2013   Osteoporosis 02/24/2013   Thoracic or lumbosacral neuritis or radiculitis 02/24/2013   Hyperlipidemia 02/24/2013    Immunization History  Administered Date(s) Administered   Fluad Quad(high Dose 65+) 03/18/2019, 04/25/2020   Influenza-Unspecified 03/18/2019, 05/14/2020   Moderna Sars-Covid-2 Vaccination 09/09/2019, 10/04/2019, 04/19/2020   Pneumococcal Conjugate-13 05/14/2015   Pneumococcal Polysaccharide-23 05/16/2013, 07/27/2017    Conditions to be addressed/monitored:  Hypertension, Hyperlipidemia, Diabetes, Hypothyroidism, Depression, Anxiety, Osteoporosis and Insomnia.   Care Plan : [place]  Updates made by Lane Hacker, RPH since 08/04/2021 12:00 AM     Problem: afib, diabetes, hypertension, hyperlipidemia   Priority: High  Onset Date: 08/30/2020     Goal: Disease Management   Start Date: 08/30/2020  Expected End Date: 08/30/2021  Recent Progress: On track  Priority: High  Note:   Current Barriers:  Unable to independently afford treatment regimen Unable to maintain control of diabetes  Pharmacist Clinical Goal(s):  Over the next 90  days, patient will verbalize ability to afford treatment regimen achieve control of diabetes as evidenced by a1c through collaboration with PharmD and provider.   Interventions: 1:1 collaboration with Cox, Elnita Maxwell, MD regarding development and update of comprehensive plan of care as evidenced by provider attestation and co-signature Inter-disciplinary care team collaboration (see longitudinal plan of care) Comprehensive medication review performed; medication list updated in electronic medical record  Cardio (BP goal <140/90) BP Readings from Last 3 Encounters:  06/19/21 132/82  04/18/21 116/80  04/13/21 124/68  -controlled -Current treatment: Losartan 50 mg daily Appropriate, Query effective, Safe, Accessible Metoprolol XL 32m QD Appropriate, Query effective, Safe, Accessible -Medications previously tried: none reported  -Current home readings:   -Jan 25784 1696-295systolic but didn't have numbers -Current dietary habits: reports eating during the night some.  -Current exercise habits: none reported  -Denies hypotensive/hypertensive symptoms -Educated on BP goals and benefits of medications for prevention of heart attack, stroke and kidney damage; Daily salt intake goal < 2300 mg; Exercise goal of 150 minutes per week; Importance of home blood pressure monitoring; Proper BP monitoring technique; -Counseled to monitor BP at home daily, document, and provide log at future appointments -Counseled on diet and exercise extensively Jan 2023: Patient states 1284-132systolic normal but doesn't write down. Has appt with Cardio next week. Will write down values and bring into Cardio visit   Hyperlipidemia: (LDL goal < 55) The 10-year ASCVD risk score (Arnett DK, et al., 2019) is: 29%   Values used to calculate the score:     Age: [age]years     Sex: Female     Is Non-Hispanic African American: No     Diabetic: Yes     Tobacco smoker: No     Systolic Blood Pressure: 1440mmHg     Is BP  treated: Yes     HDL Cholesterol: 75 mg/dL     Total Cholesterol: 219 mg/dL Lab Results  Component Value Date   CHOL 219 (H) 06/15/2021   CHOL 187 03/10/2021   CHOL 156 09/03/2020   Lab Results  Component Value Date   HDL 75 06/15/2021   HDL 62 03/10/2021   HDL 57 09/03/2020   Lab Results  Component Value Date   LDLCALC 115 (H) 06/15/2021   LDLCALC 94 03/10/2021   LDLCALC 73 09/03/2020   Lab Results  Component Value Date   TRIG 168 (H) 06/15/2021   TRIG 181 (H) 03/10/2021   TRIG 153 (H) 09/03/2020   Lab Results  Component Value Date   CHOLHDL 2.9 06/15/2021   CHOLHDL 3.0 03/10/2021   CHOLHDL 2.7 09/03/2020  No results found for: LDLDIRECT -not ideally controlled -Current treatment: Atorvastatin 10 mg daily Query Appropriate, Query effective, Safe, Accessible -Medications previously tried: pravastatin, Lovaza -Current dietary patterns: reports eating at night. Loves eggs.  -Current exercise habits: none reported -Educated on Cholesterol goals;  Benefits of statin for ASCVD risk reduction; Importance of limiting foods high in cholesterol; Exercise goal of 150 minutes per week; -Counseled on diet and exercise extensively Recommended to continue current medication Jan 2023: Recommend increasing statin  Diabetes (A1c goal <7%) Lab Results  Component Value Date   HGBA1C 6.1 (H) 06/15/2021   HGBA1C 6.1 (H) 03/10/2021   HGBA1C 7.1 (  H) 09/03/2020   Lab Results  Component Value Date   MICROALBUR 80 03/15/2021   LDLCALC 115 (H) 06/15/2021   CREATININE 1.06 (H) 06/15/2021   Lab Results  Component Value Date   NA 145 (H) 06/15/2021   K 4.4 06/15/2021   CREATININE 1.06 (H) 06/15/2021   EGFR 56 (L) 06/15/2021   GFRNONAA 53 (L) 09/03/2020   GLUCOSE 107 (H) 06/15/2021   Lab Results  Component Value Date   WBC 8.7 06/15/2021   HGB 15.4 06/15/2021   HCT 47.2 (H) 06/15/2021   MCV 94 06/15/2021   PLT 412 06/15/2021  -Controlled -Current medications: metformin  1000 mg daily Appropriate, Effective, Safe, Accessible Farxiga 10 mg daily Appropriate, Effective, Safe, Accessible Trulicity 3.47 mg weekly (PAP) Appropriate, Effective, Safe, Accessible -Medications previously tried: none reported  -Current home glucose readings fasting glucose: Jan 2023: Doesn't write them down but states no lows. Recommended to start writing down post prandial glucose: not reported -Denies hypoglycemic/hyperglycemic symptoms -Current meal patterns:  Patient reports eating during the night.  Not eating well since cardioversion.  -Current exercise: none reported -Educated onA1c and blood sugar goals; Complications of diabetes including kidney damage, retinal damage, and cardiovascular disease; Benefits of weight loss; Benefits of routine self-monitoring of blood sugar; -Counseled to check feet daily and get yearly eye exams -Counseled on diet and exercise extensively -Recommended to continue current medication   Atrial Fibrillation (Goal: prevent stroke and major bleeding) -controlled  -CHADSVASC: 6 -Current treatment: Rate control:  metoprolol xl 63m QD Appropriate, Effective, Safe, Accessible flecanide 100 mg bid Appropriate, Effective, Safe, Accessible Losartan 523mQD Appropriate, Effective, Safe, Accessible Anticoagulation:  Xarelto 20 mg daily Appropriate, Effective, Safe, Accessible -Medications previously tried: Eliquis  -Home BP and HR readings: none reported  -Counseled on increased risk of stroke due to Afib and benefits of anticoagulation for stroke prevention; importance of adherence to anticoagulant exactly as prescribed; avoidance of NSAIDs due to increased bleeding risk with anticoagulants; -Counseled on diet and exercise extensively Recommended to continue current medication -Recommended to continue current medication  Osteoporosis / Osteopenia (Goal  Minimize risk of break or fracture) -controlled -Last DEXA Scan: not available in chart    -Current treatment  calcium/vitamin D/vitamin k chew daily Appropriate, Effective, Safe, Accessible Vitamin D 50,000 units weekly on Monday Appropriate, Effective, Safe, Accessible -Medications previously tried: none reported  -Recommend [phone] units of vitamin D daily. Recommend 1200 mg of calcium daily from dietary and supplemental sources. Recommend weight-bearing and muscle strengthening exercises for building and maintaining bone density. -Counseled on diet and exercise extensively Jan 2023: Patient unsure when last Dexa was, will ask PCP  Depression (Goal: manage symptoms of depression) -not ideally controlled Depression screen [facility]/9 06/19/2021 03/08/2021 02/14/2021  Decreased Interest _0 Down, Depressed, Hopeless _1 PHQ - 2 Score _2 Altered sleeping _3 Tired, decreased energy _4 Change in appetite 1 0 3  Feeling bad or failure about yourself  2 0 0  Trouble concentrating 2 0 3  Moving slowly or fidgety/restless _5 Suicidal thoughts 0 0 0  PHQ-9 Score _6 Difficult doing work/chores Somewhat difficult Somewhat difficult Not difficult at all  Some recent data might be hidden  -Current treatment  Venlafaxine XR 15029mD Appropriate, Query effective, Safe, Accessible Olanzapine 2.5mg34m Appropriate, Query effective, Query Safe, Accessible Solifenacin 5mg 40mAppropriate, Query effective, Query Safe, Accessible -Medications previously  tried: Trintellix, Rexulti, states she has tried other medications in the past but has been on venlafaxine of and on for 30 years, venlafaxine Jan 2023: Patient states her grandson and Fiance just moved in with her and she is happy to help them, but stated it is a stressful situation. Not open to changing things now with all the change that's going on in her life  Insomnia (Goal: improve sleep) -controlled (Sleeping 7-8 hours/night) -Current treatment  Zolpidem 10 mg at bedtime prn sleep Appropriate, Effective, Safe,  Accessible -Medications previously tried: melatonin, mirtazapine -Counseled on good sleep hygeine.  -Recommended to continue current medication    Patient Goals/Self-Care Activities Over the next 90 days, patient will:  - take medications as prescribed focus on medication adherence by using pill box check glucose daily , document, and provide at future appointments check blood pressure weekly, document, and provide at future appointments  Follow Up Plan: Telephone follow up appointment with care management team member scheduled for: March 2023  [place] Constable, Sherian Rein.D. - [phone]        Medication Assistance: Application for Trulicity and Farxiga  medication assistance program. in process.  Anticipated assistance start date 12/29/2020.  See plan of care for additional detail. Patient currently approved for Trintellix through 07/16/2021. Application for Rexulti in process.   Patient's preferred pharmacy is:  [facility], [place] [address] Phone: [phone] Fax: [place], [place] [address] [place] [address] Phone: [phone] Fax: [place], [place] [address] [place] [address] Phone: [phone] Fax: [phone]   Uses pill box? Yes Pt endorses good compliance  We discussed: Benefits of medication synchronization, packaging and delivery as well as enhanced pharmacist oversight with [employer]. Patient decided to: Continue current medication management strategy  Care Plan and Follow Up Patient Decision:  Patient agrees to Care Plan and Follow-up.  Plan: Telephone follow up appointment with care management team member scheduled for:  March 2023  [place] Constable, [place].D. - [phone]

## 2021-08-04 NOTE — Patient Instructions (Signed)
Visit Information   Goals Addressed   None    Patient Care Plan: CCM Pharmacy Care Plan     Problem Identified: afib, diabetes, hypertension, hyperlipidemia   Priority: High  Onset Date: 08/30/2020     Goal: Disease Management   Start Date: 08/30/2020  Expected End Date: 08/30/2021  Recent Progress: On track  Priority: High  Note:   Current Barriers:  Unable to independently afford treatment regimen Unable to maintain control of diabetes  Pharmacist Clinical Goal(s):  Over the next 90 days, patient will verbalize ability to afford treatment regimen achieve control of diabetes as evidenced by a1c through collaboration with PharmD and provider.   Interventions: 1:1 collaboration with Cox, Elnita Maxwell, MD regarding development and update of comprehensive plan of care as evidenced by provider attestation and co-signature Inter-disciplinary care team collaboration (see longitudinal plan of care) Comprehensive medication review performed; medication list updated in electronic medical record  Cardio (BP goal <140/90) BP Readings from Last 3 Encounters:  06/19/21 132/82  04/18/21 116/80  04/13/21 124/68  -controlled -Current treatment: Losartan 50 mg daily Appropriate, Query effective, Safe, Accessible Metoprolol XL 66m QD Appropriate, Query effective, Safe, Accessible -Medications previously tried: none reported  -Current home readings:   -Jan 23299 1242-683systolic but didn't have numbers -Current dietary habits: reports eating during the night some.  -Current exercise habits: none reported  -Denies hypotensive/hypertensive symptoms -Educated on BP goals and benefits of medications for prevention of heart attack, stroke and kidney damage; Daily salt intake goal < 2300 mg; Exercise goal of 150 minutes per week; Importance of home blood pressure monitoring; Proper BP monitoring technique; -Counseled to monitor BP at home daily, document, and provide log at future  appointments -Counseled on diet and exercise extensively Jan 2023: Patient states [id number] normal but doesn't write down. Has appt with Cardio next week. Will write down values and bring into Cardio visit   Hyperlipidemia: (LDL goal < 55) The 10-year ASCVD risk score (Arnett DK, et al., 2019) is: 29%   Values used to calculate the score:     Age: [age]years     Sex: Female     Is Non-Hispanic African American: No     Diabetic: Yes     Tobacco smoker: No     Systolic Blood Pressure: 1297mmHg     Is BP treated: Yes     HDL Cholesterol: 75 mg/dL     Total Cholesterol: 219 mg/dL Lab Results  Component Value Date   CHOL 219 (H) 06/15/2021   CHOL 187 03/10/2021   CHOL 156 09/03/2020   Lab Results  Component Value Date   HDL 75 06/15/2021   HDL 62 03/10/2021   HDL 57 09/03/2020   Lab Results  Component Value Date   LDLCALC 115 (H) 06/15/2021   LDLCALC 94 03/10/2021   LDLCALC 73 09/03/2020   Lab Results  Component Value Date   TRIG 168 (H) 06/15/2021   TRIG 181 (H) 03/10/2021   TRIG 153 (H) 09/03/2020   Lab Results  Component Value Date   CHOLHDL 2.9 06/15/2021   CHOLHDL 3.0 03/10/2021   CHOLHDL 2.7 09/03/2020  No results found for: LDLDIRECT -not ideally controlled -Current treatment: Atorvastatin 10 mg daily Query Appropriate, Query effective, Safe, Accessible -Medications previously tried: pravastatin, Lovaza -Current dietary patterns: reports eating at night. Loves eggs.  -Current exercise habits: none reported -Educated on Cholesterol goals;  Benefits of statin for ASCVD risk reduction; Importance of limiting foods high in cholesterol; Exercise  goal of 150 minutes per week; -Counseled on diet and exercise extensively Recommended to continue current medication Jan 2023: Recommend increasing statin  Diabetes (A1c goal <7%) Lab Results  Component Value Date   HGBA1C 6.1 (H) 06/15/2021   HGBA1C 6.1 (H) 03/10/2021   HGBA1C 7.1 (H) 09/03/2020   Lab  Results  Component Value Date   MICROALBUR 80 03/15/2021   LDLCALC 115 (H) 06/15/2021   CREATININE 1.06 (H) 06/15/2021   Lab Results  Component Value Date   NA 145 (H) 06/15/2021   K 4.4 06/15/2021   CREATININE 1.06 (H) 06/15/2021   EGFR 56 (L) 06/15/2021   GFRNONAA 53 (L) 09/03/2020   GLUCOSE 107 (H) 06/15/2021   Lab Results  Component Value Date   WBC 8.7 06/15/2021   HGB 15.4 06/15/2021   HCT 47.2 (H) 06/15/2021   MCV 94 06/15/2021   PLT 412 06/15/2021  -Controlled -Current medications: metformin 1000 mg daily Appropriate, Effective, Safe, Accessible Farxiga 10 mg daily Appropriate, Effective, Safe, Accessible Trulicity 0.94 mg weekly (PAP) Appropriate, Effective, Safe, Accessible -Medications previously tried: none reported  -Current home glucose readings fasting glucose: Jan 2023: Doesn't write them down but states no lows. Recommended to start writing down post prandial glucose: not reported -Denies hypoglycemic/hyperglycemic symptoms -Current meal patterns:  Patient reports eating during the night.  Not eating well since cardioversion.  -Current exercise: none reported -Educated onA1c and blood sugar goals; Complications of diabetes including kidney damage, retinal damage, and cardiovascular disease; Benefits of weight loss; Benefits of routine self-monitoring of blood sugar; -Counseled to check feet daily and get yearly eye exams -Counseled on diet and exercise extensively -Recommended to continue current medication   Atrial Fibrillation (Goal: prevent stroke and major bleeding) -controlled  -CHADSVASC: 6 -Current treatment: Rate control:  metoprolol xl 87m QD Appropriate, Effective, Safe, Accessible flecanide 100 mg bid Appropriate, Effective, Safe, Accessible Losartan 521mQD Appropriate, Effective, Safe, Accessible Anticoagulation:  Xarelto 20 mg daily Appropriate, Effective, Safe, Accessible -Medications previously tried: Eliquis  -Home BP and HR  readings: none reported  -Counseled on increased risk of stroke due to Afib and benefits of anticoagulation for stroke prevention; importance of adherence to anticoagulant exactly as prescribed; avoidance of NSAIDs due to increased bleeding risk with anticoagulants; -Counseled on diet and exercise extensively Recommended to continue current medication -Recommended to continue current medication  Osteoporosis / Osteopenia (Goal  Minimize risk of break or fracture) -controlled -Last DEXA Scan: not available in chart   -Current treatment  calcium/vitamin D/vitamin k chew daily Appropriate, Effective, Safe, Accessible Vitamin D 50,000 units weekly on Monday Appropriate, Effective, Safe, Accessible -Medications previously tried: none reported  -Recommend [phone] units of vitamin D daily. Recommend 1200 mg of calcium daily from dietary and supplemental sources. Recommend weight-bearing and muscle strengthening exercises for building and maintaining bone density. -Counseled on diet and exercise extensively Jan 2023: Patient unsure when last Dexa was, will ask PCP  Depression (Goal: manage symptoms of depression) -not ideally controlled [facility] 06/19/2021 03/08/2021 02/14/2021  Decreased Interest _0 Down, Depressed, Hopeless _1 PHQ - 2 Score _2 Altered sleeping _3 Tired, decreased energy _4 Change in appetite 1 0 3  Feeling bad or failure about yourself  2 0 0  Trouble concentrating 2 0 3  Moving slowly or fidgety/restless _5 Suicidal thoughts 0 0 0  PHQ-9 Score _6 Difficult doing  work/chores Somewhat difficult Somewhat difficult Not difficult at all  Some recent data might be hidden  -Current treatment  Venlafaxine XR 1552m QD Appropriate, Query effective, Safe, Accessible Olanzapine 2.5852mHS Appropriate, Query effective, Query Safe, Accessible Solifenacin 52m51mS Appropriate, Query effective, Query Safe, Accessible -Medications previously  tried: Trintellix, Rexulti, states she has tried other medications in the past but has been on venlafaxine of and on for 30 years, venlafaxine Jan 2023: Patient states her grandson and Fiance just moved in with her and she is happy to help them, but stated it is a stressful situation. Not open to changing things now with all the change that's going on in her life  Insomnia (Goal: improve sleep) -controlled (Sleeping 7-8 hours/night) -Current treatment  Zolpidem 10 mg at bedtime prn sleep Appropriate, Effective, Safe, Accessible -Medications previously tried: melatonin, mirtazapine -Counseled on good sleep hygeine.  -Recommended to continue current medication    Patient Goals/Self-Care Activities Over the next 90 days, patient will:  - take medications as prescribed focus on medication adherence by using pill box check glucose daily , document, and provide at future appointments check blood pressure weekly, document, and provide at future appointments  Follow Up Plan: Telephone follow up appointment with care management team member scheduled for: March 2023  Nat[place] ConstablehaSherian Rein - [phone]       The patient verbalized understanding of instructions, educational materials, and care plan provided today and declined offer to receive copy of patient instructions, educational materials, and care plan.  The pharmacy team will reach out to the patient again over the next 90 days.   NatLane HackerPHChi Health Lakeside

## 2021-08-08 ENCOUNTER — Other Ambulatory Visit: Payer: Self-pay

## 2021-08-08 ENCOUNTER — Encounter: Payer: Self-pay | Admitting: Cardiology

## 2021-08-08 ENCOUNTER — Ambulatory Visit (INDEPENDENT_AMBULATORY_CARE_PROVIDER_SITE_OTHER): Payer: PPO | Admitting: Cardiology

## 2021-08-08 VITALS — BP 142/84 | HR 63 | Ht 68.0 in | Wt 172.0 lb

## 2021-08-08 DIAGNOSIS — Z01818 Encounter for other preprocedural examination: Secondary | ICD-10-CM

## 2021-08-08 DIAGNOSIS — I4819 Other persistent atrial fibrillation: Secondary | ICD-10-CM | POA: Diagnosis not present

## 2021-08-08 DIAGNOSIS — I10 Essential (primary) hypertension: Secondary | ICD-10-CM | POA: Diagnosis not present

## 2021-08-08 DIAGNOSIS — I483 Typical atrial flutter: Secondary | ICD-10-CM

## 2021-08-08 DIAGNOSIS — Z01812 Encounter for preprocedural laboratory examination: Secondary | ICD-10-CM

## 2021-08-08 DIAGNOSIS — I484 Atypical atrial flutter: Secondary | ICD-10-CM

## 2021-08-08 NOTE — Progress Notes (Signed)
Electrophysiology Office Note   Date:  08/08/2021   ID:  April Munoz, DOB [birth year], MRN [id number]  PCP:  [name], MD  Cardiologist: Bettina Gavia Primary Electrophysiologist:  Constance Haw, MD    No chief complaint on file.     History of Present Illness: April Munoz is a 71 y.o. female who is being seen today for the evaluation of atrial fibrillation at the request of Cox, Kirsten, MD. Presenting today for electrophysiology evaluation.  She has a history significant for persistent atrial fibrillation, depression, diabetes, hypertension, hyperlipidemia, TIA, typical atrial flutter.  She wore a 30-day monitor that showed entirely atrial fibrillation.  She is status post ablation 03/21/2019.  She then presented with typical atrial flutter and is status post ablation 05/29/2020.  She is unfortunately had left atrial flutters.  She is status post cardioversion 05/29/2021.  She is currently on flecainide.  Today, denies symptoms of palpitations, chest pain, shortness of breath, orthopnea, PND, lower extremity edema, claudication, dizziness, presyncope, syncope, bleeding, or neurologic sequela. The patient is tolerating medications without difficulties.  She presents today complaining of shortness of breath and fatigue as well as falls.  She is unclear as to why she is falling.  It happens when she turns around quickly.  She is currently going to some sort of therapy for this.  She has been short of breath and fatigue for the last few months.  Unfortunately she is in a left atrial flutter.  Past Medical History:  Diagnosis Date   Anxiety    Arthritis    Ataxic gait    Atrial fibrillation (HCC)    Atrophy of thyroid    B12 deficiency 10/31/2017   Cellulitis of abdominal wall 07/07/2016   Chronic anticoagulation 05/31/2018   Depression    Depression, major, recurrent, mild ([place]) 09/16/2019   Diabetes mellitus without complication ([place])    Diabetic glomerulopathy ([place]) 09/16/2019    Esophageal stricture 06/05/2017   Essential hypertension 02/24/2013   Fatigue 01/20/2020   Fibromyalgia 02/24/2013   Full dentures    Hereditary and idiopathic peripheral neuropathy 02/24/2013   High risk medication use 11/07/2017   Hyperlipidemia 02/24/2013   Hypertension    Hypertensive heart disease 02/24/2013   Incontinence    Insomnia    Longstanding persistent atrial fibrillation ([place]) 09/16/2019   Mild vitamin D deficiency 09/16/2019   Mixed hyperlipidemia 02/24/2013   Murmur, cardiac 08/15/2015   Neuropathy in diabetes ([place]) 10/31/2017   Osteoporosis    Other amnesia    Other amnesia    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    Paroxysmal atrial fibrillation ([place]) 08/15/2015   CHADS2vasc=3 CHADS2vasc=3   Primary insomnia    Restless legs syndrome 03/14/2013   Secondary hypothyroidism 09/16/2019   Thoracic or lumbosacral neuritis or radiculitis 02/24/2013   Type 2 diabetes mellitus, without long-term current use of insulin ([place]) 02/24/2013   Urge incontinence of urine 09/16/2019   Wears glasses    Past Surgical History:  Procedure Laterality Date   A-FLUTTER ABLATION N/A 07/30/2019   Procedure: A-FLUTTER ABLATION;  Surgeon: Constance Haw, MD;  Location: [facility];  Service: Cardiovascular;  Laterality: N/A;   ATRIAL FIBRILLATION ABLATION N/A 03/21/2019   Procedure: ATRIAL FIBRILLATION ABLATION;  Surgeon: Constance Haw, MD;  Location: [facility];  Service: Cardiovascular;  Laterality: N/A;   [place] STUDY  11/26/2020   Procedure: BUBBLE STUDY;  Surgeon: Pixie Casino, MD;  Location: [facility];  Service: Cardiovascular;;   CARDIOVERSION N/A 11/26/2020   Procedure: CARDIOVERSION;  Surgeon: Pixie Casino, MD;  Location: [facility];  Service: Cardiovascular;  Laterality: N/A;   CATARACT EXTRACTION, BILATERAL  2018, 2019   CHOLECYSTECTOMY  1971   open   DIAGNOSTIC LAPAROSCOPY  2010   lysis of  adhesions   DILATION AND CURETTAGE OF UTERUS  2004   KNEE ARTHROSCOPY  1998   left   KNEE ARTHROSCOPY W/ LATERAL RETINACULAR REPAIR     MASS EXCISION Left 11/11/2013   Procedure: EXCISION MUCOID CYST LEFT INDEX FINGER/DEBRIDEMENT LEFT INDEX FINGER;  Surgeon: Wynonia Sours, MD;  Location: [facility];  Service: Orthopedics;  Laterality: Left;  ANESTHESIA: IV REGIONAL/FAB   SHOULDER ARTHROSCOPY W/ ROTATOR CUFF REPAIR  2007   left   TEE WITHOUT CARDIOVERSION N/A 11/26/2020   Procedure: TRANSESOPHAGEAL ECHOCARDIOGRAM (TEE);  Surgeon: Pixie Casino, MD;  Location[facility];  Service: Cardiovascular;  Laterality: N/A;   TRIGGER FINGER RELEASE Left 11/11/2013   Procedure: RELEASE A-1 PULLEY LEFT RING FINGER;  Surgeon: Wynonia Sours, MD;  Location: [place];  Service: Orthopedics;  Laterality: Left;   UMBILICAL HERNIA REPAIR  2008, 2010     Current Outpatient Medications  Medication Sig Dispense Refill   acyclovir (ZOVIRAX) 800 MG tablet TAKE 1 TABLET BY MOUTH FOUR TIMES DAILY 120 tablet 0   atorvastatin (LIPITOR) 20 MG tablet Take 1 tablet (20 mg total) by mouth daily. 90 tablet 2   B Complex-C-Folic Acid TABS Take 1 tablet by mouth daily.     Calcium Carb-Cholecalciferol (LIQUID CALCIUM WITH D3) [phone] MG-UNIT CAPS Take 600 mg by mouth daily.     Calcium-Vitamin D-Vitamin K (VIACTIV CALCIUM PLUS D) 650-12.5-40 MG-MCG-MCG CHEW Chew 1 capsule by mouth daily.     Coenzyme Q10 100 MG TABS Take 1 tablet by mouth daily.     Continuous Blood Gluc Receiver (FREESTYLE LIBRE 2 READER) DEVI E11.21 Check blood sugar 4 times daily as directed 1 each 0   Continuous Blood Gluc Sensor (FREESTYLE LIBRE 2 SENSOR) MISC E11.21 Change sensor every 14 days as directed 6 each 3   cyanocobalamin (,VITAMIN B-12,) 1000 MCG/ML injection INJECT 1ML INTO THE MUSCLE EVERY 30 DAYS 1 mL 2   dapagliflozin propanediol (FARXIGA) 10 MG TABS tablet Take 10 mg by mouth daily.     Dulaglutide  (TRULICITY) 3.74 MO/7.0BE SOPN Inject 0.75 mg into the skin once a week. 6 mL 0   EPINEPHrine 0.3 mg/0.3 mL IJ SOAJ injection Inject 0.3 mg into the muscle as needed. 1 each 1   flecainide (TAMBOCOR) 100 MG tablet TAKE ONE (1) TABLET BY MOUTH TWO (2) TIMES DAILY 180 tablet 3   levothyroxine (SYNTHROID) 75 MCG tablet TAKE ONE (1) TABLET ONCE DAILY BEFORE BREAKFAST 90 tablet 1   losartan (COZAAR) 50 MG tablet Take 1 tablet (50 mg total) by mouth daily.     Lutein 20 MG TABS Take 1 tablet by mouth daily.     Melatonin 10 MG TABS Take 10 mg by mouth 4 (four) times a week.     metFORMIN (GLUCOPHAGE) 1000 MG tablet TAKE ONE TABLET TWICE DAILY WITH MEALS 180 tablet 0   metoprolol succinate (TOPROL-XL) 50 MG 24 hr tablet Take 1 tablet (50 mg total) by mouth at bedtime.     Multiple Vitamin (MULTIVITAMIN WITH MINERALS) TABS tablet Take 1 tablet by mouth daily.     OLANZapine (ZYPREXA) 2.5 MG tablet  Take 1 tablet (2.5 mg total) by mouth at bedtime. 30 tablet 2   omega-3 acid ethyl esters (LOVAZA) 1 g capsule Take 2 capsules by mouth 2 (two) times daily.     omeprazole (PRILOSEC) 20 MG capsule TAKE ONE (1) CAPSULE EACH DAY 90 capsule 0   Probiotic Product (PROBIOTIC & ACIDOPHILUS EX ST PO) Take 1 tablet by mouth daily.      solifenacin (VESICARE) 5 MG tablet Take 1 tablet (5 mg total) by mouth daily. 30 tablet 2   torsemide (DEMADEX) 20 MG tablet Take 1 tablet (20 mg total) by mouth 3 (three) times a week. Please take this on Monday, Wednesday, and Friday. 90 tablet 0   venlafaxine XR (EFFEXOR XR) 150 MG 24 hr capsule Take 1 capsule (150 mg total) by mouth daily with breakfast. 90 capsule 1   venlafaxine XR (EFFEXOR-XR) 75 MG 24 hr capsule TAKE 1 CAPSULE DAILY WITH BREAKFAST FOR 7 DAYS; THEN 2 CAPSULES DAILY WITH BREAKFAST FOR 23 DAYS. 53 capsule 0   Vitamin D, Ergocalciferol, (DRISDOL) 1.25 MG (50000 UNIT) CAPS capsule Take 1 capsule (50,000 Units total) by mouth every 7 (seven) days. 5 capsule 5   XARELTO  20 MG TABS tablet TAKE ONE (1) TABLET ONCE DAILY WITH SUPPER 30 tablet 3   Zinc Sulfate (ZINC 15 PO) Take 150 mg by mouth 3 (three) times a week.     No current facility-administered medications for this visit.    Allergies:   Patient has no active allergies.   Social History:  The patient  reports that she has never smoked. She has never used smokeless tobacco. She reports current alcohol use of about 3.0 - 4.0 standard drinks per week. She reports that she does not use drugs.   Family History:  The patient's family history includes Atrial fibrillation in her mother; Diabetes in her maternal grandmother; Heart attack in her brother and mother; Heart disease in her brother; Heart failure in her father and paternal grandfather; Stroke in her paternal grandfather.   ROS:  Please see the history of present illness.   Otherwise, review of systems is positive for none.   All other systems are reviewed and negative.   PHYSICAL EXAM: VS:  BP (!) 142/84    Pulse 63    Ht 5\' 8"  (1.727 m)    Wt 172 lb (78 kg)    SpO2 97%    BMI 26.15 kg/m  , BMI Body mass index is 26.15 kg/m. GEN: Well nourished, well developed, in no acute distress  HEENT: normal  Neck: no JVD, carotid bruits, or masses Cardiac: RRR; no murmurs, rubs, or gallops,no edema  Respiratory:  clear to auscultation bilaterally, normal work of breathing GI: soft, nontender, nondistended, + BS MS: no deformity or atrophy  Skin: warm and dry Neuro:  Strength and sensation are intact Psych: euthymic mood, full affect  EKG:  EKG is ordered today. Personal review of the ekg ordered shows atrial flutter, rate 63  Recent Labs: 06/15/2021: ALT 24; BUN 15; Creatinine, Ser 1.06; Hemoglobin 15.4; Platelets 412; Potassium 4.4; Sodium 145; TSH 4.170    Lipid Panel     Component Value Date/Time   CHOL 219 (H) 06/15/2021 1019   TRIG 168 (H) 06/15/2021 1019   HDL 75 06/15/2021 1019   CHOLHDL 2.9 06/15/2021 1019   LDLCALC 115 (H) 06/15/2021  1019     Wt Readings from Last 3 Encounters:  08/08/21 172 lb (78 kg)  06/19/21 171 lb (77.6  kg)  04/18/21 183 lb (83 kg)      Other studies Reviewed: Additional studies/ records that were reviewed today include: Monitor 01/29/2019 personally reviewed Review of the above records today demonstrates:  1. atrial fibrillation with good heart rate control 2.  Rare ventricular ectopy  TTE 11/08/2020 Ejection fraction 55 to 60% Normal right ventricular size and function Left atrium normal in size Right atrium normal in size Aortic valve trileaflet structurally normal  Normal-appearing mitral valve  Cardiac monitor 05/09/2021 personally reviewed Predominant underlying rhythm was sinus rhythm Less than 1% ventricular and supraventricular ectopy Triggered event associated with sinus rhythm  ASSESSMENT AND PLAN:  1.  Persistent atrial fibrillation/atypical atrial flutter: Currently on regular flecainide 100 mg twice daily, Toprol-XL 50 mg daily, Xarelto 20 mg daily.  High risk medication monitoring.  Status post ablation 03/21/2019.  CHA2DS2-VASc of 6.  She had a left atrial flutter and is now status post cardioversion May 2022.  Unfortunately she is back in a left atrial flutter.  She would like to avoid further medications.  We Nayden Czajka plan for a repeat ablation.  She is back in atrial flutter, Patryce Depriest stop her flecainide.  Risk, benefits, and alternatives to EP study and radiofrequency ablation for afib were also discussed in detail today. These risks include but are not limited to stroke, bleeding, vascular damage, tamponade, perforation, damage to the esophagus, lungs, and other structures, pulmonary vein stenosis, worsening renal function, and death. The patient understands these risk and wishes to proceed.  We Idaliz Tinkle therefore proceed with catheter ablation at the next available time.  Carto, ICE, anesthesia are requested for the procedure.  Basem Yannuzzi also obtain CT PV protocol prior to the procedure to  exclude LAA thrombus and further evaluate atrial anatomy.  2.  Hypertension: Mildly elevated today.  Usually well controlled.  No changes.  3.  Typical atrial flutter: Status post ablation 05/29/2020.  No obvious recurrence.  Current medicines are reviewed at length with the patient today.   The patient does not have concerns regarding her medicines.  The following changes were made today: Stop flecainide  Labs/ tests ordered today include:  No orders of the defined types were placed in this encounter.     Disposition:   FU with Vail Basista 3 months  Signed, Tedric Leeth Meredith Leeds, MD  08/08/2021 10:03 AM     [facility] [address] [place] [address] [phone] (office) [phone] (fax)

## 2021-08-08 NOTE — Patient Instructions (Signed)
Medication Instructions:  Your physician has recommended you make the following change in your medication:  STOP Flecainide  *If you need a refill on your cardiac medications before your next appointment, please call your pharmacy*   Lab Work: Pre procedure labs between 10/03/21 - 10/14/21 :  BMP & CBC You can stop by the [facility] office for this.  You do NOT need to be fasting.  If you have labs (blood work) drawn today and your tests are completely normal, you will receive your results only by: [place] (if you have MyChart) OR A paper copy in the mail If you have any lab test that is abnormal or we need to change your treatment, we will call you to review the results.   Testing/Procedures: Your physician has requested that you have cardiac CT within 7 days PRIOR to your ablation. Cardiac computed tomography (CT) is a painless test that uses an x-ray machine to take clear, detailed pictures of your heart.  Please follow instruction below located under "other instructions". You will get a call from our office to schedule the date for this test.  Your physician has recommended that you have an ablation. Catheter ablation is a medical procedure used to treat some cardiac arrhythmias (irregular heartbeats). During catheter ablation, a long, thin, flexible tube is put into a blood vessel in your groin (upper thigh), or neck. This tube is called an ablation catheter. It is then guided to your heart through the blood vessel. Radio frequency waves destroy small areas of heart tissue where abnormal heartbeats may cause an arrhythmia to start. Please follow instruction below located under "other instructions".   Follow-Up: At [facility], you and your health needs are our priority.  As part of our continuing mission to provide you with exceptional heart care, we have created designated Provider Care Teams.  These Care Teams include your primary Cardiologist (physician) and Advanced Practice  Providers (APPs -  Physician Assistants and Nurse Practitioners) who all work together to provide you with the care you need, when you need it.  We recommend signing up for the patient portal called "MyChart".  Sign up information is provided on this After Visit Summary.  MyChart is used to connect with patients for Virtual Visits (Telemedicine).  Patients are able to view lab/test results, encounter notes, upcoming appointments, etc.  Non-urgent messages can be sent to your provider as well.   To learn more about what you can do with MyChart, go to [email or web address].    Your next appointment:   1 month(s) after your ablation  The format for your next appointment:   In Person  Provider:   AFib clinic   Thank you for choosing [facility]!!   Trinidad Curet, RN [phone]    Other Instructions   CT INSTRUCTIONS Your cardiac CT will be scheduled at:  [facility] [address] [place], [place] [address] [phone]  [facility] 30 minutes prior to test start time. Proceed to the [facility] Radiology Department (first floor) to check-in and test prep.   Please follow these instructions carefully (unless otherwise directed):  On the Night Before the Test: Be sure to Drink plenty of water. Do not consume any caffeinated/decaffeinated beverages or chocolate 12 hours prior to your test. Do not take any antihistamines 12 hours prior to your test.  On the Day of the Test: Drink plenty of water until 1 hour prior to  the test. Do not eat any food 4 hours prior to the test. You may take your regular medications prior to the test.  HOLD Furosemide/Hydrochlorothiazide/Torsemide morning of the test. FEMALES- please wear underwire-free bra if available      After the Test: Drink plenty of water. After receiving IV contrast, you may experience a mild flushed feeling. This is normal. On occasion,  you may experience a mild rash up to 24 hours after the test. This is not dangerous. If this occurs, you can take Benadryl 25 mg and increase your fluid intake. If you experience trouble breathing, this can be serious. If it is severe call 911 IMMEDIATELY. If it is mild, please call our office. If you take any of these medications: Glipizide/Metformin, Avandament, Glucavance, please do not take 48 hours after completing test unless otherwise instructed.   Once we have confirmed authorization from your insurance company, we will call you to set up a date and time for your test. Based on how quickly your insurance processes prior authorizations requests, please allow up to 4 weeks to be contacted for scheduling your Cardiac CT appointment. Be advised that routine Cardiac CT appointments could be scheduled as many as 8 weeks after your provider has ordered it.  For non-scheduling related questions, please contact the cardiac imaging nurse navigator should you have any questions/concerns: Marchia Bond, Cardiac Imaging Nurse Navigator Gordy Clement, Cardiac Imaging Nurse Navigator [facility] Direct Office Dial: [phone]   For scheduling needs, including cancellations and rescheduling, please call [place], [phone].      Electrophysiology/Ablation Procedure Instructions   You are scheduled for a(n)  ablation on 10/27/2021 with Dr. Allegra Lai.   1.   Pre procedure testing-             A.  LAB WORK ---  between 10/03/21 - 10/14/21 :  BMP & CBC You can stop by the [facility] office for this.  You do NOT need to be fasting.   On the day of your procedure 10/27/2021 you will go to [facility] [phone] [address]) at 8:30 am.  Dennis Bast will go to the main entrance A [employer]) and enter where the [employer] are.  Your driver will drop you off and you will head down the hallway to ADMITTING.  You may have one support person come in to the hospital with  you.  They will be asked to wait in the waiting room. It is OK to have someone drop you off and come back when you are ready to be discharged.   3.   Do not eat or drink after midnight prior to your procedure.   4.   On the morning of your procedure do NOT take any medication. Do not miss any doses of your blood thinner prior to the morning of your procedure or your procedure will need to be rescheduled.   5.  Plan for an overnight stay but you may be discharged after your procedure, if you use your phone frequently bring your phone charger. If you are discharged after your procedure you will need someone to drive you home and be with you for 24 hours after your procedure.   6. You will follow up with the AFIB clinic 4 weeks after your procedure.  You will follow up with Dr. Curt Bears  3 months after your procedure.  These appointments will be made for you.   7. FYI: For your safety, and to allow  [place] to monitor your vital signs accurately during the surgery/procedure we request that if you have artificial nails, gel coating, SNS etc. Please have those removed prior to your surgery/procedure. Not having the nail coverings /polish removed may result in cancellation or delay of your surgery/procedure.  * If you have ANY questions please call the office [phone] and ask for Leyli Kevorkian RN or send me a MyChart message   * Occasionally, EP Studies and ablations can become lengthy.  Please make your family aware of this before your procedure starts.  Average time ranges from 2-8 hours for EP studies/ablations.  Your physician will call your family after the procedure with the results.                                     Cardiac Ablation Cardiac ablation is a procedure to destroy (ablate) some heart tissue that is sending bad signals. These bad signals cause problems in heart rhythm. The heart has many areas that make these signals. If there are problems in these areas, they can make the heart beat in a  way that is not normal. Destroying some tissues can help make the heart rhythm normal. Tell your doctor about: Any allergies you have. All medicines you are taking. These include vitamins, herbs, eye drops, creams, and over-the-counter medicines. Any problems you or family members have had with medicines that make you fall asleep (anesthetics). Any blood disorders you have. Any surgeries you have had. Any medical conditions you have, such as kidney failure. Whether you are pregnant or may be pregnant. What are the risks? This is a safe procedure. But problems may occur, including: Infection. Bruising and bleeding. Bleeding into the chest. Stroke or blood clots. Damage to nearby areas of your body. Allergies to medicines or dyes. The need for a pacemaker if the normal system is damaged. Failure of the procedure to treat the problem. What happens before the procedure? Medicines Ask your doctor about: Changing or stopping your normal medicines. This is important. Taking aspirin and ibuprofen. Do not take these medicines unless your doctor tells you to take them. Taking other medicines, vitamins, herbs, and supplements. General instructions Follow instructions from your doctor about what you cannot eat or drink. Plan to have someone take you home from the hospital or clinic. If you will be going home right after the procedure, plan to have someone with you for 24 hours. Ask your doctor what steps will be taken to prevent infection. What happens during the procedure?  An IV tube will be put into one of your veins. You will be given a medicine to help you relax. The skin on your neck or groin will be numbed. A cut (incision) will be made in your neck or groin. A needle will be put through your cut and into a large vein. A tube (catheter) will be put into the needle. The tube will be moved to your heart. Dye may be put through the tube. This helps your doctor see your heart. Small  devices (electrodes) on the tube will send out signals. A type of energy will be used to destroy some heart tissue. The tube will be taken out. Pressure will be held on your cut. This helps stop bleeding. A bandage will be put over your cut. The exact procedure may vary among doctors and hospitals. What happens after the procedure? You will be watched until you  leave the hospital or clinic. This includes checking your heart rate, breathing rate, oxygen, and blood pressure. Your cut will be watched for bleeding. You will need to lie still for a few hours. Do not drive for 24 hours or as long as your doctor tells you. Summary Cardiac ablation is a procedure to destroy some heart tissue. This is done to treat heart rhythm problems. Tell your doctor about any medical conditions you may have. Tell him or her about all medicines you are taking to treat them. This is a safe procedure. But problems may occur. These include infection, bruising, bleeding, and damage to nearby areas of your body. Follow what your doctor tells you about food and drink. You may also be told to change or stop some of your medicines. After the procedure, do not drive for 24 hours or as long as your doctor tells you. This information is not intended to replace advice given to you by your health care provider. Make sure you discuss any questions you have with your health care provider. Document Revised: 06/05/2019 Document Reviewed: 06/05/2019 Elsevier Patient Education  2022 [employer].

## 2021-08-15 ENCOUNTER — Institutional Professional Consult (permissible substitution): Payer: Medicare Other | Admitting: Neurology

## 2021-08-16 DIAGNOSIS — I110 Hypertensive heart disease with heart failure: Secondary | ICD-10-CM

## 2021-08-16 DIAGNOSIS — I4811 Longstanding persistent atrial fibrillation: Secondary | ICD-10-CM | POA: Diagnosis not present

## 2021-08-16 DIAGNOSIS — E1121 Type 2 diabetes mellitus with diabetic nephropathy: Secondary | ICD-10-CM

## 2021-08-23 ENCOUNTER — Other Ambulatory Visit: Payer: Self-pay | Admitting: Family Medicine

## 2021-08-23 DIAGNOSIS — N3941 Urge incontinence: Secondary | ICD-10-CM

## 2021-08-29 ENCOUNTER — Encounter: Payer: Self-pay | Admitting: Neurology

## 2021-08-29 ENCOUNTER — Ambulatory Visit (INDEPENDENT_AMBULATORY_CARE_PROVIDER_SITE_OTHER): Payer: PPO | Admitting: Neurology

## 2021-08-29 VITALS — BP 162/90 | HR 63 | Ht 68.0 in | Wt 176.0 lb

## 2021-08-29 DIAGNOSIS — G4721 Circadian rhythm sleep disorder, delayed sleep phase type: Secondary | ICD-10-CM | POA: Diagnosis not present

## 2021-08-29 DIAGNOSIS — F5101 Primary insomnia: Secondary | ICD-10-CM

## 2021-08-29 DIAGNOSIS — F418 Other specified anxiety disorders: Secondary | ICD-10-CM

## 2021-08-29 DIAGNOSIS — I4819 Other persistent atrial fibrillation: Secondary | ICD-10-CM

## 2021-08-29 NOTE — Progress Notes (Signed)
SLEEP MEDICINE CLINIC    Provider:  Larey Seat, MD  Primary Care Physician:  [name], MD [address] [place] [address]     Referring Provider: [name], Md [address] [place],  [place] [address]          Chief Complaint according to patient   Patient presents with:     New Patient (Initial Visit)      Primary Neurologist dr Leta Baptist, MD.       HISTORY OF PRESENT ILLNESS:  April Munoz is a 71 y.o. Caucasian female patient seen here upon referral on 08/29/2021 from PCP  for an amnestic event, triggered by Ambien. Life long problems with insomnia.  Primary patient of Dr. Leta Baptist, MD , who saw her for a TIA less than 3 years ago.  Chief concern according to patient :  I had been on Ambien about 18 years ago but at that time.  Developed black out- amnestic spells. She had falls and cannot remember how and why. Had conversations, watched TV shows she cannot remember.  This let to d/c of meds and now she tried it again with the same result.  She is unable to sleep before 2-4 Am, and often has nights of no sleep at all, 1-2 nights, then followed by a night of  sleep onset early at 10 Pm, and sleeping through the night. This was her cyclic pattern while working. Now retied she want to sleep all day and night once asleep.     April Munoz  was seen in 2020 by Dr Leta Baptist, already reporting the same symptoms: "71 year old female here for evaluation of memory loss, balance difficulty, falls, primary insomnia, racing thoughts, anxiety. Patient has hypertension, diabetes, hypercholesterolemia, atrial fibrillation, depression, anxiety, fibromyalgia and was diagnosed with atrial fibrillation 3 years ago.  She was recommended to start anticoagulation but she has not been able to due to financial limitations.  She does not want to take Coumadin.  She was prescribed Eliquis but cannot afford it.   Over the past 1 year she has had at least 10-12 episodes of  transient slurred speech and trouble talking.  Sometimes these are associated with staggering gait.  Episodes last for few minutes at a time.  She has not gone to the emergency room or had evaluation of these in the past. Patient also has a gradual onset and progressive generalized balance difficulty, with numbness in her feet.  She feels like her toes are weak.  She has been diagnosed with diabetic peripheral neuropathy.  She has numbness and tingling in her feet.  She has been on gabapentin in the past." Also with chronic anxiety and insomnia since childhood.  She has seen a therapist many years ago but not currently.  She is tried many sleep aids without benefit."  April Munoz has a  has a past medical history of Anxiety, Arthritis, Ataxic gait, Atrial fibrillation ([place]), Atrophy of thyroid, B12 deficiency (10/31/2017), Cellulitis of abdominal wall (07/07/2016), Chronic anticoagulation (05/31/2018), Depression, Depression, major, recurrent, mild ([place]) (09/16/2019), Diabetes mellitus without complication ([place]), Diabetic glomerulopathy ([place]) (09/16/2019), Esophageal stricture (06/05/2017), Essential hypertension (02/24/2013), Full dentures, High risk medication use (11/07/2017), Hypertensive heart disease (02/24/2013), Incontinence, primary Insomnia, Longstanding persistent atrial fibrillation ([place]) (09/16/2019), Mild vitamin D deficiency (09/16/2019), Mixed hyperlipidemia (02/24/2013), Murmur, cardiac (08/15/2015), Neuropathy / diabetes ([place]) (10/31/2017), Osteoporosis, 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     The patient had no sleep study. She cannot go to sleep- and has no trouble to stay asleep once asleep.   This is most likely related to a psychological cause. She has been reporting her mind is racing, she gets more active as the evening approaches. Reading is stimulating.  She reports feeling tired and yet being unable to sleep- she stays at home, not going out much.    Sleep relevant medical history: no Nocturia, Sleep walking on ambien , medication induced amnesia Parasomnia. Sleep eating, sleep talking.     Family medical /sleep history: no other family member on CPAP with OSA, insomnia, sleep walkers.    Social history:  Patient is retired from Radiation protection practitioner in [employer],  and lives in a household with spouse,  2 adult children. Grandson lives with them, with 2 great-grand children.  Tobacco use; never .  ETOH use - some times a night cap- Caffeine intake in form of Coffee( in AM ). Regular exercise in form of walking.      Sleep habits are as follows: The patient's dinner time is between 6 PM. The patient goes to bed at 11 PM and continues to struggle to sleep for several  hours. Husband sleeps in a separate bedroom-  The preferred sleep position is on her side but restless- with the support of 2-4 pillows.  Dreams are reportedly frequent/vivid.  7  AM is the usual rise time. Husband goes to work, gets up at 7 AM - she will sleep again until lunch time-  up to 2 PM.  She reports not feeling refreshed or restored in AM, with symptoms such as dry mouth, morning headaches, and residual fatigue. Headaches at night-  Naps are taken infrequently, sleeps better with a humidifier.    Review of Systems: Out of a complete 14 system review, the patient complains of only the following symptoms, and all other reviewed systems are negative.:  Fatigue, sleepiness , snoring, fragmented sleep, Insomnia - sleeping in daytime and waking at night.   Atrial fib , fatigue, not palpitations.    How likely are you to doze in the following situations: 0 = not likely, 1 = slight chance, 2 =  moderate chance, 3 = high chance   Sitting and Reading? Watching Television? Sitting inactive in a public place (theater or meeting)? As a passenger in a car for an hour without a break? Lying down in the afternoon when circumstances permit? Sitting and talking to someone? Sitting quietly after lunch without alcohol? In a car, while stopped for a few minutes in traffic?   Total = 6/ 24 points   FSS endorsed at 58/ 63 points.     Social History   Socioeconomic History   Marital status: Married    Spouse name: April Munoz   Number of children: 2   Years of education: Not on file   Highest education level: Some college, no degree  Occupational History   Occupation: retired  Tobacco Use   Smoking status: Never   Smokeless tobacco: Never  Vaping Use   Vaping Use: Never used  Substance and Sexual Activity   Alcohol use: Yes    Alcohol/week: 3.0 - 4.0 standard drinks    Types: 1 - 2 Glasses of wine, 2 Shots of liquor per week  Comment: 4 times a week   Drug use: No   Sexual activity: Not on file  Other Topics Concern   Not on file  Social History Narrative   Lives at home with husband and grandson and his fiance and grand baby   Left handed   Caffeine: caffeine free drinks. 2 cup of coffee a day   Social Determinants of Health   Financial Resource Strain: High Risk   Difficulty of Paying Living Expenses: Hard  Food Insecurity: No Food Insecurity   Worried About Charity fundraiser in the Last Year: Never true   Ran Out of Food in the Last Year: Never true  Transportation Needs: No Transportation Needs   Lack of Transportation (Medical): No   Lack of Transportation (Non-Medical): No  Physical Activity: Not on file  Stress: Not on file  Social Connections: Not on file    Family History  Problem Relation Age of Onset   Heart failure Father    Heart disease Brother    Heart attack Brother    Heart failure Paternal Grandfather    Stroke Paternal Grandfather    Atrial  fibrillation Mother    Heart attack Mother    Diabetes Maternal Grandmother     Past Medical History:  Diagnosis Date   Anxiety    Arthritis    Ataxic gait    Atrial fibrillation ([place])    Atrophy of thyroid    B12 deficiency 10/31/2017   Cellulitis of abdominal wall 07/07/2016   Chronic anticoagulation 05/31/2018   Depression    Depression, major, recurrent, mild ([place]) 09/16/2019   Diabetes mellitus without complication ([place])    Diabetic glomerulopathy ([place]) 09/16/2019   Esophageal stricture 06/05/2017   Essential hypertension 02/24/2013   Fatigue 01/20/2020   Fibromyalgia 02/24/2013   Full dentures    Hereditary and idiopathic peripheral neuropathy 02/24/2013   High risk medication use 11/07/2017   Hyperlipidemia 02/24/2013   Hypertension    Hypertensive heart disease 02/24/2013   Incontinence    Insomnia    Longstanding persistent atrial fibrillation ([place]) 09/16/2019   Mild vitamin D deficiency 09/16/2019   Mixed hyperlipidemia 02/24/2013   Murmur, cardiac 08/15/2015   Neuropathy in diabetes ([place]) 10/31/2017   Osteoporosis    Other amnesia    Other amnesia    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    Paroxysmal atrial fibrillation ([place]) 08/15/2015   CHADS2vasc=3 CHADS2vasc=3   Primary insomnia    Restless legs syndrome 03/14/2013   Secondary hypothyroidism 09/16/2019   Thoracic or lumbosacral neuritis or radiculitis 02/24/2013   Type 2 diabetes mellitus, without long-term current use of insulin ([place]) 02/24/2013   Urge incontinence of urine 09/16/2019   Wears glasses     Past Surgical History:  Procedure Laterality Date   A-FLUTTER ABLATION N/A 07/30/2019   Procedure: A-FLUTTER ABLATION;  Surgeon: Constance Haw, MD;  Location: [facility];  Service: Cardiovascular;  Laterality: N/A;   ATRIAL FIBRILLATION ABLATION N/A 03/21/2019   Procedure: ATRIAL FIBRILLATION ABLATION;  Surgeon: Constance Haw, MD;  Location: [facility];  Service:  Cardiovascular;  Laterality: N/A;   [place] STUDY  11/26/2020   Procedure: BUBBLE STUDY;  Surgeon: Pixie Casino, MD;  Location: [facility];  Service: Cardiovascular;;   CARDIOVERSION N/A 11/26/2020   Procedure: CARDIOVERSION;  Surgeon: Pixie Casino, MD;  Location: [facility];  Service: Cardiovascular;  Laterality: N/A;   CATARACT EXTRACTION, BILATERAL  2018, 2019   CHOLECYSTECTOMY  1971   open   DIAGNOSTIC LAPAROSCOPY  2010   lysis of adhesions   DILATION AND CURETTAGE OF UTERUS  2004   KNEE ARTHROSCOPY  1998   left   KNEE ARTHROSCOPY W/ LATERAL RETINACULAR REPAIR     MASS EXCISION Left 11/11/2013   Procedure: EXCISION MUCOID CYST LEFT INDEX FINGER/DEBRIDEMENT LEFT INDEX FINGER;  Surgeon: Wynonia Sours, MD;  Location: [facility];  Service: Orthopedics;  Laterality: Left;  ANESTHESIA: IV REGIONAL/FAB   SHOULDER ARTHROSCOPY W/ ROTATOR CUFF REPAIR  2007   left   TEE WITHOUT CARDIOVERSION N/A 11/26/2020   Procedure: TRANSESOPHAGEAL ECHOCARDIOGRAM (TEE);  Surgeon: Pixie Casino, MD;  [facility];  Service: Cardiovascular;  Laterality: N/A;   TRIGGER FINGER RELEASE Left 11/11/2013   Procedure: RELEASE A-1 PULLEY LEFT RING FINGER;  Surgeon: Wynonia Sours, MD;  Location: [facility];  Service: Orthopedics;  Laterality: Left;   UMBILICAL HERNIA REPAIR  2008, 2010     Current Outpatient Medications on File Prior to Visit  Medication Sig Dispense Refill   acyclovir (ZOVIRAX) 800 MG tablet TAKE 1 TABLET BY MOUTH FOUR TIMES A DAY 120 tablet 0   atorvastatin (LIPITOR) 20 MG tablet Take 1 tablet (20 mg total) by mouth daily. 90 tablet 2   B Complex-C-Folic Acid TABS Take 1 tablet by mouth daily.     Coenzyme Q10 100 MG TABS Take 1 tablet by mouth daily.     Continuous Blood Gluc Receiver (FREESTYLE LIBRE 2 READER) DEVI E11.21 Check blood sugar 4 times daily as directed 1 each 0   Continuous Blood Gluc Sensor (FREESTYLE  LIBRE 2 SENSOR) MISC E11.21 Change sensor every 14 days as directed 6 each 3   cyanocobalamin (,VITAMIN B-12,) 1000 MCG/ML injection INJECT 1ML INTO THE MUSCLE EVERY 30 DAYS 1 mL 2   dapagliflozin propanediol (FARXIGA) 10 MG TABS tablet Take 10 mg by mouth daily.     Dulaglutide (TRULICITY) 8.27 MB/8.6LJ SOPN Inject 0.75 mg into the skin once a week. 6 mL 0   EPINEPHrine 0.3 mg/0.3 mL IJ SOAJ injection Inject 0.3 mg into the muscle as needed. 1 each 1   metFORMIN (GLUCOPHAGE) 1000 MG tablet TAKE ONE TABLET TWICE DAILY WITH MEALS 180 tablet 0   metoprolol succinate (TOPROL-XL) 50 MG 24 hr tablet Take 1 tablet (50 mg total) by mouth at bedtime.     Multiple Vitamin (MULTIVITAMIN WITH MINERALS) TABS tablet Take 1 tablet by mouth daily.     omega-3 acid ethyl esters (LOVAZA) 1 g capsule Take 2 capsules by mouth 2 (two) times daily.     omeprazole (PRILOSEC) 20 MG capsule TAKE ONE (1) CAPSULE EACH DAY 90 capsule 0   Probiotic Product (PROBIOTIC & ACIDOPHILUS EX ST PO) Take 1 tablet by mouth daily.      solifenacin (VESICARE) 5 MG tablet TAKE 1 TABLET BY MOUTH EVERY DAY 30 tablet 2   torsemide (DEMADEX) 20 MG tablet Take 1 tablet (20 mg total) by mouth 3 (three) times a week. Please take this on Monday, Wednesday, and Friday. 90 tablet 0   venlafaxine XR (EFFEXOR-XR) 75 MG 24 hr capsule TAKE 1 CAPSULE DAILY WITH BREAKFAST FOR 7 DAYS; THEN 2 CAPSULES DAILY WITH BREAKFAST FOR 23 DAYS. 53 capsule 0   Vitamin D, Ergocalciferol, (DRISDOL) 1.25 MG (50000 UNIT) CAPS capsule Take 1 capsule (50,000 Units total) by mouth every 7 (seven) days. 5 capsule 5   XARELTO 20  MG TABS tablet TAKE ONE (1) TABLET ONCE DAILY WITH SUPPER 30 tablet 3   Zinc Sulfate (ZINC 15 PO) Take 150 mg by mouth 3 (three) times a week.     No current facility-administered medications on file prior to visit.    No Active Allergies  Physical exam:  Today's Vitals   08/29/21 1246  BP: (!) 162/90  Pulse: 63  Weight: 176 lb (79.8 kg)   Height: 5\' 8"  (1.727 m)   Body mass index is 26.76 kg/m.   Wt Readings from Last 3 Encounters:  08/29/21 176 lb (79.8 kg)  08/08/21 172 lb (78 kg)  06/19/21 171 lb (77.6 kg)     Ht Readings from Last 3 Encounters:  08/29/21 5\' 8"  (1.727 m)  08/08/21 5\' 8"  (1.727 m)  06/19/21 5\' 8"  (1.727 m)      General: The patient is awake, alert and appears worried and talkative, not in acute distress. The patient is well groomed. Head: Normocephalic, atraumatic. Neck is supple.  Mallampati 2-3,  neck circumference:15 inches . Nasal airflow  patent.  Retrognathia is not seen.  Dental status: dentures Cardiovascular:  irregular rate and cardiac rhythm by pulse,   without distended neck veins. Respiratory: Lungs are clear to auscultation.  Skin:  Without evidence of ankle edema, or rash. Trunk: The patient's posture is erect.   Neurologic exam : The patient is awake and alert, oriented to place and time.   Memory subjective described as intact.  Attention span & concentration ability appears normal.  Speech is fluent,  without  dysarthria, dysphonia or aphasia.  Mood and affect are appropriate.   Cranial nerves: no loss of smell or taste reported  Pupils are equal and briskly reactive to light. Funduscopic exam deferred. .  Extraocular movements in vertical and horizontal planes were intact and without nystagmus. No Diplopia. Visual fields by finger perimetry are intact. Hearing was intact to soft voice and finger rubbing.    Facial sensation intact to fine touch.  Facial motor strength is symmetric and tongue and uvula move midline.  Neck ROM : rotation, tilt and flexion extension were normal for age and shoulder shrug was symmetrical.    Motor exam:  Symmetric bulk, tone and ROM.  loss of core strength.  Normal tone without cog -wheeling, symmetric grip strength.   Sensory:  Fine touch, pinprick and vibration were not felt to above knee line-  Proprioception tested in the upper  extremities was normal.   Coordination: Rapid alternating movements in the fingers/hands were of normal speed.  The Finger-to-nose maneuver was intact without evidence of ataxia, dysmetria.  There is a bilateral hand tremor with action and mildly at rest.    Gait and station: Patient could rise unassisted from a seated position, walked without assistive device.  Stance is of normal width/ base and the patient turned with 4 steps.  Toe and heel walk were deferred.  Deep tendon reflexes: in the  upper and lower extremities are absent  Babinski response was deferred .       After spending a total time of  45  minutes face to face and additional time for physical and neurologic examination, review of laboratory studies,  personal review of imaging studies, reports and results of other testing and review of referral information / records as far as provided in visit, I have established the following assessments:  1)  Most likely a non -organic insomnia, life long- initiation of sleep has been her problem.  She reports a 26 hours day , delayed sleep onset until she would have no sleep for 2-3 days , then restating the sleep time. 2) highly anxious, high heart rate , and racing thoughts.  3) history of atrial fib,  of TIA and of neuropathy with falls- see Dr Gladstone Lighter notes.    My Plan is to proceed with:  1) Sleep psychologist referral needed.  2) sleep hygiene - set a rise time and do not nap, go to bed when you are really ready to sleep. Skip a night of sleep if needed, and reset your clock. Melatonin 5 mg at intended bedtime.  3) PSG to rule out other sleep disorders, in light of atrial fibrillation, and considering her edentulous state she can be a OSA candidate.   I would like to thank [name], MD , [address] [place],  [place] [address] for allowing me to meet with and to take care of this pleasant patient.   In short, Cherika Jessie is presenting with primary Insomnia-  and Ambien induced amnesia.her primary Neurological problems have been evaluated by dr Leta Baptist less than 3 years ago and she would still be an active patient of his.   I plan to follow up either personally or through our NP within 2-4 month.   CC: I will share my notes with PCP and Dr Leta Baptist.   Electronically signed by: Larey Seat, MD 08/29/2021 1:03 PM  [facility] Board certified by The [employer] of Sleep Medicine and Diplomate of the [employer] of Sleep Medicine. Board certified In Neurology through the [place], Fellow of the [employer] of Neurology. Medical Director of [employer].

## 2021-08-29 NOTE — Patient Instructions (Addendum)
delayed sleep phase syndrome. Primary insomnia.   Insomnia Insomnia is a sleep disorder that makes it difficult to fall asleep or stay asleep. Insomnia can cause fatigue, low energy, difficulty concentrating, mood swings, and poor performance at work or school. There are three different ways to classify insomnia: Difficulty falling asleep. Difficulty staying asleep. Waking up too early in the morning. Any type of insomnia can be long-term (chronic) or short-term (acute). Both are common. Short-term insomnia usually lasts for three months or less. Chronic insomnia occurs at least three times a week for longer than three months. What are the causes? Insomnia may be caused by another condition, situation, or substance, such as: Anxiety. Certain medicines. Gastroesophageal reflux disease (GERD) or other gastrointestinal conditions. Asthma or other breathing conditions. Restless legs syndrome, sleep apnea, or other sleep disorders. Chronic pain. Menopause. Stroke. Abuse of alcohol, tobacco, or illegal drugs. Mental health conditions, such as depression. Caffeine. Neurological disorders, such as Alzheimer's disease. An overactive thyroid (hyperthyroidism). Sometimes, the cause of insomnia may not be known. What increases the risk? Risk factors for insomnia include: Gender. Women are affected more often than men. Age. Insomnia is more common as you get older. Stress. Lack of exercise. Irregular work schedule or working night shifts. Traveling between different time zones. Certain medical and mental health conditions. What are the signs or symptoms? If you have insomnia, the main symptom is having trouble falling asleep or having trouble staying asleep. This may lead to other symptoms, such as: Feeling fatigued or having low energy. Feeling nervous about going to sleep. Not feeling rested in the morning. Having trouble concentrating. Feeling irritable, anxious, or depressed. How is  this diagnosed? This condition may be diagnosed based on: Your symptoms and medical history. Your health care provider may ask about: Your sleep habits. Any medical conditions you have. Your mental health. A physical exam. How is this treated? Treatment for insomnia depends on the cause. Treatment may focus on treating an underlying condition that is causing insomnia. Treatment may also include: Medicines to help you sleep. Counseling or therapy. Lifestyle adjustments to help you sleep better. Follow these instructions at home: Eating and drinking  Limit or avoid alcohol, caffeinated beverages, and cigarettes, especially close to bedtime. These can disrupt your sleep. Do not eat a large meal or eat spicy foods right before bedtime. This can lead to digestive discomfort that can make it hard for you to sleep. Sleep habits  Keep a sleep diary to help you and your health care provider figure out what could be causing your insomnia. Write down: When you sleep. When you wake up during the night. How well you sleep. How rested you feel the next day. Any side effects of medicines you are taking. What you eat and drink. Make your bedroom a dark, comfortable place where it is easy to fall asleep. Put up shades or blackout curtains to block light from outside. Use a white noise machine to block noise. Keep the temperature cool. Limit screen use before bedtime. This includes: Watching TV. Using your smartphone, tablet, or computer. Stick to a routine that includes going to bed and waking up at the same times every day and night. This can help you fall asleep faster. Consider making a quiet activity, such as reading, part of your nighttime routine. Try to avoid taking naps during the day so that you sleep better at night. Get out of bed if you are still awake after 15 minutes of trying to sleep. Keep the  lights down, but try reading or doing a quiet activity. When you feel sleepy, go back to  bed. General instructions Take over-the-counter and prescription medicines only as told by your health care provider. Exercise regularly, as told by your health care provider. Avoid exercise starting several hours before bedtime. Use relaxation techniques to manage stress. Ask your health care provider to suggest some techniques that may work well for you. These may include: Breathing exercises. Routines to release muscle tension. Visualizing peaceful scenes. Make sure that you drive carefully. Avoid driving if you feel very sleepy. Keep all follow-up visits as told by your health care provider. This is important. Contact a health care provider if: You are tired throughout the day. You have trouble in your daily routine due to sleepiness. You continue to have sleep problems, or your sleep problems get worse. Get help right away if: You have serious thoughts about hurting yourself or someone else. If you ever feel like you may hurt yourself or others, or have thoughts about taking your own life, get help right away. You can go to your nearest emergency department or call: Your local emergency services (911 in the [place] A suicide crisis helpline, such as the [place] at (415) 872-9154 or 988 in the [place]. This is open 24 hours a day. Summary Insomnia is a sleep disorder that makes it difficult to fall asleep or stay asleep. Insomnia can be long-term (chronic) or short-term (acute). Treatment for insomnia depends on the cause. Treatment may focus on treating an underlying condition that is causing insomnia. Keep a sleep diary to help you and your health care provider figure out what could be causing your insomnia. This information is not intended to replace advice given to you by your health care provider. Make sure you discuss any questions you have with your health care provider. Document Revised: 01/26/2021 Document Reviewed: 05/13/2020 Elsevier Patient Education   2022 [employer].

## 2021-09-07 ENCOUNTER — Other Ambulatory Visit: Payer: Self-pay | Admitting: Family Medicine

## 2021-09-07 ENCOUNTER — Other Ambulatory Visit: Payer: Self-pay | Admitting: Cardiology

## 2021-09-07 DIAGNOSIS — E538 Deficiency of other specified B group vitamins: Secondary | ICD-10-CM

## 2021-09-07 NOTE — Telephone Encounter (Signed)
Prescription refill request for Xarelto received.  Indication: Afib  Last office visit: 08/08/21 ([place])  Weight: 79.8kg Age: 71 Scr: 1.06 (08/15/20)  CrCl: 60.78ml/min  Appropriate dose and refill sent to requested pharmacy.

## 2021-09-14 ENCOUNTER — Other Ambulatory Visit: Payer: Self-pay | Admitting: Family Medicine

## 2021-09-14 DIAGNOSIS — E538 Deficiency of other specified B group vitamins: Secondary | ICD-10-CM

## 2021-09-14 MED ORDER — OMEPRAZOLE 20 MG PO CPDR: DELAYED_RELEASE_CAPSULE | ORAL | 3 refills | Status: DC

## 2021-09-26 ENCOUNTER — Ambulatory Visit (INDEPENDENT_AMBULATORY_CARE_PROVIDER_SITE_OTHER): Payer: PPO | Admitting: Pulmonary Disease

## 2021-09-26 ENCOUNTER — Other Ambulatory Visit: Payer: Self-pay

## 2021-09-26 ENCOUNTER — Encounter: Payer: Self-pay | Admitting: Pulmonary Disease

## 2021-09-26 VITALS — BP 136/68 | HR 54 | Temp 98.8°F | Ht 67.0 in | Wt 178.6 lb

## 2021-09-26 DIAGNOSIS — R0602 Shortness of breath: Secondary | ICD-10-CM

## 2021-09-26 DIAGNOSIS — R918 Other nonspecific abnormal finding of lung field: Secondary | ICD-10-CM

## 2021-09-26 NOTE — Addendum Note (Signed)
Addended by: Elton Sin on: 09/26/2021 05:00 PM ? ? Modules accepted: Orders ? ?

## 2021-09-26 NOTE — Patient Instructions (Signed)
Glad you are stable with regard to breathing ?We will get a follow-up CT high-resolution in 1 year.  This can be done at [facility] ?Return to clinic in 1 year after CT scan ?

## 2021-09-26 NOTE — Progress Notes (Signed)
? ?      April Munoz    [id number]    1951-04-17 ? ?Primary Care Physician:Munoz, April Maxwell, Munoz ? ?Referring Physician: [name], Munoz ?[address] ?[address] ?[place],  [place] [address] ? ?Chief complaint: Evaluation for abnormal CT ? ?HPI: ?71 year old with history of hypertension, atrial fibrillation, diabetes, stroke, allergic rhinitis ?Had a CT abdomen done earlier this year which showed mild tree-in-bud in the left lower lobe.  Follow-up CT this month showed persistent abnormalities then she has been referred for further evaluation ? ?She has history of chronic sinusitis, allergic rhinitis, gastric bypass with GERD and Barrett's esophagus ?Follows with [facility] Munoz [facility] at [facility] and is on a PPI ? ?Has occasional cough, nonproductive in nature, chest congestion.  Denies any fevers, chills though on occasion she feels some low-grade temperature ? ?Pets: Has a dog ?Occupation: Used to work for a [employer] ?Exposures: No known exposures.  No mold, hot tub, Jacuzzi ?Smoking history: Never smoker ?Travel history: No significant travel history ?Relevant family history: Brother has lung cancer.  He was a smoker. ? ?Interval history: ?She is doing well with regard to breathing.  No complaints of dyspnea.  Has inability to take deep breath but denies any wheezing, cough or sputum production. ? ?Outpatient Encounter Medications as of 09/26/2021  ?Medication Sig  ? acyclovir (ZOVIRAX) 800 MG tablet TAKE 1 TABLET BY MOUTH FOUR TIMES A DAY (Patient taking differently: As needed)  ? atorvastatin (LIPITOR) 20 MG tablet Take 1 tablet (20 mg total) by mouth daily.  ? B Complex-C-Folic Acid TABS Take 1 tablet by mouth daily.  ? Continuous Blood Gluc Receiver (FREESTYLE LIBRE 2 READER) DEVI E11.21 Check blood sugar 4 times daily as directed  ? Continuous Blood Gluc Sensor (FREESTYLE LIBRE 2 SENSOR) MISC E11.21 Change sensor every 14 days as directed  ? cyanocobalamin (,VITAMIN B-12,) 1000 MCG/ML injection INJECT 1 ML  INTO THE MUSCLE EVERY 30 DAYS  ? dapagliflozin propanediol (FARXIGA) 10 MG TABS tablet Take 10 mg by mouth daily.  ? Dulaglutide (TRULICITY) 4.03 KV/4.2VZ SOPN Inject 0.75 mg into the skin once a week.  ? EPINEPHrine 0.3 mg/0.3 mL IJ SOAJ injection Inject 0.3 mg into the muscle as needed.  ? metFORMIN (GLUCOPHAGE) 1000 MG tablet TAKE ONE TABLET TWICE DAILY WITH MEALS (Patient taking differently: TAKE ONE TABLET DAILY WITH MEALS)  ? metoprolol succinate (TOPROL-XL) 50 MG 24 hr tablet Take 1 tablet (50 mg total) by mouth at bedtime.  ? Multiple Vitamin (MULTIVITAMIN WITH MINERALS) TABS tablet Take 1 tablet by mouth daily.  ? omeprazole (PRILOSEC) 20 MG capsule TAKE ONE (1) CAPSULE EACH DAY Strength: 20 mg  ? Probiotic Product (PROBIOTIC & ACIDOPHILUS EX ST PO) Take 1 tablet by mouth daily.   ? solifenacin (VESICARE) 5 MG tablet TAKE 1 TABLET BY MOUTH EVERY DAY  ? torsemide (DEMADEX) 20 MG tablet Take 1 tablet (20 mg total) by mouth 3 (three) times a week. Please take this on Monday, Wednesday, and Friday.  ? venlafaxine XR (EFFEXOR-XR) 75 MG 24 hr capsule TAKE 1 CAPSULE DAILY WITH BREAKFAST FOR 7 DAYS; THEN 2 CAPSULES DAILY WITH BREAKFAST FOR 23 DAYS.  ? Vitamin D, Ergocalciferol, (DRISDOL) 1.25 MG (50000 UNIT) CAPS capsule Take 1 capsule (50,000 Units total) by mouth every 7 (seven) days.  ? XARELTO 20 MG TABS tablet TAKE 1 TABLET BY MOUTH EVERY DAY WITH SUPPER  ? Zinc Sulfate (ZINC 15 PO) Take 150 mg by mouth 3 (three) times a week.  ? [  DISCONTINUED] Coenzyme Q10 100 MG TABS Take 1 tablet by mouth daily.  ? [DISCONTINUED] omega-3 acid ethyl esters (LOVAZA) 1 g capsule Take 2 capsules by mouth 2 (two) times daily.  ? ?No facility-administered encounter medications on file as of 09/26/2021.  ? ?Physical Exam: ?Blood pressure 136/68, pulse (!) 54, temperature 98.8 ?F (37.1 ?C), temperature source Oral, height '5\' 7"'$  (1.702 m), weight 178 lb 9.6 oz (81 kg), SpO2 96 %. ?Gen:      No acute distress ?HEENT:  EOMI, sclera  anicteric ?Neck:     No masses; no thyromegaly ?Lungs:    Clear to auscultation bilaterally; normal respiratory effort ?CV:         Regular rate and rhythm; no murmurs ?Abd:      + bowel sounds; soft, non-tender; no palpable masses, no distension ?Ext:    No edema; adequate peripheral perfusion ?Skin:      Warm and dry; no rash ?Neuro: alert and oriented x 3 ?Psych: normal mood and affect  ? ?Data Reviewed: ?Imaging: ?CT abdomen pelvis April Munoz 04/12/2020 -tree-in-bud opacity in the left lower lobe ?CT chest [facility] 04/29/2020-persistent tree-in-bud opacity in the left lower lobe, 4 mm nodule in the left apex. ?CT chest 08/10/2020- Mild septal thickening of the lung base, mild air trapping, aortic atherosclerosis ?I have reviewed the images personally. ? ?PFTs: ?Reviewed PFTs from [facility] dated 08/10/2020 ?FVC 2.29 [6%], FEV1 1.89 [72%], F/F 83, TLC 3.59 (3%), ERV 0.42 [9%], DLCO 15.27 [51%], DLCO/VA 78% ? ?Moderate restriction, diffusion defect that corrects for alveolar volume.  Suspect this is due to body habitus.  There is no evidence of interstitial lung disease on CT scan ? ?Labs: ?CBC 09/18/2019-WBC 7.1, eos 3%, absolute eosinophil count 213 ? ?Assessment:  ?Abnormal CT ?Initial scan showed minimal tree-in-bud opacities in 2021.  Follow-up scan in June 2022 shows improvement in opacities with minimal nonspecific changes.  PFTs reviewed with moderate restriction, diffusion defect that corrects for alveolar volume.  Suspect this is due to body habitus.  There is no evidence of interstitial lung disease on CT scan ? ?Suspect that she has chronic aspiration secondary to GERD in the setting of Barrett's esophagitis that may be causing the minimal changes at the base.  Advised her to follow-up with her GI doctor regarding this and continue PPI. ? ?Follow-up CT in 1 year for monitoring ? ?Plan/Recommendations: ?High-resolution CT in 1 year ? ?April Garfinkel Munoz ?[facility] ?09/26/2021, 2:05  PM ? ?CC: April Munoz ? ?

## 2021-09-27 ENCOUNTER — Telehealth: Payer: Self-pay

## 2021-09-27 NOTE — Telephone Encounter (Signed)
LVM for pt to call me back to schedule sleep study  

## 2021-10-04 ENCOUNTER — Telehealth: Payer: Self-pay

## 2021-10-04 NOTE — Progress Notes (Signed)
Pt has been reminded of appt with CPP on 3/23. FYI she just lost her brother so she has had a lot going on . ? ?Elray Mcgregor, CMA ?Clinical Pharmacist Assistant  ?[phone]  ?

## 2021-10-06 ENCOUNTER — Telehealth: Payer: Self-pay

## 2021-10-06 ENCOUNTER — Other Ambulatory Visit: Payer: Self-pay | Admitting: Physician Assistant

## 2021-10-06 ENCOUNTER — Telehealth: Payer: 59

## 2021-10-06 DIAGNOSIS — N3941 Urge incontinence: Secondary | ICD-10-CM

## 2021-10-06 NOTE — Telephone Encounter (Signed)
?  Care Management  ? ?Follow Up Note ? ? ?10/06/2021 ?Name: April Munoz MRN: [id number] DOB: 09-18-1950 ? ? ?Referred by: [name], MD ?Reason for referral : Chronic Care Management ? ? ?Successful contact was made with the patient to discuss care management and care coordination services. Patient declines engagement at this time.  Patient was attending a funeral and would like to re-schedule ? ?Follow Up Plan: The patient has been provided with contact information for the care management team and has been advised to call with any health related questions or concerns.  ? ?[place] Constable, Pharm.D. - [phone] ? ? ?

## 2021-10-06 NOTE — Telephone Encounter (Signed)
Pt will call back to schedule her sleep study ?

## 2021-10-10 ENCOUNTER — Telehealth: Payer: Self-pay | Admitting: *Deleted

## 2021-10-10 DIAGNOSIS — K219 Gastro-esophageal reflux disease without esophagitis: Secondary | ICD-10-CM | POA: Diagnosis not present

## 2021-10-10 DIAGNOSIS — K624 Stenosis of anus and rectum: Secondary | ICD-10-CM | POA: Diagnosis not present

## 2021-10-10 DIAGNOSIS — R1110 Vomiting, unspecified: Secondary | ICD-10-CM | POA: Diagnosis not present

## 2021-10-10 DIAGNOSIS — K2270 Barrett's esophagus without dysplasia: Secondary | ICD-10-CM | POA: Diagnosis not present

## 2021-10-10 MED ORDER — METOPROLOL SUCCINATE ER 50 MG PO TB24: 50.0000 mg | ORAL_TABLET | Freq: Every day | ORAL | 1 refills | Status: DC

## 2021-10-10 NOTE — Telephone Encounter (Signed)
Rx refill sent to pharmacy. 

## 2021-10-13 DIAGNOSIS — D649 Anemia, unspecified: Secondary | ICD-10-CM | POA: Diagnosis not present

## 2021-10-13 DIAGNOSIS — D510 Vitamin B12 deficiency anemia due to intrinsic factor deficiency: Secondary | ICD-10-CM | POA: Diagnosis not present

## 2021-10-16 ENCOUNTER — Other Ambulatory Visit: Payer: Self-pay | Admitting: Family Medicine

## 2021-10-17 ENCOUNTER — Telehealth: Payer: Self-pay

## 2021-10-17 ENCOUNTER — Telehealth: Payer: Self-pay | Admitting: *Deleted

## 2021-10-17 DIAGNOSIS — I4819 Other persistent atrial fibrillation: Secondary | ICD-10-CM

## 2021-10-17 DIAGNOSIS — Z01812 Encounter for preprocedural laboratory examination: Secondary | ICD-10-CM

## 2021-10-17 NOTE — Telephone Encounter (Signed)
Pt will stop by the [facility] office tomorrow for pre procedure blood work. ? ? ?

## 2021-10-17 NOTE — Progress Notes (Signed)
Pt was reminded of app with CPP on 4/5 ? ?Elray Mcgregor, CMA ?Clinical Pharmacist Assistant  ?[phone]  ?

## 2021-10-18 DIAGNOSIS — Z01812 Encounter for preprocedural laboratory examination: Secondary | ICD-10-CM | POA: Diagnosis not present

## 2021-10-18 DIAGNOSIS — I4819 Other persistent atrial fibrillation: Secondary | ICD-10-CM | POA: Diagnosis not present

## 2021-10-19 ENCOUNTER — Other Ambulatory Visit: Payer: Self-pay

## 2021-10-19 ENCOUNTER — Telehealth (HOSPITAL_COMMUNITY): Payer: Self-pay | Admitting: *Deleted

## 2021-10-19 ENCOUNTER — Ambulatory Visit (INDEPENDENT_AMBULATORY_CARE_PROVIDER_SITE_OTHER): Payer: PPO

## 2021-10-19 DIAGNOSIS — M810 Age-related osteoporosis without current pathological fracture: Secondary | ICD-10-CM

## 2021-10-19 DIAGNOSIS — I4811 Longstanding persistent atrial fibrillation: Secondary | ICD-10-CM

## 2021-10-19 DIAGNOSIS — E782 Mixed hyperlipidemia: Secondary | ICD-10-CM

## 2021-10-19 DIAGNOSIS — F331 Major depressive disorder, recurrent, moderate: Secondary | ICD-10-CM

## 2021-10-19 LAB — BASIC METABOLIC PANEL
BUN/Creatinine Ratio: 17 (ref 12–28)
BUN: 13 mg/dL (ref 8–27)
CO2: 24 mmol/L (ref 20–29)
Calcium: 9 mg/dL (ref 8.7–10.3)
Chloride: 107 mmol/L — ABNORMAL HIGH (ref 96–106)
Creatinine, Ser: 0.77 mg/dL (ref 0.57–1.00)
Glucose: 113 mg/dL — ABNORMAL HIGH (ref 70–99)
Potassium: 4.4 mmol/L (ref 3.5–5.2)
Sodium: 144 mmol/L (ref 134–144)
eGFR: 82 mL/min/{1.73_m2} (ref >59)

## 2021-10-19 LAB — CBC
Hematocrit: 40.4 % (ref 34.0–46.6)
Hemoglobin: 13 g/dL (ref 11.1–15.9)
MCH: 30.9 pg (ref 26.6–33.0)
MCHC: 32.2 g/dL (ref 31.5–35.7)
MCV: 96 fL (ref 79–97)
Platelets: 308 10*3/uL (ref 150–450)
RBC: 4.21 x10E6/uL (ref 3.77–5.28)
RDW: 12.8 % (ref 11.7–15.4)
WBC: 6.3 10*3/uL (ref 3.4–10.8)

## 2021-10-19 NOTE — Telephone Encounter (Signed)
Reaching out to patient to offer assistance regarding upcoming cardiac imaging study; pt verbalizes understanding of appt date/time, parking situation and where to check in, pre-test NPO status and verified current allergies; name and call back number provided for further questions should they arise ? ?Gordy Clement RN Navigator Cardiac Imaging ?[facility] ?[phone] office ?[phone] cell ? ?Patient to take her daily medications and is aware to arrive at 1pm. ?

## 2021-10-19 NOTE — Progress Notes (Signed)
? ?Chronic Care Management ?Pharmacy Note ? ?10/19/2021 ?Name:  April Munoz MRN:  [id number] DOB:  1951/03/28  ? ?Plan Recommendations:  ?Patient would like Metoprolol Succ 21m changed to Metoprolol Succ 226mBID, she cuts it in half because she feels it works better. Will ask PCP to send new script to "[facility]" since patient is changing pharmacies ?Metformin written BID but patient only taking QD, will ask PCP to send updated script to [facility] as well ?Venlafaxine script says 7530maper up to 2QD. Will ask PCP to send new script with 150m80m written ?Patient's brother passed away last week, patient is under a lot of stress, spent majority of visit listening to patient ?No f/u scheduled with PCP, will coordinate with CaroHoyle Sauerget set up ? ?Subjective: ?April Munoz 70 y73. year old female who is a primary patient of Cox, Kirsten, MD.  The CCM team was consulted for assistance with disease management and care coordination needs.   ? ?Engaged with patient face to face for follow up visit in response to provider referral for pharmacy case management and/or care coordination services.  ? ?Consent to Services:  ?The patient was given the following information about Chronic Care Management services today, agreed to services, and gave verbal consent: 1. CCM service includes personalized support from designated clinical staff supervised by the primary care provider, including individualized plan of care and coordination with other care providers 2. 24/7 contact phone numbers for assistance for urgent and routine care needs. 3. Service will only be billed when office clinical staff spend 20 minutes or more in a month to coordinate care. 4. Only one practitioner may furnish and bill the service in a calendar month. 5.The patient may stop CCM services at any time (effective at the end of the month) by phone call to the office staff. 6. The patient will be responsible for cost sharing (co-pay) of up to 20% of  the service fee (after annual deductible is met). Patient agreed to services and consent obtained. ? ?Patient Care Team: ?Cox, KiElnita Maxwell as PCP - General (Family Medicine) ?MunlRichardo Priest as PCP - Cardiology (Cardiology) ?CamnConstance Haw as PCP - Electrophysiology (Cardiology) ?MunlRichardo Priest as Consulting Physician (Cardiology) ?Misenheimer, TimoChristia Reading as Consulting Physician (Unknown Physician Specialty) ?MorgPollyann Samples as Consulting Physician (General Surgery) ?KennLane HackerH [facility] Center[facility] (Pharmacist) ? ?Recent office visits: ?11/02/2020 - ordered ekg, echo, carotid ultrasound and labs.  ?10/01/2020 - add Farxiga 5 mg daily. Continue with Trulicity. Follow-up with psychiatry.  ?05/27/2020 - increase zolpidem 10 mg once daily. Work on [employer] exercise. Lab results: Sugar elevated. TSH abnormal - increase Synthroid 88 mcg daily in am. Continue vitamin D supplement. LDL elevated and TG elevated. LFT and Kidney function normal. No evidence of B12 deficiency.  ? ?Recent consult visits: ?11/15/2020 - cardio - cha2ds2-vasc 6. Start flecanide 100 mg.  ?11/05/2020 - stop Eliquis and start Xarelto 20 mg.  ? ?08/05/2020 - Cardio - continue beta-blocker and anticoagulation. Order CT scan and PFTs.  ?06/02/2020 - GI - continue PPI. Fiber daily and miralax prn for constipation.  ?05/26/2020 - pulmonology - labs ordered. Schedule CT in 3 months.  ?04/26/2020 - Cardio - take diuretic 3 days a week. Refer to pulmonary if atypical pulmonary infection. Continue anticoagulant. Resume statin since previous TIA and hyperlipidemia.  ?03/09/2020 - nephrology - manage blood pressure. Low sodium diet. Continue ACE/ARB.  ? ? ?Hospital visits: ? ? ?  Objective: ? ?Lab Results  ?Component Value Date  ? CREATININE 0.77 10/18/2021  ? BUN 13 10/18/2021  ? GFRNONAA 53 (L) 09/03/2020  ? GFRAA 61 09/03/2020  ? NA 144 10/18/2021  ? K 4.4 10/18/2021  ? CALCIUM 9.0 10/18/2021  ? CO2 24 10/18/2021  ? ? ?Lab  Results  ?Component Value Date/Time  ? HGBA1C 6.1 (H) 06/15/2021 10:19 AM  ? HGBA1C 6.1 (H) 03/10/2021 09:06 AM  ? MICROALBUR 80 03/15/2021 10:11 PM  ? MICROALBUR 150 05/27/2020 09:39 AM  ?  ?Last diabetic Eye exam:  ?Lab Results  ?Component Value Date/Time  ? HMDIABEYEEXA No Retinopathy 10/14/2020 12:00 AM  ?  ?Last diabetic Foot exam: No results found for: HMDIABFOOTEX  ? ?Lab Results  ?Component Value Date  ? CHOL 219 (H) 06/15/2021  ? HDL 75 06/15/2021  ? [place] 115 (H) 06/15/2021  ? TRIG 168 (H) 06/15/2021  ? CHOLHDL 2.9 06/15/2021  ? ? ? ?  Latest Ref Rng & Units 06/15/2021  ? 10:19 AM 03/10/2021  ?  9:06 AM 09/14/2020  ? 10:31 AM  ?Hepatic Function  ?Total Protein 6.0 - 8.5 g/dL 6.6   6.4   6.0    ?Albumin 3.7 - 4.7 g/dL 4.4   4.3   3.8    ?AST 0 - 40 IU/L '26   17   24    ' ?ALT 0 - 32 IU/L 24   18   42    ?Alk Phosphatase 44 - 121 IU/L 100   74   115    ?Total Bilirubin 0.0 - 1.2 mg/dL 0.5   0.5   0.4    ? ? ?Lab Results  ?Component Value Date/Time  ? TSH 4.170 06/15/2021 10:19 AM  ? TSH 3.900 01/31/2021 10:17 AM  ? ? ? ?  Latest Ref Rng & Units 10/18/2021  ?  1:50 PM 06/15/2021  ? 10:19 AM 03/10/2021  ?  9:06 AM  ?CBC  ?WBC 3.4 - 10.8 x10E3/uL 6.3   8.7   6.9    ?Hemoglobin 11.1 - 15.9 g/dL 13.0   15.4   13.6    ?Hematocrit 34.0 - 46.6 % 40.4   47.2   40.8    ?Platelets 150 - 450 x10E3/uL 308   412   309    ? ? ?Lab Results  ?Component Value Date/Time  ? VD25OH 49.8 09/03/2020 09:53 AM  ? VD25OH 34.4 05/31/2020 09:18 AM  ? ? ?Clinical ASCVD: No  ?The 10-year ASCVD risk score (Arnett DK, et al., 2019) is: 30.5% ?  Values used to calculate the score: ?    Age: 89 years ?    Sex: Female ?    Is Non-Hispanic African American: No ?    Diabetic: Yes ?    Tobacco smoker: No ?    Systolic Blood Pressure: 381 mmHg ?    Is BP treated: Yes ?    HDL Cholesterol: 75 mg/dL ?    Total Cholesterol: 219 mg/dL   ? ? ?  06/19/2021  ?  8:54 PM 03/08/2021  ? 11:14 AM 02/14/2021  ? 11:06 AM  ?Depression screen PHQ 2/9  ?Decreased Interest  '1 1 3  ' ?Down, Depressed, Hopeless '2 1 3  ' ?PHQ - 2 Score '3 2 6  ' ?Altered sleeping '3 3 3  ' ?Tired, decreased energy '2 1 2  ' ?Change in appetite 1 0 3  ?Feeling bad or failure about yourself  2 0 0  ?Trouble concentrating 2 0 3  ?  Moving slowly or fidgety/restless '2 1 3  ' ?Suicidal thoughts 0 0 0  ?PHQ-9 Score '15 7 20  ' ?Difficult doing work/chores Somewhat difficult Somewhat difficult Not difficult at all  ?  ? ? ?Social History  ? ?Tobacco Use  ?Smoking Status Never  ?Smokeless Tobacco Never  ? ?BP Readings from Last 3 Encounters:  ?09/26/21 136/68  ?08/29/21 (!) 162/90  ?08/08/21 (!) 142/84  ? ?Pulse Readings from Last 3 Encounters:  ?09/26/21 (!) 54  ?08/29/21 63  ?08/08/21 63  ? ?Wt Readings from Last 3 Encounters:  ?09/26/21 178 lb 9.6 oz (81 kg)  ?08/29/21 176 lb (79.8 kg)  ?08/08/21 172 lb (78 kg)  ? ? ?Assessment/Interventions: Review of patient past medical history, allergies, medications, health status, including review of consultants reports, laboratory and other test data, was performed as part of comprehensive evaluation and provision of chronic care management services.  ? ?SDOH:  (Social Determinants of Health) assessments and interventions performed: Yes ? ? ? ?CCM Care Plan ? ?No Known Allergies ? ? ?Medications Reviewed Today   ? ? Reviewed by Lane Hacker, [facility] (Pharmacist) on 10/19/21 at [phone]  Med List Status: <None>  ? ?Medication Order Taking? Sig Documenting Provider Last Dose Status Informant  ?acyclovir (ZOVIRAX) 800 MG tablet [id number] Yes TAKE 1 TABLET BY MOUTH FOUR TIMES A DAY  ?Patient taking differently: As needed  ? Marge Duncans, PA-C Taking Active   ?atorvastatin (LIPITOR) 20 MG tablet [id number] Yes Take 1 tablet (20 mg total) by mouth daily. [name], MD Taking Active   ?B Complex-C-Folic Acid TABS [id number] Yes Take 1 tablet by mouth daily. [provider] Taking Active Self  ?Continuous Blood Gluc Receiver (FREESTYLE LIBRE 2 READER) DEVI [id number] Yes E11.21 Check blood  sugar 4 times daily as directed Cox, Elnita Maxwell, MD Taking Active Self  ?Continuous Blood Gluc Sensor (FREESTYLE LIBRE 2 SENSOR) MISC [id number] Yes E11.21 Change sensor every 14 days as directed Cox, Elnita Maxwell,

## 2021-10-19 NOTE — Patient Instructions (Signed)
Visit Information ? ? Goals Addressed   ?None ?  ? ?Patient Care Plan: [place]  ?  ? ?Problem Identified: afib, diabetes, hypertension, hyperlipidemia   ?Priority: High  ?Onset Date: 08/30/2020  ?  ? ?Goal: Disease Management   ?Start Date: 08/30/2020  ?Expected End Date: 08/30/2021  ?Recent Progress: On track  ?Priority: High  ?Note:   ?Current Barriers:  ?Unable to independently afford treatment regimen ?Unable to maintain control of diabetes ? ?Pharmacist Clinical Goal(s):  ?Over the next 90 days, patient will verbalize ability to afford treatment regimen ?achieve control of diabetes as evidenced by a1c through collaboration with PharmD and provider.  ? ?Interventions: ?1:1 collaboration with Cox, Kirsten, MD regarding development and update of comprehensive plan of care as evidenced by provider attestation and co-signature ?Inter-disciplinary care team collaboration (see longitudinal plan of care) ?Comprehensive medication review performed; medication list updated in electronic medical record ? ?Cardio (BP goal <140/90) ?BP Readings from Last 3 Encounters:  ?09/26/21 136/68  ?08/29/21 (!) 162/90  ?08/08/21 (!) 142/84  ?-controlled ?-Current treatment: ?Losartan 50 mg daily Appropriate, Query effective, Safe, Accessible ?Metoprolol XL 62m QD Appropriate, Query effective, Safe, Accessible ?-Medications previously tried: none reported  ?-Current home readings:  ? -Jan 26962 1952-841systolic but didn't have numbers ?-Current dietary habits: reports eating during the night some.  ?-Current exercise habits: none reported  ?-Denies hypotensive/hypertensive symptoms ?-Educated on BP goals and benefits of medications for prevention of heart attack, stroke and kidney damage; ?Daily salt intake goal < 2300 mg; ?Exercise goal of 150 minutes per week; ?Importance of home blood pressure monitoring; ?Proper BP monitoring technique; ?-Counseled to monitor BP at home daily, document, and provide log at future  appointments ?-Counseled on diet and exercise extensively ?Jan 2023: Patient states 1324-401systolic normal but doesn't write down. Has appt with Cardio next week. Will write down values and bring into Cardio visit ?April 2023: Patient would like Metoprolol Succ 526mchanged to 2555mucc BID ? ?Hyperlipidemia: (LDL goal < 55) ?The 10-year ASCVD risk score (Arnett DK, et al., 2019) is: 30.5% ?  Values used to calculate the score: ?    Age: 71 4[age] ?    Sex: Female ?    Is Non-Hispanic African American: No ?    Diabetic: Yes ?    Tobacco smoker: No ?    Systolic Blood Pressure: 136027Hg ?    Is BP treated: Yes ?    HDL Cholesterol: 75 mg/dL ?    Total Cholesterol: 219 mg/dL ?Lab Results  ?Component Value Date  ? CHOL 219 (H) 06/15/2021  ? CHOL 187 03/10/2021  ? CHOL 156 09/03/2020  ? ?Lab Results  ?Component Value Date  ? HDL 75 06/15/2021  ? HDL 62 03/10/2021  ? HDL 57 09/03/2020  ? ?Lab Results  ?Component Value Date  ? LDL[place]amar[place]5 (H) 06/15/2021  ? LDL[place] 03/10/2021  ? LDLFrazer 09/03/2020  ? ?Lab Results  ?Component Value Date  ? TRIG 168 (H) 06/15/2021  ? TRIG 181 (H) 03/10/2021  ? TRIG 153 (H) 09/03/2020  ? ?Lab Results  ?Component Value Date  ? CHOLHDL 2.9 06/15/2021  ? CHOLHDL 3.0 03/10/2021  ? CHOLHDL 2.7 09/03/2020  ?No results found for: LDLDIRECT ?-not ideally controlled ?-Current treatment: ?Atorvastatin 20 mg daily Query Appropriate, Query effective, Safe, Accessible ?-Medications previously tried: pravastatin, Lovaza ?-Current dietary patterns: reports eating at night. Loves eggs.  ?-Current exercise habits: none reported ?-Educated on Cholesterol goals;  ?Benefits  of statin for ASCVD risk reduction; ?Importance of limiting foods high in cholesterol; ?Exercise goal of 150 minutes per week; ?-Counseled on diet and exercise extensively ?Recommended to continue current medication ?Jan 2023: Recommend increasing statin from 61m to higher ? ?Diabetes (A1c goal <7%) ?Lab Results  ?Component Value Date   ? HGBA1C 6.1 (H) 06/15/2021  ? HGBA1C 6.1 (H) 03/10/2021  ? HGBA1C 7.1 (H) 09/03/2020  ? ?Lab Results  ?Component Value Date  ? MICROALBUR 80 03/15/2021  ? L[place]115 (H) 06/15/2021  ? CREATININE 0.77 10/18/2021  ? ?Lab Results  ?Component Value Date  ? NA 144 10/18/2021  ? K 4.4 10/18/2021  ? CREATININE 0.77 10/18/2021  ? EGFR 82 10/18/2021  ? GFRNONAA 53 (L) 09/03/2020  ? GLUCOSE 113 (H) 10/18/2021  ? ?Lab Results  ?Component Value Date  ? WBC 6.3 10/18/2021  ? HGB 13.0 10/18/2021  ? HCT 40.4 10/18/2021  ? MCV 96 10/18/2021  ? PLT 308 10/18/2021  ?-Controlled ?-Current medications: ?metformin 1000 mg daily Appropriate, Effective, Safe, Accessible ?Farxiga 10 mg daily (PAP) Appropriate, Effective, Safe, Accessible ?Trulicity 00.35mg weekly (PAP) Appropriate, Effective, Safe, Accessible ?-Medications previously tried: none reported  ?-Current home glucose readings ?fasting glucose: ?Jan 2023: Doesn't write them down but states no lows. Recommended to start writing down ?post prandial glucose: not reported ?-Denies hypoglycemic/hyperglycemic symptoms ?-Current meal patterns:  ?Patient reports eating during the night.  ?Not eating well since cardioversion.  ?-Current exercise: none reported ?-Educated onA1c and blood sugar goals; ?Complications of diabetes including kidney damage, retinal damage, and cardiovascular disease; ?Benefits of weight loss; ?Benefits of routine self-monitoring of blood sugar; ?-Counseled to check feet daily and get yearly eye exams ?-Counseled on diet and exercise extensively ?April 2023: Will ask PCP to change Metformin BID to QD which is how patient is taking medication ? ? ?Atrial Fibrillation (Goal: prevent stroke and major bleeding) ?-controlled  ?-CHADSVASC: 6 ?-Current treatment: ?Rate control:  ?metoprolol xl 564mQD Appropriate, Effective, Safe, Accessible ?flecanide 100 mg bid Appropriate, Effective, Safe, Accessible ?Losartan 5096mD Appropriate, Effective, Safe,  Accessible ?Anticoagulation:  ?Xarelto 20 mg daily Appropriate, Effective, Safe, Accessible ?-Medications previously tried: Eliquis  ?-Home BP and HR readings: none reported  ?-Counseled on increased risk of stroke due to Afib and benefits of anticoagulation for stroke prevention; ?importance of adherence to anticoagulant exactly as prescribed; ?avoidance of NSAIDs due to increased bleeding risk with anticoagulants; ?-Counseled on diet and exercise extensively ?April 2023: Patient would like Metoprolol Succ 34m48manged to 25mg58mc BID ? ?Osteoporosis / Osteopenia (Goal  Minimize risk of break or fracture) ?-controlled ?-Last DEXA Scan: not available in chart  ? -Current treatment  ?calcium/vitamin D/vitamin k chew daily Appropriate, Effective, Safe, Accessible ?Vitamin D 50,000 units weekly on Monday Appropriate, Effective, Safe, Accessible ?-Medications previously tried: none reported  ?-Recommend [phone] units of vitamin D daily. Recommend 1200 mg of calcium daily from dietary and supplemental sources. Recommend weight-bearing and muscle strengthening exercises for building and maintaining bone density. ?-Counseled on diet and exercise extensively ?Jan 2023: Patient unsure when last Dexa was, will ask PCP ?April 2023: DEXA scheduled for 12/14/21 ? ?Depression (Goal: manage symptoms of depression) ?-not ideally controlled ? ?  06/19/2021  ?  8:54 PM 03/08/2021  ? 11:14 AM 02/14/2021  ? 11:06 AM  ?Depression screen PHQ 2/9  ?Decreased Interest _0 ?Down, Depressed, Hopeless _1 ?PHQ - 2 Score _2 ?Altered sleeping _3 ?  Tired, decreased energy _0 ?Change in appetite 1 0 3  ?Feeling bad or failure about yourself  2 0 0  ?Trouble concentrating 2 0 3  ?Moving slowly or fidgety/restless _1 ?Suicidal thoughts 0 0 0  ?PHQ-9 Score _2 ?Difficult doing work/chores Somewhat difficult Somewhat difficult Not difficult at all  ?-Current treatment  ?Venlafaxine XR 167m QD Appropriate, Query effective,  Safe, Accessible ?Olanzapine 2.5546mHS Appropriate, Query effective, Query Safe, Accessible ?Solifenacin 46m14mS Appropriate, Query effective, Query Safe, Accessible ?-Medications previously tried: Trintellix, Rexulti, states sh

## 2021-10-20 ENCOUNTER — Ambulatory Visit (HOSPITAL_COMMUNITY)
Admission: RE | Admit: 2021-10-20 | Discharge: 2021-10-20 | Disposition: A | Discharge status: Home / Self Care | Payer: PPO | Source: Ambulatory Visit | Attending: Cardiology | Admitting: Cardiology

## 2021-10-20 ENCOUNTER — Other Ambulatory Visit: Payer: Self-pay

## 2021-10-20 DIAGNOSIS — I4819 Other persistent atrial fibrillation: Secondary | ICD-10-CM

## 2021-10-20 MED ORDER — TORSEMIDE 20 MG PO TABS: 20.0000 mg | ORAL_TABLET | ORAL | 0 refills | Status: DC

## 2021-10-20 MED ORDER — RIVAROXABAN 20 MG PO TABS: ORAL_TABLET | ORAL | 3 refills | Status: DC

## 2021-10-20 MED ORDER — IOHEXOL 350 MG/ML SOLN
95.0000 mL | Freq: Once | INTRAVENOUS | Status: AC | PRN
  Administered 2021-10-20: 95 mL via INTRAVENOUS

## 2021-10-20 MED ORDER — METOPROLOL SUCCINATE ER 50 MG PO TB24: 50.0000 mg | ORAL_TABLET | Freq: Every day | ORAL | 1 refills | Status: DC

## 2021-10-21 ENCOUNTER — Telehealth: Payer: Self-pay

## 2021-10-21 NOTE — Chronic Care Management (AMB) (Signed)
? ? ?Chronic Care Management ?Pharmacy Assistant  ? ?Name: April Munoz  MRN: [id number] DOB: [birth year] ? ?Reason for Encounter: Pharmacy follow up ?  ?Medications: ?Outpatient Encounter Medications as of 10/21/2021  ?Medication Sig  ? ACIDOPHILUS LACTOBACILLUS PO Take 1 capsule by mouth daily.  ? acyclovir (ZOVIRAX) 800 MG tablet TAKE 1 TABLET BY MOUTH FOUR TIMES A DAY (Patient taking differently: 800 mg 4 (four) times daily as needed (shingles,).)  ? APPLE CIDER VINEGAR PO Take 480 mg by mouth daily.  ? atorvastatin (LIPITOR) 20 MG tablet Take 1 tablet (20 mg total) by mouth daily.  ? B Complex-C-Folic Acid TABS Take 1 tablet by mouth daily.  ? Cholecalciferol (VITAMIN D) 50 MCG (2000 UT) tablet Take 2,000 Units by mouth daily.  ? Continuous Blood Gluc Receiver (FREESTYLE LIBRE 2 READER) DEVI E11.21 Check blood sugar 4 times daily as directed  ? Continuous Blood Gluc Sensor (FREESTYLE LIBRE 2 SENSOR) MISC E11.21 Change sensor every 14 days as directed  ? cyanocobalamin (,VITAMIN B-12,) 1000 MCG/ML injection INJECT 1 ML INTO THE MUSCLE EVERY 30 DAYS (Patient taking differently: Inject 1,000 mcg into the muscle every 30 (thirty) days.)  ? dapagliflozin propanediol (FARXIGA) 10 MG TABS tablet Take 10 mg by mouth daily.  ? Dulaglutide (TRULICITY) 3.71 IR/6.7EL SOPN Inject 0.75 mg into the skin once a week. (Patient taking differently: Inject 0.75 mg into the skin once a week. Friday)  ? EPINEPHrine 0.3 mg/0.3 mL IJ SOAJ injection Inject 0.3 mg into the muscle as needed. (Patient taking differently: Inject 0.3 mg into the muscle as needed for anaphylaxis.)  ? ibuprofen (ADVIL) 200 MG tablet Take 400 mg by mouth every 6 (six) hours as needed for headache.  ? metFORMIN (GLUCOPHAGE) 1000 MG tablet TAKE ONE TABLET TWICE DAILY WITH MEALS (Patient taking differently: Take 1,000 mg by mouth 2 (two) times daily with a meal.)  ? metoprolol succinate (TOPROL-XL) 50 MG 24 hr tablet Take 1 tablet (50 mg total) by mouth at bedtime.  (Patient taking differently: Take 25 mg by mouth 2 (two) times daily.)  ? Multiple Vitamin (MULTIVITAMIN WITH MINERALS) TABS tablet Take 1 tablet by mouth daily.  ? OLANZapine (ZYPREXA) 2.5 MG tablet TAKE 1 TABLET BY MOUTH EVERYDAY AT BEDTIME (Patient taking differently: Take 2.5 mg by mouth at bedtime.)  ? omeprazole (PRILOSEC) 20 MG capsule TAKE ONE (1) CAPSULE EACH DAY Strength: 20 mg (Patient taking differently: Take 20 mg by mouth at bedtime.)  ? Probiotic Product (PROBIOTIC & ACIDOPHILUS EX ST PO) Take 1 tablet by mouth daily.   ? rivaroxaban (XARELTO) 20 MG TABS tablet TAKE 1 TABLET BY MOUTH EVERY DAY WITH SUPPER (Patient taking differently: Take 20 mg by mouth daily with supper.)  ? solifenacin (VESICARE) 5 MG tablet TAKE 1 TABLET BY MOUTH EVERY DAY (Patient taking differently: Take 5 mg by mouth daily.)  ? torsemide (DEMADEX) 20 MG tablet Take 1 tablet (20 mg total) by mouth 3 (three) times a week. Please take this on Monday, Wednesday, and Friday.  ? Turmeric 500 MG CAPS Take 500 mg by mouth daily.  ? venlafaxine XR (EFFEXOR-XR) 75 MG 24 hr capsule TAKE 1 CAPSULE DAILY WITH BREAKFAST FOR 7 DAYS; THEN 2 CAPSULES DAILY WITH BREAKFAST FOR 23 DAYS. (Patient taking differently: 75-150 mg daily.)  ? vitamin B-12 (CYANOCOBALAMIN) 1000 MCG tablet Take 1,000 mcg by mouth daily.  ? Vitamin D, Ergocalciferol, (DRISDOL) 1.25 MG (50000 UNIT) CAPS capsule Take 1 capsule (50,000 Units total) by mouth every 7 (  seven) days. (Patient taking differently: Take 50,000 Units by mouth every 7 (seven) days. Monday)  ? zinc gluconate 50 MG tablet Take 50 mg by mouth daily.  ? ?No facility-administered encounter medications on file as of 10/21/2021.  ? ?10-21-2021: Called [facility] to get insurance information to transfer medications to [facility]. Updated pharmacy. ?BIN: [id number] ?PCN: [id number] ?GRP: [id number] ?ID: [id number] ? ?Malecca Hicks CMA ?Clinical Pharmacist Assistant ?[phone] ? ?

## 2021-10-24 DIAGNOSIS — M1712 Unilateral primary osteoarthritis, left knee: Secondary | ICD-10-CM | POA: Diagnosis not present

## 2021-10-25 ENCOUNTER — Other Ambulatory Visit: Payer: Self-pay

## 2021-10-25 DIAGNOSIS — F322 Major depressive disorder, single episode, severe without psychotic features: Secondary | ICD-10-CM

## 2021-10-25 DIAGNOSIS — F419 Anxiety disorder, unspecified: Secondary | ICD-10-CM

## 2021-10-25 DIAGNOSIS — E538 Deficiency of other specified B group vitamins: Secondary | ICD-10-CM

## 2021-10-25 DIAGNOSIS — E559 Vitamin D deficiency, unspecified: Secondary | ICD-10-CM

## 2021-10-25 MED ORDER — ATORVASTATIN CALCIUM 20 MG PO TABS: 20.0000 mg | ORAL_TABLET | Freq: Every day | ORAL | 0 refills | Status: DC

## 2021-10-25 MED ORDER — TRULICITY 0.75 MG/0.5ML ~~LOC~~ SOAJ: 0.7500 mg | SUBCUTANEOUS | 0 refills | Status: DC

## 2021-10-25 MED ORDER — DAPAGLIFLOZIN PROPANEDIOL 10 MG PO TABS: 10.0000 mg | ORAL_TABLET | Freq: Every day | ORAL | 0 refills | Status: DC

## 2021-10-25 MED ORDER — VENLAFAXINE HCL ER 75 MG PO CP24: ORAL_CAPSULE | ORAL | 0 refills | Status: DC

## 2021-10-25 MED ORDER — VITAMIN B-12 1000 MCG PO TABS: 1000.0000 ug | ORAL_TABLET | Freq: Every day | ORAL | 0 refills | Status: DC

## 2021-10-25 MED ORDER — EPINEPHRINE 0.3 MG/0.3ML IJ SOAJ: 0.3000 mg | INTRAMUSCULAR | 1 refills | Status: DC | PRN

## 2021-10-25 MED ORDER — METOPROLOL SUCCINATE ER 25 MG PO TB24
25.0000 mg | ORAL_TABLET | Freq: Two times a day (BID) | ORAL | 0 refills | Status: DC
Start: 2021-10-25 — End: 2022-03-12

## 2021-10-25 MED ORDER — VITAMIN D (ERGOCALCIFEROL) 1.25 MG (50000 UNIT) PO CAPS: 50000.0000 [IU] | ORAL_CAPSULE | ORAL | 0 refills | Status: DC

## 2021-10-25 NOTE — Pre-Procedure Instructions (Signed)
Instructed patient on the following items: Arrival time 0830 Nothing to eat or drink after midnight No meds AM of procedure Responsible person to drive you home and stay with you for 24 hrs  Have you missed any doses of anti-coagulant Xarelto-hasn't missed any doses   

## 2021-10-27 ENCOUNTER — Ambulatory Visit (HOSPITAL_COMMUNITY)
Admission: RE | Disposition: A | Discharge status: Home / Self Care | Payer: Self-pay | Source: Home / Self Care | Attending: Cardiology

## 2021-10-27 ENCOUNTER — Other Ambulatory Visit: Payer: Self-pay

## 2021-10-27 ENCOUNTER — Ambulatory Visit (HOSPITAL_COMMUNITY)
Admission: RE | Admit: 2021-10-27 | Discharge: 2021-10-27 | Disposition: A | Discharge status: Home / Self Care | Payer: PPO | Attending: Cardiology | Admitting: Cardiology

## 2021-10-27 ENCOUNTER — Encounter (HOSPITAL_COMMUNITY): Payer: Self-pay | Admitting: Cardiology

## 2021-10-27 ENCOUNTER — Ambulatory Visit (HOSPITAL_COMMUNITY): Payer: PPO | Admitting: Certified Registered Nurse Anesthetist

## 2021-10-27 ENCOUNTER — Ambulatory Visit (HOSPITAL_BASED_OUTPATIENT_CLINIC_OR_DEPARTMENT_OTHER): Payer: PPO | Admitting: Certified Registered Nurse Anesthetist

## 2021-10-27 DIAGNOSIS — I4811 Longstanding persistent atrial fibrillation: Secondary | ICD-10-CM | POA: Insufficient documentation

## 2021-10-27 DIAGNOSIS — I10 Essential (primary) hypertension: Secondary | ICD-10-CM | POA: Diagnosis not present

## 2021-10-27 DIAGNOSIS — E1140 Type 2 diabetes mellitus with diabetic neuropathy, unspecified: Secondary | ICD-10-CM | POA: Diagnosis not present

## 2021-10-27 DIAGNOSIS — E039 Hypothyroidism, unspecified: Secondary | ICD-10-CM | POA: Diagnosis not present

## 2021-10-27 DIAGNOSIS — Z7984 Long term (current) use of oral hypoglycemic drugs: Secondary | ICD-10-CM | POA: Diagnosis not present

## 2021-10-27 DIAGNOSIS — I4892 Unspecified atrial flutter: Secondary | ICD-10-CM | POA: Diagnosis not present

## 2021-10-27 DIAGNOSIS — E785 Hyperlipidemia, unspecified: Secondary | ICD-10-CM | POA: Insufficient documentation

## 2021-10-27 DIAGNOSIS — I483 Typical atrial flutter: Secondary | ICD-10-CM | POA: Insufficient documentation

## 2021-10-27 DIAGNOSIS — I4819 Other persistent atrial fibrillation: Secondary | ICD-10-CM | POA: Diagnosis not present

## 2021-10-27 DIAGNOSIS — Z8673 Personal history of transient ischemic attack (TIA), and cerebral infarction without residual deficits: Secondary | ICD-10-CM | POA: Diagnosis not present

## 2021-10-27 DIAGNOSIS — E119 Type 2 diabetes mellitus without complications: Secondary | ICD-10-CM | POA: Diagnosis not present

## 2021-10-27 DIAGNOSIS — Z79899 Other long term (current) drug therapy: Secondary | ICD-10-CM | POA: Diagnosis not present

## 2021-10-27 DIAGNOSIS — F32A Depression, unspecified: Secondary | ICD-10-CM | POA: Diagnosis not present

## 2021-10-27 DIAGNOSIS — I4891 Unspecified atrial fibrillation: Secondary | ICD-10-CM | POA: Diagnosis present

## 2021-10-27 HISTORY — PX: ATRIAL FIBRILLATION ABLATION: EP1191

## 2021-10-27 LAB — GLUCOSE, CAPILLARY
Glucose-Capillary: 109 mg/dL — ABNORMAL HIGH (ref 70–99)
Glucose-Capillary: 93 mg/dL (ref 70–99)

## 2021-10-27 LAB — POCT ACTIVATED CLOTTING TIME
Activated Clotting Time: 293 seconds
Activated Clotting Time: 311 seconds

## 2021-10-27 SURGERY — ATRIAL FIBRILLATION ABLATION
Anesthesia: General

## 2021-10-27 MED ORDER — HEPARIN (PORCINE) IN NACL 1000-0.9 UT/500ML-% IV SOLN
INTRAVENOUS | Status: AC
  Filled 2021-10-27: qty 2500

## 2021-10-27 MED ORDER — HEPARIN SODIUM (PORCINE) 1000 UNIT/ML IJ SOLN
INTRAMUSCULAR | Status: DC | PRN
  Administered 2021-10-27: 1000 [IU] via INTRAVENOUS

## 2021-10-27 MED ORDER — DOBUTAMINE INFUSION FOR EP/ECHO/NUC (1000 MCG/ML)
INTRAVENOUS | Status: DC | PRN
  Administered 2021-10-27: 20 ug/kg/min via INTRAVENOUS

## 2021-10-27 MED ORDER — SUGAMMADEX SODIUM 200 MG/2ML IV SOLN
INTRAVENOUS | Status: DC | PRN
  Administered 2021-10-27: 50 mg via INTRAVENOUS
  Administered 2021-10-27: 100 mg via INTRAVENOUS
  Administered 2021-10-27: 150 mg via INTRAVENOUS

## 2021-10-27 MED ORDER — ONDANSETRON HCL 4 MG/2ML IJ SOLN: 4.0000 mg | Freq: Four times a day (QID) | INTRAMUSCULAR | Status: DC | PRN

## 2021-10-27 MED ORDER — FENTANYL CITRATE (PF) 250 MCG/5ML IJ SOLN
INTRAMUSCULAR | Status: DC | PRN
  Administered 2021-10-27 (×2): 50 ug via INTRAVENOUS

## 2021-10-27 MED ORDER — HEPARIN SODIUM (PORCINE) 1000 UNIT/ML IJ SOLN
INTRAMUSCULAR | Status: DC | PRN
  Administered 2021-10-27: 3000 [IU] via INTRAVENOUS
  Administered 2021-10-27: 14000 [IU] via INTRAVENOUS
  Administered 2021-10-27: 2000 [IU] via INTRAVENOUS

## 2021-10-27 MED ORDER — PHENYLEPHRINE HCL-NACL 20-0.9 MG/250ML-% IV SOLN
INTRAVENOUS | Status: DC | PRN
  Administered 2021-10-27: 20 ug/min via INTRAVENOUS

## 2021-10-27 MED ORDER — PROPOFOL 10 MG/ML IV BOLUS
INTRAVENOUS | Status: DC | PRN
  Administered 2021-10-27 (×2): 120 mg via INTRAVENOUS

## 2021-10-27 MED ORDER — PROTAMINE SULFATE 10 MG/ML IV SOLN
INTRAVENOUS | Status: DC | PRN
  Administered 2021-10-27: 40 mg via INTRAVENOUS

## 2021-10-27 MED ORDER — HEPARIN (PORCINE) IN NACL 1000-0.9 UT/500ML-% IV SOLN
INTRAVENOUS | Status: DC | PRN
  Administered 2021-10-27 (×4): 500 mL

## 2021-10-27 MED ORDER — SODIUM CHLORIDE 0.9 % IV SOLN: INTRAVENOUS | Status: DC

## 2021-10-27 MED ORDER — ONDANSETRON HCL 4 MG/2ML IJ SOLN
INTRAMUSCULAR | Status: DC | PRN
  Administered 2021-10-27: 4 mg via INTRAVENOUS

## 2021-10-27 MED ORDER — ACETAMINOPHEN 325 MG PO TABS
650.0000 mg | ORAL_TABLET | ORAL | Status: DC | PRN
  Filled 2021-10-27: qty 2

## 2021-10-27 MED ORDER — EPHEDRINE SULFATE-NACL 50-0.9 MG/10ML-% IV SOSY
PREFILLED_SYRINGE | INTRAVENOUS | Status: DC | PRN
  Administered 2021-10-27: 5 mg via INTRAVENOUS
  Administered 2021-10-27: 10 mg via INTRAVENOUS
  Administered 2021-10-27: 5 mg via INTRAVENOUS

## 2021-10-27 MED ORDER — HEPARIN SODIUM (PORCINE) 1000 UNIT/ML IJ SOLN
INTRAMUSCULAR | Status: AC
Start: 2021-10-27 — End: ?
  Filled 2021-10-27: qty 10

## 2021-10-27 MED ORDER — SODIUM CHLORIDE 0.9% FLUSH: 3.0000 mL | INTRAVENOUS | Status: DC | PRN

## 2021-10-27 MED ORDER — COLCHICINE 0.6 MG PO TABS
0.6000 mg | ORAL_TABLET | Freq: Two times a day (BID) | ORAL | 0 refills | Status: DC
Start: 2021-10-27 — End: 2022-02-08

## 2021-10-27 MED ORDER — SODIUM CHLORIDE 0.9% FLUSH: 3.0000 mL | Freq: Two times a day (BID) | INTRAVENOUS | Status: DC

## 2021-10-27 MED ORDER — LIDOCAINE 2% (20 MG/ML) 5 ML SYRINGE
INTRAMUSCULAR | Status: DC | PRN
  Administered 2021-10-27: 60 mg via INTRAVENOUS

## 2021-10-27 MED ORDER — SODIUM CHLORIDE 0.9 % IV SOLN: 250.0000 mL | INTRAVENOUS | Status: DC | PRN

## 2021-10-27 MED ORDER — ROCURONIUM BROMIDE 10 MG/ML (PF) SYRINGE
PREFILLED_SYRINGE | INTRAVENOUS | Status: DC | PRN
Start: 2021-10-27 — End: 2021-10-27
  Administered 2021-10-27: 70 mg via INTRAVENOUS

## 2021-10-27 SURGICAL SUPPLY — 19 items
BLANKET WARM UNDERBOD FULL ACC (MISCELLANEOUS) ×2
CATH OCTARAY 2.0 F 3-3-3-3-3 (CATHETERS) ×2
CATH S CIRCA THERM PROBE 10F (CATHETERS) ×2
CATH SMTCH THERMOCOOL SF DF (CATHETERS) ×2
CATH SOUNDSTAR 3D IMAGING (CATHETERS) ×2
CATH WEB BI DIR CSDF CRV REPRO (CATHETERS) ×2
CLOSURE PERCLOSE PROSTYLE (VASCULAR PRODUCTS) ×8
COVER SWIFTLINK CONNECTOR (BAG) ×2
KIT VERSACROSS STEERABLE D1 (CATHETERS) ×2
PACK EP LATEX FREE (CUSTOM PROCEDURE TRAY) ×2
PACK EP LF (CUSTOM PROCEDURE TRAY) ×1
PAD DEFIB RADIO PHYSIO CONN (PAD) ×2
PATCH CARTO3 (PAD) ×2
SHEATH AVANTI 11F 11CM (SHEATH) ×2
SHEATH CARTO VIZIGO SM CVD (SHEATH) ×2
SHEATH PINNACLE 7F 10CM (SHEATH) ×2
SHEATH PINNACLE 8F 10CM (SHEATH) ×4
SHEATH PROBE COVER 6X72 (BAG) ×2
TUBING SMART ABLATE COOLFLOW (TUBING) ×2

## 2021-10-27 NOTE — Anesthesia Postprocedure Evaluation (Signed)
Anesthesia Post Note ? ?Patient: April Munoz ? ?Procedure(s) Performed: ATRIAL FIBRILLATION ABLATION ? ?  ? ?Patient location during evaluation: PACU ?Anesthesia Type: General ?Level of consciousness: sedated and patient cooperative ?Pain management: pain level controlled ?Vital Signs Assessment: post-procedure vital signs reviewed and stable ?Respiratory status: spontaneous breathing ?Cardiovascular status: stable ?Anesthetic complications: no ? ? ?There were no known notable events for this encounter. ? ?Last Vitals:  ?Vitals:  ? 10/27/21 1400 10/27/21 1410  ?BP: (!) 148/96   ?Pulse: 80 77  ?Resp: 17 15  ?Temp:    ?SpO2: 91% 96%  ?  ?Last Pain:  ?Vitals:  ? 10/27/21 1348  ?TempSrc:   ?PainSc: 0-No pain  ? ? ?  ?  ?  ?  ?  ?  ? ?Nolon Nations ? ? ? ? ?

## 2021-10-27 NOTE — Discharge Instructions (Addendum)
Post procedure care instructions ?No driving for 4 days. No lifting over 5 lbs for 1 week. No vigorous or sexual activity for 1 week. You may return to work/your usual activities on 11/04/21. Keep procedure site clean & dry. If you notice increased pain, swelling, bleeding or pus, call/return!  You may shower after 24 hours, but no soaking in baths/hot tubs/pools for 1 week.  ? ? ?You have an appointment set up with the [facility].  Multiple studies have shown that being followed by a dedicated atrial fibrillation clinic in addition to the standard care you receive from your other physicians improves health. We believe that enrollment in the atrial fibrillation clinic will allow [place] to better care for you.  ? ?The phone number to the [facility] is [phone]. The clinic is staffed Monday through Friday from 8:30am to 5pm. ? ?Parking Directions: The clinic is located in the Heart and Vascular Building connected to [facility]. ?1)From [address] turn on to [address] and go to the 3rd entrance  (Heart and Vascular entrance) on the right. ?2)Look to the right for Heart &Vascular Parking Garage. ?3)A code for the entrance is required, for May is 1002.   ?4)Take the elevators to the 1st floor. Registration is in the room with the glass walls at the end of the hallway. ? ?If you have any trouble parking or locating the clinic, please don?t hesitate to call [phone].  ? ? ?Cardiac Ablation, Care After ? ?This sheet gives you information about how to care for yourself after your procedure. Your health care provider may also give you more specific instructions. If you have problems or questions, contact your health care provider. ?What can I expect after the procedure? ?After the procedure, it is common to have: ?Bruising around your puncture site. ?Tenderness around your puncture site. ?Skipped heartbeats. ?Tiredness (fatigue). ? ?Follow these instructions at  home: ?Puncture site care  ?Follow instructions from your health care provider about how to take care of your puncture site. Make sure you: ?If present, leave stitches (sutures), skin glue, or adhesive strips in place. These skin closures may need to stay in place for up to 2 weeks. If adhesive strip edges start to loosen and curl up, you may trim the loose edges. Do not remove adhesive strips completely unless your health care provider tells you to do that. ?If a large square bandage is present, this may be removed 24 hours after surgery.  ?Check your puncture site every day for signs of infection. Check for: ?Redness, swelling, or pain. ?Fluid or blood. If your puncture site starts to bleed, lie down on your back, apply firm pressure to the area, and contact your health care provider. ?Warmth. ?Pus or a bad smell. ?Driving ?Do not drive for at least 4 days after your procedure or however long your health care provider recommends. (Do not resume driving if you have previously been instructed not to drive for other health reasons.) ?Do not drive or use heavy machinery while taking prescription pain medicine. ?Activity ?Avoid activities that take a lot of effort for at least 7 days after your procedure. ?Do not lift anything that is heavier than 5 lb (4.5 kg) for one week.  ?No sexual activity for 1 week.  ?Return to your normal activities as told by your health care provider. Ask your health care provider what activities are safe for you. ?General instructions ?Take over-the-counter and prescription medicines only as told by your health  care provider. ?Do not use any products that contain nicotine or tobacco, such as cigarettes and e-cigarettes. If you need help quitting, ask your health care provider. ?You may shower after 24 hours, but Do not take baths, swim, or use a hot tub for 1 week.  ?Do not drink alcohol for 24 hours after your procedure. ?Keep all follow-up visits as told by your health care provider. This  is important. ?Contact a health care provider if: ?You have redness, mild swelling, or pain around your puncture site. ?You have fluid or blood coming from your puncture site that stops after applying firm pressure to the area. ?Your puncture site feels warm to the touch. ?You have pus or a bad smell coming from your puncture site. ?You have a fever. ?You have chest pain or discomfort that spreads to your neck, jaw, or arm. ?You are sweating a lot. ?You feel nauseous. ?You have a fast or irregular heartbeat. ?You have shortness of breath. ?You are dizzy or light-headed and feel the need to lie down. ?You have pain or numbness in the arm or leg closest to your puncture site. ?Get help right away if: ?Your puncture site suddenly swells. ?Your puncture site is bleeding and the bleeding does not stop after applying firm pressure to the area. ?These symptoms may represent a serious problem that is an emergency. Do not wait to see if the symptoms will go away. Get medical help right away. Call your local emergency services (911 in the [place] Do not drive yourself to the hospital. ?Summary ?After the procedure, it is normal to have bruising and tenderness at the puncture site in your groin, neck, or forearm. ?Check your puncture site every day for signs of infection. ?Get help right away if your puncture site is bleeding and the bleeding does not stop after applying firm pressure to the area. This is a medical emergency. ?This information is not intended to replace advice given to you by your health care provider. Make sure you discuss any questions you have with your health care provider. ?  ?

## 2021-10-27 NOTE — H&P (Signed)
? ?Electrophysiology Office Note ? ? ?Date:  10/27/2021  ? ?ID:  April Munoz, DOB 02/14/1951, MRN [id number] ? ?PCP:  [name], MD  ?Cardiologist: Bettina Gavia ?Primary Electrophysiologist:  Margurete Guaman Meredith Leeds, MD   ? ?No chief complaint on file. ? ? ?  ?History of Present Illness: ?April Munoz is a 71 y.o. female who is being seen today for the evaluation of atrial fibrillation at the request of No ref. provider found. Presenting today for electrophysiology evaluation. ? ?She has a history significant for persistent atrial fibrillation, depression, diabetes, hypertension, hyperlipidemia, TIA, typical atrial flutter.  She wore a 30-day monitor that showed entirely atrial fibrillation.  She is status post ablation 03/21/2019.  She then presented with typical atrial flutter and is status post ablation 05/29/2020.  She is unfortunately had left atrial flutters.  She is status post cardioversion 05/29/2021.  She is currently on flecainide. ? ?Today, denies symptoms of palpitations, chest pain, shortness of breath, orthopnea, PND, lower extremity edema, claudication, dizziness, presyncope, syncope, bleeding, or neurologic sequela. The patient is tolerating medications without difficulties. Plan ablation today.  ? ?Past Medical History:  ?Diagnosis Date  ? Anxiety   ? Arthritis   ? Ataxic gait   ? Atrial fibrillation ([place])   ? Atrophy of thyroid   ? B12 deficiency 10/31/2017  ? Cellulitis of abdominal wall 07/07/2016  ? Chronic anticoagulation 05/31/2018  ? Depression   ? Depression, major, recurrent, mild ([place]) 09/16/2019  ? Diabetes mellitus without complication ([place])   ? Diabetic glomerulopathy ([place]) 09/16/2019  ? Esophageal stricture 06/05/2017  ? Essential hypertension 02/24/2013  ? Fatigue 01/20/2020  ? Fibromyalgia 02/24/2013  ? Full dentures   ? Hereditary and idiopathic peripheral neuropathy 02/24/2013  ? High risk medication use 11/07/2017  ? Hyperlipidemia 02/24/2013  ? Hypertension   ? Hypertensive heart disease 02/24/2013  ?  Incontinence   ? Insomnia   ? Longstanding persistent atrial fibrillation ([place]) 09/16/2019  ? Mild vitamin D deficiency 09/16/2019  ? Mixed hyperlipidemia 02/24/2013  ? Murmur, cardiac 08/15/2015  ? Neuropathy in diabetes ([place]) 10/31/2017  ? Osteoporosis   ? Other amnesia   ? Other amnesia   ? 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   ? Paroxysmal atrial fibrillation ([place]) 08/15/2015  ? CHADS2vasc=3 CHADS2vasc=3  ? Primary insomnia   ? Restless legs syndrome 03/14/2013  ? Secondary hypothyroidism 09/16/2019  ? Thoracic or lumbosacral neuritis or radiculitis 02/24/2013  ? Type 2 diabetes mellitus, without long-term current use of insulin ([place]) 02/24/2013  ? Urge incontinence of urine 09/16/2019  ? Wears glasses   ? ?Past Surgical History:  ?Procedure Laterality Date  ? A-FLUTTER ABLATION N/A 07/30/2019  ? Procedure: A-FLUTTER ABLATION;  Surgeon: Constance Haw, MD;  Location: [facility];  Service: Cardiovascular;  Laterality: N/A;  ? ATRIAL FIBRILLATION ABLATION N/A 03/21/2019  ? Procedure: ATRIAL FIBRILLATION ABLATION;  Surgeon: Constance Haw, MD;  Location: [facility];  Service: Cardiovascular;  Laterality: N/A;  ? BREAST REDUCTION SURGERY  1983  ? BUBBLE STUDY  11/26/2020  ? Procedure: BUBBLE STUDY;  Surgeon: Pixie Casino, MD;  Location: [place];  Service: Cardiovascular;;  ? CARDIOVERSION N/A 11/26/2020  ? Procedure: CARDIOVERSION;  Surgeon: Pixie Casino, MD;  Location: [facility];  Service: Cardiovascular;  Laterality: N/A;  ? CATARACT EXTRACTION, BILATERAL  2018, 2019  ? CHOLECYSTECTOMY  1971  ? open  ? DIAGNOSTIC LAPAROSCOPY  2010  ? lysis of adhesions  ? DILATION AND CURETTAGE  OF UTERUS  2004  ? KNEE ARTHROSCOPY  1998  ? left  ? KNEE ARTHROSCOPY W/ LATERAL RETINACULAR REPAIR    ? MASS EXCISION Left 11/11/2013  ? Procedure: EXCISION MUCOID CYST LEFT INDEX FINGER/DEBRIDEMENT LEFT INDEX FINGER;  Surgeon: Wynonia Sours, MD;  Location: [place];   Service: Orthopedics;  Laterality: Left;  ANESTHESIA: IV REGIONAL/FAB  ? SHOULDER ARTHROSCOPY W/ ROTATOR CUFF REPAIR  2007  ? left  ? TEE WITHOUT CARDIOVERSION N/A 11/26/2020  ? Procedure: TRANSESOPHAGEAL ECHOCARDIOGRAM (TEE);  Surgeon: Pixie Casino, MD;  Location: [facility];  Service: Cardiovascular;  Laterality: N/A;  ? TRIGGER FINGER RELEASE Left 11/11/2013  ? Procedure: RELEASE A-1 PULLEY LEFT RING FINGER;  Surgeon: Wynonia Sours, MD;  Location: [place];  Service: Orthopedics;  Laterality: Left;  ? UMBILICAL HERNIA REPAIR  2008, 2010  ? ? ? ?Current Facility-Administered Medications  ?Medication Dose Route Frequency Provider Last Rate Last Admin  ? 0.9 %  sodium chloride infusion   Intravenous Continuous Constance Haw, MD 50 mL/hr at 10/27/21 0300 Continued from Pre-op at 10/27/21 9233  ? ? ?Allergies:   Patient has no known allergies.  ? ?Social History:  The patient  reports that she has never smoked. She has never used smokeless tobacco. She reports current alcohol use of about 3.0 - 4.0 standard drinks per week. She reports that she does not use drugs.  ? ?Family History:  The patient's family history includes Atrial fibrillation in her mother; Diabetes in her maternal grandmother; Heart attack in her brother and mother; Heart disease in her brother; Heart failure in her father and paternal grandfather; Stroke in her paternal grandfather.  ? ?ROS:  Please see the history of present illness.   Otherwise, review of systems is positive for none.   All other systems are reviewed and negative.  ? ?PHYSICAL EXAM: ?VS:  BP (!) 176/90   Pulse (!) 52   Temp 98.4 ?F (36.9 ?C) (Oral)   Resp 18   Ht '5\' 8"'$  (1.727 m)   Wt 86.2 kg   SpO2 96%   BMI 28.89 kg/m?  , BMI Body mass index is 28.89 kg/m?. ?GEN: Well nourished, well developed, in no acute distress  ?HEENT: normal  ?Neck: no JVD, carotid bruits, or masses ?Cardiac: RRR; no murmurs, rubs, or gallops,no edema  ?Respiratory:  clear  to auscultation bilaterally, normal work of breathing ?GI: soft, nontender, nondistended, + BS ?MS: no deformity or atrophy  ?Skin: warm and dry ?Neuro:  Strength and sensation are intact ?Psych: euthymic mood, full affect ? ?Recent Labs: ?06/15/2021: ALT 24; TSH 4.170 ?10/18/2021: BUN 13; Creatinine, Ser 0.77; Hemoglobin 13.0; Platelets 308; Potassium 4.4; Sodium 144  ? ? ?Lipid Panel  ?   ?Component Value Date/Time  ? CHOL 219 (H) 06/15/2021 1019  ? TRIG 168 (H) 06/15/2021 1019  ? HDL 75 06/15/2021 1019  ? CHOLHDL 2.9 06/15/2021 1019  ? LDLCALC 115 (H) 06/15/2021 1019  ? ? ? ?Wt Readings from Last 3 Encounters:  ?10/27/21 86.2 kg  ?09/26/21 81 kg  ?08/29/21 79.8 kg  ?  ? ? ?Other studies Reviewed: ?Additional studies/ records that were reviewed today include: Monitor 01/29/2019 personally reviewed ?Review of the above records today demonstrates:  ?1. atrial fibrillation with good heart rate control ?2.  Rare ventricular ectopy ? ?TTE 11/08/2020 ?Ejection fraction 55 to 60% ?Normal right ventricular size and function ?Left atrium normal in size ?Right atrium normal in size ?Aortic valve  trileaflet structurally normal  ?Normal-appearing mitral valve ? ?Cardiac monitor 05/09/2021 personally reviewed ?Predominant underlying rhythm was sinus rhythm ?Less than 1% ventricular and supraventricular ectopy ?Triggered event associated with sinus rhythm ? ?ASSESSMENT AND PLAN: ? ?1.  Persistent atrial fibrillation/atypical atrial flutter: April Munoz has presented today for surgery, with the diagnosis of AF.  The various methods of treatment have been discussed with the patient and family. After consideration of risks, benefits and other options for treatment, the patient has consented to  Procedure(s): ?Catheter ablation as a surgical intervention .  Risks include but not limited to complete heart block, stroke, esophageal damage, nerve damage, bleeding, vascular damage, tamponade, perforation, MI, and death. The patient's  history has been reviewed, patient examined, no change in status, stable for surgery.  I have reviewed the patient's chart and labs.  Questions were answered to the patient's satisfaction.   ? ?Aerion Bagdasarian Curt Bears, MD ?10/28/18

## 2021-10-27 NOTE — Anesthesia Procedure Notes (Signed)
Procedure Name: Intubation ?Date/Time: 10/27/2021 10:35 AM ?Performed by: Valda Favia, CRNA ?Pre-anesthesia Checklist: Patient identified, Emergency Drugs available, Suction available and Patient being monitored ?Patient Re-evaluated:Patient Re-evaluated prior to induction ?Oxygen Delivery Method: Circle System Utilized ?Preoxygenation: Pre-oxygenation with 100% oxygen ?Induction Type: IV induction ?Ventilation: Mask ventilation without difficulty and Oral airway inserted - appropriate to patient size ?Laryngoscope Size: Mac and 4 ?Grade View: Grade I ?Tube type: Oral ?Tube size: 7.0 mm ?Number of attempts: 1 ?Airway Equipment and Method: Stylet and Oral airway ?Placement Confirmation: ETT inserted through vocal cords under direct vision, positive ETCO2 and breath sounds checked- equal and bilateral ?Secured at: 21 cm ?Tube secured with: Tape ?Dental Injury: Teeth and Oropharynx as per pre-operative assessment  ? ? ? ? ?

## 2021-10-27 NOTE — Progress Notes (Signed)
Writer assessed patients groins and observed a hematoma on right groin. Borders marked. Pressure held for 10 minutes. MD notified. Orders given to have patient remain in bed until MD comes back to see patient again after he finishes another procedure. Hematoma appears to be resolved, patient remains in bed. Awaiting further orders. Will continue to monitor.  ?

## 2021-10-27 NOTE — Anesthesia Preprocedure Evaluation (Signed)
Anesthesia Evaluation  ?Patient identified by MRN, date of birth, ID band ?Patient awake ? ? ? ?Reviewed: ?Allergy & Precautions, NPO status , Patient's Chart, lab work & pertinent test results, reviewed documented beta blocker date and time  ? ?Airway ?Mallampati: II ? ?TM Distance: >3 FB ?Neck ROM: Full ? ? ? Dental ? ?(+) Edentulous Upper, Edentulous Lower ?  ?Pulmonary ?neg pulmonary ROS,  ?  ?Pulmonary exam normal ?breath sounds clear to auscultation ? ? ? ? ? ? Cardiovascular ?hypertension, Pt. on medications and Pt. on home beta blockers ?Normal cardiovascular exam+ dysrhythmias Atrial Fibrillation + Valvular Problems/Murmurs  ?Rhythm:Irregular Rate:Normal ? ?EKG 1/23 ?Atrial flutter with variable block, LAD, non specific ST-T wave abnormalities ? ?Echo 11/26/20 ?1. Left ventricular ejection fraction, by estimation, is 55 to 60%. The left ventricle has normal function.  ??2. Right ventricular systolic function is normal. The right ventricular size is normal.  ??3. Left atrial size was mildly dilated. No left atrial/left atrial  ?appendage thrombus was detected.  ??4. The mitral valve is grossly normal. Trivial mitral valve  ?regurgitation.  ??5. The aortic valve is tricuspid. Aortic valve regurgitation is trivial.  ??6. Agitated saline contrast bubble study was negative, with no evidence of any interatrial shunt.  ?  ?Neuro/Psych ?PSYCHIATRIC DISORDERS Anxiety Depression Peripheral neuropathy ? Neuromuscular disease   ? GI/Hepatic ?Neg liver ROS, GERD  Medicated and Controlled,  ?Endo/Other  ?diabetes, Well Controlled, Type 2, Oral Hypoglycemic AgentsHypothyroidism Hyperlipidemia ? Renal/GU ?Renal disease Bladder dysfunction ? ?SUI ? ?  ?Musculoskeletal ? ?(+) Arthritis , Osteoarthritis,  Fibromyalgia - ? Abdominal ?  ?Peds ? Hematology ?xarelto therapy- last dose yesterday am   ?Anesthesia Other Findings ? ? Reproductive/Obstetrics ? ?  ? ? ? ? ? ? ? ? ? ? ? ? ? ?  ?   ? ? ? ? ? ? ? ? ?Anesthesia Physical ?Anesthesia Plan ? ?ASA: 3 ? ?Anesthesia Plan: General  ? ?Post-op Pain Management:   ? ?Induction: Intravenous ? ?PONV Risk Score and Plan: 3 and Treatment may vary due to age or medical condition and Ondansetron ? ?Airway Management Planned: Oral ETT ? ?Additional Equipment: None ? ?Intra-op Plan:  ? ?Post-operative Plan: Extubation in OR ? ?Informed Consent: I have reviewed the patients History and Physical, chart, labs and discussed the procedure including the risks, benefits and alternatives for the proposed anesthesia with the patient or authorized representative who has indicated his/her understanding and acceptance.  ? ? ? ?Dental advisory given ? ?Plan Discussed with: CRNA and Anesthesiologist ? ?Anesthesia Plan Comments:   ? ? ? ? ? ? ?Anesthesia Quick Evaluation ? ?

## 2021-10-27 NOTE — Transfer of Care (Signed)
Immediate Anesthesia Transfer of Care Note ? ?Patient: April Munoz ? ?Procedure(s) Performed: ATRIAL FIBRILLATION ABLATION ? ?Patient Location: Cath Lab ? ?Anesthesia Type:General ? ?Level of Consciousness: awake, alert  and oriented ? ?Airway & Oxygen Therapy: Patient Spontanous Breathing and Patient connected to nasal cannula oxygen ? ?Post-op Assessment: Report given to RN and Post -op Vital signs reviewed and stable ? ?Post vital signs: Reviewed and stable ? ?Last Vitals:  ?Vitals Value Taken Time  ?BP 146/115 10/27/21 1254  ?Temp    ?Pulse 82 10/27/21 1258  ?Resp 19 10/27/21 1258  ?SpO2 94 % 10/27/21 1258  ?Vitals shown include unvalidated device data. ? ?Last Pain:  ?Vitals:  ? 10/27/21 1241  ?TempSrc:   ?PainSc: 0-No pain  ?   ? ?  ? ?Complications: There were no known notable events for this encounter. ?

## 2021-10-28 ENCOUNTER — Encounter (HOSPITAL_COMMUNITY): Payer: Self-pay | Admitting: Cardiology

## 2021-10-28 MED FILL — Heparin Sod (Porcine)-NaCl IV Soln 1000 Unit/500ML-0.9%: INTRAVENOUS | Qty: 500 | Status: AC

## 2021-11-03 ENCOUNTER — Telehealth: Payer: Self-pay

## 2021-11-03 ENCOUNTER — Other Ambulatory Visit: Payer: Self-pay

## 2021-11-03 MED ORDER — METFORMIN HCL 1000 MG PO TABS: ORAL_TABLET | ORAL | 0 refills | Status: DC

## 2021-11-03 NOTE — Chronic Care Management (AMB) (Signed)
? ? ?Chronic Care Management ?Pharmacy Assistant  ? ?Name: April Munoz  MRN: [id number] DOB: Mar 01, 1951 ? ?Reason for Encounter: Medication Review ?  ?11/03/2021- Patient called needing refill of Metformin, refill request sent to [facility]. Acute fill form sent to Pharmacy tech to delivery to patient tomorrow. Called patient to inform, she is aware [facility] will deliver medication tomorrow.  ? ?Medications: ?Outpatient Encounter Medications as of 11/03/2021  ?Medication Sig  ? ACIDOPHILUS LACTOBACILLUS PO Take 1 capsule by mouth daily.  ? acyclovir (ZOVIRAX) 800 MG tablet TAKE 1 TABLET BY MOUTH FOUR TIMES A DAY (Patient taking differently: 800 mg 4 (four) times daily as needed (shingles,).)  ? APPLE CIDER VINEGAR PO Take 480 mg by mouth daily.  ? atorvastatin (LIPITOR) 20 MG tablet Take 1 tablet (20 mg total) by mouth daily.  ? B Complex-C-Folic Acid TABS Take 1 tablet by mouth daily.  ? Cholecalciferol (VITAMIN D) 50 MCG (2000 UT) tablet Take 2,000 Units by mouth daily.  ? colchicine 0.6 MG tablet Take 1 tablet (0.6 mg total) by mouth 2 (two) times daily.  ? Continuous Blood Gluc Receiver (FREESTYLE LIBRE 2 READER) DEVI E11.21 Check blood sugar 4 times daily as directed  ? Continuous Blood Gluc Sensor (FREESTYLE LIBRE 2 SENSOR) MISC E11.21 Change sensor every 14 days as directed  ? cyanocobalamin (,VITAMIN B-12,) 1000 MCG/ML injection INJECT 1 ML INTO THE MUSCLE EVERY 30 DAYS (Patient taking differently: Inject 1,000 mcg into the muscle every 30 (thirty) days.)  ? dapagliflozin propanediol (FARXIGA) 10 MG TABS tablet Take 1 tablet (10 mg total) by mouth daily.  ? Dulaglutide (TRULICITY) 3.64 WO/0.3OZ SOPN Inject 0.75 mg into the skin once a week. Friday  ? EPINEPHrine 0.3 mg/0.3 mL IJ SOAJ injection Inject 0.3 mg into the muscle as needed for anaphylaxis.  ? ibuprofen (ADVIL) 200 MG tablet Take 400 mg by mouth every 6 (six) hours as needed for headache.  ? metFORMIN (GLUCOPHAGE) 1000 MG tablet TAKE ONE TABLET  TWICE DAILY WITH MEALS (Patient taking differently: Take 1,000 mg by mouth 2 (two) times daily with a meal.)  ? metoprolol succinate (TOPROL-XL) 25 MG 24 hr tablet Take 1 tablet (25 mg total) by mouth in the morning and at bedtime.  ? Multiple Vitamin (MULTIVITAMIN WITH MINERALS) TABS tablet Take 1 tablet by mouth daily.  ? OLANZapine (ZYPREXA) 2.5 MG tablet TAKE 1 TABLET BY MOUTH EVERYDAY AT BEDTIME (Patient taking differently: Take 2.5 mg by mouth at bedtime.)  ? omeprazole (PRILOSEC) 20 MG capsule TAKE ONE (1) CAPSULE EACH DAY Strength: 20 mg (Patient taking differently: Take 20 mg by mouth at bedtime.)  ? Probiotic Product (PROBIOTIC & ACIDOPHILUS EX ST PO) Take 1 tablet by mouth daily.   ? rivaroxaban (XARELTO) 20 MG TABS tablet TAKE 1 TABLET BY MOUTH EVERY DAY WITH SUPPER (Patient taking differently: Take 20 mg by mouth daily with supper.)  ? solifenacin (VESICARE) 5 MG tablet TAKE 1 TABLET BY MOUTH EVERY DAY (Patient taking differently: Take 5 mg by mouth daily.)  ? torsemide (DEMADEX) 20 MG tablet Take 1 tablet (20 mg total) by mouth 3 (three) times a week. Please take this on Monday, Wednesday, and Friday.  ? Turmeric 500 MG CAPS Take 500 mg by mouth daily.  ? venlafaxine XR (EFFEXOR-XR) 75 MG 24 hr capsule Take 2 capsules daily  ?Strength: 75 mg  ? vitamin B-12 (CYANOCOBALAMIN) 1000 MCG tablet Take 1 tablet (1,000 mcg total) by mouth daily.  ? Vitamin D, Ergocalciferol, (DRISDOL) 1.25  MG (50000 UNIT) CAPS capsule Take 1 capsule (50,000 Units total) by mouth every 7 (seven) days. Monday  ? zinc gluconate 50 MG tablet Take 50 mg by mouth daily.  ? ?No facility-administered encounter medications on file as of 11/03/2021.  ? ? ?Pattricia Boss, CMA ?Clinical Pharmacist Assistant ?[phone] ? ?

## 2021-11-04 ENCOUNTER — Other Ambulatory Visit: Payer: Self-pay

## 2021-11-04 MED ORDER — METFORMIN HCL 1000 MG PO TABS: ORAL_TABLET | ORAL | 0 refills | Status: DC

## 2021-11-04 NOTE — Telephone Encounter (Signed)
April Munoz requested refill be sent on behalf of patient. April Munoz was informed patient needs to make follow up appointment as she was due in February.  ? ?Royce Macadamia, [place] 11/04/21 10:09 AM ? ?

## 2021-11-13 DIAGNOSIS — I4811 Longstanding persistent atrial fibrillation: Secondary | ICD-10-CM

## 2021-11-13 DIAGNOSIS — F331 Major depressive disorder, recurrent, moderate: Secondary | ICD-10-CM

## 2021-11-13 DIAGNOSIS — E782 Mixed hyperlipidemia: Secondary | ICD-10-CM

## 2021-11-24 ENCOUNTER — Ambulatory Visit (HOSPITAL_COMMUNITY): Payer: 59 | Admitting: Nurse Practitioner

## 2021-11-25 DIAGNOSIS — T148XXA Other injury of unspecified body region, initial encounter: Secondary | ICD-10-CM | POA: Diagnosis not present

## 2021-11-25 DIAGNOSIS — S0990XA Unspecified injury of head, initial encounter: Secondary | ICD-10-CM | POA: Diagnosis not present

## 2021-11-25 DIAGNOSIS — G319 Degenerative disease of nervous system, unspecified: Secondary | ICD-10-CM | POA: Diagnosis not present

## 2021-11-25 DIAGNOSIS — I739 Peripheral vascular disease, unspecified: Secondary | ICD-10-CM | POA: Diagnosis not present

## 2021-11-25 DIAGNOSIS — W19XXXA Unspecified fall, initial encounter: Secondary | ICD-10-CM | POA: Diagnosis not present

## 2021-11-25 DIAGNOSIS — M5030 Other cervical disc degeneration, unspecified cervical region: Secondary | ICD-10-CM | POA: Diagnosis not present

## 2021-11-25 DIAGNOSIS — S0003XA Contusion of scalp, initial encounter: Secondary | ICD-10-CM | POA: Diagnosis not present

## 2021-12-06 ENCOUNTER — Telehealth: Payer: Self-pay | Admitting: Neurology

## 2021-12-06 NOTE — Telephone Encounter (Signed)
Resent referral for Psychology sent to [facility] [phone].

## 2021-12-15 NOTE — Telephone Encounter (Signed)
Patient refused appointment due to cost, [facility] is not in network; encouraged client to contact her ins co.

## 2021-12-26 ENCOUNTER — Other Ambulatory Visit: Payer: Self-pay | Admitting: Physician Assistant

## 2021-12-26 ENCOUNTER — Other Ambulatory Visit: Payer: Self-pay | Admitting: Family Medicine

## 2021-12-26 DIAGNOSIS — E559 Vitamin D deficiency, unspecified: Secondary | ICD-10-CM

## 2021-12-26 DIAGNOSIS — N3941 Urge incontinence: Secondary | ICD-10-CM

## 2021-12-26 DIAGNOSIS — E538 Deficiency of other specified B group vitamins: Secondary | ICD-10-CM

## 2021-12-26 NOTE — Telephone Encounter (Signed)
Please call for fasting appt. Dr Tobie Poet

## 2022-01-02 ENCOUNTER — Other Ambulatory Visit: Payer: Self-pay | Admitting: Family Medicine

## 2022-01-02 ENCOUNTER — Telehealth: Payer: Self-pay

## 2022-01-02 NOTE — Telephone Encounter (Signed)
Compliant on meds 

## 2022-01-02 NOTE — Progress Notes (Signed)
Chronic Care Management Pharmacy Assistant   Name: April Munoz  MRN: [id number] DOB: May 28, 1951   Reason for Encounter: Medication Coordination for [facility]    Recent office visits:  None  Recent consult visits:  None  Hospital visits:  Medication Reconciliation was completed by comparing discharge summary, patient's EMR and Pharmacy list, and upon discussion with patient.  Admitted to the hospital on 10/27/21 due to Atrial Fibrillation. Discharge date was 10/27/21. Discharged from [place]?Medications Started at [facility] Discharge:?? -started colchicine 0.'6mg'$    -All other medications will remain the same.    Medications: Outpatient Encounter Medications as of 01/02/2022  Medication Sig   ACIDOPHILUS LACTOBACILLUS PO Take 1 capsule by mouth daily.   acyclovir (ZOVIRAX) 800 MG tablet TAKE 1 TABLET BY MOUTH FOUR TIMES A DAY (Patient taking differently: 800 mg 4 (four) times daily as needed (shingles,).)   APPLE CIDER VINEGAR PO Take 480 mg by mouth daily.   atorvastatin (LIPITOR) 20 MG tablet TAKE 1 TABLET BY MOUTH DAILY   B Complex-C-Folic Acid TABS Take 1 tablet by mouth daily.   Cholecalciferol (VITAMIN D) 50 MCG (2000 UT) tablet Take 2,000 Units by mouth daily.   colchicine 0.6 MG tablet Take 1 tablet (0.6 mg total) by mouth 2 (two) times daily.   Continuous Blood Gluc Receiver (FREESTYLE LIBRE 2 READER) DEVI E11.21 Check blood sugar 4 times daily as directed   Continuous Blood Gluc Sensor (FREESTYLE LIBRE 2 SENSOR) MISC E11.21 Change sensor every 14 days as directed   cyanocobalamin (,VITAMIN B-12,) 1000 MCG/ML injection INJECT 1 ML INTO THE MUSCLE EVERY 30 DAYS   dapagliflozin propanediol (FARXIGA) 10 MG TABS tablet Take 1 tablet (10 mg total) by mouth daily.   Dulaglutide (TRULICITY) 4.70 JG/2.8ZM SOPN Inject 0.75 mg into the skin once a week. Friday   EPINEPHrine 0.3 mg/0.3 mL IJ SOAJ injection Inject 0.3 mg into the muscle as needed for  anaphylaxis.   ibuprofen (ADVIL) 200 MG tablet Take 400 mg by mouth every 6 (six) hours as needed for headache.   metFORMIN (GLUCOPHAGE) 1000 MG tablet TAKE ONE TABLET TWICE DAILY WITH MEALS   metoprolol succinate (TOPROL-XL) 25 MG 24 hr tablet Take 1 tablet (25 mg total) by mouth in the morning and at bedtime.   Multiple Vitamin (MULTIVITAMIN WITH MINERALS) TABS tablet Take 1 tablet by mouth daily.   OLANZapine (ZYPREXA) 2.5 MG tablet Take 1 tablet (2.5 mg total) by mouth at bedtime.   omeprazole (PRILOSEC) 20 MG capsule Take 1 capsule (20 mg total) by mouth at bedtime.   Probiotic Product (PROBIOTIC & ACIDOPHILUS EX ST PO) Take 1 tablet by mouth daily.    rivaroxaban (XARELTO) 20 MG TABS tablet TAKE 1 TABLET BY MOUTH EVERY DAY WITH SUPPER (Patient taking differently: Take 20 mg by mouth daily with supper.)   solifenacin (VESICARE) 5 MG tablet TAKE 1 TABLET BY MOUTH EVERY DAY   torsemide (DEMADEX) 20 MG tablet Take 1 tablet (20 mg total) by mouth 3 (three) times a week. Please take this on Monday, Wednesday, and Friday.   Turmeric 500 MG CAPS Take 500 mg by mouth daily.   venlafaxine XR (EFFEXOR-XR) 75 MG 24 hr capsule Take 2 capsules daily  Strength: 75 mg   vitamin B-12 (CYANOCOBALAMIN) 1000 MCG tablet Take 1 tablet (1,000 mcg total) by mouth daily.   Vitamin D, Ergocalciferol, (DRISDOL) 1.25 MG (50000 UNIT) CAPS capsule TAKE 1 CAPSULE BY MOUTH EVERY MONDAY   zinc  gluconate 50 MG tablet Take 50 mg by mouth daily.   No facility-administered encounter medications on file as of 01/02/2022.    Reviewed chart for medication changes ahead of medication coordination call.  No OVs, Consults visits since last care coordination call/Pharmacist visit.    BP Readings from Last 3 Encounters:  10/27/21 137/80  10/20/21 (!) 152/74  09/26/21 136/68    Lab Results  Component Value Date   HGBA1C 6.1 (H) 06/15/2021     Patient obtains medications through Adherence Packaging  90 Days   Patient is  due for first adherence delivery on: 01/12/22. Called patient and reviewed medications and coordinated delivery.  This delivery to include: Atorvastatin '20mg'$  1 EM Metformin '1000mg'$  1 EM Metoprolol Succ ER '50mg'$  1 BT Womens Multivtiamin 1 B  Olanzapine 2.'5mg'$  1 BT Omeprazole '20mg'$  1 B Solifenacin '5mg'$  1 B Venlafaxine ER '150mg'$  1 B Vitamin D2 1226mg 1 B on Mondays Xarelto '20mg'$  1 B Farxiga '10mg'$   1 B Trulicity 0.'75mg'$ /0.565mInject 0.'75mg'$  once a week on Thursday Epinephrine 0.'3mg'$  Injecdt 0.'3mg'$  into the muscle prn   Patient declined the following medications  cyanocobalamin (vit b12) inj and torsemide filled 12/13/21 for 90DS [place] 0.'5mg'$ - Temporary fill on 4/23  Acute Fill/Short Fill  Metformin '1000mg'$  needs on 01/03/22  Patient needs refills  None  Confirmed delivery date of 01/12/22, advised patient that pharmacy will contact them the morning of delivery.  DaElray McgregorCMColumbusharmacist Assistant  [phone]

## 2022-01-06 ENCOUNTER — Other Ambulatory Visit: Payer: Self-pay | Admitting: Family Medicine

## 2022-01-09 ENCOUNTER — Other Ambulatory Visit: Payer: Self-pay

## 2022-01-09 DIAGNOSIS — N3941 Urge incontinence: Secondary | ICD-10-CM

## 2022-01-09 DIAGNOSIS — F419 Anxiety disorder, unspecified: Secondary | ICD-10-CM

## 2022-01-09 DIAGNOSIS — F322 Major depressive disorder, single episode, severe without psychotic features: Secondary | ICD-10-CM

## 2022-01-09 MED ORDER — VENLAFAXINE HCL ER 75 MG PO CP24: ORAL_CAPSULE | ORAL | 0 refills | Status: DC

## 2022-01-09 MED ORDER — SOLIFENACIN SUCCINATE 5 MG PO TABS: 5.0000 mg | ORAL_TABLET | Freq: Every day | ORAL | 0 refills | Status: DC

## 2022-01-11 ENCOUNTER — Other Ambulatory Visit: Payer: Self-pay

## 2022-01-11 DIAGNOSIS — F322 Major depressive disorder, single episode, severe without psychotic features: Secondary | ICD-10-CM

## 2022-01-11 DIAGNOSIS — F419 Anxiety disorder, unspecified: Secondary | ICD-10-CM

## 2022-01-11 MED ORDER — VENLAFAXINE HCL ER 75 MG PO CP24: ORAL_CAPSULE | ORAL | 0 refills | Status: DC

## 2022-01-11 NOTE — Progress Notes (Signed)
After speaking to the pharmacy, pt had picked up several prescription recently from [facility]. I had to inform pt that we would not be able to send the SOLIFENACIN 5 MG TABLET 12/26/2021 90ds, Omeprazole and B12. I told pt that we transferred all prescriptions from [facility] now and that she needed to call and cancel all automatic refills and not to pick them up there anymore if she wants [place] to continue filling them. Pt agreed and understood. DG

## 2022-02-06 ENCOUNTER — Other Ambulatory Visit: Payer: Self-pay

## 2022-02-06 MED ORDER — METFORMIN HCL 1000 MG PO TABS: 1000.0000 mg | ORAL_TABLET | Freq: Two times a day (BID) | ORAL | 0 refills | Status: DC

## 2022-02-08 ENCOUNTER — Ambulatory Visit (INDEPENDENT_AMBULATORY_CARE_PROVIDER_SITE_OTHER): Payer: PPO | Admitting: Family Medicine

## 2022-02-08 VITALS — BP 152/98 | HR 102 | Resp 18 | Ht 67.0 in | Wt 173.6 lb

## 2022-02-08 DIAGNOSIS — E782 Mixed hyperlipidemia: Secondary | ICD-10-CM | POA: Diagnosis not present

## 2022-02-08 DIAGNOSIS — R2689 Other abnormalities of gait and mobility: Secondary | ICD-10-CM

## 2022-02-08 DIAGNOSIS — R400 Somnolence: Secondary | ICD-10-CM

## 2022-02-08 DIAGNOSIS — E559 Vitamin D deficiency, unspecified: Secondary | ICD-10-CM | POA: Diagnosis not present

## 2022-02-08 DIAGNOSIS — R413 Other amnesia: Secondary | ICD-10-CM

## 2022-02-08 DIAGNOSIS — E1142 Type 2 diabetes mellitus with diabetic polyneuropathy: Secondary | ICD-10-CM

## 2022-02-08 DIAGNOSIS — I4891 Unspecified atrial fibrillation: Secondary | ICD-10-CM | POA: Diagnosis not present

## 2022-02-08 DIAGNOSIS — I110 Hypertensive heart disease with heart failure: Secondary | ICD-10-CM | POA: Diagnosis not present

## 2022-02-08 DIAGNOSIS — F5101 Primary insomnia: Secondary | ICD-10-CM | POA: Diagnosis not present

## 2022-02-08 DIAGNOSIS — D6869 Other thrombophilia: Secondary | ICD-10-CM | POA: Diagnosis not present

## 2022-02-08 DIAGNOSIS — F331 Major depressive disorder, recurrent, moderate: Secondary | ICD-10-CM | POA: Diagnosis not present

## 2022-02-08 MED ORDER — OLANZAPINE 5 MG PO TABS: 5.0000 mg | ORAL_TABLET | Freq: Every day | ORAL | 1 refills | Status: DC

## 2022-02-08 NOTE — Progress Notes (Signed)
Subjective:  Patient ID: April Munoz, female    DOB: 05-Apr-1951  Age: 71 y.o. MRN: [id number]  Chief Complaint  Patient presents with   Diabetes    HPI Diabetes: Patient is currently taking Farxiga 10 mg daily, Metformin 1000 mg twice a day. Has 4 more shots of trulicity 6.23 mg weekly, but is excessively expensive and cannot afford to take it.  Sugars 100-140. Marland Kitchen   Checks feet daily.  Gets annual eye exams.   Hyperlipidemia:Patient is off all cholesterol medicines. Patient does not know why.   Hypertension:  Currently taking Losartan 50 mg daily. Toprol xl 25 mg twice daily.   Afib: S/P cardiac ablation x 3. No palpitations since recent ablation. Xarelto 20 mg daily, metoprolol 50 mg daily.  Depression: Effexor xr 150 mg daily. I added olanzepine 2.5 mg before bed. Helps briefly, but than returns.      02/20/2022    4:10 PM 02/08/2022    8:44 AM 06/19/2021    8:54 PM  PHQ9 SCORE ONLY  PHQ-9 Total Score '22 22 15     '$ Current Outpatient Medications on File Prior to Visit  Medication Sig Dispense Refill   ACIDOPHILUS LACTOBACILLUS PO Take 1 capsule by mouth daily.     acyclovir (ZOVIRAX) 800 MG tablet TAKE 1 TABLET BY MOUTH FOUR TIMES A DAY (Patient taking differently: 800 mg 4 (four) times daily as needed (shingles,).) 120 tablet 0   APPLE CIDER VINEGAR PO Take 480 mg by mouth daily.     B Complex-C-Folic Acid TABS Take 1 tablet by mouth daily.     cyanocobalamin (,VITAMIN B-12,) 1000 MCG/ML injection INJECT 1 ML INTO THE MUSCLE EVERY 30 DAYS 3 mL 3   dapagliflozin propanediol (FARXIGA) 10 MG TABS tablet Take 1 tablet (10 mg total) by mouth daily. 90 tablet 0   Dulaglutide (TRULICITY) 7.62 GB/1.5VV SOPN Inject 0.75 mg into the skin once a week. Friday 6 mL 0   EPINEPHrine 0.3 mg/0.3 mL IJ SOAJ injection Inject 0.3 mg into the muscle as needed for anaphylaxis. 1 each 1   ibuprofen (ADVIL) 200 MG tablet Take 400 mg by mouth every 6 (six) hours as needed for headache.     metFORMIN  (GLUCOPHAGE) 1000 MG tablet Take 1 tablet (1,000 mg total) by mouth 2 (two) times daily with a meal. 60 tablet 0   metoprolol succinate (TOPROL-XL) 25 MG 24 hr tablet Take 1 tablet (25 mg total) by mouth in the morning and at bedtime. (Patient taking differently: Take 25 mg by mouth in the morning and at bedtime. Taking 2 QD per Cardio per patient) 180 tablet 0   Multiple Vitamin (MULTIVITAMIN WITH MINERALS) TABS tablet Take 1 tablet by mouth daily.     omeprazole (PRILOSEC) 20 MG capsule Take 1 capsule (20 mg total) by mouth at bedtime. 90 capsule 1   Probiotic Product (PROBIOTIC & ACIDOPHILUS EX ST PO) Take 1 tablet by mouth daily.      rivaroxaban (XARELTO) 20 MG TABS tablet TAKE 1 TABLET BY MOUTH EVERY DAY WITH SUPPER (Patient taking differently: Take 20 mg by mouth daily with supper.) 30 tablet 3   solifenacin (VESICARE) 5 MG tablet Take 1 tablet (5 mg total) by mouth daily. 90 tablet 0   torsemide (DEMADEX) 20 MG tablet Take 1 tablet (20 mg total) by mouth 3 (three) times a week. Please take this on Monday, Wednesday, and Friday. 90 tablet 0   venlafaxine XR (EFFEXOR-XR) 75 MG 24 hr capsule Take  2 capsules daily Strength: 75 mg 180 capsule 0   zinc gluconate 50 MG tablet Take 50 mg by mouth daily.     No current facility-administered medications on file prior to visit.   Past Medical History:  Diagnosis Date   Anxiety    Arthritis    Ataxic gait    Atrial fibrillation (HCC)    Atrophy of thyroid    B12 deficiency 10/31/2017   Cellulitis of abdominal wall 07/07/2016   Chronic anticoagulation 05/31/2018   Depression    Depression, major, recurrent, mild ([place]) 09/16/2019   Diabetes mellitus without complication ([place])    Diabetic glomerulopathy ([place]) 09/16/2019   Esophageal stricture 06/05/2017   Essential hypertension 02/24/2013   Fatigue 01/20/2020   Fibromyalgia 02/24/2013   Full dentures    Hereditary and idiopathic peripheral neuropathy 02/24/2013   High risk medication use 11/07/2017    Hyperlipidemia 02/24/2013   Hypertension    Hypertensive heart disease 02/24/2013   Incontinence    Insomnia    Longstanding persistent atrial fibrillation ([place]) 09/16/2019   Mild vitamin D deficiency 09/16/2019   Mixed hyperlipidemia 02/24/2013   Murmur, cardiac 08/15/2015   Neuropathy in diabetes ([place]) 10/31/2017   Osteoporosis    Other amnesia    Other amnesia    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    Paroxysmal atrial fibrillation ([place]) 08/15/2015   CHADS2vasc=3 CHADS2vasc=3   Primary insomnia    Restless legs syndrome 03/14/2013   Secondary hypothyroidism 09/16/2019   Thoracic or lumbosacral neuritis or radiculitis 02/24/2013   Type 2 diabetes mellitus, without long-term current use of insulin ([place]) 02/24/2013   Urge incontinence of urine 09/16/2019   Wears glasses    Past Surgical History:  Procedure Laterality Date   A-FLUTTER ABLATION N/A 07/30/2019   Procedure: A-FLUTTER ABLATION;  Surgeon: Constance Haw, MD;  Location: [facility];  Service: Cardiovascular;  Laterality: N/A;   ATRIAL FIBRILLATION ABLATION N/A 03/21/2019   Procedure: ATRIAL FIBRILLATION ABLATION;  Surgeon: Constance Haw, MD;  Location: [facility];  Service: Cardiovascular;  Laterality: N/A;   ATRIAL FIBRILLATION ABLATION N/A 10/27/2021   Procedure: ATRIAL FIBRILLATION ABLATION;  Surgeon: Constance Haw, MD;  Location: [facility];  Service: Cardiovascular;  Laterality: N/A;   [place] STUDY  11/26/2020   Procedure: BUBBLE STUDY;  Surgeon: Pixie Casino, MD;  Location: [facility] END[facility];  Service: Cardiovascular;;   CARDIOVERSION N/A 11/26/2020   Procedure: CARDIOVERSION;  Surgeon: Pixie Casino, MD;  Location: [facility] ENDOSCOPY;  Service: Cardiovascular;  Laterality: N/A;   CATARACT EXTRACTION, BILATERAL  2018, 2019   CHOLECYSTECTOMY  1971   open   DIAGNOSTIC LAPAROSCOPY  2010   lysis of adhesions   DILATION AND CURETTAGE OF  UTERUS  2004   KNEE ARTHROSCOPY  1998   left   KNEE ARTHROSCOPY W/ LATERAL RETINACULAR REPAIR     MASS EXCISION Left 11/11/2013   Procedure: EXCISION MUCOID CYST LEFT INDEX FINGER/DEBRIDEMENT LEFT INDEX FINGER;  Surgeon: Wynonia Sours, MD;  Location: [facility];  Service: Orthopedics;  Laterality: Left;  ANESTHESIA: IV REGIONAL/FAB   SHOULDER ARTHROSCOPY W/ ROTATOR CUFF REPAIR  2007   left   TEE WITHOUT CARDIOVERSION N/A 11/26/2020   Procedure: TRANSESOPHAGEAL ECHOCARDIOGRAM (TEE);  Surgeon: Pixie Casino, MD;  Location: [facility];  Service: Cardiovascular;  Laterality: N/A;   TRIGGER FINGER RELEASE Left 11/11/2013   Procedure: RELEASE A-1 PULLEY LEFT RING  FINGER;  Surgeon: Wynonia Sours, MD;  Location: [facility];  Service: Orthopedics;  Laterality: Left;   UMBILICAL HERNIA REPAIR  2008, 2010    Family History  Problem Relation Age of Onset   Atrial fibrillation Mother    Heart attack Mother    Heart failure Father    Heart disease Brother    Heart attack Brother    Bladder Cancer Brother    Diabetes Maternal Grandmother    Heart failure Paternal Grandfather    Stroke Paternal Grandfather    Social History   Socioeconomic History   Marital status: Married    Spouse name: Liliane Channel   Number of children: 2   Years of education: Not on file   Highest education level: Some college, no degree  Occupational History   Occupation: retired  Tobacco Use   Smoking status: Never   Smokeless tobacco: Never  Vaping Use   Vaping Use: Never used  Substance and Sexual Activity   Alcohol use: Yes    Alcohol/week: 3.0 - 4.0 standard drinks of alcohol    Types: 1 - 2 Glasses of wine, 2 Shots of liquor per week    Comment: 4 times a week   Drug use: No   Sexual activity: Not on file  Other Topics Concern   Not on file  Social History Narrative   Lives at home with husband - one daughter that lives in [place] (adopted) and one son (adopted)   Left handed    Caffeine: caffeine free drinks. 2 cup of coffee a day   Social Determinants of Health   Financial Resource Strain: High Risk (07/04/2021)   Overall Financial Resource Strain (CARDIA)    Difficulty of Paying Living Expenses: Hard  Food Insecurity: No Food Insecurity (12/02/2020)   Hunger Vital Sign    Worried About Running Out of Food in the Last Year: Never true    Ran Out of Food in the Last Year: Never true  Transportation Needs: No Transportation Needs (07/04/2021)   PRAPARE - Hydrologist (Medical): No    Lack of Transportation (Non-Medical): No  Physical Activity: Not on file  Stress: Not on file  Social Connections: Not on file    Review of Systems  Constitutional:  Negative for chills, fatigue and fever.  HENT:  Negative for congestion, ear pain and sore throat.   Respiratory:  Negative for cough and shortness of breath.   Cardiovascular:  Negative for chest pain.  Gastrointestinal:  Negative for abdominal pain, constipation, diarrhea, nausea and vomiting.  Genitourinary:  Negative for dysuria and urgency.  Musculoskeletal:  Negative for arthralgias and myalgias.  Skin:  Negative for rash.  Neurological:  Negative for dizziness and headaches.  Psychiatric/Behavioral:  Positive for confusion and dysphoric mood. The patient is nervous/anxious.      Objective:  BP (!) 152/98   Pulse (!) 102   Resp 18   Ht '5\' 7"'$  (1.702 m)   Wt 173 lb 9.6 oz (78.7 kg)   SpO2 97%   BMI 27.19 kg/m      02/20/2022    4:03 PM 02/20/2022   10:30 AM 02/08/2022    7:55 AM  BP/Weight  Systolic BP 474 259 563  Diastolic BP 78 82 98  Wt. (Lbs) 180.4 179.4 173.6  BMI 28.25 kg/m2 28.1 kg/m2 27.19 kg/m2    Physical Exam Vitals reviewed.  Constitutional:      Appearance: Normal appearance. She is normal weight.  Neck:     Vascular: No carotid bruit.  Cardiovascular:     Rate and Rhythm: Normal rate and regular rhythm.     Heart sounds: Normal heart sounds.   Pulmonary:     Effort: Pulmonary effort is normal. No respiratory distress.     Breath sounds: Normal breath sounds.  Abdominal:     General: Abdomen is flat. Bowel sounds are normal.     Palpations: Abdomen is soft.     Tenderness: There is no abdominal tenderness.  Neurological:     Mental Status: She is alert.  Psychiatric:        Mood and Affect: Mood normal.        Behavior: Behavior normal.       02/20/2022    4:12 PM 02/08/2022    8:55 AM  MMSE - Mini Mental State Exam  Orientation to time 3 3  Orientation to Place 5 4  Registration 3 3  Attention/ Calculation 3 5  Recall 2 3  Language- name 2 objects 2 2  Language- repeat 1 1  Language- follow 3 step command 3 3  Language- read & follow direction 1 1  Write a sentence 1 1  Copy design 1 1  Total score 25 27     Diabetic Foot Exam - Simple   No data filed      Lab Results  Component Value Date   WBC 7.4 02/08/2022   HGB 14.4 02/08/2022   HCT 43.7 02/08/2022   PLT 344 02/08/2022   GLUCOSE 125 (H) 02/08/2022   CHOL 187 02/08/2022   TRIG 126 02/08/2022   HDL 87 02/08/2022   LDLCALC 79 02/08/2022   ALT 30 02/08/2022   AST 29 02/08/2022   NA 145 (H) 02/08/2022   K 4.4 02/08/2022   CL 102 02/08/2022   CREATININE 1.00 02/08/2022   BUN 12 02/08/2022   CO2 27 02/08/2022   TSH 5.700 (H) 02/08/2022   HGBA1C 6.4 (H) 02/08/2022   MICROALBUR 80 03/15/2021      Assessment & Plan:   Problem List Items Addressed This Visit       Cardiovascular and Mediastinum   Hypertensive heart disease    Not quite at goal. Continue Losartan 50 mg daily. Toprol xl 25 mg twice daily.  Continue to work on eating a healthy diet and exercise.  Labs drawn today.        Relevant Orders   CBC with Differential/Platelet (Completed)   Comprehensive metabolic panel (Completed)   T4, free (Completed)   TSH (Completed)   Atrial fibrillation (HCC)   Relevant Orders   Home sleep test     Endocrine   Neuropathy in  diabetes ([place])    Control: at goal Recommend check sugars fasting daily. Recommend check feet daily. Recommend annual eye exams. Medicines: Discontinue Trulicity after runs out.  Continue other medication Continue to work on eating a healthy diet and exercise.  Labs drawn today.         Relevant Orders   Hemoglobin A1c (Completed)   B12 and Folate Panel (Completed)   Methylmalonic acid, serum (Completed)   Microalbumin / creatinine urine ratio (Completed)     Hematopoietic and Hemostatic   Acquired thrombophilia ([place])    On xarelto for atrial fibrillation.         Other   Mixed hyperlipidemia    Well controlled.  No changes to medicines. Patient needs to be taking rosuvastatin 10 mg before bed.  Continue to  work on eating a healthy diet and exercise.  Labs drawn today.        Relevant Orders   Lipid panel (Completed)   Major depressive disorder, recurrent episode, moderate (HCC)    Change Effexor XR to 75 mg 2 in AM.  Increase olanzapine to 5 mg nightly.  Recommend speak with counselor at hospice.  I believe they have a caregiver program.      Vitamin D insufficiency - Primary   Relevant Orders   VITAMIN D 25 Hydroxy (Vit-D Deficiency, Fractures) (Completed)   Memory loss    Ordering an MRI of her brain.      Relevant Orders   MR Brain W Wo Contrast (Completed)   Primary insomnia   Relevant Orders   Home sleep test   Imbalance   Relevant Orders   MR Brain W Wo Contrast (Completed)   Somnolence    Ordering a sleep study at home.      Relevant Orders   Home sleep test  .  Meds ordered this encounter  Medications   OLANZapine (ZYPREXA) 5 MG tablet    Sig: Take 1 tablet (5 mg total) by mouth at bedtime.    Dispense:  30 tablet    Refill:  1    Orders Placed This Encounter  Procedures   MR Brain W Wo Contrast   CBC with Differential/Platelet   Comprehensive metabolic panel   Hemoglobin A1c   Lipid panel   T4, free   TSH   B12 and Folate Panel    Methylmalonic acid, serum   VITAMIN D 25 Hydroxy (Vit-D Deficiency, Fractures)   Microalbumin / creatinine urine ratio   Home sleep test     Follow-up: Return in about 4 weeks (around 03/08/2022) for chronic follow up, 40 minutes please, awv with KIm this month. , chronic fasting in 3 months. .  An After Visit Summary was printed and given to the patient.  [name], MD [facility] [phone]

## 2022-02-08 NOTE — Patient Instructions (Addendum)
Diabetes: Discontinue Trulicity after runs out.  Continue other medication  Depression: Change Effexor XR to 75 mg 2 in AM.  Increase olanzapine to 5 mg nightly.  Recommend speak with counselor at hospice.  I believe they have a caregiver program.  Sleep issues: Ordering a sleep study at home.  Memory issues: Ordering an MRI of her brain.  Needs to return for her annual wellness visit with Shelle Iron, LPN. Patient needs to follow-up with me in 4 weeks.

## 2022-02-08 NOTE — Progress Notes (Signed)
duplicate

## 2022-02-09 ENCOUNTER — Telehealth: Payer: Self-pay

## 2022-02-09 NOTE — Progress Notes (Signed)
I called pt today and left a vm to call me back if she wanted to start the Xarelto '20mg'$   PAP. I have started this application and it has been uploaded to be mailed out to pt if she would like to start this. We will await a call back from pt.  Elray Mcgregor, [place] Pharmacist Assistant  [phone]

## 2022-02-09 NOTE — Telephone Encounter (Signed)
PCP would like Trulicity and Rivaroxaban PAP. Looks like Trulicity PAP was approved last year. Coordinated with team to determine if was renewed this year but patient just never scheduled refill

## 2022-02-10 ENCOUNTER — Telehealth: Payer: Self-pay

## 2022-02-10 NOTE — Chronic Care Management (AMB) (Signed)
Patient assistance application filled out for Ozempic 0.25 mg, will mail to patient along with Xarelto application next week.   Pattricia Boss, [place] Pharmacist Assistant [phone]

## 2022-02-10 NOTE — Chronic Care Management (AMB) (Signed)
A user error has taken place: encounter opened in error, closed for administrative reasons.

## 2022-02-12 LAB — CBC WITH DIFFERENTIAL/PLATELET
Basophils Absolute: 0.1 10*3/uL (ref 0.0–0.2)
Basos: 1 %
EOS (ABSOLUTE): 0.3 10*3/uL (ref 0.0–0.4)
Eos: 4 %
Hematocrit: 43.7 % (ref 34.0–46.6)
Hemoglobin: 14.4 g/dL (ref 11.1–15.9)
Immature Grans (Abs): 0 10*3/uL (ref 0.0–0.1)
Immature Granulocytes: 0 %
Lymphocytes Absolute: 2.4 10*3/uL (ref 0.7–3.1)
Lymphs: 32 %
MCH: 31 pg (ref 26.6–33.0)
MCHC: 33 g/dL (ref 31.5–35.7)
MCV: 94 fL (ref 79–97)
Monocytes Absolute: 0.7 10*3/uL (ref 0.1–0.9)
Monocytes: 9 %
Neutrophils Absolute: 3.9 10*3/uL (ref 1.4–7.0)
Neutrophils: 54 %
Platelets: 344 10*3/uL (ref 150–450)
RBC: 4.65 x10E6/uL (ref 3.77–5.28)
RDW: 12.8 % (ref 11.7–15.4)
WBC: 7.4 10*3/uL (ref 3.4–10.8)

## 2022-02-12 LAB — LIPID PANEL
Chol/HDL Ratio: 2.1 ratio (ref 0.0–4.4)
Cholesterol, Total: 187 mg/dL (ref 100–199)
HDL: 87 mg/dL (ref >39)
LDL Chol Calc (NIH): 79 mg/dL (ref 0–99)
Triglycerides: 126 mg/dL (ref 0–149)
VLDL Cholesterol Cal: 21 mg/dL (ref 5–40)

## 2022-02-12 LAB — VITAMIN D 25 HYDROXY (VIT D DEFICIENCY, FRACTURES): Vit D, 25-Hydroxy: 58.5 ng/mL (ref 30.0–100.0)

## 2022-02-12 LAB — B12 AND FOLATE PANEL
Folate: >20 ng/mL (ref >3.0)
Vitamin B-12: 598 pg/mL (ref 232–1245)

## 2022-02-12 LAB — COMPREHENSIVE METABOLIC PANEL
ALT: 30 IU/L (ref 0–32)
AST: 29 IU/L (ref 0–40)
Albumin/Globulin Ratio: 2 (ref 1.2–2.2)
Albumin: 4.6 g/dL (ref 3.8–4.8)
Alkaline Phosphatase: 100 IU/L (ref 44–121)
BUN/Creatinine Ratio: 12 (ref 12–28)
BUN: 12 mg/dL (ref 8–27)
Bilirubin Total: 0.5 mg/dL (ref 0.0–1.2)
CO2: 27 mmol/L (ref 20–29)
Calcium: 9.7 mg/dL (ref 8.7–10.3)
Chloride: 102 mmol/L (ref 96–106)
Creatinine, Ser: 1 mg/dL (ref 0.57–1.00)
Globulin, Total: 2.3 g/dL (ref 1.5–4.5)
Glucose: 125 mg/dL — ABNORMAL HIGH (ref 70–99)
Potassium: 4.4 mmol/L (ref 3.5–5.2)
Sodium: 145 mmol/L — ABNORMAL HIGH (ref 134–144)
Total Protein: 6.9 g/dL (ref 6.0–8.5)
eGFR: 60 mL/min/{1.73_m2} (ref >59)

## 2022-02-12 LAB — MICROALBUMIN / CREATININE URINE RATIO
Creatinine, Urine: 60.9 mg/dL
Microalb/Creat Ratio: 62 mg/g creat — ABNORMAL HIGH (ref 0–29)
Microalbumin, Urine: 37.5 ug/mL

## 2022-02-12 LAB — T4, FREE: Free T4: 1.26 ng/dL (ref 0.82–1.77)

## 2022-02-12 LAB — HEMOGLOBIN A1C
Est. average glucose Bld gHb Est-mCnc: 137 mg/dL
Hgb A1c MFr Bld: 6.4 % — ABNORMAL HIGH (ref 4.8–5.6)

## 2022-02-12 LAB — TSH: TSH: 5.7 u[IU]/mL — ABNORMAL HIGH (ref 0.450–4.500)

## 2022-02-12 LAB — METHYLMALONIC ACID, SERUM: Methylmalonic Acid: 241 nmol/L (ref 0–378)

## 2022-02-12 NOTE — Progress Notes (Signed)
Blood count normal.  Liver function normal.  Kidney function normal.  Thyroid function abnormal. Tsh 5.7.Free T4 normal. Subclinical hypothyroidism. Synthroid 25 mcg one in am. Send brand name. Repeat tsh, free T4 in 6 weeks. Cholesterol: good HBA1C: 6.4. well controlled.  Spilling protein in urine.

## 2022-02-17 ENCOUNTER — Telehealth: Payer: Self-pay

## 2022-02-17 ENCOUNTER — Other Ambulatory Visit: Payer: Self-pay

## 2022-02-17 MED ORDER — SYNTHROID 25 MCG PO TABS: 25.0000 ug | ORAL_TABLET | Freq: Every day | ORAL | 1 refills | Status: DC

## 2022-02-17 NOTE — Telephone Encounter (Signed)
Patient returned call regarding lab results. She is agreeable to starting synthroid 25 mcg. This was [facility] to [facility] (brand name). She is unsure when she will receive medication. When she receives medication and begins she will return call to schedule appointment for 6 weeks from start date to recheck TSH and Free T4.   Royce Macadamia, [place] 02/17/22 12:07 PM

## 2022-02-20 ENCOUNTER — Ambulatory Visit: Payer: PPO | Admitting: Cardiology

## 2022-02-20 ENCOUNTER — Ambulatory Visit (INDEPENDENT_AMBULATORY_CARE_PROVIDER_SITE_OTHER): Payer: PPO

## 2022-02-20 ENCOUNTER — Encounter: Payer: Self-pay | Admitting: Cardiology

## 2022-02-20 VITALS — BP 140/82 | HR 75 | Ht 67.0 in | Wt 179.4 lb

## 2022-02-20 VITALS — BP 132/78 | HR 84 | Resp 16 | Ht 67.0 in | Wt 180.4 lb

## 2022-02-20 DIAGNOSIS — I4819 Other persistent atrial fibrillation: Secondary | ICD-10-CM

## 2022-02-20 DIAGNOSIS — Z0000 Encounter for general adult medical examination without abnormal findings: Secondary | ICD-10-CM

## 2022-02-20 DIAGNOSIS — D6869 Other thrombophilia: Secondary | ICD-10-CM

## 2022-02-20 DIAGNOSIS — M1712 Unilateral primary osteoarthritis, left knee: Secondary | ICD-10-CM | POA: Diagnosis not present

## 2022-02-20 NOTE — Patient Instructions (Signed)
Medication Instructions:  Your physician recommends that you continue on your current medications as directed. Please refer to the Current Medication list given to you today.  *If you need a refill on your cardiac medications before your next appointment, please call your pharmacy*   Lab Work: None ordered.  If you have labs (blood work) drawn today and your tests are completely normal, you will receive your results only by: [place] (if you have MyChart) OR A paper copy in the mail If you have any lab test that is abnormal or we need to change your treatment, we will call you to review the results.   Testing/Procedures: None ordered.    Follow-Up: At [facility], you and your health needs are our priority.  As part of our continuing mission to provide you with exceptional heart care, we have created designated Provider Care Teams.  These Care Teams include your primary Cardiologist (physician) and Advanced Practice Providers (APPs -  Physician Assistants and Nurse Practitioners) who all work together to provide you with the care you need, when you need it.  We recommend signing up for the patient portal called "MyChart".  Sign up information is provided on this After Visit Summary.  MyChart is used to connect with patients for Virtual Visits (Telemedicine).  Patients are able to view lab/test results, encounter notes, upcoming appointments, etc.  Non-urgent messages can be sent to your provider as well.   To learn more about what you can do with MyChart, go to [email or web address].    Your next appointment:   6 months with Dr Curt Bears  Important Information About Sugar

## 2022-02-20 NOTE — Progress Notes (Signed)
Electrophysiology Office Note   Date:  02/20/2022   ID:  April Munoz, DOB [birth year], MRN [id number]  PCP:  [name], MD  Cardiologist: Bettina Gavia Primary Electrophysiologist:  Constance Haw, MD    No chief complaint on file.     History of Present Illness: April Munoz is a 71 y.o. female who is being seen today for the evaluation of atrial fibrillation at the request of Cox, April Maxwell, MD. Presenting today for electrophysiology evaluation.  She has a history significant for persistent atrial fibrillation, depression, diabetes, hypertension, hyperlipidemia, TIA, atrial flutter.  She is status post atrial fibrillation ablation 03/21/2019.  She then presented with typical atrial flutter and is post ablation 05/29/2020.  She developed left atrial flutters and is now status post ablation 10/27/2021.  Today, denies symptoms of palpitations, chest pain, shortness of breath, orthopnea, PND, lower extremity edema, claudication, dizziness, presyncope, syncope, bleeding, or neurologic sequela. The patient is tolerating medications without difficulties.  Since her ablation she has done well.  She has had no further episodes of tachypalpitations.  She is overall quite comfortable with her control.  She does feel that she did not quite progress quickly from this ablation as her prior ablation, though she is then feeling well and is able to do her daily activities.   Past Medical History:  Diagnosis Date   Anxiety    Arthritis    Ataxic gait    Atrial fibrillation (HCC)    Atrophy of thyroid    B12 deficiency 10/31/2017   Cellulitis of abdominal wall 07/07/2016   Chronic anticoagulation 05/31/2018   Depression    Depression, major, recurrent, mild ([place]) 09/16/2019   Diabetes mellitus without complication ([place])    Diabetic glomerulopathy (April Munoz) 09/16/2019   Esophageal stricture 06/05/2017   Essential hypertension 02/24/2013   Fatigue 01/20/2020   Fibromyalgia 02/24/2013   Full dentures     Hereditary and idiopathic peripheral neuropathy 02/24/2013   High risk medication use 11/07/2017   Hyperlipidemia 02/24/2013   Hypertension    Hypertensive heart disease 02/24/2013   Incontinence    Insomnia    Longstanding persistent atrial fibrillation (April Munoz) 09/16/2019   Mild vitamin D deficiency 09/16/2019   Mixed hyperlipidemia 02/24/2013   Murmur, cardiac 08/15/2015   Neuropathy in diabetes (April Munoz) 10/31/2017   Osteoporosis    Other amnesia    Other amnesia    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    Paroxysmal atrial fibrillation (April Munoz) 08/15/2015   CHADS2vasc=3 CHADS2vasc=3   Primary insomnia    Restless legs syndrome 03/14/2013   Secondary hypothyroidism 09/16/2019   Thoracic or lumbosacral neuritis or radiculitis 02/24/2013   Type 2 diabetes mellitus, without long-term current use of insulin (April Munoz) 02/24/2013   Urge incontinence of urine 09/16/2019   Wears glasses    Past Surgical History:  Procedure Laterality Date   A-FLUTTER ABLATION N/A 07/30/2019   Procedure: A-FLUTTER ABLATION;  Surgeon: Constance Haw, MD;  Location: [facility];  Service: Cardiovascular;  Laterality: N/A;   ATRIAL FIBRILLATION ABLATION N/A 03/21/2019   Procedure: ATRIAL FIBRILLATION ABLATION;  Surgeon: Constance Haw, MD;  Location: [facility];  Service: Cardiovascular;  Laterality: N/A;   ATRIAL FIBRILLATION ABLATION N/A 10/27/2021   Procedure: ATRIAL FIBRILLATION ABLATION;  Surgeon: Constance Haw, MD;  Location: [facility];  Service: Cardiovascular;  Laterality: N/A;   [place] STUDY  11/26/2020   Procedure: BUBBLE STUDY;  Surgeon: Lyman Bishop  C, MD;  Location: [facility];  Service: Cardiovascular;;   CARDIOVERSION N/A 11/26/2020   Procedure: CARDIOVERSION;  Surgeon: Pixie Casino, MD;  Location: [facility];  Service: Cardiovascular;  Laterality: N/A;   CATARACT EXTRACTION, BILATERAL  2018, 2019   CHOLECYSTECTOMY   1971   open   DIAGNOSTIC LAPAROSCOPY  2010   lysis of adhesions   DILATION AND CURETTAGE OF UTERUS  2004   KNEE ARTHROSCOPY  1998   left   KNEE ARTHROSCOPY W/ LATERAL RETINACULAR REPAIR     MASS EXCISION Left 11/11/2013   Procedure: EXCISION MUCOID CYST LEFT INDEX FINGER/DEBRIDEMENT LEFT INDEX FINGER;  Surgeon: Wynonia Sours, MD;  Location: [facility];  Service: Orthopedics;  Laterality: Left;  ANESTHESIA: IV REGIONAL/FAB   SHOULDER ARTHROSCOPY W/ ROTATOR CUFF REPAIR  2007   left   TEE WITHOUT CARDIOVERSION N/A 11/26/2020   Procedure: TRANSESOPHAGEAL ECHOCARDIOGRAM (TEE);  Surgeon: Pixie Casino, MD;  Location: [facility];  Service: Cardiovascular;  Laterality: N/A;   TRIGGER FINGER RELEASE Left 11/11/2013   Procedure: RELEASE A-1 PULLEY LEFT RING FINGER;  Surgeon: Wynonia Sours, MD;  Location: [facility];  Service: Orthopedics;  Laterality: Left;   UMBILICAL HERNIA REPAIR  2008, 2010     Current Outpatient Medications  Medication Sig Dispense Refill   ACIDOPHILUS LACTOBACILLUS PO Take 1 capsule by mouth daily.     acyclovir (ZOVIRAX) 800 MG tablet TAKE 1 TABLET BY MOUTH FOUR TIMES A DAY (Patient taking differently: 800 mg 4 (four) times daily as needed (shingles,).) 120 tablet 0   APPLE CIDER VINEGAR PO Take 480 mg by mouth daily.     B Complex-C-Folic Acid TABS Take 1 tablet by mouth daily.     cyanocobalamin (,VITAMIN B-12,) 1000 MCG/ML injection INJECT 1 ML INTO THE MUSCLE EVERY 30 DAYS 3 mL 3   dapagliflozin propanediol (FARXIGA) 10 MG TABS tablet Take 1 tablet (10 mg total) by mouth daily. 90 tablet 0   Dulaglutide (TRULICITY) 0.62 IR/4.8NI SOPN Inject 0.75 mg into the skin once a week. Friday 6 mL 0   EPINEPHrine 0.3 mg/0.3 mL IJ SOAJ injection Inject 0.3 mg into the muscle as needed for anaphylaxis. 1 each 1   ibuprofen (ADVIL) 200 MG tablet Take 400 mg by mouth every 6 (six) hours as needed for headache.     metFORMIN (GLUCOPHAGE) 1000 MG tablet  Take 1 tablet (1,000 mg total) by mouth 2 (two) times daily with a meal. 60 tablet 0   metoprolol succinate (TOPROL-XL) 25 MG 24 hr tablet Take 1 tablet (25 mg total) by mouth in the morning and at bedtime. (Patient taking differently: Take 25 mg by mouth in the morning and at bedtime. Taking 2 QD per Cardio per patient) 180 tablet 0   Multiple Vitamin (MULTIVITAMIN WITH MINERALS) TABS tablet Take 1 tablet by mouth daily.     OLANZapine (ZYPREXA) 5 MG tablet Take 1 tablet (5 mg total) by mouth at bedtime. 30 tablet 1   omeprazole (PRILOSEC) 20 MG capsule Take 1 capsule (20 mg total) by mouth at bedtime. 90 capsule 1   Probiotic Product (PROBIOTIC & ACIDOPHILUS EX ST PO) Take 1 tablet by mouth daily.      rivaroxaban (XARELTO) 20 MG TABS tablet TAKE 1 TABLET BY MOUTH EVERY DAY WITH SUPPER (Patient taking differently: Take 20 mg by mouth daily with supper.) 30 tablet 3   solifenacin (VESICARE) 5 MG tablet Take 1 tablet (5 mg total) by mouth daily.  90 tablet 0   SYNTHROID 25 MCG tablet Take 1 tablet (25 mcg total) by mouth daily before breakfast. 90 tablet 1   torsemide (DEMADEX) 20 MG tablet Take 1 tablet (20 mg total) by mouth 3 (three) times a week. Please take this on Monday, Wednesday, and Friday. 90 tablet 0   venlafaxine XR (EFFEXOR-XR) 75 MG 24 hr capsule Take 2 capsules daily Strength: 75 mg 180 capsule 0   zinc gluconate 50 MG tablet Take 50 mg by mouth daily.     No current facility-administered medications for this visit.    Allergies:   Patient has no known allergies.   Social History:  The patient  reports that she has never smoked. She has never used smokeless tobacco. She reports current alcohol use of about 3.0 - 4.0 standard drinks of alcohol per week. She reports that she does not use drugs.   Family History:  The patient's family history includes Atrial fibrillation in her mother; Diabetes in her maternal grandmother; Heart attack in her brother and mother; Heart disease in her  brother; Heart failure in her father and paternal grandfather; Stroke in her paternal grandfather.   ROS:  Please see the history of present illness.   Otherwise, review of systems is positive for none.   All other systems are reviewed and negative.   PHYSICAL EXAM: VS:  BP (!) 140/82   Pulse 75   Ht '5\' 7"'$  (1.702 m)   Wt 179 lb 6.4 oz (81.4 kg)   SpO2 96%   BMI 28.10 kg/m  , BMI Body mass index is 28.1 kg/m. GEN: Well nourished, well developed, in no acute distress  HEENT: normal  Neck: no JVD, carotid bruits, or masses Cardiac: RRR; no murmurs, rubs, or gallops,no edema  Respiratory:  clear to auscultation bilaterally, normal work of breathing GI: soft, nontender, nondistended, + BS MS: no deformity or atrophy  Skin: warm and dry Neuro:  Strength and sensation are intact Psych: euthymic mood, full affect  EKG:  EKG is ordered today. Personal review of the ekg ordered shows sinus rhythm, PACs  Recent Labs: 02/08/2022: ALT 30; BUN 12; Creatinine, Ser 1.00; Hemoglobin 14.4; Platelets 344; Potassium 4.4; Sodium 145; TSH 5.700    Lipid Panel     Component Value Date/Time   CHOL 187 02/08/2022 0913   TRIG 126 02/08/2022 0913   HDL 87 02/08/2022 0913   CHOLHDL 2.1 02/08/2022 0913   LDLCALC 79 02/08/2022 0913     Wt Readings from Last 3 Encounters:  02/20/22 179 lb 6.4 oz (81.4 kg)  02/08/22 173 lb 9.6 oz (78.7 kg)  10/27/21 190 lb (86.2 kg)      Other studies Reviewed: Additional studies/ records that were reviewed today include: Monitor 01/29/2019 personally reviewed Review of the above records today demonstrates:  1. atrial fibrillation with good heart rate control 2.  Rare ventricular ectopy  TTE 11/08/2020 Ejection fraction 55 to 60% Normal right ventricular size and function Left atrium normal in size Right atrium normal in size Aortic valve trileaflet structurally normal  Normal-appearing mitral valve  Cardiac monitor 05/09/2021 personally  reviewed Predominant underlying rhythm was sinus rhythm Less than 1% ventricular and supraventricular ectopy Triggered event associated with sinus rhythm  ASSESSMENT AND PLAN:  1.  Persistent atrial fibrillation/atypical atrial flutter: Currently on Toprol-XL 50 mg daily, Xarelto 20 mg daily.  Status post ablation 03/21/2019.  CHA2DS2-VASc of 6.  She had a left atrial flutter and is post repeat ablation 10/27/2021. Remains  in sinus rhythm  2.  Hypertension:well controlled  3.  Typical atrial flutter: Status post ablation 05/29/2020.  No obvious recurrence.  4.  Secondary hypercoagulable state: Currently on Xarelto for atrial fibrillation as above.  Current medicines are reviewed at length with the patient today.   The patient does not have concerns regarding her medicines.  The following changes were made today: Stop flecainide  Labs/ tests ordered today include:  Orders Placed This Encounter  Procedures   EKG 12-Lead      Disposition:   FU with Gery Sabedra 6 months  Signed, Beniah Magnan Meredith Leeds, MD  02/20/2022 10:49 AM     [address] [place] [id number] [phone] (office) [phone] (fax)

## 2022-02-21 ENCOUNTER — Ambulatory Visit
Admission: RE | Admit: 2022-02-21 | Discharge: 2022-02-21 | Disposition: A | Discharge status: Home / Self Care | Payer: PPO | Source: Ambulatory Visit | Attending: Family Medicine | Admitting: Family Medicine

## 2022-02-21 DIAGNOSIS — R413 Other amnesia: Secondary | ICD-10-CM

## 2022-02-21 DIAGNOSIS — I6521 Occlusion and stenosis of right carotid artery: Secondary | ICD-10-CM | POA: Diagnosis not present

## 2022-02-21 DIAGNOSIS — R296 Repeated falls: Secondary | ICD-10-CM | POA: Diagnosis not present

## 2022-02-21 DIAGNOSIS — R2689 Other abnormalities of gait and mobility: Secondary | ICD-10-CM

## 2022-02-21 DIAGNOSIS — I6782 Cerebral ischemia: Secondary | ICD-10-CM | POA: Diagnosis not present

## 2022-02-21 MED ORDER — GADOBENATE DIMEGLUMINE 529 MG/ML IV SOLN
17.0000 mL | Freq: Once | INTRAVENOUS | Status: AC | PRN
  Administered 2022-02-21: 17 mL via INTRAVENOUS

## 2022-02-21 MED ORDER — FREESTYLE LIBRE 2 READER DEVI
1.0000 | 0 refills | Status: AC
Start: 2022-02-21 — End: 2023-02-21

## 2022-02-21 NOTE — Progress Notes (Signed)
Subjective:   April Munoz is a 71 y.o. female who presents for Medicare Annual (Subsequent) preventive examination.  This wellness visit is conducted by a nurse.  The patient's medications were reviewed and reconciled since the patient's last visit.  History details were provided by the patient.  The history appears to be reliable.    Medical History: Patient history and Family history was reviewed  Medications, Allergies, and preventative health maintenance was reviewed and updated.   Cardiac Risk Factors include: advanced age (>53mn, >>6women);diabetes mellitus;dyslipidemia;hypertension     Objective:    Today's Vitals   02/20/22 1603  BP: 132/78  Pulse: 84  Resp: 16  SpO2: 95%  Weight: 180 lb 6.4 oz (81.8 kg)  Height: '5\' 7"'$  (1.702 m)  PainSc: 0-No pain   Body mass index is 28.25 kg/m.     10/27/2021    9:11 AM 12/14/2020    4:04 PM 11/26/2020    9:25 AM 12/11/2019    2:49 PM 07/30/2019   10:11 AM 03/21/2019    9:13 AM 11/06/2013    4:43 PM  Advanced Directives  Does Patient Have a Medical Advance Directive? Yes Yes Yes Yes Yes Yes Patient has advance directive, copy not in chart  Type of Advance Directive HL[place]Living will HLeips[place]Living will HWorthLiving will HRansomvilleLiving will [place]rr[place]Living will [place]redellLiving will Living will  Does patient want to make changes to medical advance directive? No - Patient declined No - Patient declined  No - Patient declined No - Patient declined  No change requested  Copy of [place]Republican Cityin Chart?  No - copy requested No - copy requested No - copy requested No - copy requested      Current Medications (verified) Outpatient Encounter Medications as of 02/20/2022  Medication Sig   ACIDOPHILUS LACTOBACILLUS PO Take 1 capsule by mouth daily.   acyclovir (ZOVIRAX) 800 MG tablet TAKE 1 TABLET BY  MOUTH FOUR TIMES A DAY (Patient taking differently: 800 mg 4 (four) times daily as needed (shingles,).)   APPLE CIDER VINEGAR PO Take 480 mg by mouth daily.   B Complex-C-Folic Acid TABS Take 1 tablet by mouth daily.   cyanocobalamin (,VITAMIN B-12,) 1000 MCG/ML injection INJECT 1 ML INTO THE MUSCLE EVERY 30 DAYS   dapagliflozin propanediol (FARXIGA) 10 MG TABS tablet Take 1 tablet (10 mg total) by mouth daily.   Dulaglutide (TRULICITY) 00.35MWS/5.6CLSOPN Inject 0.75 mg into the skin once a week. Friday   EPINEPHrine 0.3 mg/0.3 mL IJ SOAJ injection Inject 0.3 mg into the muscle as needed for anaphylaxis.   ibuprofen (ADVIL) 200 MG tablet Take 400 mg by mouth every 6 (six) hours as needed for headache.   metFORMIN (GLUCOPHAGE) 1000 MG tablet Take 1 tablet (1,000 mg total) by mouth 2 (two) times daily with a meal.   metoprolol succinate (TOPROL-XL) 25 MG 24 hr tablet Take 1 tablet (25 mg total) by mouth in the morning and at bedtime. (Patient taking differently: Take 25 mg by mouth in the morning and at bedtime. Taking 2 QD per Cardio per patient)   Multiple Vitamin (MULTIVITAMIN WITH MINERALS) TABS tablet Take 1 tablet by mouth daily.   OLANZapine (ZYPREXA) 5 MG tablet Take 1 tablet (5 mg total) by mouth at bedtime.   omeprazole (PRILOSEC) 20 MG capsule Take 1 capsule (20 mg total) by mouth at bedtime.   Probiotic Product (  PROBIOTIC & ACIDOPHILUS EX ST PO) Take 1 tablet by mouth daily.    rivaroxaban (XARELTO) 20 MG TABS tablet TAKE 1 TABLET BY MOUTH EVERY DAY WITH SUPPER (Patient taking differently: Take 20 mg by mouth daily with supper.)   solifenacin (VESICARE) 5 MG tablet Take 1 tablet (5 mg total) by mouth daily.   SYNTHROID 25 MCG tablet Take 1 tablet (25 mcg total) by mouth daily before breakfast.   torsemide (DEMADEX) 20 MG tablet Take 1 tablet (20 mg total) by mouth 3 (three) times a week. Please take this on Monday, Wednesday, and Friday.   venlafaxine XR (EFFEXOR-XR) 75 MG 24 hr capsule  Take 2 capsules daily Strength: 75 mg   zinc gluconate 50 MG tablet Take 50 mg by mouth daily.   No facility-administered encounter medications on file as of 02/20/2022.    Allergies (verified) Patient has no known allergies.   History: Past Medical History:  Diagnosis Date   Anxiety    Arthritis    Ataxic gait    Atrial fibrillation (HCC)    Atrophy of thyroid    B12 deficiency 10/31/2017   Cellulitis of abdominal wall 07/07/2016   Chronic anticoagulation 05/31/2018   Depression    Depression, major, recurrent, mild ([place]) 09/16/2019   Diabetes mellitus without complication ([place])    Diabetic glomerulopathy ([place]) 09/16/2019   Esophageal stricture 06/05/2017   Essential hypertension 02/24/2013   Fatigue 01/20/2020   Fibromyalgia 02/24/2013   Full dentures    Hereditary and idiopathic peripheral neuropathy 02/24/2013   High risk medication use 11/07/2017   Hyperlipidemia 02/24/2013   Hypertension    Hypertensive heart disease 02/24/2013   Incontinence    Insomnia    Longstanding persistent atrial fibrillation ([place]) 09/16/2019   Mild vitamin D deficiency 09/16/2019   Mixed hyperlipidemia 02/24/2013   Murmur, cardiac 08/15/2015   Neuropathy in diabetes ([place]) 10/31/2017   Osteoporosis    Other amnesia    Other amnesia    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    Paroxysmal atrial fibrillation ([place]) 08/15/2015   CHADS2vasc=3 CHADS2vasc=3   Primary insomnia    Restless legs syndrome 03/14/2013   Secondary hypothyroidism 09/16/2019   Thoracic or lumbosacral neuritis or radiculitis 02/24/2013   Type 2 diabetes mellitus, without long-term current use of insulin ([place]) 02/24/2013   Urge incontinence of urine 09/16/2019   Wears glasses    Past Surgical History:  Procedure Laterality Date   A-FLUTTER ABLATION N/A 07/30/2019   Procedure: A-FLUTTER ABLATION;  Surgeon: Constance Haw, MD;  Location: [place] [facility];  Service: Cardiovascular;  Laterality: N/A;   ATRIAL  FIBRILLATION ABLATION N/A 03/21/2019   Procedure: ATRIAL FIBRILLATION ABLATION;  Surgeon: Constance Haw, MD;  Location: [place] [facility];  Service: Cardiovascular;  Laterality: N/A;   ATRIAL FIBRILLATION ABLATION N/A 10/27/2021   Procedure: ATRIAL FIBRILLATION ABLATION;  Surgeon: Constance Haw, MD;  Location: [place] [facility];  Service: Cardiovascular;  Laterality: N/A;   [place] STUDY  11/26/2020   Procedure: BUBBLE STUDY;  Surgeon: Pixie Casino, MD;  Location: [place];  Service: Cardiovascular;;   CARDIOVERSION N/A 11/26/2020   Procedure: CARDIOVERSION;  Surgeon: Pixie Casino, MD;  Location: [facility];  Service: Cardiovascular;  Laterality: N/A;   CATARACT EXTRACTION, BILATERAL  2018, 2019   CHOLECYSTECTOMY  1971   open   DIAGNOSTIC LAPAROSCOPY  2010   lysis of adhesions   DILATION AND CURETTAGE OF  UTERUS  2004   KNEE ARTHROSCOPY  1998   left   KNEE ARTHROSCOPY W/ LATERAL RETINACULAR REPAIR     MASS EXCISION Left 11/11/2013   Procedure: EXCISION MUCOID CYST LEFT INDEX FINGER/DEBRIDEMENT LEFT INDEX FINGER;  Surgeon: Wynonia Sours, MD;  Location: [place];  Service: Orthopedics;  Laterality: Left;  ANESTHESIA: IV REGIONAL/FAB   SHOULDER ARTHROSCOPY W/ ROTATOR CUFF REPAIR  2007   left   TEE WITHOUT CARDIOVERSION N/A 11/26/2020   Procedure: TRANSESOPHAGEAL ECHOCARDIOGRAM (TEE);  Surgeon: Pixie Casino, MD;  Location[facility] ENDOSCOPY;  Service: Cardiovascular;  Laterality: N/A;   TRIGGER FINGER RELEASE Left 11/11/2013   Procedure: RELEASE A-1 PULLEY LEFT RING FINGER;  Surgeon: Wynonia Sours, MD;  Location: [place];  Service: Orthopedics;  Laterality: Left;   UMBILICAL HERNIA REPAIR  2008, 2010   Family History  Problem Relation Age of Onset   Atrial fibrillation Mother    Heart attack Mother    Heart failure Father    Heart disease Brother    Heart attack Brother    Bladder Cancer Brother     Diabetes Maternal Grandmother    Heart failure Paternal Grandfather    Stroke Paternal Grandfather    Social History   Socioeconomic History   Marital status: Married    Spouse name: Liliane Channel   Number of children: 2   Years of education: Not on file   Highest education level: Some college, no degree  Occupational History   Occupation: Retired  Tobacco Use   Smoking status: Never   Smokeless tobacco: Never  Vaping Use   Vaping Use: Never used  Substance and Sexual Activity   Alcohol use: Yes    Alcohol/week: 3.0 - 4.0 standard drinks of alcohol    Types: 1 - 2 Glasses of wine, 2 Shots of liquor per week    Comment: 4 times a week   Drug use: No   Sexual activity: Not on file  Other Topics Concern   Not on file  Social History Narrative   Lives at home with husband - one daughter that lives in [place] (adopted) and one son (adopted)   Left handed   Caffeine: caffeine free drinks. 2 cup of coffee a day   Social Determinants of Health   Financial Resource Strain: High Risk (07/04/2021)   Overall Financial Resource Strain (CARDIA)    Difficulty of Paying Living Expenses: Hard  Food Insecurity: No Food Insecurity (12/02/2020)   Hunger Vital Sign    Worried About Running Out of Food in the Last Year: Never true    Ran Out of Food in the Last Year: Never true  Transportation Needs: No Transportation Needs (07/04/2021)   PRAPARE - Hydrologist (Medical): No    Lack of Transportation (Non-Medical): No  Physical Activity: Not on file  Stress: Not on file  Social Connections: Not on file    Tobacco Counseling Counseling given: No tobacco products used   Clinical Intake:  Pre-visit preparation completed: Yes Pain : No/denies pain Pain Score: 0-No pain   BMI - recorded: 28.25 Nutritional Status: BMI 25 -29 Overweight Nutritional Risks: None Diabetes: Yes (A1C 6.4) CBG done?: No Did pt. bring in CBG monitor from home?: No (patient needs a new  reader) How often do you need to have someone help you when you read instructions, pamphlets, or other written materials from your doctor or pharmacy?: 3 - Sometimes What is the last grade  level you completed in school?: 14 Interpreter Needed?: No    Activities of Daily Living    02/20/2022    4:11 PM  In your present state of health, do you have any difficulty performing the following activities:  Hearing? 0  Vision? 0  Difficulty concentrating or making decisions? 1  Walking or climbing stairs? 1  Dressing or bathing? 0  Doing errands, shopping? 0  Preparing Food and eating ? N  Using the Toilet? N  In the past six months, have you accidently leaked urine? Y  Do you have problems with loss of bowel control? N  Managing your Medications? N  Managing your Finances? N  Housekeeping or managing your Housekeeping? N    Patient Care Team: [name], MD as PCP - General (Family Medicine) Richardo Priest, MD as PCP - Cardiology (Cardiology) Constance Haw, MD as PCP - Electrophysiology (Cardiology) Richardo Priest, MD as Consulting Physician (Cardiology) Misenheimer, Christia Reading, MD as Consulting Physician (Unknown Physician Specialty) Pollyann Samples, MD as Consulting Physician (General Surgery) Lane Hacker, [facility] as Pharmacist (Pharmacist)     Assessment:   This is a routine wellness examination for [place].  Hearing/Vision screen No results found.  Dietary issues and exercise activities discussed: Current Exercise Habits: Home exercise routine, Type of exercise: stretching;walking, Time (Minutes): 20, Frequency (Times/Week): 4, Weekly Exercise (Minutes/Week): 80, Intensity: Mild, Exercise limited by: None identified  Depression Screen    02/20/2022    4:10 PM 02/08/2022    8:44 AM 06/19/2021    8:54 PM 03/08/2021   11:14 AM 02/14/2021   11:06 AM 01/31/2021    9:49 AM 12/14/2020    4:06 PM  PHQ 2/9 Scores  PHQ - 2 Score '6 6 3 2 6 5 4  '$ PHQ- 9 Score '22 22 15 7 20 14 12     '$ Fall Risk    02/20/2022    4:10 PM 12/14/2020    4:05 PM 10/01/2020   11:19 AM 09/03/2020    8:53 AM 05/27/2020    8:13 AM  Fall Risk   Falls in the past year? '1 1 1 1 1  '$ Number falls in past yr: '1 1 1 '$ 0 1  Injury with Fall? 1 0 1 1   Risk for fall due to : History of fall(s);Impaired balance/gait Other (Comment);History of fall(s) Impaired balance/gait    Risk for fall due to: Comment  numbness in feet     Follow up Falls evaluation completed;Education provided;Falls prevention discussed Falls evaluation completed;Education provided;Falls prevention discussed       FALL RISK PREVENTION PERTAINING TO THE HOME:  Any stairs in or around the home? Yes  If so, are there any without handrails? No  Home free of loose throw rugs in walkways, pet beds, electrical cords, etc? Yes  Adequate lighting in your home to reduce risk of falls? Yes   ASSISTIVE DEVICES UTILIZED TO PREVENT FALLS:  Life alert? No  Use of a cane, walker or w/c? No  Grab bars in the bathroom? Yes  Shower chair or bench in shower? Yes   Gait slow and steady without use of assistive device  Cognitive Function:    02/20/2022    4:12 PM 02/08/2022    8:55 AM  MMSE - Mini Mental State Exam  Orientation to time 3 3  Orientation to Place 5 4  Registration 3 3  Attention/ Calculation 3 5  Recall 2 3  Language- name 2 objects 2 2  Language- repeat 1 1  Language- follow 3 step command 3 3  Language- read & follow direction 1 1  Write a sentence 1 1  Copy design 1 1  Total score 25 27        12/14/2020    4:09 PM 12/11/2019    2:54 PM  6CIT Screen  What Year? 0 points 0 points  What month? 0 points 0 points  What time? 0 points 0 points  Count back from 20 0 points 2 points  Months in reverse 2 points 2 points  Repeat phrase 4 points 6 points  Total Score 6 points 10 points    Immunizations Immunization History  Administered Date(s) Administered   Fluad Quad(high Dose 65+) 03/18/2019, 04/25/2020,  04/16/2021   Influenza-Unspecified 03/18/2019, 05/14/2020   Moderna Sars-Covid-2 Vaccination 09/09/2019, 10/04/2019, 04/19/2020, 02/18/2021   Pneumococcal Conjugate-13 05/14/2015   Pneumococcal Polysaccharide-23 05/16/2013, 07/27/2017    TDAP status: Due, Education has been provided regarding the importance of this vaccine. Advised may receive this vaccine at local pharmacy or Health Dept. Aware to provide a copy of the vaccination record if obtained from local pharmacy or Health Dept. Verbalized acceptance and understanding.  Flu Vaccine status: Due, Education has been provided regarding the importance of this vaccine. Advised may receive this vaccine at local pharmacy or Health Dept. Aware to provide a copy of the vaccination record if obtained from local pharmacy or Health Dept. Verbalized acceptance and understanding.  Pneumococcal vaccine status: Up to date  Covid-19 vaccine status: Completed vaccines  Qualifies for Shingles Vaccine? Yes   Zostavax completed No   Shingrix Completed?: No.    Education has been provided regarding the importance of this vaccine. Patient has been advised to call insurance company to determine out of pocket expense if they have not yet received this vaccine. Advised may also receive vaccine at local pharmacy or Health Dept. Verbalized acceptance and understanding.  Screening Tests Health Maintenance  Topic Date Due   Hepatitis C Screening  Never done   TETANUS/TDAP  Never done   Zoster Vaccines- Shingrix (1 of 2) Never done   COLONOSCOPY (Pts 45-77yr Insurance coverage will need to be confirmed)  12/01/2020   COVID-19 Vaccine (5 - Booster for Moderna series) 04/15/2021   FOOT EXAM  05/27/2021   OPHTHALMOLOGY EXAM  10/14/2021   INFLUENZA VACCINE  02/14/2022   MAMMOGRAM  04/01/2022   HEMOGLOBIN A1C  08/11/2022   URINE MICROALBUMIN  02/09/2023   DEXA SCAN  04/02/2023   Pneumonia Vaccine 71 Years old  Completed   HPV VACCINES  Aged Out    Health  Maintenance  Health Maintenance Due  Topic Date Due   Hepatitis C Screening  Never done   TETANUS/TDAP  Never done   Zoster Vaccines- Shingrix (1 of 2) Never done   COLONOSCOPY (Pts 45-419yrInsurance coverage will need to be confirmed)  12/01/2020   COVID-19 Vaccine (5 - Booster for Moderna series) 04/15/2021   FOOT EXAM  05/27/2021   OPHTHALMOLOGY EXAM  10/14/2021   INFLUENZA VACCINE  02/14/2022    Colorectal cancer screening: Type of screening: Colonoscopy. Completed 2012. Repeat every 10 years -- Patient declined at this time  Mammogram status: Completed 04/01/2021. Repeat every year  Bone Density status: Completed 04/01/2021. Results reflect: Bone density results: OSTEOPOROSIS. Repeat every 2 years.  Lung Cancer Screening: (Low Dose CT Chest recommended if Age 73-80ears, 30 pack-year currently smoking OR have quit w/in 15years.) does not qualify.   Additional Screening:  Vision Screening: Recommended annual ophthalmology exams for early detection of glaucoma and other disorders of the eye. Is the patient up to date with their annual eye exam?  Yes  Who is the provider or what is the name of the office in which the patient attends annual eye exams? Dr Ovid Curd  Dental Screening: Recommended annual dental exams for proper oral hygiene     Plan:    1- Flu vaccine and COVID booster recommended this fall 2- Tetanus Booster and Shingrix Vaccine recommended - patient will get these at the pharmacy 3- Mammogram due in September  I have personally reviewed and noted the following in the patient's chart:   Medical and social history Use of alcohol, tobacco or illicit drugs  Current medications and supplements including opioid prescriptions.  Functional ability and status Nutritional status Physical activity Advanced directives List of other physicians Hospitalizations, surgeries, and ER visits in previous 12 months Vitals Screenings to include cognitive, depression, and  falls Referrals and appointments  In addition, I have reviewed and discussed with patient certain preventive protocols, quality metrics, and best practice recommendations. A written personalized care plan for preventive services as well as general preventive health recommendations were provided to patient.     Erie Noe, LPN   01/23/4800

## 2022-02-21 NOTE — Patient Instructions (Signed)
April Munoz , Thank you for taking time to come for your Medicare Wellness Visit. I appreciate your ongoing commitment to your health goals. Please review the following plan we discussed and let me know if I can assist you in the future.   Screening recommendations/referrals: Colonoscopy: Due Mammogram: Due in September - please call to schedule Bone Density: Due September 2024 Recommended yearly ophthalmology/optometry visit for glaucoma screening and checkup Recommended yearly dental visit for hygiene and checkup  Vaccinations: Influenza vaccine: Due this fall Pneumococcal vaccine: Complete Tdap vaccine: Due - can get at most local pharmacies Shingles vaccine: Due - can get at most local pharmacies    Advanced directives: Please bring a copy for our files   Preventive Care 65 Years and Older, Female Preventive care refers to lifestyle choices and visits with your health care provider that can promote health and wellness. What does preventive care include? A yearly physical exam. This is also called an annual well check. Dental exams once or twice a year. Routine eye exams. Ask your health care provider how often you should have your eyes checked. Personal lifestyle choices, including: Daily care of your teeth and gums. Regular physical activity. Eating a healthy diet. Avoiding tobacco and drug use. Limiting alcohol use. Practicing safe sex. Taking low-dose aspirin every day. Taking vitamin and mineral supplements as recommended by your health care provider. What happens during an annual well check? The services and screenings done by your health care provider during your annual well check will depend on your age, overall health, lifestyle risk factors, and family history of disease. Counseling  Your health care provider may ask you questions about your: Alcohol use. Tobacco use. Drug use. Emotional well-being. Home and relationship well-being. Sexual activity. Eating  habits. History of falls. Memory and ability to understand (cognition). Work and work Statistician. Reproductive health. Screening  You may have the following tests or measurements: Height, weight, and BMI. Blood pressure. Lipid and cholesterol levels. These may be checked every 5 years, or more frequently if you are over 61 years old. Skin check. Lung cancer screening. You may have this screening every year starting at age 76 if you have a 30-pack-year history of smoking and currently smoke or have quit within the past 15 years. Fecal occult blood test (FOBT) of the stool. You may have this test every year starting at age 30. Flexible sigmoidoscopy or colonoscopy. You may have a sigmoidoscopy every 5 years or a colonoscopy every 10 years starting at age 61. Hepatitis C blood test. Hepatitis B blood test. Sexually transmitted disease (STD) testing. Diabetes screening. This is done by checking your blood sugar (glucose) after you have not eaten for a while (fasting). You may have this done every 1-3 years. Bone density scan. This is done to screen for osteoporosis. You may have this done starting at age 39. Mammogram. This may be done every 1-2 years. Talk to your health care provider about how often you should have regular mammograms. Talk with your health care provider about your test results, treatment options, and if necessary, the need for more tests. Vaccines  Your health care provider may recommend certain vaccines, such as: Influenza vaccine. This is recommended every year. Tetanus, diphtheria, and acellular pertussis (Tdap, Td) vaccine. You may need a Td booster every 10 years. Zoster vaccine. You may need this after age 91. Pneumococcal 13-valent conjugate (PCV13) vaccine. One dose is recommended after age 27. Pneumococcal polysaccharide (PPSV23) vaccine. One dose is recommended after age 40.  Talk to your health care provider about which screenings and vaccines you need and how  often you need them. This information is not intended to replace advice given to you by your health care provider. Make sure you discuss any questions you have with your health care provider. Document Released: 07/30/2015 Document Revised: 03/22/2016 Document Reviewed: 05/04/2015 Elsevier Interactive Patient Education  2017 [place] Prevention in the Home Falls can cause injuries. They can happen to people of all ages. There are many things you can do to make your home safe and to help prevent falls. What can I do on the outside of my home? Regularly fix the edges of walkways and driveways and fix any cracks. Remove anything that might make you trip as you walk through a door, such as a raised step or threshold. Trim any bushes or trees on the path to your home. Use bright outdoor lighting. Clear any walking paths of anything that might make someone trip, such as rocks or tools. Regularly check to see if handrails are loose or broken. Make sure that both sides of any steps have handrails. Any raised decks and porches should have guardrails on the edges. Have any leaves, snow, or ice cleared regularly. Use sand or salt on walking paths during winter. Clean up any spills in your garage right away. This includes oil or grease spills. What can I do in the bathroom? Use night lights. Install grab bars by the toilet and in the tub and shower. Do not use towel bars as grab bars. Use non-skid mats or decals in the tub or shower. If you need to sit down in the shower, use a plastic, non-slip stool. Keep the floor dry. Clean up any water that spills on the floor as soon as it happens. Remove soap buildup in the tub or shower regularly. Attach bath mats securely with double-sided non-slip rug tape. Do not have throw rugs and other things on the floor that can make you trip. What can I do in the bedroom? Use night lights. Make sure that you have a light by your bed that is easy to  reach. Do not use any sheets or blankets that are too big for your bed. They should not hang down onto the floor. Have a firm chair that has side arms. You can use this for support while you get dressed. Do not have throw rugs and other things on the floor that can make you trip. What can I do in the kitchen? Clean up any spills right away. Avoid walking on wet floors. Keep items that you use a lot in easy-to-reach places. If you need to reach something above you, use a strong step stool that has a grab bar. Keep electrical cords out of the way. Do not use floor polish or wax that makes floors slippery. If you must use wax, use non-skid floor wax. Do not have throw rugs and other things on the floor that can make you trip. What can I do with my stairs? Do not leave any items on the stairs. Make sure that there are handrails on both sides of the stairs and use them. Fix handrails that are broken or loose. Make sure that handrails are as long as the stairways. Check any carpeting to make sure that it is firmly attached to the stairs. Fix any carpet that is loose or worn. Avoid having throw rugs at the top or bottom of the stairs. If you do have throw  rugs, attach them to the floor with carpet tape. Make sure that you have a light switch at the top of the stairs and the bottom of the stairs. If you do not have them, ask someone to add them for you. What else can I do to help prevent falls? Wear shoes that: Do not have high heels. Have rubber bottoms. Are comfortable and fit you well. Are closed at the toe. Do not wear sandals. If you use a stepladder: Make sure that it is fully opened. Do not climb a closed stepladder. Make sure that both sides of the stepladder are locked into place. Ask someone to hold it for you, if possible. Clearly mark and make sure that you can see: Any grab bars or handrails. First and last steps. Where the edge of each step is. Use tools that help you move  around (mobility aids) if they are needed. These include: Canes. Walkers. Scooters. Crutches. Turn on the lights when you go into a dark area. Replace any light bulbs as soon as they burn out. Set up your furniture so you have a clear path. Avoid moving your furniture around. If any of your floors are uneven, fix them. If there are any pets around you, be aware of where they are. Review your medicines with your doctor. Some medicines can make you feel dizzy. This can increase your chance of falling. Ask your doctor what other things that you can do to help prevent falls. This information is not intended to replace advice given to you by your health care provider. Make sure you discuss any questions you have with your health care provider. Document Released: 04/29/2009 Document Revised: 12/09/2015 Document Reviewed: 08/07/2014 Elsevier Interactive Patient Education  2017 [employer].

## 2022-02-23 ENCOUNTER — Other Ambulatory Visit: Payer: Self-pay

## 2022-02-23 MED ORDER — DONEPEZIL HCL 5 MG PO TABS: 5.0000 mg | ORAL_TABLET | Freq: Every day | ORAL | 0 refills | Status: DC

## 2022-02-23 MED ORDER — LOSARTAN POTASSIUM 25 MG PO TABS: 25.0000 mg | ORAL_TABLET | Freq: Every day | ORAL | 1 refills | Status: DC

## 2022-02-27 ENCOUNTER — Other Ambulatory Visit: Payer: Self-pay | Admitting: Family Medicine

## 2022-02-27 ENCOUNTER — Encounter: Payer: Self-pay | Admitting: Family Medicine

## 2022-02-27 DIAGNOSIS — F01C3 Vascular dementia, severe, with mood disturbance: Secondary | ICD-10-CM

## 2022-02-27 DIAGNOSIS — R930 Abnormal findings on diagnostic imaging of skull and head, not elsewhere classified: Secondary | ICD-10-CM

## 2022-02-27 DIAGNOSIS — R260 Ataxic gait: Secondary | ICD-10-CM

## 2022-02-27 DIAGNOSIS — G458 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: Secondary | ICD-10-CM

## 2022-02-27 DIAGNOSIS — I6521 Occlusion and stenosis of right carotid artery: Secondary | ICD-10-CM

## 2022-02-27 MED ORDER — ROSUVASTATIN CALCIUM 10 MG PO TABS: 10.0000 mg | ORAL_TABLET | Freq: Every day | ORAL | 0 refills | Status: DC

## 2022-03-02 DIAGNOSIS — G4733 Obstructive sleep apnea (adult) (pediatric): Secondary | ICD-10-CM | POA: Diagnosis not present

## 2022-03-02 DIAGNOSIS — R0602 Shortness of breath: Secondary | ICD-10-CM | POA: Diagnosis not present

## 2022-03-03 ENCOUNTER — Ambulatory Visit (HOSPITAL_COMMUNITY)
Admission: RE | Admit: 2022-03-03 | Discharge: 2022-03-03 | Disposition: A | Discharge status: Home / Self Care | Payer: PPO | Source: Ambulatory Visit | Attending: Family Medicine | Admitting: Family Medicine

## 2022-03-03 ENCOUNTER — Encounter (HOSPITAL_COMMUNITY): Payer: Self-pay

## 2022-03-03 DIAGNOSIS — R930 Abnormal findings on diagnostic imaging of skull and head, not elsewhere classified: Secondary | ICD-10-CM | POA: Insufficient documentation

## 2022-03-03 DIAGNOSIS — G4733 Obstructive sleep apnea (adult) (pediatric): Secondary | ICD-10-CM | POA: Diagnosis not present

## 2022-03-03 DIAGNOSIS — I6623 Occlusion and stenosis of bilateral posterior cerebral arteries: Secondary | ICD-10-CM | POA: Diagnosis not present

## 2022-03-03 DIAGNOSIS — R0602 Shortness of breath: Secondary | ICD-10-CM | POA: Diagnosis not present

## 2022-03-03 DIAGNOSIS — I6521 Occlusion and stenosis of right carotid artery: Secondary | ICD-10-CM | POA: Diagnosis not present

## 2022-03-03 DIAGNOSIS — I6523 Occlusion and stenosis of bilateral carotid arteries: Secondary | ICD-10-CM | POA: Diagnosis not present

## 2022-03-03 DIAGNOSIS — I672 Cerebral atherosclerosis: Secondary | ICD-10-CM | POA: Diagnosis not present

## 2022-03-03 DIAGNOSIS — M852 Hyperostosis of skull: Secondary | ICD-10-CM | POA: Diagnosis not present

## 2022-03-03 MED ORDER — SODIUM CHLORIDE (PF) 0.9 % IJ SOLN
INTRAMUSCULAR | Status: AC
  Filled 2022-03-03: qty 50

## 2022-03-03 MED ORDER — IOHEXOL 350 MG/ML SOLN
75.0000 mL | Freq: Once | INTRAVENOUS | Status: AC | PRN
  Administered 2022-03-03: 75 mL via INTRAVENOUS

## 2022-03-05 ENCOUNTER — Encounter: Payer: Self-pay | Admitting: Family Medicine

## 2022-03-05 DIAGNOSIS — R400 Somnolence: Secondary | ICD-10-CM | POA: Insufficient documentation

## 2022-03-05 NOTE — Assessment & Plan Note (Signed)
Ordering an MRI of her brain.

## 2022-03-05 NOTE — Assessment & Plan Note (Signed)
Well controlled.  No changes to medicines. Patient needs to be taking rosuvastatin 10 mg before bed.  Continue to work on eating a healthy diet and exercise.  Labs drawn today.

## 2022-03-05 NOTE — Assessment & Plan Note (Signed)
On xarelto for atrial fibrillation.

## 2022-03-05 NOTE — Assessment & Plan Note (Signed)
Not quite at goal. Continue Losartan 50 mg daily. Toprol xl 25 mg twice daily.  Continue to work on eating a healthy diet and exercise.  Labs drawn today.

## 2022-03-05 NOTE — Assessment & Plan Note (Signed)
Ordering a sleep study at home.

## 2022-03-05 NOTE — Assessment & Plan Note (Signed)
Change Effexor XR to 75 mg 2 in AM.  Increase olanzapine to 5 mg nightly.  Recommend speak with counselor at hospice.  I believe they have a caregiver program.

## 2022-03-05 NOTE — Assessment & Plan Note (Signed)
Control: at goal Recommend check sugars fasting daily. Recommend check feet daily. Recommend annual eye exams. Medicines: Discontinue Trulicity after runs out.  Continue other medication Continue to work on eating a healthy diet and exercise.  Labs drawn today.

## 2022-03-08 ENCOUNTER — Ambulatory Visit (INDEPENDENT_AMBULATORY_CARE_PROVIDER_SITE_OTHER): Payer: PPO | Admitting: Family Medicine

## 2022-03-08 VITALS — BP 130/78 | HR 76 | Temp 97.1°F | Resp 18 | Ht 67.0 in | Wt 177.0 lb

## 2022-03-08 DIAGNOSIS — G308 Other Alzheimer's disease: Secondary | ICD-10-CM | POA: Diagnosis not present

## 2022-03-08 DIAGNOSIS — F02B3 Dementia in other diseases classified elsewhere, moderate, with mood disturbance: Secondary | ICD-10-CM | POA: Diagnosis not present

## 2022-03-08 DIAGNOSIS — R400 Somnolence: Secondary | ICD-10-CM | POA: Diagnosis not present

## 2022-03-08 DIAGNOSIS — F331 Major depressive disorder, recurrent, moderate: Secondary | ICD-10-CM | POA: Diagnosis not present

## 2022-03-08 DIAGNOSIS — I6621 Occlusion and stenosis of right posterior cerebral artery: Secondary | ICD-10-CM

## 2022-03-08 NOTE — Progress Notes (Addendum)
Subjective:  Patient ID: April Munoz, female    DOB: 1950-07-31  Age: 71 y.o. MRN: [id number]  Chief Complaint  Patient presents with   Abdominal Pain   memory problems    HPI Neurological symptoms: memory problems, balance problems. No visual disturbance. Patient has an appointment with Dr. Leta Baptist next week. Patient had an abnormal mri and abnormal subsequent CTA brain.   MRI BRAIN: 02/21/2022 IMPRESSION: 1. No acute intracranial process. 2. Advanced cerebral volume loss for age with sequela of severe chronic small vessel ischemic disease. No disproportionate lobar atrophy. 3. Narrowing of the right cavernous ICA flow void concerning for moderate to severe stenosis. Consider a CTA head and neck for further evaluation.  CTA Brain and Neck: 03/03/2022  IMPRESSION: 1. Mild for age carotid atherosclerosis. No stenosis at the right cavernous ICA which was highlighted on prior brain MRI. 2. There is high-grade narrowing at the proximal right PCA. 3. Advanced chronic small vessel ischemia. 4. 7 mm colloid cyst.   Patient is currently on crestor and xarelto.  Started on aricept 5 mg before bed. Have not noticed an improvement in memory yet.    Patient has a sleep study last week, but I do not have the results.  Depression: currenlty on effexor xr 75 mg 2 in am. Increased olanzapine to 5 mg before bed. Patient called hospice and is speaking with a counselor.      03/08/2022   11:12 AM 02/20/2022    4:10 PM 02/08/2022    8:44 AM  PHQ9 SCORE ONLY  PHQ-9 Total Score '13 22 22     '$ Current Outpatient Medications on File Prior to Visit  Medication Sig Dispense Refill   ACIDOPHILUS LACTOBACILLUS PO Take 1 capsule by mouth daily.     APPLE CIDER VINEGAR PO Take 480 mg by mouth daily.     B Complex-C-Folic Acid TABS Take 1 tablet by mouth daily.     Continuous Blood Gluc Receiver (FREESTYLE LIBRE 2 READER) DEVI 1 each by Does not apply route as directed. E11.21 Check blood sugar four  times daily as directed 1 each 0   cyanocobalamin (,VITAMIN B-12,) 1000 MCG/ML injection INJECT 1 ML INTO THE MUSCLE EVERY 30 DAYS 3 mL 3   dapagliflozin propanediol (FARXIGA) 10 MG TABS tablet Take 1 tablet (10 mg total) by mouth daily. 90 tablet 0   donepezil (ARICEPT) 5 MG tablet Take 1 tablet (5 mg total) by mouth at bedtime. 90 tablet 0   Dulaglutide (TRULICITY) 1.28 NO/6.7EH SOPN Inject 0.75 mg into the skin once a week. Friday 6 mL 0   EPINEPHrine 0.3 mg/0.3 mL IJ SOAJ injection Inject 0.3 mg into the muscle as needed for anaphylaxis. 1 each 1   losartan (COZAAR) 25 MG tablet Take 1 tablet (25 mg total) by mouth daily. 90 tablet 1   metFORMIN (GLUCOPHAGE) 1000 MG tablet Take 1 tablet (1,000 mg total) by mouth 2 (two) times daily with a meal. 60 tablet 0   Multiple Vitamin (MULTIVITAMIN WITH MINERALS) TABS tablet Take 1 tablet by mouth daily.     OLANZapine (ZYPREXA) 5 MG tablet Take 1 tablet (5 mg total) by mouth at bedtime. 30 tablet 1   omeprazole (PRILOSEC) 20 MG capsule Take 1 capsule (20 mg total) by mouth at bedtime. 90 capsule 1   Probiotic Product (PROBIOTIC & ACIDOPHILUS EX ST PO) Take 1 tablet by mouth daily.      rosuvastatin (CRESTOR) 10 MG tablet Take 1 tablet (10 mg total) by  mouth daily. 90 tablet 0   solifenacin (VESICARE) 5 MG tablet Take 1 tablet (5 mg total) by mouth daily. 90 tablet 0   SYNTHROID 25 MCG tablet Take 1 tablet (25 mcg total) by mouth daily before breakfast. 90 tablet 1   torsemide (DEMADEX) 20 MG tablet Take 1 tablet (20 mg total) by mouth 3 (three) times a week. Please take this on Monday, Wednesday, and Friday. 90 tablet 0   venlafaxine XR (EFFEXOR-XR) 75 MG 24 hr capsule Take 2 capsules daily Strength: 75 mg 180 capsule 0   zinc gluconate 50 MG tablet Take 50 mg by mouth daily.     No current facility-administered medications on file prior to visit.   Past Medical History:  Diagnosis Date   Anxiety    Arthritis    Ataxic gait    Atrial  fibrillation (HCC)    Atrophy of thyroid    B12 deficiency 10/31/2017   Cellulitis of abdominal wall 07/07/2016   Chronic anticoagulation 05/31/2018   Depression    Depression, major, recurrent, mild ([place]) 09/16/2019   Diabetes mellitus without complication ([place])    Diabetic glomerulopathy ([place]) 09/16/2019   Esophageal stricture 06/05/2017   Essential hypertension 02/24/2013   Fatigue 01/20/2020   Fibromyalgia 02/24/2013   Full dentures    Hereditary and idiopathic peripheral neuropathy 02/24/2013   High risk medication use 11/07/2017   Hyperlipidemia 02/24/2013   Hypertension    Hypertensive heart disease 02/24/2013   Incontinence    Insomnia    Longstanding persistent atrial fibrillation ([place]) 09/16/2019   Mild vitamin D deficiency 09/16/2019   Mixed hyperlipidemia 02/24/2013   Murmur, cardiac 08/15/2015   Neuropathy in diabetes ([place]) 10/31/2017   Osteoporosis    Other amnesia    Other amnesia    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    Paroxysmal atrial fibrillation ([place]) 08/15/2015   CHADS2vasc=3 CHADS2vasc=3   Primary insomnia    Restless legs syndrome 03/14/2013   Secondary hypothyroidism 09/16/2019   Thoracic or lumbosacral neuritis or radiculitis 02/24/2013   Type 2 diabetes mellitus, without long-term current use of insulin ([place]) 02/24/2013   Urge incontinence of urine 09/16/2019   Wears glasses    Past Surgical History:  Procedure Laterality Date   A-FLUTTER ABLATION N/A 07/30/2019   Procedure: A-FLUTTER ABLATION;  Surgeon: Constance Haw, MD;  Location: [facility];  Service: Cardiovascular;  Laterality: N/A;   ATRIAL FIBRILLATION ABLATION N/A 03/21/2019   Procedure: ATRIAL FIBRILLATION ABLATION;  Surgeon: Constance Haw, MD;  Location: [facility];  Service: Cardiovascular;  Laterality: N/A;   ATRIAL FIBRILLATION ABLATION N/A 10/27/2021   Procedure: ATRIAL FIBRILLATION ABLATION;  Surgeon: Constance Haw, MD;  Location: [facility];  Service: Cardiovascular;  Laterality: N/A;   [place] STUDY  11/26/2020   Procedure: BUBBLE STUDY;  Surgeon: Pixie Casino, MD;  Location: [facility];  Service: Cardiovascular;;   CARDIOVERSION N/A 11/26/2020   Procedure: CARDIOVERSION;  Surgeon: Pixie Casino, MD;  Location: [facility] ENDOSCOPY;  Service: Cardiovascular;  Laterality: N/A;   CATARACT EXTRACTION, BILATERAL  2018, 2019   CHOLECYSTECTOMY  1971   open   DIAGNOSTIC LAPAROSCOPY  2010   lysis of adhesions   DILATION AND CURETTAGE OF UTERUS  2004   KNEE ARTHROSCOPY  1998   left   KNEE ARTHROSCOPY W/ LATERAL RETINACULAR REPAIR     MASS EXCISION Left 11/11/2013   Procedure: EXCISION MUCOID  CYST LEFT INDEX FINGER/DEBRIDEMENT LEFT INDEX FINGER;  Surgeon: Wynonia Sours, MD;  Location: [place];  Service: Orthopedics;  Laterality: Left;  ANESTHESIA: IV REGIONAL/FAB   SHOULDER ARTHROSCOPY W/ ROTATOR CUFF REPAIR  2007   left   TEE WITHOUT CARDIOVERSION N/A 11/26/2020   Procedure: TRANSESOPHAGEAL ECHOCARDIOGRAM (TEE);  Surgeon: Pixie Casino, MD;  Location: [facility] ENDOSCOPY;  Service: Cardiovascular;  Laterality: N/A;   TRIGGER FINGER RELEASE Left 11/11/2013   Procedure: RELEASE A-1 PULLEY LEFT RING FINGER;  Surgeon: Wynonia Sours, MD;  Location: [facility];  Service: Orthopedics;  Laterality: Left;   UMBILICAL HERNIA REPAIR  2008, 2010    Family History  Problem Relation Age of Onset   Atrial fibrillation Mother    Heart attack Mother    Heart failure Father    Heart disease Brother    Heart attack Brother    Bladder Cancer Brother    Diabetes Maternal Grandmother    Heart failure Paternal Grandfather    Stroke Paternal Grandfather    Social History   Socioeconomic History   Marital status: Married    Spouse name: Liliane Channel   Number of children: 2   Years of education: Not on file   Highest education level: Some college, no degree  Occupational History    Occupation: retired  Tobacco Use   Smoking status: Never   Smokeless tobacco: Never  Vaping Use   Vaping Use: Never used  Substance and Sexual Activity   Alcohol use: Yes    Alcohol/week: 3.0 - 4.0 standard drinks of alcohol    Types: 1 - 2 Glasses of wine, 2 Shots of liquor per week    Comment: 4 times a week   Drug use: No   Sexual activity: Not on file  Other Topics Concern   Not on file  Social History Narrative   Lives at home with husband - one daughter that lives in [place] (adopted) and one son (adopted)   Left handed   Caffeine: caffeine free drinks. 2 cup of coffee a day   Social Determinants of Health   Financial Resource Strain: High Risk (07/04/2021)   Overall Financial Resource Strain (CARDIA)    Difficulty of Paying Living Expenses: Hard  Food Insecurity: No Food Insecurity (12/02/2020)   Hunger Vital Sign    Worried About Running Out of Food in the Last Year: Never true    Ran Out of Food in the Last Year: Never true  Transportation Needs: No Transportation Needs (07/04/2021)   PRAPARE - Hydrologist (Medical): No    Lack of Transportation (Non-Medical): No  Physical Activity: Not on file  Stress: Not on file  Social Connections: Not on file    Review of Systems  Constitutional:  Negative for chills, fatigue and fever.  HENT:  Negative for congestion, rhinorrhea and sore throat.   Respiratory:  Negative for cough and shortness of breath.   Cardiovascular:  Negative for chest pain.  Gastrointestinal:  Negative for abdominal pain, constipation, diarrhea, nausea and vomiting.  Genitourinary:  Negative for dysuria and urgency.  Musculoskeletal:  Negative for back pain and myalgias.  Neurological:  Positive for dizziness and headaches. Negative for weakness and light-headedness.       Balance issues   Psychiatric/Behavioral:  Positive for dysphoric mood. The patient is not nervous/anxious.        Somnolence.     Objective:  BP  130/78   Pulse 76  Temp (!) 97.1 F (36.2 C)   Resp 18   Ht '5\' 7"'$  (1.702 m)   Wt 177 lb (80.3 kg)   BMI 27.72 kg/m      03/08/2022   11:31 AM 02/20/2022    4:03 PM 02/20/2022   10:30 AM  BP/Weight  Systolic BP 562 130 865  Diastolic BP 78 78 82  Wt. (Lbs) 177 180.4 179.4  BMI 27.72 kg/m2 28.25 kg/m2 28.1 kg/m2    Physical Exam Vitals reviewed.  Constitutional:      Appearance: Normal appearance. She is normal weight.  Neck:     Vascular: No carotid bruit.  Cardiovascular:     Rate and Rhythm: Normal rate and regular rhythm.     Heart sounds: Normal heart sounds.  Pulmonary:     Effort: Pulmonary effort is normal. No respiratory distress.     Breath sounds: Normal breath sounds.  Abdominal:     General: Abdomen is flat. Bowel sounds are normal.     Palpations: Abdomen is soft.     Tenderness: There is no abdominal tenderness.  Neurological:     Mental Status: She is alert and oriented to person, place, and time.  Psychiatric:        Mood and Affect: Mood normal.        Behavior: Behavior normal.     Diabetic Foot Exam - Simple   No data filed      Lab Results  Component Value Date   WBC 7.4 02/08/2022   HGB 14.4 02/08/2022   HCT 43.7 02/08/2022   PLT 344 02/08/2022   GLUCOSE 125 (H) 02/08/2022   CHOL 187 02/08/2022   TRIG 126 02/08/2022   HDL 87 02/08/2022   LDLCALC 79 02/08/2022   ALT 30 02/08/2022   AST 29 02/08/2022   NA 145 (H) 02/08/2022   K 4.4 02/08/2022   CL 102 02/08/2022   CREATININE 1.00 02/08/2022   BUN 12 02/08/2022   CO2 27 02/08/2022   TSH 5.700 (H) 02/08/2022   HGBA1C 6.4 (H) 02/08/2022   MICROALBUR 80 03/15/2021      Assessment & Plan:   Problem List Items Addressed This Visit       Cardiovascular and Mediastinum   Occlusion and stenosis of right posterior cerebral artery    Continue crestor 10 mg before bed and xarelto 20 mg daily.  Keep appt with Dr. Leta Baptist.      Relevant Medications   metoprolol succinate  (TOPROL-XL) 25 MG 24 hr tablet   rivaroxaban (XARELTO) 20 MG TABS tablet     Nervous and Auditory   DAT (dementia of Alzheimer type) ([place]) - Primary    Continue aricept 5 mg before bed as has only been on it for 1 1/2 weeks.  Keep appt with Dr. Leta Baptist. Follow up with me in 2 months for chronic visit.          Other   Major depressive disorder, recurrent episode, moderate (HCC)    Improved. Continue effexor xr 75 mg 2 in am and continue olanzapine to 5 mg before bed.       Somnolence    Await sleep study     .  Meds ordered this encounter  Medications   metoprolol succinate (TOPROL-XL) 25 MG 24 hr tablet    Sig: Take 1 tablet (25 mg total) by mouth in the morning and at bedtime. Taking 2 QD per Cardio per patient    Dispense:  1 tablet  Refill:  0   rivaroxaban (XARELTO) 20 MG TABS tablet    Sig: Take 1 tablet (20 mg total) by mouth daily with supper.    Dispense:  1 tablet    Refill:  0   Total time spent on today's visit was greater than 30 minutes, including both face-to-face time and nonface-to-face time personally spent on review of chart (labs and imaging), discussing labs and goals, discussing further work-up, treatment options, referrals to specialist if needed, reviewing outside records of pertinent, answering patient's questions, and coordinating care. I discussed with Dr. Leta Baptist and Dr. Leonie Man via securt chart.    Follow-up: Return in about 2 months (around 05/18/2022) for chronic fasting.  An After Visit Summary was printed and given to the patient.  [name], MD [facility] [phone]

## 2022-03-09 DIAGNOSIS — I6621 Occlusion and stenosis of right posterior cerebral artery: Secondary | ICD-10-CM | POA: Insufficient documentation

## 2022-03-09 DIAGNOSIS — F0150 Vascular dementia without behavioral disturbance: Secondary | ICD-10-CM | POA: Insufficient documentation

## 2022-03-09 DIAGNOSIS — F0280 Dementia in other diseases classified elsewhere without behavioral disturbance: Secondary | ICD-10-CM | POA: Insufficient documentation

## 2022-03-10 ENCOUNTER — Other Ambulatory Visit: Payer: Self-pay

## 2022-03-10 DIAGNOSIS — I4891 Unspecified atrial fibrillation: Secondary | ICD-10-CM

## 2022-03-10 DIAGNOSIS — R400 Somnolence: Secondary | ICD-10-CM

## 2022-03-10 DIAGNOSIS — F5101 Primary insomnia: Secondary | ICD-10-CM

## 2022-03-12 ENCOUNTER — Encounter: Payer: Self-pay | Admitting: Family Medicine

## 2022-03-12 MED ORDER — RIVAROXABAN 20 MG PO TABS: 20.0000 mg | ORAL_TABLET | Freq: Every day | ORAL | 0 refills | Status: DC

## 2022-03-12 MED ORDER — METOPROLOL SUCCINATE ER 25 MG PO TB24: 25.0000 mg | ORAL_TABLET | Freq: Two times a day (BID) | ORAL | 0 refills | Status: DC

## 2022-03-12 NOTE — Assessment & Plan Note (Signed)
Await sleep study

## 2022-03-12 NOTE — Assessment & Plan Note (Signed)
Continue crestor 10 mg before bed and xarelto 20 mg daily.  Keep appt with Dr. Leta Baptist.

## 2022-03-12 NOTE — Assessment & Plan Note (Signed)
Improved. Continue effexor xr 75 mg 2 in am and continue olanzapine to 5 mg before bed.

## 2022-03-12 NOTE — Assessment & Plan Note (Signed)
Continue aricept 5 mg before bed as has only been on it for 1 1/2 weeks.  Keep appt with Dr. Leta Baptist. Follow up with me in 2 months for chronic visit.

## 2022-03-13 DIAGNOSIS — L814 Other melanin hyperpigmentation: Secondary | ICD-10-CM | POA: Diagnosis not present

## 2022-03-13 DIAGNOSIS — L300 Nummular dermatitis: Secondary | ICD-10-CM | POA: Diagnosis not present

## 2022-03-13 DIAGNOSIS — L821 Other seborrheic keratosis: Secondary | ICD-10-CM | POA: Diagnosis not present

## 2022-03-14 ENCOUNTER — Encounter: Payer: Self-pay | Admitting: Diagnostic Neuroimaging

## 2022-03-14 ENCOUNTER — Ambulatory Visit: Payer: PPO | Admitting: Diagnostic Neuroimaging

## 2022-03-14 VITALS — BP 122/77 | HR 101 | Ht 68.0 in | Wt 178.1 lb

## 2022-03-14 DIAGNOSIS — R413 Other amnesia: Secondary | ICD-10-CM

## 2022-03-14 NOTE — Patient Instructions (Addendum)
  MEMORY LOSS (MMSE 25/30; no major changes in ADLs; do not suspect dementia at this time) - likely related to sleep deprivation and anxiety; chronic small vessel ischemic disease may be contributory factors as well - on donepezil per PCP; however, no strong indication for this at this time - encouraged to start exercise program and try to improve sleep hygiene  GENERAL BALANCE DIFFICULTY - likely related to diabetic neuropathy + low back pain (? lumbar radiculopathy) - consider PT evaluation - use cane / walker as needed  ANXIETY / INSOMNIA - consider psychiatry / psychology evaluation - optimize nutrition / exercise

## 2022-03-14 NOTE — Progress Notes (Signed)
[facility]  PATIENT: April Munoz DOB: 25-Dec-1950  REFERRING CLINICIAN: CoxElnita Maxwell, MD  HISTORY FROM: patient  REASON FOR VISIT: follow up   HISTORICAL  CHIEF COMPLAINT:  Chief Complaint  Patient presents with   Follow-up    Pt is well. She wants to discuss her MRI and what it shows. Room 6 with husband    HISTORY OF PRESENT ILLNESS:   UPDATE (03/14/22, VRP): Since last visit, doing about the same. Continues with insomnia, OSA, memory loss. No major changes in ADLs, except some issues with focusing and driving.   PRIOR HPI (3575): 71 year old female here for evaluation of memory loss, balance difficulty, insomnia, anxiety. Patient has hypertension, diabetes, hypercholesterolemia, atrial fibrillation, depression, anxiety, fibromyalgia.  Patient was diagnosed with atrial fibrillation 3 years ago.  She was recommended to start anticoagulation but she has not been able to due to financial limitations.  She does not want to take Coumadin.  She was prescribed Eliquis but cannot afford it.  Over the past 1 year she has had at least 10-12 episodes of transient slurred speech and trouble talking.  Sometimes these are associated with staggering gait.  Episodes last for few minutes at a time.  She has not gone to the emergency room or had evaluation of these in the past.  Patient also has a gradual onset and progressive generalized balance difficulty, with numbness in her feet.  She feels like her toes are weak.  She has been diagnosed with diabetic peripheral neuropathy.  She has numbness and tingling in her feet.  She has been on gabapentin in the past.  Also with chronic anxiety and insomnia since childhood.  She has seen a therapist many years ago but not currently.  She is tried many sleep aids without benefit.   REVIEW OF SYSTEMS: Full 14 system review of systems performed and negative with exception of: as per HPI.   ALLERGIES: Allergies  Allergen Reactions    Atorvastatin     myalgias    HOME MEDICATIONS: Outpatient Medications Prior to Visit  Medication Sig Dispense Refill   ACIDOPHILUS LACTOBACILLUS PO Take 1 capsule by mouth daily.     APPLE CIDER VINEGAR PO Take 480 mg by mouth daily.     B Complex-C-Folic Acid TABS Take 1 tablet by mouth daily.     Continuous Blood Gluc Receiver (FREESTYLE LIBRE 2 READER) DEVI 1 each by Does not apply route as directed. E11.21 Check blood sugar four times daily as directed 1 each 0   cyanocobalamin (,VITAMIN B-12,) 1000 MCG/ML injection INJECT 1 ML INTO THE MUSCLE EVERY 30 DAYS 3 mL 3   dapagliflozin propanediol (FARXIGA) 10 MG TABS tablet Take 1 tablet (10 mg total) by mouth daily. 90 tablet 0   donepezil (ARICEPT) 5 MG tablet Take 1 tablet (5 mg total) by mouth at bedtime. 90 tablet 0   Dulaglutide (TRULICITY) 6.20 BT/5.9RC SOPN Inject 0.75 mg into the skin once a week. Friday 6 mL 0   EPINEPHrine 0.3 mg/0.3 mL IJ SOAJ injection Inject 0.3 mg into the muscle as needed for anaphylaxis. 1 each 1   losartan (COZAAR) 25 MG tablet Take 1 tablet (25 mg total) by mouth daily. 90 tablet 1   metFORMIN (GLUCOPHAGE) 1000 MG tablet Take 1 tablet (1,000 mg total) by mouth 2 (two) times daily with a meal. 60 tablet 0   metoprolol succinate (TOPROL-XL) 25 MG 24 hr tablet Take 1 tablet (25 mg total) by mouth in the morning and  at bedtime. Taking 2 QD per Cardio per patient 1 tablet 0   Multiple Vitamin (MULTIVITAMIN WITH MINERALS) TABS tablet Take 1 tablet by mouth daily.     OLANZapine (ZYPREXA) 5 MG tablet Take 1 tablet (5 mg total) by mouth at bedtime. 30 tablet 1   omeprazole (PRILOSEC) 20 MG capsule Take 1 capsule (20 mg total) by mouth at bedtime. 90 capsule 1   Probiotic Product (PROBIOTIC & ACIDOPHILUS EX ST PO) Take 1 tablet by mouth daily.      rivaroxaban (XARELTO) 20 MG TABS tablet Take 1 tablet (20 mg total) by mouth daily with supper. 1 tablet 0   rosuvastatin (CRESTOR) 10 MG tablet Take 1 tablet (10 mg  total) by mouth daily. 90 tablet 0   solifenacin (VESICARE) 5 MG tablet Take 1 tablet (5 mg total) by mouth daily. 90 tablet 0   SYNTHROID 25 MCG tablet Take 1 tablet (25 mcg total) by mouth daily before breakfast. 90 tablet 1   torsemide (DEMADEX) 20 MG tablet Take 1 tablet (20 mg total) by mouth 3 (three) times a week. Please take this on Monday, Wednesday, and Friday. 90 tablet 0   venlafaxine XR (EFFEXOR-XR) 75 MG 24 hr capsule Take 2 capsules daily Strength: 75 mg 180 capsule 0   zinc gluconate 50 MG tablet Take 50 mg by mouth daily.     No facility-administered medications prior to visit.    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   Esophageal stricture 06/05/2017   Essential hypertension 02/24/2013   Fatigue 01/20/2020   Fibromyalgia 02/24/2013   Full dentures    Hereditary and idiopathic peripheral neuropathy 02/24/2013   High risk medication use 11/07/2017   Hyperlipidemia 02/24/2013   Hypertension    Hypertensive heart disease 02/24/2013   Incontinence    Insomnia    Longstanding persistent atrial fibrillation ([place]) 09/16/2019   Mild vitamin D deficiency 09/16/2019   Mixed hyperlipidemia 02/24/2013   Murmur, cardiac 08/15/2015   Neuropathy in diabetes ([place]) 10/31/2017   Osteoporosis    Other amnesia    Other amnesia    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    Paroxysmal atrial fibrillation ([place]) 08/15/2015   CHADS2vasc=3 CHADS2vasc=3   Primary insomnia    Restless legs syndrome 03/14/2013   Secondary hypothyroidism 09/16/2019   Thoracic or lumbosacral neuritis or radiculitis 02/24/2013    Type 2 diabetes mellitus, without long-term current use of insulin ([place]) 02/24/2013   Urge incontinence of urine 09/16/2019   Wears glasses     PAST SURGICAL HISTORY: Past Surgical History:  Procedure Laterality Date   A-FLUTTER ABLATION N/A 07/30/2019   Procedure: A-FLUTTER ABLATION;  Surgeon: Constance Haw, MD;  Location: [facility];  Service: Cardiovascular;  Laterality: N/A;   ATRIAL FIBRILLATION ABLATION N/A 03/21/2019   Procedure: ATRIAL FIBRILLATION ABLATION;  Surgeon: Constance Haw, MD;  Location: [facility];  Service: Cardiovascular;  Laterality: N/A;   ATRIAL FIBRILLATION ABLATION N/A 10/27/2021   Procedure: ATRIAL FIBRILLATION ABLATION;  Surgeon: Constance Haw, MD;  Location: [facility];  Service: Cardiovascular;  Laterality: N/A;   [place] STUDY  11/26/2020   Procedure: BUBBLE STUDY;  Surgeon: Pixie Casino, MD;  Location: [facility] ENDOSCOPY;  Service: Cardiovascular;;   CARDIOVERSION N/A 11/26/2020   Procedure: CARDIOVERSION;  Surgeon: Pixie Casino, MD;  Location: [facility] ENDOSCOPY;  Service: Cardiovascular;  Laterality: N/A;   CATARACT EXTRACTION, BILATERAL  2018, 2019   CHOLECYSTECTOMY  1971   open   DIAGNOSTIC LAPAROSCOPY  2010   lysis of adhesions   DILATION AND CURETTAGE OF UTERUS  2004   KNEE ARTHROSCOPY  1998   left   KNEE ARTHROSCOPY W/ LATERAL RETINACULAR REPAIR     MASS EXCISION Left 11/11/2013   Procedure: EXCISION MUCOID CYST LEFT INDEX FINGER/DEBRIDEMENT LEFT INDEX FINGER;  Surgeon: Wynonia Sours, MD;  Location: [facility];  Service: Orthopedics;  Laterality: Left;  ANESTHESIA: IV REGIONAL/FAB   SHOULDER ARTHROSCOPY W/ ROTATOR CUFF REPAIR  2007   left   TEE WITHOUT CARDIOVERSION N/A 11/26/2020   Procedure: TRANSESOPHAGEAL ECHOCARDIOGRAM (TEE);  Surgeon: Pixie Casino, MD;  Location: [facility] ENDOSCOPY;  Service: Cardiovascular;  Laterality: N/A;   TRIGGER FINGER RELEASE Left  11/11/2013   Procedure: RELEASE A-1 PULLEY LEFT RING FINGER;  Surgeon: Wynonia Sours, MD;  Location: [facility];  Service: Orthopedics;  Laterality: Left;   UMBILICAL HERNIA REPAIR  2008, 2010    FAMILY HISTORY: Family History  Problem Relation Age of Onset   Atrial fibrillation Mother    Heart attack Mother    Heart failure Father    Heart disease Brother    Heart attack Brother    Bladder Cancer Brother    Diabetes Maternal Grandmother    Heart failure Paternal Grandfather    Stroke Paternal Grandfather     SOCIAL HISTORY: Social History   Socioeconomic History   Marital status: Married    Spouse name: Liliane Channel   Number of children: 2   Years of education: Not on file   Highest education level: Some college, no degree  Occupational History   Occupation: retired  Tobacco Use   Smoking status: Never   Smokeless tobacco: Never  Vaping Use   Vaping Use: Never used  Substance and Sexual Activity   Alcohol use: Yes    Alcohol/week: 3.0 - 4.0 standard drinks of alcohol    Types: 1 - 2 Glasses of wine, 2 Shots of liquor per week    Comment: 4 times a week   Drug use: No   Sexual activity: Not on file  Other Topics Concern   Not on file  Social History Narrative   Lives at home with husband - one daughter that lives in [place] (adopted) and one son (adopted)   Left handed   Caffeine: caffeine free drinks. 2 cup of coffee a day   Social Determinants of Health   Financial Resource Strain: High Risk (07/04/2021)   Overall Financial Resource Strain (CARDIA)    Difficulty of Paying Living Expenses: Hard  Food Insecurity: No Food Insecurity (12/02/2020)   Hunger Vital Sign    Worried About Running Out of Food in the Last Year: Never true    Ran Out of Food in the Last Year: Never true  Transportation Needs: No Transportation Needs (07/04/2021)   PRAPARE - Hydrologist (Medical): No    Lack of Transportation (Non-Medical): No  Physical  Activity: Not on file  Stress: Not on file  Social Connections: Not on file  Intimate Partner Violence: Not on file     PHYSICAL EXAM  GENERAL EXAM/CONSTITUTIONAL: Vitals:  Vitals:   03/14/22 1424  BP: 122/77  Pulse: (!) 101  Weight: 178 lb 2 oz (80.8 kg)  Height: '5\' 8"'$  (1.727 m)   Body mass index is 27.08 kg/m. Wt Readings from Last 3 Encounters:  03/14/22 178 lb 2 oz (80.8 kg)  03/08/22 177 lb (80.3 kg)  02/20/22 180 lb 6.4 oz (81.8 kg)   Patient is in no distress; well developed, nourished and groomed; neck is supple  CARDIOVASCULAR: Examination of carotid arteries is normal; no carotid bruits Regular rate and rhythm, no murmurs Examination of peripheral vascular system by observation and palpation is normal  EYES: Ophthalmoscopic exam of optic discs and posterior segments is normal; no papilledema or hemorrhages No results found.   MUSCULOSKELETAL: Gait, strength, tone, movements noted in Neurologic exam below  NEUROLOGIC: MENTAL STATUS:     02/20/2022    4:12 PM 02/08/2022    8:55 AM  MMSE - Mini Mental State Exam  Orientation to time 3 3  Orientation to Place 5 4  Registration 3 3  Attention/ Calculation 3 5  Recall 2 3  Language- name 2 objects 2 2  Language- repeat 1 1  Language- follow 3 step command 3 3  Language- read & follow direction 1 1  Write a sentence 1 1  Copy design 1 1  Total score 25 27   awake, alert, oriented to person, place and time recent and remote memory intact normal attention and concentration language fluent, comprehension intact, naming intact fund of knowledge appropriate  CRANIAL NERVE:  2nd - no papilledema on fundoscopic exam 2nd, 3rd, 4th, 6th - pupils equal and reactive to light, visual fields full to confrontation, extraocular muscles intact, no nystagmus 5th - facial sensation symmetric 7th - facial strength symmetric 8th - hearing intact 9th - palate elevates symmetrically, uvula midline 11th - shoulder  shrug symmetric 12th - tongue protrusion midline  MOTOR:  normal bulk and tone, full strength in the BUE, BLE; SLIGHTLY DECR DF IN FEET  SENSORY:  normal and symmetric to light touch, temperature, vibration; EXCEPT DECR VIB AT TOES (RIGHT LEG WORSE THAN LEFT); ABSENT PP IN FEET; DECR IN GRADIENT UP TO SHINS  COORDINATION:  finger-nose-finger, fine finger movements normal  REFLEXES:  deep tendon reflexes TRACE and symmetric; ABSENT AT ANKLES  GAIT/STATION:  narrow based gait; SHORT STEPS; LIMPING ON RIGHT LEG; CANNOT TANDEM, TOE OR HEEL GAIT; ROMBERG POSITIVE     DIAGNOSTIC DATA (LABS, IMAGING, TESTING) - I reviewed patient records, labs, notes, testing and imaging myself where available.  Lab Results  Component Value Date   WBC 7.4 02/08/2022   HGB 14.4 02/08/2022   HCT 43.7 02/08/2022   MCV 94 02/08/2022   PLT 344 02/08/2022      Component Value Date/Time   NA 145 (H) 02/08/2022 0913   K 4.4 02/08/2022 0913   CL 102 02/08/2022 0913   CO2 27 02/08/2022 0913   GLUCOSE 125 (H) 02/08/2022 0913   GLUCOSE 130 (H) 11/26/2020 0926   BUN 12 02/08/2022 0913   CREATININE 1.00 02/08/2022 0913   CALCIUM 9.7 02/08/2022 0913   PROT 6.9 02/08/2022 0913   ALBUMIN 4.6 02/08/2022 0913   AST 29 02/08/2022 0913   ALT 30 02/08/2022 0913   ALKPHOS 100 02/08/2022 0913   BILITOT 0.5 02/08/2022 0913   GFRNONAA 53 (L) 09/03/2020 3875  GFRAA 61 09/03/2020 0953   Lab Results  Component Value Date   CHOL 187 02/08/2022   HDL 87 02/08/2022   LDLCALC 79 02/08/2022   TRIG 126 02/08/2022   CHOLHDL 2.1 02/08/2022   Lab Results  Component Value Date   HGBA1C 6.4 (H) 02/08/2022   Lab Results  Component Value Date   VITAMINB12 598 02/08/2022   Lab Results  Component Value Date   TSH 5.700 (H) 02/08/2022     08/02/18 MRI brain [I reviewed images myself and agree with interpretation. -VRP]  - moderate atrophy - moderate chronic small vessel ischemic disease   02/21/22 MRI  brain 1. No acute intracranial process. 2. Advanced cerebral volume loss for age with sequela of severe chronic small vessel ischemic disease. No disproportionate lobar atrophy. 3. Narrowing of the right cavernous ICA flow void concerning for moderate to severe stenosis. Consider a CTA head and neck for further evaluation.  03/03/22 CTA head / neck 1. Mild for age carotid atherosclerosis. No stenosis at the right cavernous ICA which was highlighted on prior brain MRI. 2. There is high-grade narrowing at the proximal right PCA. 3. Advanced chronic small vessel ischemia. 4. 7 mm colloid cyst.    ASSESSMENT AND PLAN  71 y.o. year old female here with:  Dx:  1. Memory loss       PLAN:  MEMORY LOSS (MMSE 25/30; no major changes in ADLs; do not suspect dementia at this time) - likely related to sleep deprivation and anxiety and chronic small vessel ischemic disease - on donepezil per PCP; however, no strong indication for this at this time - encouraged to start exercise program and try to improve sleep hygiene  GENERAL BALANCE DIFFICULTY - likely related to diabetic neuropathy + low back pain (? lumbar radiculopathy) - consider PT evaluation - use cane / walker as needed  ANXIETY / INSOMNIA - consider psychiatry / psychology evaluation - optimize nutrition / exercise  OSA --> start CPAP (per Dr. Tobie Poet)  TRANSIENT SLURRED SPEECH / APHASIA (TIA) - likely related to atrial fibrillation; now on xarelto - continue diabetes and BP control - intracranial atherosclerosis --> continue medical mgmt  Return for return to PCP.  I spent 25 minutes of face-to-face and non-face-to-face time with patient.  This included previsit chart review, lab review, study review, order entry, electronic health record documentation, patient education.     Penni Bombard, MD 8/50/2774, 1:28 PM Certified in Neurology, Neurophysiology and Neuroimaging  [facility] [address], [place], [place] [address] [phone]

## 2022-03-19 ENCOUNTER — Other Ambulatory Visit: Payer: Self-pay | Admitting: Physician Assistant

## 2022-03-19 DIAGNOSIS — E559 Vitamin D deficiency, unspecified: Secondary | ICD-10-CM

## 2022-03-22 DIAGNOSIS — G4733 Obstructive sleep apnea (adult) (pediatric): Secondary | ICD-10-CM | POA: Diagnosis not present

## 2022-03-25 ENCOUNTER — Other Ambulatory Visit: Payer: Self-pay | Admitting: Physician Assistant

## 2022-03-25 DIAGNOSIS — N3941 Urge incontinence: Secondary | ICD-10-CM

## 2022-03-30 ENCOUNTER — Other Ambulatory Visit: Payer: Self-pay | Admitting: Cardiology

## 2022-03-30 DIAGNOSIS — I4811 Longstanding persistent atrial fibrillation: Secondary | ICD-10-CM

## 2022-03-30 NOTE — Telephone Encounter (Signed)
Prescription refill request for Xarelto received.  Indication:Afib  Last office visit: 02/20/22 April Munoz)  Datil Age:71 Scr: 1.00 (02/08/22)  CrCl: 63.83m/min  Appropriate dose and refill sent to requested pharmacy.

## 2022-03-31 ENCOUNTER — Telehealth: Payer: Self-pay

## 2022-03-31 NOTE — Progress Notes (Unsigned)
Chronic Care Management Pharmacy Assistant   Name: April Munoz  MRN: [id number] DOB: 1951-05-03   Reason for Encounter: Medication Coordination for [facility]    Recent office visits:  03/08/22 [name] MD. Seen for memory problems. No med changes.  02/27/22 [name] MD. Orders Only. Ordered Rosuvastatin '10mg'$ .   02/23/22 [name] MD. Orders Only. Ordered Donepezil '5mg'$  and Losartan '25mg'$ .   02/20/22 Rhae Hammock LPN. Seen for medicare annual wellness. No med changes.  02/17/22 Jackie Plum CMA. Telephone message. Starting synthroid 25 mcg.  02/08/22 [name] MD. Seen for routine visit. Discontinue Trulicity after runs out. Change Effexor XR to 75 mg 2 in AM.  Increase olanzapine to 5 mg nightly. D/C Atorvastatin Calcium '20mg'$  and Colchicine 0.'6mg'$ .   Recent consult visits:  03/14/22 (Neurology) Andrey Spearman MD. Seen for memory loss. No med changes.   02/20/22 (Cardiology) Constance Haw MD. Seen for Atrial Fibrillation. NO med changes.  Hospital visits:  None  Medications: Outpatient Encounter Medications as of 03/31/2022  Medication Sig   ACIDOPHILUS LACTOBACILLUS PO Take 1 capsule by mouth daily.   APPLE CIDER VINEGAR PO Take 480 mg by mouth daily.   B Complex-C-Folic Acid TABS Take 1 tablet by mouth daily.   Continuous Blood Gluc Receiver (FREESTYLE LIBRE 2 READER) DEVI 1 each by Does not apply route as directed. E11.21 Check blood sugar four times daily as directed   cyanocobalamin (,VITAMIN B-12,) 1000 MCG/ML injection INJECT 1 ML INTO THE MUSCLE EVERY 30 DAYS   dapagliflozin propanediol (FARXIGA) 10 MG TABS tablet Take 1 tablet (10 mg total) by mouth daily.   donepezil (ARICEPT) 5 MG tablet Take 1 tablet (5 mg total) by mouth at bedtime.   Dulaglutide (TRULICITY) 0.73 XT/0.6YI SOPN Inject 0.75 mg into the skin once a week. Friday   EPINEPHrine 0.3 mg/0.3 mL IJ SOAJ injection Inject 0.3 mg into the muscle as needed for anaphylaxis.   losartan (COZAAR)  25 MG tablet Take 1 tablet (25 mg total) by mouth daily.   metFORMIN (GLUCOPHAGE) 1000 MG tablet Take 1 tablet (1,000 mg total) by mouth 2 (two) times daily with a meal.   metoprolol succinate (TOPROL-XL) 25 MG 24 hr tablet Take 1 tablet (25 mg total) by mouth in the morning and at bedtime. Taking 2 QD per Cardio per patient   Multiple Vitamin (MULTIVITAMIN WITH MINERALS) TABS tablet Take 1 tablet by mouth daily.   OLANZapine (ZYPREXA) 5 MG tablet Take 1 tablet (5 mg total) by mouth at bedtime.   omeprazole (PRILOSEC) 20 MG capsule Take 1 capsule (20 mg total) by mouth at bedtime.   Probiotic Product (PROBIOTIC & ACIDOPHILUS EX ST PO) Take 1 tablet by mouth daily.    rivaroxaban (XARELTO) 20 MG TABS tablet TAKE ONE TABLET BY MOUTH EVERY MORNING   rosuvastatin (CRESTOR) 10 MG tablet Take 1 tablet (10 mg total) by mouth daily.   solifenacin (VESICARE) 5 MG tablet TAKE 1 TABLET BY MOUTH EVERY DAY   SYNTHROID 25 MCG tablet Take 1 tablet (25 mcg total) by mouth daily before breakfast.   torsemide (DEMADEX) 20 MG tablet Take 1 tablet (20 mg total) by mouth 3 (three) times a week. Please take this on Monday, Wednesday, and Friday.   venlafaxine XR (EFFEXOR-XR) 75 MG 24 hr capsule Take 2 capsules daily Strength: 75 mg   Vitamin D, Ergocalciferol, (DRISDOL) 1.25 MG (50000 UNIT) CAPS capsule TAKE 1 CAPSULE BY MOUTH EVERY MONDAY.   zinc gluconate 50 MG tablet  Take 50 mg by mouth daily.   No facility-administered encounter medications on file as of 03/31/2022.    Reviewed chart for medication changes ahead of medication coordination call.  No hospital visits since last care coordination call/Pharmacist visit.    BP Readings from Last 3 Encounters:  03/14/22 122/77  03/08/22 130/78  02/20/22 132/78    Lab Results  Component Value Date   HGBA1C 6.4 (H) 02/08/2022     Patient obtains medications through Adherence Packaging  90 Days   Last adherence delivery included: Atorvastatin '20mg'$  1  EM Metformin '1000mg'$  1 EM Metoprolol Succ ER '50mg'$  1 BT Womens Multivtiamin 1 B  Olanzapine 2.'5mg'$  1 BT Omeprazole '20mg'$  1 B Solifenacin '5mg'$  1 B Venlafaxine ER '150mg'$  1 B Vitamin D2 1238mg 1 B on Mondays Xarelto '20mg'$  1 B Farxiga '10mg'$   1 B Trulicity 0.'75mg'$ /0.510mInject 0.'75mg'$  once a week on Thursday Epinephrine 0.'3mg'$  Injecdt 0.'3mg'$  into the muscle prn   Patient declined (meds) last month  cyanocobalamin (vit b12) inj and torsemide filled 12/13/21 for 90DS Colchicine 0.'6mg'$ - Temporary fill on 4/23   Patient is due for next adherence delivery on: 04/12/22. Called patient and reviewed medications and coordinated delivery.  This delivery to include: Rosuvastatin '10mg'$  1 B Donepezil '5mg'$  1 BT Metformin '1000mg'$  twice a day  Metoprolol Succ ER '50mg'$  1 BT Womens Multivtiamin 1 B  Olanzapine '5mg'$  1 BT Omeprazole '20mg'$  1 B Solifenacin '5mg'$  1 B Venlafaxine ER '150mg'$  1 B Vitamin D2 125047m1 B on Mondays Xarelto '20mg'$  1 B Farxiga '10mg'$   1 B Trulicity 0.'75mg'$ /0.5ml45mject 0.'75mg'$  once a week on Thursday Epinephrine 0.'3mg'$  Injecdt 0.'3mg'$  into the muscle prn   Patient declined the following medications  Losartan '25mg'$ -Not due until 04/15/22 Synthroid 25mc61mot due until 04/16/22 cyanocobalamin (vit b12) inj filled on 03/10/22 90ds Torsemide filled 12/13/21 for 84DS. Only taking 3 days a week  Colchicine 0.'6mg'$ - Temporary fill on 4/23  Patient needs refills-Request Sent  Rosuvastatin '10mg'$   Donepezil '5mg'$  Venlafaxine ER '150mg'$   Farxiga '10mg'$   Olanzapine '5mg'$   Metformin '1000mg'$   Pt just lost her husband and really could not talk to me about any of her medications. I informed her of the delivery date and just looked through her meds to see if anything changes. I sent the form to [facility].   Confirmed delivery date of 04/12/22, advised patient that pharmacy will contact them the morning of delivery.   DanieElray Mcgregor CRoncevertemacist Assistant  [phone]

## 2022-04-06 ENCOUNTER — Telehealth: Payer: Self-pay | Admitting: Cardiology

## 2022-04-06 MED ORDER — METOPROLOL SUCCINATE ER 25 MG PO TB24: 25.0000 mg | ORAL_TABLET | Freq: Two times a day (BID) | ORAL | 1 refills | Status: DC

## 2022-04-06 NOTE — Telephone Encounter (Signed)
*  STAT* If patient is at the pharmacy, call can be transferred to refill team.   1. Which medications need to be refilled? (please list name of each medication and dose if known) need a new prescription for the correct dose of Metoprolol  2. Which pharmacy/location (including street and city if local pharmacy) is medication to be sent to?[facility]  3. Do they need a 30 day or 90 day supply? 90 days and refills

## 2022-04-07 ENCOUNTER — Other Ambulatory Visit: Payer: Self-pay | Admitting: Nurse Practitioner

## 2022-04-07 ENCOUNTER — Telehealth: Payer: Self-pay

## 2022-04-07 ENCOUNTER — Other Ambulatory Visit: Payer: Self-pay

## 2022-04-07 DIAGNOSIS — F5101 Primary insomnia: Secondary | ICD-10-CM

## 2022-04-07 DIAGNOSIS — F322 Major depressive disorder, single episode, severe without psychotic features: Secondary | ICD-10-CM

## 2022-04-07 DIAGNOSIS — F419 Anxiety disorder, unspecified: Secondary | ICD-10-CM

## 2022-04-07 MED ORDER — DONEPEZIL HCL 5 MG PO TABS: 5.0000 mg | ORAL_TABLET | Freq: Every day | ORAL | 1 refills | Status: DC

## 2022-04-07 MED ORDER — ROSUVASTATIN CALCIUM 10 MG PO TABS: 10.0000 mg | ORAL_TABLET | Freq: Every day | ORAL | 1 refills | Status: DC

## 2022-04-07 MED ORDER — OLANZAPINE 5 MG PO TABS: 5.0000 mg | ORAL_TABLET | Freq: Every day | ORAL | 1 refills | Status: DC

## 2022-04-07 MED ORDER — VENLAFAXINE HCL ER 75 MG PO CP24: ORAL_CAPSULE | ORAL | 1 refills | Status: DC

## 2022-04-07 MED ORDER — METFORMIN HCL 1000 MG PO TABS: 1000.0000 mg | ORAL_TABLET | Freq: Two times a day (BID) | ORAL | 1 refills | Status: DC

## 2022-04-07 MED ORDER — DAPAGLIFLOZIN PROPANEDIOL 10 MG PO TABS: 10.0000 mg | ORAL_TABLET | Freq: Every day | ORAL | 1 refills | Status: DC

## 2022-04-07 MED ORDER — LORAZEPAM 0.5 MG PO TABS: 0.5000 mg | ORAL_TABLET | Freq: Three times a day (TID) | ORAL | 0 refills | Status: DC | PRN

## 2022-04-07 NOTE — Telephone Encounter (Signed)
Patient buried her husband yesterday. She asked if can be sent something to call her down. She has 3 days without sleep.

## 2022-04-07 NOTE — Telephone Encounter (Signed)
Patient was informed.

## 2022-04-21 ENCOUNTER — Telehealth: Payer: Self-pay

## 2022-04-21 DIAGNOSIS — G4733 Obstructive sleep apnea (adult) (pediatric): Secondary | ICD-10-CM | POA: Diagnosis not present

## 2022-04-21 NOTE — Progress Notes (Signed)
According to April Munoz pt declined this entire delivery. She stated she had enough supply. She has no idea how she got ahead but she stated she just finished her packs today and she has bottles left of everything from a previous delivery. I went through all her meds due and she stated she has enough of everything to last her for another 2 weeks give or take. She requested her new delivery to be sent out on 05/10/22 to start. I told pt if she does happen to run out of anything before then, to call me. She understood. I let the pharmacy know of everything.  Elray Mcgregor, [place] Pharmacist Assistant  [phone]

## 2022-04-21 NOTE — Progress Notes (Signed)
Error

## 2022-04-26 ENCOUNTER — Telehealth: Payer: Self-pay

## 2022-04-26 NOTE — Chronic Care Management (AMB) (Signed)
Chronic Care Management Pharmacy Assistant   Name: April Munoz  MRN: [id number] DOB: 07/12/1951   Reason for Encounter: Medication Review/ Medication coordination  Recent office visits:  None  Recent consult visits:  None  Hospital visits:  None in previous 6 months  Medications: Outpatient Encounter Medications as of 04/26/2022  Medication Sig   ACIDOPHILUS LACTOBACILLUS PO Take 1 capsule by mouth daily.   APPLE CIDER VINEGAR PO Take 480 mg by mouth daily.   B Complex-C-Folic Acid TABS Take 1 tablet by mouth daily.   Continuous Blood Gluc Receiver (FREESTYLE LIBRE 2 READER) DEVI 1 each by Does not apply route as directed. E11.21 Check blood sugar four times daily as directed   cyanocobalamin (,VITAMIN B-12,) 1000 MCG/ML injection INJECT 1 ML INTO THE MUSCLE EVERY 30 DAYS   dapagliflozin propanediol (FARXIGA) 10 MG TABS tablet Take 1 tablet (10 mg total) by mouth daily.   donepezil (ARICEPT) 5 MG tablet Take 1 tablet (5 mg total) by mouth at bedtime.   Dulaglutide (TRULICITY) 0.97 DZ/3.2DJ SOPN Inject 0.75 mg into the skin once a week. Friday   EPINEPHrine 0.3 mg/0.3 mL IJ SOAJ injection Inject 0.3 mg into the muscle as needed for anaphylaxis.   LORazepam (ATIVAN) 0.5 MG tablet Take 1 tablet (0.5 mg total) by mouth every 8 (eight) hours as needed for anxiety.   losartan (COZAAR) 25 MG tablet Take 1 tablet (25 mg total) by mouth daily.   metFORMIN (GLUCOPHAGE) 1000 MG tablet Take 1 tablet (1,000 mg total) by mouth 2 (two) times daily with a meal.   metoprolol succinate (TOPROL-XL) 25 MG 24 hr tablet Take 1 tablet (25 mg total) by mouth in the morning and at bedtime. Taking 2 QD per Cardio per patient   Multiple Vitamin (MULTIVITAMIN WITH MINERALS) TABS tablet Take 1 tablet by mouth daily.   OLANZapine (ZYPREXA) 5 MG tablet Take 1 tablet (5 mg total) by mouth at bedtime.   omeprazole (PRILOSEC) 20 MG capsule Take 1 capsule (20 mg total) by mouth at bedtime.   Probiotic Product  (PROBIOTIC & ACIDOPHILUS EX ST PO) Take 1 tablet by mouth daily.    rivaroxaban (XARELTO) 20 MG TABS tablet TAKE ONE TABLET BY MOUTH EVERY MORNING   rosuvastatin (CRESTOR) 10 MG tablet Take 1 tablet (10 mg total) by mouth daily.   solifenacin (VESICARE) 5 MG tablet TAKE 1 TABLET BY MOUTH EVERY DAY   SYNTHROID 25 MCG tablet Take 1 tablet (25 mcg total) by mouth daily before breakfast.   torsemide (DEMADEX) 20 MG tablet Take 1 tablet (20 mg total) by mouth 3 (three) times a week. Please take this on Monday, Wednesday, and Friday.   triamcinolone ointment (KENALOG) 0.1 % Apply topically 2 (two) times daily.   venlafaxine XR (EFFEXOR-XR) 75 MG 24 hr capsule Take 2 capsules daily Strength: 75 mg   Vitamin D, Ergocalciferol, (DRISDOL) 1.25 MG (50000 UNIT) CAPS capsule TAKE 1 CAPSULE BY MOUTH EVERY MONDAY.   zinc gluconate 50 MG tablet Take 50 mg by mouth daily.   No facility-administered encounter medications on file as of 04/26/2022.  Reviewed chart for medication changes ahead of medication coordination call.  No medication changes indicated   BP Readings from Last 3 Encounters:  03/14/22 122/77  03/08/22 130/78  02/20/22 132/78    Lab Results  Component Value Date   HGBA1C 6.4 (H) 02/08/2022     Patient obtains medications through Adherence Packaging  90 Days   Last adherence delivery included:  Patient declined delivery  Patient declined (meds) last month due: All medications- Plenty supply  Patient is due for next adherence delivery on: 05-08-2022  Called patient and reviewed medications and coordinated delivery.  This delivery to include: Metformin 1000 mg- 1 at B 1 at BT Metoprolol Succ ER 25 mg- 1 at B 1 at [place] at B  Olanzapine 5 mg- 1 at BT Omeprazole 20 mg- 1 at B Venlafaxine ER 150 mg- 1 at B Vitamin D2 1250 mcg- 1 at B on Mondays Xarelto 20 mg- 1 at B Farxiga 10 mg-  1 at B Trulicity 0.'75mg'$ /0.49m Inject 0.'75mg'$  once a week on  Thursday Epinephrine 0.'3mg'$  Injecdt 0.'3mg'$  into the muscle prn  Donepezil 5 mg- 1 at BT Rosuvastatin 10 mg- 1 at B Synthroid 25 mcg- 1 BB Losartan 25 mg- 1 at BT Torsemide 20 mg- 1 at B on MWF  No acute/short fill needed  Patient declined the following medications: Trulicity- D/C Solifenacin- Filled 03-31-2022 90 DS [facility] Ativan- No longer takes  Patient needs refills for: Request sent by [facility] Olanzapine  Confirmed delivery date of 05-08-2022 advised patient that pharmacy will contact them the morning of delivery.   Care Gaps: Hep C screening overdue  Star Rating Drugs: Farxiga 10 mg- Last filled 01-06-2022 90 DS Rosuvastatin 10 mg- Last filled 02-27-2022 30 DS Losartan 25 mg- Last filled 02-27-2022 30 DS Metformin 1000 mg- Last filled [id number] DS Trulicity 05.37mg- Last filled 01-06-2022 90 DS  MT[place]onshaClinical Pharmacist Assistant [phone]

## 2022-04-27 ENCOUNTER — Other Ambulatory Visit: Payer: Self-pay

## 2022-04-27 ENCOUNTER — Other Ambulatory Visit: Payer: Self-pay | Admitting: Family Medicine

## 2022-05-17 ENCOUNTER — Telehealth: Payer: Self-pay

## 2022-05-17 NOTE — Chronic Care Management (AMB) (Signed)
     05-17-2022: Contacted patient for appointment reminder on 05-18-2022 with [place] Constable. Patient rescheduled to December due to not being available.  Care Gaps: Hep C screening overdue Tdap overdue Shingrix overdue Covid booster overdue Foot exam overdue Yearly ophthalmology overdue Flu vaccine overdue Mammogram overdue  Star Rating Drug: Farxiga 10 mg- Last filled 05-03-2022 30 DS Rosuvastatin 10 mg- Last filled 05-03-2022 90 DS Losartan 25 mg- Last filled 05-03-2022 90 DS Metformin 1000 mg- Last filled 05-03-2022 90 DS  Any gaps in medications fill history? No  [place] Pharmacist Assistant [phone]

## 2022-05-18 ENCOUNTER — Telehealth: Payer: PPO

## 2022-05-19 ENCOUNTER — Encounter: Payer: PPO | Admitting: Family Medicine

## 2022-05-19 NOTE — Progress Notes (Deleted)
Subjective:  Patient ID: April Munoz, female    DOB: 1951-05-04  Age: 71 y.o. MRN: [id number]  Chief Complaint  Patient presents with   Diabetes   Hyperlipidemia   Hypertension    Diabetes Pertinent negatives for hypoglycemia include no dizziness or headaches. Pertinent negatives for diabetes include no chest pain and no fatigue.  Hyperlipidemia Pertinent negatives include no chest pain, myalgias or shortness of breath.  Hypertension Pertinent negatives include no chest pain, headaches or shortness of breath.     Diabetes: Patient is currently taking Farxiga 10 mg daily, Metformin 1000 mg twice a day. Has 4 more shots of trulicity 2.53 mg weekly, but is excessively expensive and cannot afford to take it.  Sugars 100-140. Marland Kitchen   Checks feet daily.  Gets annual eye exams.   Hyperlipidemia:Patient is off all cholesterol medicines. Patient does not know why.   Hypertension:  Currently taking Losartan 50 mg daily. Toprol xl 25 mg twice daily.   Afib: S/P cardiac ablation x 3. No palpitations since recent ablation. Xarelto 20 mg daily, metoprolol 50 mg daily.  Depression: Effexor xr 150 mg daily. I added olanzepine 2.5 mg before bed. Helps briefly, but than returns.     Current Outpatient Medications on File Prior to Visit  Medication Sig Dispense Refill   ACIDOPHILUS LACTOBACILLUS PO Take 1 capsule by mouth daily.     APPLE CIDER VINEGAR PO Take 480 mg by mouth daily.     B Complex-C-Folic Acid TABS Take 1 tablet by mouth daily.     Continuous Blood Gluc Receiver (FREESTYLE LIBRE 2 READER) DEVI 1 each by Does not apply route as directed. E11.21 Check blood sugar four times daily as directed 1 each 0   cyanocobalamin (,VITAMIN B-12,) 1000 MCG/ML injection INJECT 1 ML INTO THE MUSCLE EVERY 30 DAYS 3 mL 3   dapagliflozin propanediol (FARXIGA) 10 MG TABS tablet Take 1 tablet (10 mg total) by mouth daily. 90 tablet 1   donepezil (ARICEPT) 5 MG tablet Take 1 tablet (5 mg total) by mouth  at bedtime. 90 tablet 1   EPINEPHrine 0.3 mg/0.3 mL IJ SOAJ injection Inject 0.3 mg into the muscle as needed for anaphylaxis. 1 each 1   LORazepam (ATIVAN) 0.5 MG tablet Take 1 tablet (0.5 mg total) by mouth every 8 (eight) hours as needed for anxiety. 10 tablet 0   losartan (COZAAR) 25 MG tablet Take 1 tablet (25 mg total) by mouth daily. 90 tablet 1   metFORMIN (GLUCOPHAGE) 1000 MG tablet Take 1 tablet (1,000 mg total) by mouth 2 (two) times daily with a meal. 180 tablet 1   metoprolol succinate (TOPROL-XL) 25 MG 24 hr tablet Take 1 tablet (25 mg total) by mouth in the morning and at bedtime. Taking 2 QD per Cardio per patient 180 tablet 1   Multiple Vitamin (MULTIVITAMIN WITH MINERALS) TABS tablet Take 1 tablet by mouth daily.     OLANZapine (ZYPREXA) 5 MG tablet Take 1 tablet (5 mg total) by mouth at bedtime. 90 tablet 1   omeprazole (PRILOSEC) 20 MG capsule Take 1 capsule (20 mg total) by mouth at bedtime. 90 capsule 1   Probiotic Product (PROBIOTIC & ACIDOPHILUS EX ST PO) Take 1 tablet by mouth daily.      rivaroxaban (XARELTO) 20 MG TABS tablet TAKE ONE TABLET BY MOUTH EVERY MORNING 90 tablet 1   rosuvastatin (CRESTOR) 10 MG tablet Take 1 tablet (10 mg total) by mouth daily. 90 tablet 1   solifenacin (  VESICARE) 5 MG tablet TAKE 1 TABLET BY MOUTH EVERY DAY 90 tablet 0   SYNTHROID 25 MCG tablet Take 1 tablet (25 mcg total) by mouth daily before breakfast. 90 tablet 1   torsemide (DEMADEX) 20 MG tablet Take 1 tablet (20 mg total) by mouth 3 (three) times a week. Please take this on Monday, Wednesday, and Friday. 90 tablet 0   triamcinolone ointment (KENALOG) 0.1 % Apply topically 2 (two) times daily.     venlafaxine XR (EFFEXOR-XR) 75 MG 24 hr capsule Take 2 capsules daily Strength: 75 mg 180 capsule 1   Vitamin D, Ergocalciferol, (DRISDOL) 1.25 MG (50000 UNIT) CAPS capsule TAKE 1 CAPSULE BY MOUTH EVERY MONDAY. 6 capsule 0   zinc gluconate 50 MG tablet Take 50 mg by mouth daily.     No  current facility-administered medications on file prior to visit.   Past Medical History:  Diagnosis Date   Anxiety    Arthritis    Ataxic gait    Atrial fibrillation (HCC)    Atrophy of thyroid    B12 deficiency 10/31/2017   Cellulitis of abdominal wall 07/07/2016   Chronic anticoagulation 05/31/2018   Depression    Depression, major, recurrent, mild ([place]) 09/16/2019   Diabetes mellitus without complication ([place])    Diabetic glomerulopathy ([place]) 09/16/2019   Esophageal stricture 06/05/2017   Essential hypertension 02/24/2013   Fatigue 01/20/2020   Fibromyalgia 02/24/2013   Full dentures    Hereditary and idiopathic peripheral neuropathy 02/24/2013   High risk medication use 11/07/2017   Hyperlipidemia 02/24/2013   Hypertension    Hypertensive heart disease 02/24/2013   Incontinence    Insomnia    Longstanding persistent atrial fibrillation ([place]) 09/16/2019   Mild vitamin D deficiency 09/16/2019   Mixed hyperlipidemia 02/24/2013   Murmur, cardiac 08/15/2015   Neuropathy in diabetes ([place]) 10/31/2017   Osteoporosis    Other amnesia    Other amnesia    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    Paroxysmal atrial fibrillation ([place]) 08/15/2015   CHADS2vasc=3 CHADS2vasc=3   Primary insomnia    Restless legs syndrome 03/14/2013   Secondary hypothyroidism 09/16/2019   Thoracic or lumbosacral neuritis or radiculitis 02/24/2013   Type 2 diabetes mellitus, without long-term current use of insulin ([place]) 02/24/2013   Urge incontinence of urine 09/16/2019   Wears glasses    Past Surgical History:  Procedure Laterality Date   A-FLUTTER ABLATION N/A 07/30/2019   Procedure: A-FLUTTER ABLATION;  Surgeon: Constance Haw, MD;  Location: [place];  Service: Cardiovascular;  Laterality: N/A;   ATRIAL FIBRILLATION ABLATION N/A 03/21/2019   Procedure: ATRIAL FIBRILLATION ABLATION;  Surgeon: Constance Haw, MD;  Location: [facility];  Service: Cardiovascular;   Laterality: N/A;   ATRIAL FIBRILLATION ABLATION N/A 10/27/2021   Procedure: ATRIAL FIBRILLATION ABLATION;  Surgeon: Constance Haw, MD;  Location: [place];  Service: Cardiovascular;  Laterality: N/A;   [place] STUDY  11/26/2020   Procedure: BUBBLE STUDY;  Surgeon: Pixie Casino, MD;  Location: [facility] ENDOSCOPY;  Service: Cardiovascular;;   CARDIOVERSION N/A 11/26/2020   Procedure: CARDIOVERSION;  Surgeon: Pixie Casino, MD;  Location: [facility] ENDOSCOPY;  Service: Cardiovascular;  Laterality: N/A;   CATARACT EXTRACTION, BILATERAL  2018, 2019   CHOLECYSTECTOMY  1971   open   DIAGNOSTIC LAPAROSCOPY  2010   lysis of adhesions   DILATION AND CURETTAGE OF UTERUS  2004   KNEE  ARTHROSCOPY  1998   left   KNEE ARTHROSCOPY W/ LATERAL RETINACULAR REPAIR     MASS EXCISION Left 11/11/2013   Procedure: EXCISION MUCOID CYST LEFT INDEX FINGER/DEBRIDEMENT LEFT INDEX FINGER;  Surgeon: Wynonia Sours, MD;  Location: [place];  Service: Orthopedics;  Laterality: Left;  ANESTHESIA: IV REGIONAL/FAB   SHOULDER ARTHROSCOPY W/ ROTATOR CUFF REPAIR  2007   left   TEE WITHOUT CARDIOVERSION N/A 11/26/2020   Procedure: TRANSESOPHAGEAL ECHOCARDIOGRAM (TEE);  Surgeon: Pixie Casino, MD;  Location: [facility] ENDOSCOPY;  Service: Cardiovascular;  Laterality: N/A;   TRIGGER FINGER RELEASE Left 11/11/2013   Procedure: RELEASE A-1 PULLEY LEFT RING FINGER;  Surgeon: Wynonia Sours, MD;  Location: [facility];  Service: Orthopedics;  Laterality: Left;   UMBILICAL HERNIA REPAIR  2008, 2010    Family History  Problem Relation Age of Onset   Atrial fibrillation Mother    Heart attack Mother    Heart failure Father    Heart disease Brother    Heart attack Brother    Bladder Cancer Brother    Diabetes Maternal Grandmother    Heart failure Paternal Grandfather    Stroke Paternal Grandfather    Social History   Socioeconomic History   Marital status: Married     Spouse name: Liliane Channel   Number of children: 2   Years of education: Not on file   Highest education level: Some college, no degree  Occupational History   Occupation: retired  Tobacco Use   Smoking status: Never   Smokeless tobacco: Never  Vaping Use   Vaping Use: Never used  Substance and Sexual Activity   Alcohol use: Yes    Alcohol/week: 3.0 - 4.0 standard drinks of alcohol    Types: 1 - 2 Glasses of wine, 2 Shots of liquor per week    Comment: 4 times a week   Drug use: No   Sexual activity: Not on file  Other Topics Concern   Not on file  Social History Narrative   Lives at home with husband - one daughter that lives in [place] (adopted) and one son (adopted)   Left handed   Caffeine: caffeine free drinks. 2 cup of coffee a day   Social Determinants of Health   Financial Resource Strain: High Risk (07/04/2021)   Overall Financial Resource Strain (CARDIA)    Difficulty of Paying Living Expenses: Hard  Food Insecurity: No Food Insecurity (12/02/2020)   Hunger Vital Sign    Worried About Running Out of Food in the Last Year: Never true    Ran Out of Food in the Last Year: Never true  Transportation Needs: No Transportation Needs (07/04/2021)   PRAPARE - Hydrologist (Medical): No    Lack of Transportation (Non-Medical): No  Physical Activity: Not on file  Stress: Not on file  Social Connections: Not on file    Review of Systems  Constitutional:  Negative for chills and fatigue.  HENT:  Negative for congestion, ear pain, nosebleeds, sinus pain and sore throat.   Respiratory:  Negative for cough and shortness of breath.   Cardiovascular:  Negative for chest pain and leg swelling.  Gastrointestinal:  Negative for abdominal pain, constipation, diarrhea, nausea and vomiting.  Genitourinary:  Negative for dysuria and frequency.  Musculoskeletal:  Negative for arthralgias, back pain and myalgias.  Neurological:  Negative for dizziness and headaches.      Objective:  There were no vitals taken for this  visit.     03/14/2022    2:24 PM 03/08/2022   11:31 AM 02/20/2022    4:03 PM  BP/Weight  Systolic BP 754 492 010  Diastolic BP 77 78 78  Wt. (Lbs) 178.13 177 180.4  BMI 27.08 kg/m2 27.72 kg/m2 28.25 kg/m2    Physical Exam  Diabetic Foot Exam - Simple   No data filed      Lab Results  Component Value Date   WBC 7.4 02/08/2022   HGB 14.4 02/08/2022   HCT 43.7 02/08/2022   PLT 344 02/08/2022   GLUCOSE 125 (H) 02/08/2022   CHOL 187 02/08/2022   TRIG 126 02/08/2022   HDL 87 02/08/2022   LDLCALC 79 02/08/2022   ALT 30 02/08/2022   AST 29 02/08/2022   NA 145 (H) 02/08/2022   K 4.4 02/08/2022   CL 102 02/08/2022   CREATININE 1.00 02/08/2022   BUN 12 02/08/2022   CO2 27 02/08/2022   TSH 5.700 (H) 02/08/2022   HGBA1C 6.4 (H) 02/08/2022   MICROALBUR 80 03/15/2021      Assessment & Plan:   Problem List Items Addressed This Visit       Cardiovascular and Mediastinum   Hypertensive heart disease - Primary   Atrial fibrillation ([place])     Endocrine   Neuropathy in diabetes ([place])   Diabetic glomerulopathy ([place])     Nervous and Auditory   DAT (dementia of Alzheimer type) ([place])     Hematopoietic and Hemostatic   Acquired thrombophilia ([place])     Other   Mixed hyperlipidemia   Major depressive disorder, recurrent episode, moderate (HCC)   Vitamin D insufficiency  .  No orders of the defined types were placed in this encounter.   No orders of the defined types were placed in this encounter.    Follow-up: No follow-ups on file.  An After Visit Summary was printed and given to the patient.  [name], MD [facility] [phone]

## 2022-05-21 NOTE — Progress Notes (Signed)
This encounter was created in error - please disregard.

## 2022-05-22 DIAGNOSIS — G4733 Obstructive sleep apnea (adult) (pediatric): Secondary | ICD-10-CM | POA: Diagnosis not present

## 2022-05-23 ENCOUNTER — Other Ambulatory Visit: Payer: Self-pay | Admitting: Family Medicine

## 2022-06-14 DIAGNOSIS — N182 Chronic kidney disease, stage 2 (mild): Secondary | ICD-10-CM | POA: Diagnosis not present

## 2022-06-21 DIAGNOSIS — G4733 Obstructive sleep apnea (adult) (pediatric): Secondary | ICD-10-CM | POA: Diagnosis not present

## 2022-06-23 ENCOUNTER — Ambulatory Visit (INDEPENDENT_AMBULATORY_CARE_PROVIDER_SITE_OTHER): Payer: PPO

## 2022-06-23 DIAGNOSIS — E782 Mixed hyperlipidemia: Secondary | ICD-10-CM

## 2022-06-23 DIAGNOSIS — I110 Hypertensive heart disease with heart failure: Secondary | ICD-10-CM

## 2022-06-23 DIAGNOSIS — F331 Major depressive disorder, recurrent, moderate: Secondary | ICD-10-CM

## 2022-06-23 DIAGNOSIS — I4891 Unspecified atrial fibrillation: Secondary | ICD-10-CM

## 2022-06-23 NOTE — Patient Instructions (Signed)
Visit Information   Goals Addressed   None    Patient Care Plan: CCM Pharmacy Care Plan     Problem Identified: afib, diabetes, hypertension, hyperlipidemia   Priority: High  Onset Date: 08/30/2020     Goal: Disease Management   Start Date: 08/30/2020  Expected End Date: 08/30/2021  Recent Progress: On track  Priority: High  Note:   Current Barriers:  Unable to independently afford treatment regimen Unable to maintain control of diabetes  Pharmacist Clinical Goal(s):  Over the next 90 days, patient will verbalize ability to afford treatment regimen achieve control of diabetes as evidenced by a1c through collaboration with PharmD and provider.   Interventions: 1:1 collaboration with Cox, Kirsten, MD regarding development and update of comprehensive plan of care as evidenced by provider attestation and co-signature Inter-disciplinary care team collaboration (see longitudinal plan of care) Comprehensive medication review performed; medication list updated in electronic medical record  Cardio (BP goal <140/90) BP Readings from Last 3 Encounters:  03/14/22 122/77  03/08/22 130/78  02/20/22 132/78  -controlled -Current treatment: Losartan 25 mg daily Appropriate, Query effective, Safe, Accessible Metoprolol XL 25mg BID Appropriate, Query effective, Safe, Accessible -Medications previously tried: none reported  -Current home readings:   -Jan 2023: 140-150 systolic but didn't have numbers  -December 2023:   Didn't have list on her today -Current dietary habits: reports eating during the night some.  -Current exercise habits: none reported  -Denies hypotensive/hypertensive symptoms -Educated on BP goals and benefits of medications for prevention of heart attack, stroke and kidney damage; Daily salt intake goal < 2300 mg; Exercise goal of 150 minutes per week; Importance of home blood pressure monitoring; Proper BP monitoring technique; -Counseled to monitor BP at home  daily, document, and provide log at future appointments -Counseled on diet and exercise extensively Jan 2023: Patient states 140-150 systolic normal but doesn't write down. Has appt with Cardio next week. Will write down values and bring into Cardio visit April 2023: Patient would like Metoprolol Succ 50mg changed to 25mg Succ BID  Hyperlipidemia: (LDL goal < 55) The 10-year ASCVD risk score (Arnett DK, et al., 2019) is: 27.2%   Values used to calculate the score:     Age: 71 years     Sex: Female     Is Non-Hispanic African American: No     Diabetic: Yes     Tobacco smoker: No     Systolic Blood Pressure: 122 mmHg     Is BP treated: Yes     HDL Cholesterol: 87 mg/dL     Total Cholesterol: 187 mg/dL Lab Results  Component Value Date   CHOL 187 02/08/2022   CHOL 219 (H) 06/15/2021   CHOL 187 03/10/2021   Lab Results  Component Value Date   HDL 87 02/08/2022   HDL 75 06/15/2021   HDL 62 03/10/2021   Lab Results  Component Value Date   LDLCALC 79 02/08/2022   LDLCALC 115 (H) 06/15/2021   LDLCALC 94 03/10/2021   Lab Results  Component Value Date   TRIG 126 02/08/2022   TRIG 168 (H) 06/15/2021   TRIG 181 (H) 03/10/2021   Lab Results  Component Value Date   CHOLHDL 2.1 02/08/2022   CHOLHDL 2.9 06/15/2021   CHOLHDL 3.0 03/10/2021  No results found for: "LDLDIRECT" -not ideally controlled -Current treatment: Rosuvastatin 10 mg daily Appropriate, Effective, Safe, Accessible -Medications previously tried: pravastatin, Lovaza -Current dietary patterns: reports eating at night. Loves eggs.  -Current exercise habits: none reported -  Educated on Cholesterol goals;  Benefits of statin for ASCVD risk reduction; Importance of limiting foods high in cholesterol; Exercise goal of 150 minutes per week; -Counseled on diet and exercise extensively Jan 2023: Recommend increasing statin December 2023: Recommended to continue current medication  Diabetes (A1c goal <7%) Lab Results   Component Value Date   HGBA1C 6.4 (H) 02/08/2022   HGBA1C 6.1 (H) 06/15/2021   HGBA1C 6.1 (H) 03/10/2021   Lab Results  Component Value Date   MICROALBUR 80 03/15/2021   LDLCALC 79 02/08/2022   CREATININE 1.00 02/08/2022   Lab Results  Component Value Date   NA 145 (H) 02/08/2022   K 4.4 02/08/2022   CREATININE 1.00 02/08/2022   EGFR 60 02/08/2022   GFRNONAA 53 (L) 09/03/2020   GLUCOSE 125 (H) 02/08/2022   Lab Results  Component Value Date   WBC 7.4 02/08/2022   HGB 14.4 02/08/2022   HCT 43.7 02/08/2022   MCV 94 02/08/2022   PLT 344 02/08/2022  -Controlled -Current medications: metformin 1000 mg daily Appropriate, Effective, Safe, Accessible Farxiga 10 mg daily (PAP) Appropriate, Effective, Safe, Accessible Trulicity 9.48 mg weekly (PAP) Appropriate, Effective, Safe, Accessible -Medications previously tried: none reported  -Current home glucose readings fasting glucose: Jan 2023: Doesn't write them down but states no lows. Recommended to start writing down post prandial glucose: not reported -Denies hypoglycemic/hyperglycemic symptoms -Current meal patterns:  Patient reports eating during the night.  Not eating well since cardioversion.  -Current exercise: none reported -Educated onA1c and blood sugar goals; Complications of diabetes including kidney damage, retinal damage, and cardiovascular disease; Benefits of weight loss; Benefits of routine self-monitoring of blood sugar; -Counseled to check feet daily and get yearly eye exams -Counseled on diet and exercise extensively April 2023: Will ask PCP to change Metformin BID to QD which is how patient is taking medication December 2023: Patient has been getting meds from [facility] when PAP was approved. Will have CCM team lok into why and fix ASAP   Atrial Fibrillation (Goal: prevent stroke and major bleeding) -controlled  -CHADSVASC: 6 -Current treatment: Rate control:  metoprolol xl 98m BID Appropriate,  Effective, Safe, Accessible Losartan 28mQD Appropriate, Effective, Safe, Accessible Anticoagulation:  Xarelto 20 mg daily Appropriate, Effective, Safe, Accessible -Medications previously tried: Eliquis  -Home BP and HR readings: none reported  -Counseled on increased risk of stroke due to Afib and benefits of anticoagulation for stroke prevention; importance of adherence to anticoagulant exactly as prescribed; avoidance of NSAIDs due to increased bleeding risk with anticoagulants; -Counseled on diet and exercise extensively April 2023: Patient would like Metoprolol Succ 5071mhanged to 41m17mcc BID  Osteoporosis / Osteopenia (Goal  Minimize risk of break or fracture) -controlled -Last DEXA Scan:  Dual Femur Neck Right:  04/01/21: -2.0 08/05/18: -1.56 03/31/16:-1.6 03/25/09:-1.4 12/03/01: -0.8 Dual Femur Total Mean:  04/01/21: -1.5 08/05/18: -1.1 03/31/16: -0.7  03/25/09: -0.7 12/03/01: -0.0  -Current treatment  calcium/vitamin D/vitamin k chew daily Appropriate, Effective, Safe, Accessible Vitamin D 50,000 units weekly on Monday Appropriate, Effective, Safe, Accessible -Medications previously tried: none reported  -Recommend [phone] units of vitamin D daily. Recommend 1200 mg of calcium daily from dietary and supplemental sources. Recommend weight-bearing and muscle strengthening exercises for building and maintaining bone density. -Counseled on diet and exercise extensively Jan 2023: Patient unsure when last Dexa was, will ask PCP April 2023: DEXA scheduled for 12/14/21 December 2023: No need for Dexa until next year  Depression (Goal: manage symptoms of depression) -not ideally  controlled    06/23/2022   10:06 AM 03/08/2022   11:12 AM 02/20/2022    4:10 PM  Depression screen PHQ 2/9  Decreased Interest _0 Down, Depressed, Hopeless _1 PHQ - 2 Score _2 Altered sleeping _3 Tired, decreased energy _4 Change in appetite 0 1 3  Feeling bad or failure about  yourself  _5 Trouble concentrating _6 Moving slowly or fidgety/restless _7 Suicidal thoughts 0 0 0  PHQ-9 Score _8 Difficult doing work/chores Extremely dIfficult Extremely dIfficult Very difficult  -Current treatment  Venlafaxine XR 24m Appropriate, Query effective, Safe, Accessible Olanzapine 5243mHS Appropriate, Query effective, Query Safe, Accessible Solifenacin 43m57mS Appropriate, Query effective, Query Safe, Accessible -Medications previously tried: Trintellix, Rexulti, states she has tried other medications in the past but has been on venlafaxine of and on for 30 years, venlafaxine Jan 2023: Patient states her grandson and Fiance just moved in with her and she is happy to help them, but stated it is a stressful situation. Not open to changing things now with all the change that's going on in her life April 2023: Patient's brother passed last week, unable to discuss this too much today. [facility]: Within 6 months, her brother and husband passed. Recommend she call hospice (From husband) and talk with grief counselor. Recommend she come in to see PCP ASAP    Patient Goals/Self-Care Activities Over the next 90 days, patient will:  - take medications as prescribed focus on medication adherence by using pill box check glucose daily , document, and provide at future appointments check blood pressure weekly, document, and provide at future appointments  Follow Up Plan: Telephone follow up appointment with care management team member scheduled for: August 2024  NatArizona ConstablehaSherian Rein - [phone]- [phone]   Ms. SkeThreatss given information about Chronic Care Management services today including:  CCM service includes personalized support from designated clinical staff supervised by her physician, including individualized plan of care and coordination with other care providers 24/7 contact phone numbers for assistance for urgent and routine care  needs. Standard insurance, coinsurance, copays and deductibles apply for chronic care management only during months in which we provide at least 20 minutes of these services. Most insurances cover these services at 100%, however patients may be responsible for any copay, coinsurance and/or deductible if applicable. This service may help you avoid the need for more expensive face-to-face services. Only one practitioner may furnish and bill the service in a calendar month. The patient may stop CCM services at any time (effective at the end of the month) by phone call to the office staff.  Patient agreed to services and verbal consent obtained.   The patient verbalized understanding of instructions, educational materials, and care plan provided today and DECLINED offer to receive copy of patient instructions, educational materials, and care plan.  The pharmacy team will reach out to the patient again over the next 60 days.   NatLane HackerPHKay

## 2022-06-23 NOTE — Progress Notes (Signed)
Chronic Care Management Pharmacy Note  06/23/2022 Name:  April Munoz MRN:  [id number] DOB:  1950-09-01   Plan Recommendations:  No f/u with PCP scheduled. Coordinated with Nursing staff. Patient's PHQ9 has gotten worse so recommend she come in ASAP. Also counseled her to talk to grief counselor from Hospice (Per PCP note) Re-new PAP of medications   Subjective: April Munoz is an 70 y.o. year old female who is a primary patient of Cox, Kirsten, MD.  The CCM team was consulted for assistance with disease management and care coordination needs.    Engaged with patient face to face for follow up visit in response to provider referral for pharmacy case management and/or care coordination services.   Consent to Services:  The patient was given the following information about Chronic Care Management services today, agreed to services, and gave verbal consent: 1. CCM service includes personalized support from designated clinical staff supervised by the primary care provider, including individualized plan of care and coordination with other care providers 2. 24/7 contact phone numbers for assistance for urgent and routine care needs. 3. Service will only be billed when office clinical staff spend 20 minutes or more in a month to coordinate care. 4. Only one practitioner may furnish and bill the service in a calendar month. 5.The patient may stop CCM services at any time (effective at the end of the month) by phone call to the office staff. 6. The patient will be responsible for cost sharing (co-pay) of up to 20% of the service fee (after annual deductible is met). Patient agreed to services and consent obtained.  Patient Care Team: [name], MD as PCP - General (Family Medicine) Richardo Priest, MD as PCP - Cardiology (Cardiology) Constance Haw, MD as PCP - Electrophysiology (Cardiology) Richardo Priest, MD as Consulting Physician (Cardiology) Misenheimer, Christia Reading, MD as Consulting Physician  (Unknown Physician Specialty) Pollyann Samples, MD as Consulting Physician (General Surgery) Lane Hacker, [facility] as Pharmacist (Pharmacist)  Recent office visits:  None   Recent consult visits:  None   Hospital visits:  None in previous 6 month   Objective:  Lab Results  Component Value Date   CREATININE 1.00 02/08/2022   BUN 12 02/08/2022   GFRNONAA 53 (L) 09/03/2020   GFRAA 61 09/03/2020   NA 145 (H) 02/08/2022   K 4.4 02/08/2022   CALCIUM 9.7 02/08/2022   CO2 27 02/08/2022    Lab Results  Component Value Date/Time   HGBA1C 6.4 (H) 02/08/2022 09:13 AM   HGBA1C 6.1 (H) 06/15/2021 10:19 AM   MICROALBUR 80 03/15/2021 10:11 PM   MICROALBUR 150 05/27/2020 09:39 AM    Last diabetic Eye exam:  Lab Results  Component Value Date/Time   HMDIABEYEEXA No Retinopathy 10/14/2020 12:00 AM    Last diabetic Foot exam: No results found for: "HMDIABFOOTEX"   Lab Results  Component Value Date   CHOL 187 02/08/2022   HDL 87 02/08/2022   LDLCALC 79 02/08/2022   TRIG 126 02/08/2022   CHOLHDL 2.1 02/08/2022       Latest Ref Rng & Units 02/08/2022    9:13 AM 06/15/2021   10:19 AM 03/10/2021    9:06 AM  Hepatic Function  Total Protein 6.0 - 8.5 g/dL 6.9  6.6  6.4   Albumin 3.8 - 4.8 g/dL 4.6  4.4  4.3   AST 0 - 40 IU/L _0 ALT 0 - 32 IU/L 30  24  18   Alk Phosphatase 44 - 121 IU/L 100  100  74   Total Bilirubin 0.0 - 1.2 mg/dL 0.5  0.5  0.5     Lab Results  Component Value Date/Time   TSH 5.700 (H) 02/08/2022 09:13 AM   TSH 4.170 06/15/2021 10:19 AM   FREET4 1.26 02/08/2022 09:13 AM       Latest Ref Rng & Units 02/08/2022    9:13 AM 10/18/2021    1:50 PM 06/15/2021   10:19 AM  CBC  WBC 3.4 - 10.8 x10E3/uL 7.4  6.3  8.7   Hemoglobin 11.1 - 15.9 g/dL 14.4  13.0  15.4   Hematocrit 34.0 - 46.6 % 43.7  40.4  47.2   Platelets 150 - 450 x10E3/uL 344  308  412     Lab Results  Component Value Date/Time   VD25OH 58.5 02/08/2022 09:13 AM   VD25OH 49.8  09/03/2020 09:53 AM    Clinical ASCVD: No  The 10-year ASCVD risk score (Arnett DK, et al., 2019) is: 27.2%   Values used to calculate the score:     Age: 53 years     Sex: Female     Is Non-Hispanic African American: No     Diabetic: Yes     Tobacco smoker: No     Systolic Blood Pressure: 767 mmHg     Is BP treated: Yes     HDL Cholesterol: 87 mg/dL     Total Cholesterol: 187 mg/dL       06/23/2022   10:06 AM 03/08/2022   11:12 AM 02/20/2022    4:10 PM  Depression screen PHQ 2/9  Decreased Interest _0 Down, Depressed, Hopeless _1 PHQ - 2 Score _2 Altered sleeping _3 Tired, decreased energy _4 Change in appetite 0 1 3  Feeling bad or failure about yourself  _5 Trouble concentrating _6 Moving slowly or fidgety/restless _7 Suicidal thoughts 0 0 0  PHQ-9 Score _8 Difficult doing work/chores Extremely dIfficult Extremely dIfficult Very difficult      Social History   Tobacco Use  Smoking Status Never  Smokeless Tobacco Never   BP Readings from Last 3 Encounters:  03/14/22 122/77  03/08/22 130/78  02/20/22 132/78   Pulse Readings from Last 3 Encounters:  03/14/22 (!) 101  03/08/22 76  02/20/22 84   Wt Readings from Last 3 Encounters:  03/14/22 178 lb 2 oz (80.8 kg)  03/08/22 177 lb (80.3 kg)  02/20/22 180 lb 6.4 oz (81.8 kg)    Assessment/Interventions: Review of patient past medical history, allergies, medications, health status, including review of consultants reports, laboratory and other test data, was performed as part of comprehensive evaluation and provision of chronic care management services.   SDOH:  (Social Determinants of Health) assessments and interventions performed: Yes SDOH Interventions    Flowsheet Row Chronic Care Management from 06/23/2022 in [place] from 02/20/2022 in [place] Visit from 02/08/2022 in [place] Management from 07/04/2021  in [facility] Office Visit from 06/15/2021 in [place] Office Visit from 02/14/2021 in [place]  SDOH Interventions        Transportation Interventions Intervention Not Indicated -- -- Intervention Not Indicated -- --  Depression Interventions/Treatment  Currently on Treatment Currently on Treatment Medication -- Currently on  Treatment Medication, Currently on Treatment, Counseling  Financial Strain Interventions Other (Comment)  [PAP] -- -- Other (Comment)  [PAP's completed] -- --        CCM Care Plan  Allergies  Allergen Reactions   Atorvastatin     myalgias     Medications Reviewed Today     Reviewed by Lane Hacker, [facility] (Pharmacist) on 06/23/22 at 1034  Med List Status: <None>   Medication Order Taking? Sig Documenting Provider Last Dose Status Informant  ACIDOPHILUS LACTOBACILLUS PO [id number] No Take 1 capsule by mouth daily. [provider] Taking Active Self  APPLE CIDER VINEGAR PO [id number] No Take 480 mg by mouth daily. [provider] Taking Active Self  B Complex-C-Folic Acid TABS [id number] No Take 1 tablet by mouth daily. [provider] Taking Active Self  Continuous Blood Gluc Receiver (FREESTYLE LIBRE 2 READER) DEVI [id number] No 1 each by Does not apply route as directed. E11.21 Check blood sugar four times daily as directed Cox, Kirsten, MD Taking Active   cyanocobalamin (,VITAMIN B-12,) 1000 MCG/ML injection [id number] No INJECT 1 ML INTO THE MUSCLE EVERY 30 DAYS Cox, Kirsten, MD Taking Active   dapagliflozin propanediol (FARXIGA) 10 MG TABS tablet [id number] No Take 1 tablet (10 mg total) by mouth daily. Cox, Kirsten, MD Taking Active   donepezil (ARICEPT) 5 MG tablet [id number] No Take 1 tablet (5 mg total) by mouth at bedtime. Cox, Kirsten, MD Taking Active   EPINEPHRINE 0.3 mg/0.3 mL IJ SOAJ injection [id number]  INJECT 0.3 MG INTRAMUSCULARLY AS NEEDED FOR anaphylaxis Cox, Kirsten, MD  Active   LORazepam  (ATIVAN) 0.5 MG tablet [id number]  Take 1 tablet (0.5 mg total) by mouth every 8 (eight) hours as needed for anxiety. Rip Harbour, NP  Active   losartan (COZAAR) 25 MG tablet [id number] No Take 1 tablet (25 mg total) by mouth daily. Cox, Kirsten, MD Taking Active   metFORMIN (GLUCOPHAGE) 1000 MG tablet [id number] No Take 1 tablet (1,000 mg total) by mouth 2 (two) times daily with a meal. Cox, Kirsten, MD Taking Active   metoprolol succinate (TOPROL-XL) 25 MG 24 hr tablet [id number] No Take 1 tablet (25 mg total) by mouth in the morning and at bedtime. Taking 2 QD per Cardio per patient Constance Haw, MD Taking Active   Multiple Vitamin (MULTIVITAMIN WITH MINERALS) TABS tablet [id number] No Take 1 tablet by mouth daily. [provider] Taking Active Self  OLANZapine (ZYPREXA) 5 MG tablet [id number] No Take 1 tablet (5 mg total) by mouth at bedtime. Cox, Kirsten, MD Taking Active   omeprazole (PRILOSEC) 20 MG capsule [id number] No Take 1 capsule (20 mg total) by mouth at bedtime. Cox, Kirsten, MD Taking Active   Probiotic Product (PROBIOTIC & ACIDOPHILUS EX ST PO) [id number] No Take 1 tablet by mouth daily.  [provider] Taking Active Self           Med Note Stevan Born   Tue May 27, 2019  3:15 PM)    rivaroxaban (XARELTO) 20 MG TABS tablet [id number] No TAKE ONE TABLET BY MOUTH EVERY MORNING Camnitz, Will Hassell Done, MD Taking Active   rosuvastatin (CRESTOR) 10 MG tablet [id number] No Take 1 tablet (10 mg total) by mouth daily. [name], MD Taking Active   solifenacin (VESICARE) 5 MG tablet [id number] No TAKE 1 TABLET BY MOUTH EVERY DAY Marge Duncans, PA-C Taking Active   SYNTHROID 25 MCG tablet [id number] No Take 1 tablet (25 mcg total) by mouth daily  before breakfast. Cox, Kirsten, MD Taking Active   torsemide (DEMADEX) 20 MG tablet [id number] No Take 1 tablet (20 mg total) by mouth 3 (three) times a week. Please take this on Monday, Wednesday, and Friday. Richardo Priest, MD  Taking Active Self  triamcinolone ointment (KENALOG) 0.1 % [id number]  Apply topically 2 (two) times daily. [provider]  Active   venlafaxine XR (EFFEXOR-XR) 75 MG 24 hr capsule [id number] No Take 2 capsules daily Strength: 75 mg Cox, Kirsten, MD Taking Active   Vitamin D, Ergocalciferol, (DRISDOL) 1.25 MG (50000 UNIT) CAPS capsule [id number] No TAKE 1 CAPSULE BY MOUTH EVERY MONDAY. Cox, Kirsten, MD Taking Active   zinc gluconate 50 MG tablet [id number] No Take 50 mg by mouth daily. [provider] Taking Active Self            Patient Active Problem List   Diagnosis Date Noted   DAT (dementia of Alzheimer type) ([place]) 03/09/2022   Occlusion and stenosis of right posterior cerebral artery 03/09/2022   Somnolence 03/05/2022   Persistent atrial fibrillation ([place]) 08/29/2021   Anxiety about dying 08/29/2021   Sleep-wake schedule disorder, delayed phase type 08/29/2021   Frequent falls 06/25/2021   Imbalance 06/25/2021   Acquired hypothyroidism 06/25/2021   Acquired thrombophilia ([place]) 06/25/2021   Head contusion 06/19/2021   Urinary frequency 04/13/2021   Atypical atrial flutter (HCC)    Atrial fibrillation (HCC)    Anxiety    Arthritis    Ataxic gait    Full dentures    Incontinence    Insomnia    Memory loss    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    Primary insomnia    Wears glasses    Diabetic glomerulopathy ([place]) 09/16/2019   Urge incontinence 09/16/2019   Secondary hypothyroidism 09/16/2019   Longstanding persistent atrial fibrillation ([place]) 09/16/2019   Major depressive disorder, recurrent episode, moderate ([place]) 09/16/2019   Vitamin D insufficiency 09/16/2019   Chronic anticoagulation 05/31/2018   High risk medication use 11/07/2017   B12 deficiency 10/31/2017   Neuropathy in diabetes ([place]) 10/31/2017   Esophageal stricture 06/05/2017   Murmur, cardiac 08/15/2015   Restless legs syndrome 03/14/2013   Hypertensive  heart disease 02/24/2013   Fibromyalgia 02/24/2013   Hereditary and idiopathic peripheral neuropathy 02/24/2013   Mixed hyperlipidemia 02/24/2013   Osteoporosis 02/24/2013   Thoracic or lumbosacral neuritis or radiculitis 02/24/2013   Hyperlipidemia 02/24/2013    Immunization History  Administered Date(s) Administered   Fluad Quad(high Dose 65+) 03/18/2019, 04/25/2020, 04/16/2021   Influenza-Unspecified 03/18/2019, 05/14/2020   Moderna Sars-Covid-2 Vaccination 09/09/2019, 10/04/2019, 04/19/2020, 02/18/2021   Pneumococcal Conjugate-13 05/14/2015   Pneumococcal Polysaccharide-23 05/16/2013, 07/27/2017    Conditions to be addressed/monitored:  Hypertension, Hyperlipidemia, Diabetes, Hypothyroidism, Depression, Anxiety, Osteoporosis and Insomnia.   Care Plan : House  Updates made by Lane Hacker, RPH since 06/23/2022 12:00 AM     Problem: afib, diabetes, hypertension, hyperlipidemia   Priority: High  Onset Date: 08/30/2020     Goal: Disease Management   Start Date: 08/30/2020  Expected End Date: 08/30/2021  Recent Progress: On track  Priority: High  Note:   Current Barriers:  Unable to independently afford treatment regimen Unable to maintain control of diabetes  Pharmacist Clinical Goal(s):  Over the next 90 days, patient will verbalize ability to afford treatment regimen achieve control of diabetes as evidenced by a1c through collaboration with PharmD and provider.   Interventions: 1:1 collaboration with [name], MD  regarding development and update of comprehensive plan of care as evidenced by provider attestation and co-signature Inter-disciplinary care team collaboration (see longitudinal plan of care) Comprehensive medication review performed; medication list updated in electronic medical record  Cardio (BP goal <140/90) BP Readings from Last 3 Encounters:  03/14/22 122/77  03/08/22 130/78  02/20/22 132/78  -controlled -Current  treatment: Losartan 25 mg daily Appropriate, Query effective, Safe, Accessible Metoprolol XL 57m BID Appropriate, Query effective, Safe, Accessible -Medications previously tried: none reported  -Current home readings:   -Jan 25188 1416-606systolic but didn't have numbers  -December 2023:   Didn't have list on her today -Current dietary habits: reports eating during the night some.  -Current exercise habits: none reported  -Denies hypotensive/hypertensive symptoms -Educated on BP goals and benefits of medications for prevention of heart attack, stroke and kidney damage; Daily salt intake goal < 2300 mg; Exercise goal of 150 minutes per week; Importance of home blood pressure monitoring; Proper BP monitoring technique; -Counseled to monitor BP at home daily, document, and provide log at future appointments -Counseled on diet and exercise extensively Jan 2023: Patient states 1301-601systolic normal but doesn't write down. Has appt with Cardio next week. Will write down values and bring into Cardio visit April 2023: Patient would like Metoprolol Succ 531mchanged to 2584mucc BID  Hyperlipidemia: (LDL goal < 55) The 10-year ASCVD risk score (Arnett DK, et al., 2019) is: 27.2%   Values used to calculate the score:     Age: [age]ars     Sex: Female     Is Non-Hispanic African American: No     Diabetic: Yes     Tobacco smoker: No     Systolic Blood Pressure: 122093Hg     Is BP treated: Yes     HDL Cholesterol: 87 mg/dL     Total Cholesterol: 187 mg/dL Lab Results  Component Value Date   CHOL 187 02/08/2022   CHOL 219 (H) 06/15/2021   CHOL 187 03/10/2021   Lab Results  Component Value Date   HDL 87 02/08/2022   HDL 75 06/15/2021   HDL 62 03/10/2021   Lab Results  Component Value Date   LDLCALC 79 02/08/2022   LDLCALC 115 (H) 06/15/2021   LDLCALC 94 03/10/2021   Lab Results  Component Value Date   TRIG 126 02/08/2022   TRIG 168 (H) 06/15/2021   TRIG 181 (H) 03/10/2021    Lab Results  Component Value Date   CHOLHDL 2.1 02/08/2022   CHOLHDL 2.9 06/15/2021   CHOLHDL 3.0 03/10/2021  No results found for: "LDLDIRECT" -not ideally controlled -Current treatment: Rosuvastatin 10 mg daily Appropriate, Effective, Safe, Accessible -Medications previously tried: pravastatin, Lovaza -Current dietary patterns: reports eating at night. Loves eggs.  -Current exercise habits: none reported -Educated on Cholesterol goals;  Benefits of statin for ASCVD risk reduction; Importance of limiting foods high in cholesterol; Exercise goal of 150 minutes per week; -Counseled on diet and exercise extensively Jan 2023: Recommend increasing statin December 2023: Recommended to continue current medication  Diabetes (A1c goal <7%) Lab Results  Component Value Date   HGBA1C 6.4 (H) 02/08/2022   HGBA1C 6.1 (H) 06/15/2021   HGBA1C 6.1 (H) 03/10/2021   Lab Results  Component Value Date   MICROALBUR 80 03/15/2021   LDLCALC 79 02/08/2022   CREATININE 1.00 02/08/2022   Lab Results  Component Value Date   NA 145 (H) 02/08/2022   K 4.4 02/08/2022   CREATININE 1.00 02/08/2022  EGFR 60 02/08/2022   GFRNONAA 53 (L) 09/03/2020   GLUCOSE 125 (H) 02/08/2022   Lab Results  Component Value Date   WBC 7.4 02/08/2022   HGB 14.4 02/08/2022   HCT 43.7 02/08/2022   MCV 94 02/08/2022   PLT 344 02/08/2022  -Controlled -Current medications: metformin 1000 mg daily Appropriate, Effective, Safe, Accessible Farxiga 10 mg daily (PAP) Appropriate, Effective, Safe, Accessible Trulicity 6.06 mg weekly (PAP) Appropriate, Effective, Safe, Accessible -Medications previously tried: none reported  -Current home glucose readings fasting glucose: Jan 2023: Doesn't write them down but states no lows. Recommended to start writing down post prandial glucose: not reported -Denies hypoglycemic/hyperglycemic symptoms -Current meal patterns:  Patient reports eating during the night.  Not eating  well since cardioversion.  -Current exercise: none reported -Educated onA1c and blood sugar goals; Complications of diabetes including kidney damage, retinal damage, and cardiovascular disease; Benefits of weight loss; Benefits of routine self-monitoring of blood sugar; -Counseled to check feet daily and get yearly eye exams -Counseled on diet and exercise extensively April 2023: Will ask PCP to change Metformin BID to QD which is how patient is taking medication December 2023: Patient has been getting meds from [facility] when PAP was approved. Will have CCM team lok into why and fix ASAP   Atrial Fibrillation (Goal: prevent stroke and major bleeding) -controlled  -CHADSVASC: 6 -Current treatment: Rate control:  metoprolol xl 51m BID Appropriate, Effective, Safe, Accessible Losartan 257mQD Appropriate, Effective, Safe, Accessible Anticoagulation:  Xarelto 20 mg daily Appropriate, Effective, Safe, Accessible -Medications previously tried: Eliquis  -Home BP and HR readings: none reported  -Counseled on increased risk of stroke due to Afib and benefits of anticoagulation for stroke prevention; importance of adherence to anticoagulant exactly as prescribed; avoidance of NSAIDs due to increased bleeding risk with anticoagulants; -Counseled on diet and exercise extensively April 2023: Patient would like Metoprolol Succ 5014mhanged to 110m40mcc BID  Osteoporosis / Osteopenia (Goal  Minimize risk of break or fracture) -controlled -Last DEXA Scan:  Dual Femur Neck Right:  04/01/21: -2.0 08/05/18: -1.56 03/31/16:-1.6 03/25/09:-1.4 12/03/01: -0.8 Dual Femur Total Mean:  04/01/21: -1.5 08/05/18: -1.1 03/31/16: -0.7  03/25/09: -0.7 12/03/01: -0.0  -Current treatment  calcium/vitamin D/vitamin k chew daily Appropriate, Effective, Safe, Accessible Vitamin D 50,000 units weekly on Monday Appropriate, Effective, Safe, Accessible -Medications previously tried: none reported  -Recommend [phone]  units of vitamin D daily. Recommend 1200 mg of calcium daily from dietary and supplemental sources. Recommend weight-bearing and muscle strengthening exercises for building and maintaining bone density. -Counseled on diet and exercise extensively Jan 2023: Patient unsure when last Dexa was, will ask PCP April 2023: DEXA scheduled for 12/14/21 December 2023: No need for Dexa until next year  Depression (Goal: manage symptoms of depression) -not ideally controlled    06/23/2022   10:06 AM 03/08/2022   11:12 AM 02/20/2022    4:10 PM  Depression screen PHQ 2/9  Decreased Interest _0 Down, Depressed, Hopeless _1 PHQ - 2 Score _2 Altered sleeping _3 Tired, decreased energy _4 Change in appetite 0 1 3  Feeling bad or failure about yourself  _5 Trouble concentrating _6 Moving slowly or fidgety/restless _7 Suicidal thoughts 0 0 0  PHQ-9 Score _8 Difficult doing work/chores Extremely dIfficult Extremely dIfficult Very difficult  -Current treatment  Venlafaxine XR 75mg24mropriate, Query  effective, Safe, Accessible Olanzapine 77m HS Appropriate, Query effective, Query Safe, Accessible Solifenacin 527mHS Appropriate, Query effective, Query Safe, Accessible -Medications previously tried: Trintellix, Rexulti, states she has tried other medications in the past but has been on venlafaxine of and on for 30 years, venlafaxine Jan 2023: Patient states her grandson and Fiance just moved in with her and she is happy to help them, but stated it is a stressful situation. Not open to changing things now with all the change that's going on in her life April 2023: Patient's brother passed last week, unable to discuss this too much today. [facility]: Within 6 months, her brother and husband passed. Recommend she call hospice (From husband) and talk with grief counselor. Recommend she come in to see PCP ASAP    Patient Goals/Self-Care Activities Over  the next 90 days, patient will:  - take medications as prescribed focus on medication adherence by using pill box check glucose daily , document, and provide at future appointments check blood pressure weekly, document, and provide at future appointments  Follow Up Plan: Telephone follow up appointment with care management team member scheduled for: August 2024  [place] ConstablePhSherian Rein. - [phone]        Medication Assistance: Application for Trulicity and Farxiga  medication assistance program. in process.  Anticipated assistance start date 12/29/2020.  See plan of care for additional detail. Patient currently approved for Trintellix through 07/16/2021. Application for Rexulti in process.   Patient's preferred pharmacy [place]:  [facility] [place] [address] [place]one: [phone]: [id number] [id number]: [phone]: [phone] pill box? Yes Pt endorses good compliance  We discussed: Benefits of medication synchronization, packaging and delivery as well as enhanced pharmacist oversight with [facility]. Patient decided to: Utilize [facility] pharmacy for medication synchronization, packaging and delivery -Verbal consent obtained for [facility] enhanced pharmacy services (medication synchronization, adherence packaging, delivery coordination). A medication sync plan was created to allow patient to get all medications delivered once every 30 to 90 days per patient preference. Patient understands they have freedom to choose pharmacy and clinical pharmacist will coordinate care between all prescribers and [facility].   Care Plan and Follow Up Patient Decision:  Patient agrees to Care Plan and Follow-up.  Plan: Telephone follow up appointment with care management team  member scheduled for:  August 2024  [place] - 3- [phone]

## 2022-06-25 ENCOUNTER — Other Ambulatory Visit: Payer: Self-pay | Admitting: Physician Assistant

## 2022-06-25 DIAGNOSIS — N3941 Urge incontinence: Secondary | ICD-10-CM

## 2022-06-26 ENCOUNTER — Telehealth: Payer: Self-pay

## 2022-06-26 NOTE — Progress Notes (Addendum)
Called pt after getting a message she is having issues paying for some of her medications. I had to leave a vm to call me back to verify.  07/04/22- Spoke with pt today and she stated her Xarelto is a little expensive for her but I advised we did do a PAP for her back in July 2023 and she stated she never got the application. She also stated her Wilder Glade '10mg'$  is expensive. I will work with Felicity Coyer on getting those applications to the pt and start the process again for her. Pt understood    Elray Mcgregor, [place] Pharmacist Assistant  [phone]

## 2022-06-26 NOTE — Progress Notes (Signed)
Chronic Care Management Pharmacy Assistant   Name: April Munoz  MRN: [id number] DOB: 03-17-1951   Reason for Encounter: Medication Coordination for [facility]    Recent office visits:  None  Recent consult visits:  None  Hospital visits:  None  Medications: Outpatient Encounter Medications as of 06/26/2022  Medication Sig   ACIDOPHILUS LACTOBACILLUS PO Take 1 capsule by mouth daily.   APPLE CIDER VINEGAR PO Take 480 mg by mouth daily.   B Complex-C-Folic Acid TABS Take 1 tablet by mouth daily.   Continuous Blood Gluc Receiver (FREESTYLE LIBRE 2 READER) DEVI 1 each by Does not apply route as directed. E11.21 Check blood sugar four times daily as directed   cyanocobalamin (,VITAMIN B-12,) 1000 MCG/ML injection INJECT 1 ML INTO THE MUSCLE EVERY 30 DAYS   dapagliflozin propanediol (FARXIGA) 10 MG TABS tablet Take 1 tablet (10 mg total) by mouth daily.   donepezil (ARICEPT) 5 MG tablet Take 1 tablet (5 mg total) by mouth at bedtime.   EPINEPHRINE 0.3 mg/0.3 mL IJ SOAJ injection INJECT 0.3 MG INTRAMUSCULARLY AS NEEDED FOR anaphylaxis   LORazepam (ATIVAN) 0.5 MG tablet Take 1 tablet (0.5 mg total) by mouth every 8 (eight) hours as needed for anxiety.   losartan (COZAAR) 25 MG tablet Take 1 tablet (25 mg total) by mouth daily.   metFORMIN (GLUCOPHAGE) 1000 MG tablet Take 1 tablet (1,000 mg total) by mouth 2 (two) times daily with a meal.   metoprolol succinate (TOPROL-XL) 25 MG 24 hr tablet Take 1 tablet (25 mg total) by mouth in the morning and at bedtime. Taking 2 QD per Cardio per patient   Multiple Vitamin (MULTIVITAMIN WITH MINERALS) TABS tablet Take 1 tablet by mouth daily.   OLANZapine (ZYPREXA) 5 MG tablet Take 1 tablet (5 mg total) by mouth at bedtime.   omeprazole (PRILOSEC) 20 MG capsule Take 1 capsule (20 mg total) by mouth at bedtime.   Probiotic Product (PROBIOTIC & ACIDOPHILUS EX ST PO) Take 1 tablet by mouth daily.    rivaroxaban (XARELTO) 20 MG TABS tablet TAKE ONE  TABLET BY MOUTH EVERY MORNING   rosuvastatin (CRESTOR) 10 MG tablet Take 1 tablet (10 mg total) by mouth daily.   solifenacin (VESICARE) 5 MG tablet TAKE 1 TABLET BY MOUTH EVERY DAY   SYNTHROID 25 MCG tablet Take 1 tablet (25 mcg total) by mouth daily before breakfast.   torsemide (DEMADEX) 20 MG tablet Take 1 tablet (20 mg total) by mouth 3 (three) times a week. Please take this on Monday, Wednesday, and Friday.   triamcinolone ointment (KENALOG) 0.1 % Apply topically 2 (two) times daily.   venlafaxine XR (EFFEXOR-XR) 75 MG 24 hr capsule Take 2 capsules daily Strength: 75 mg   Vitamin D, Ergocalciferol, (DRISDOL) 1.25 MG (50000 UNIT) CAPS capsule TAKE 1 CAPSULE BY MOUTH EVERY MONDAY.   zinc gluconate 50 MG tablet Take 50 mg by mouth daily.   No facility-administered encounter medications on file as of 06/26/2022.    Reviewed chart for medication changes ahead of medication coordination call.  No OVs, Consults, or hospital visits since last care coordination call/Pharmacist visit.   No medication changes indicated OR if recent visit, treatment plan here.  BP Readings from Last 3 Encounters:  03/14/22 122/77  03/08/22 130/78  02/20/22 132/78    Lab Results  Component Value Date   HGBA1C 6.4 (H) 02/08/2022     Patient obtains medications through Adherence Packaging  30ds   Last adherence delivery included:  Metformin 1000 mg- 1 at B 1 at BT Metoprolol Succ ER 25 mg- 1 at B 1 at [place] at B  Olanzapine 5 mg- 1 at BT Omeprazole 20 mg- 1 at B Venlafaxine ER 150 mg- 1 at B Vitamin D2 1250 mcg- 1 at B on Mondays Xarelto 20 mg- 1 at B Farxiga 10 mg-  1 at B Trulicity 0.'75mg'$ /0.76m Inject 0.'75mg'$  once a week on Thursday Epinephrine 0.'3mg'$  Injecdt 0.'3mg'$  into the muscle prn  Donepezil 5 mg- 1 at BT Rosuvastatin 10 mg- 1 at B Synthroid 25 mcg- 1 BB Losartan 25 mg- 1 at BT Torsemide 20 mg- 1 at B on MWF  Patient declined (meds) last delivery Trulicity-  D/C Solifenacin- Filled 03-31-2022 90 DS [facility] Ativan- No longer takes  Patient is due for next adherence delivery on: 07/06/22. Called patient and reviewed medications and coordinated delivery.  This delivery to include:These are only for 30 days to sync up with other meds Olanzapine 5 mg- 1 at BT Vitamin D2 1250 mcg- 1 at B on Mondays Xarelto 20 mg- 1 at B Farxiga 10 mg-  1 at B Donepezil 5 mg- 1 at BT Torsemide 20 mg- 1 at B on MWF  Patient declined the following medications  All other maintenance meds aren't due yet Solifenacin- Filled 03-31-2022 90 DS [facility] Ativan- No longer takes  Patient needs refills  None  Confirmed delivery date of 07/06/22, advised patient that pharmacy will contact them the morning of delivery.   DElray Mcgregor CUniversity ParkPharmacist Assistant  [phone]

## 2022-07-04 ENCOUNTER — Other Ambulatory Visit: Payer: Self-pay | Admitting: Cardiology

## 2022-07-04 NOTE — Telephone Encounter (Signed)
Refill sent to pharmacy.   

## 2022-07-14 ENCOUNTER — Other Ambulatory Visit: Payer: Self-pay | Admitting: Family Medicine

## 2022-07-14 DIAGNOSIS — E559 Vitamin D deficiency, unspecified: Secondary | ICD-10-CM

## 2022-07-16 DIAGNOSIS — E785 Hyperlipidemia, unspecified: Secondary | ICD-10-CM

## 2022-07-16 DIAGNOSIS — I4891 Unspecified atrial fibrillation: Secondary | ICD-10-CM | POA: Diagnosis not present

## 2022-07-16 DIAGNOSIS — F32A Depression, unspecified: Secondary | ICD-10-CM | POA: Diagnosis not present

## 2022-07-16 DIAGNOSIS — M810 Age-related osteoporosis without current pathological fracture: Secondary | ICD-10-CM

## 2022-07-16 DIAGNOSIS — E1159 Type 2 diabetes mellitus with other circulatory complications: Secondary | ICD-10-CM

## 2022-07-22 ENCOUNTER — Other Ambulatory Visit: Payer: Self-pay | Admitting: Family Medicine

## 2022-07-22 DIAGNOSIS — G4733 Obstructive sleep apnea (adult) (pediatric): Secondary | ICD-10-CM | POA: Diagnosis not present

## 2022-07-23 NOTE — Telephone Encounter (Signed)
Please call patient to schedule her a fasting appt when my next is available.  Check when due for AWV and schedule as appropriate.  Dr. Tobie Poet

## 2022-07-26 ENCOUNTER — Other Ambulatory Visit: Payer: Self-pay | Admitting: Family Medicine

## 2022-07-26 ENCOUNTER — Telehealth: Payer: Self-pay

## 2022-07-26 NOTE — Progress Notes (Signed)
Care Management & Coordination Services Pharmacy Team  Reason for Encounter: Medication coordination and delivery  Contacted patient on 07/26/2022 to discuss medications   Recent office visits:  None  Recent consult visits:  None  Hospital visits:  None  Medications: Outpatient Encounter Medications as of 07/26/2022  Medication Sig   ACIDOPHILUS LACTOBACILLUS PO Take 1 capsule by mouth daily.   APPLE CIDER VINEGAR PO Take 480 mg by mouth daily.   B Complex-C-Folic Acid TABS Take 1 tablet by mouth daily.   Continuous Blood Gluc Receiver (FREESTYLE LIBRE 2 READER) DEVI 1 each by Does not apply route as directed. E11.21 Check blood sugar four times daily as directed   cyanocobalamin (,VITAMIN B-12,) 1000 MCG/ML injection INJECT 1 ML INTO THE MUSCLE EVERY 30 DAYS   dapagliflozin propanediol (FARXIGA) 10 MG TABS tablet Take 1 tablet (10 mg total) by mouth daily.   donepezil (ARICEPT) 5 MG tablet TAKE ONE TABLET BY MOUTH EVERYDAY AT BEDTIME   EPINEPHRINE 0.3 mg/0.3 mL IJ SOAJ injection INJECT 0.3 MG INTRAMUSCULARLY AS NEEDED FOR anaphylaxis   LORazepam (ATIVAN) 0.5 MG tablet Take 1 tablet (0.5 mg total) by mouth every 8 (eight) hours as needed for anxiety.   losartan (COZAAR) 25 MG tablet TAKE ONE TABLET BY MOUTH ONCE DAILY   metFORMIN (GLUCOPHAGE) 1000 MG tablet Take 1 tablet (1,000 mg total) by mouth 2 (two) times daily with a meal.   metoprolol succinate (TOPROL-XL) 25 MG 24 hr tablet Take 1 tablet (25 mg total) by mouth in the morning and at bedtime. Taking 2 QD per Cardio per patient   Multiple Vitamin (MULTIVITAMIN WITH MINERALS) TABS tablet Take 1 tablet by mouth daily.   OLANZapine (ZYPREXA) 5 MG tablet TAKE ONE TABLET BY MOUTH EVERYDAY AT BEDTIME   omeprazole (PRILOSEC) 20 MG capsule Take 1 capsule (20 mg total) by mouth at bedtime.   Probiotic Product (PROBIOTIC & ACIDOPHILUS EX ST PO) Take 1 tablet by mouth daily.    rivaroxaban (XARELTO) 20 MG TABS tablet TAKE ONE TABLET BY  MOUTH EVERY MORNING   rosuvastatin (CRESTOR) 10 MG tablet Take 1 tablet (10 mg total) by mouth daily.   solifenacin (VESICARE) 5 MG tablet TAKE 1 TABLET BY MOUTH EVERY DAY   SYNTHROID 25 MCG tablet TAKE ONE TABLET BY MOUTH BEFORE BREAKFAST   torsemide (DEMADEX) 20 MG tablet TAKE ONE TABLET BY MOUTH ONCE WEEKLY ON MONDAY and TAKE ONE TABLET BY MOUTH ONCE WEEKLY ON WEDNESDAY and TAKE ONE TABLET BY MOUTH ONCE WEEKLY ON FRIDAY   triamcinolone ointment (KENALOG) 0.1 % Apply topically 2 (two) times daily.   venlafaxine XR (EFFEXOR-XR) 75 MG 24 hr capsule Take 2 capsules daily Strength: 75 mg   Vitamin D, Ergocalciferol, (DRISDOL) 1.25 MG (50000 UNIT) CAPS capsule TAKE ONE CAPSULE BY MOUTH ONCE WEEKLY ON MONDAY   zinc gluconate 50 MG tablet Take 50 mg by mouth daily.   No facility-administered encounter medications on file as of 07/26/2022.   BP Readings from Last 3 Encounters:  03/14/22 122/77  03/08/22 130/78  02/20/22 132/78    Pulse Readings from Last 3 Encounters:  03/14/22 (!) 101  03/08/22 76  02/20/22 84    Lab Results  Component Value Date/Time   HGBA1C 6.4 (H) 02/08/2022 09:13 AM   HGBA1C 6.1 (H) 06/15/2021 10:19 AM   Lab Results  Component Value Date   CREATININE 1.00 02/08/2022   BUN 12 02/08/2022   GFRNONAA 53 (L) 09/03/2020   GFRAA 61 09/03/2020   NA  145 (H) 02/08/2022   K 4.4 02/08/2022   CALCIUM 9.7 02/08/2022   CO2 27 02/08/2022     Last adherence delivery date:07/06/22      Patient is due for next adherence delivery on: 08/07/22  Spoke with patient on 07/26/22 reviewed medications and coordinated delivery.  This delivery to include: Packaging 90 Days  Metformin 1000 mg- 1 at B 1 at BT Metoprolol Succ ER 25 mg- 1 at B 1 at [place] at B  Olanzapine 5 mg- 1 at BT Omeprazole 20 mg- 1 at B Venlafaxine ER 150 mg- 1 at B Xarelto 20 mg- 1 at B Farxiga 10 mg-  1 at B Donepezil 5 mg- 1 at BT Rosuvastatin 10 mg- 1 at B Synthroid 25 mcg- 1  BB Losartan 25 mg- 1 at BT Torsemide 20 mg- 1 at B on MWF  Patient declined the following medications this month: Epi Pen- Only Prn, does not need  Lorazepam 0.'5mg'$ - Not taking  Vesicare '5mg'$ - [facility]- Last fill 06/05/22 90ds  No refill request needed.  Confirmed delivery date of 08/07/22, advised patient that pharmacy will contact them the morning of delivery.  Any concerns about your medications? No, pt was advised Farxiga and Xarelto were still waiting on PAP.   How often do you forget or accidentally miss a dose? Never  Do you use a pillbox? No  Is patient in packaging Yes  Recent blood pressure readings are as follows:03/14/22 122/77  Recent blood glucose readings are as follows:None   Cycle dispensing form sent to Pattricia Boss for review.   Ovidio Hanger [employer]

## 2022-08-03 ENCOUNTER — Telehealth: Payer: Self-pay

## 2022-08-03 ENCOUNTER — Other Ambulatory Visit: Payer: Self-pay | Admitting: Family Medicine

## 2022-08-03 ENCOUNTER — Other Ambulatory Visit: Payer: Self-pay

## 2022-08-03 DIAGNOSIS — E559 Vitamin D deficiency, unspecified: Secondary | ICD-10-CM

## 2022-08-03 MED ORDER — VITAMIN D (ERGOCALCIFEROL) 1.25 MG (50000 UNIT) PO CAPS: ORAL_CAPSULE | ORAL | 0 refills | Status: DC

## 2022-08-03 NOTE — Progress Notes (Cosign Needed)
08-03-2022: Received message form chasity at [facility] that patient needs a 90 DS Vitamin D prescription sent in and patient called [facility] the other day and said "she only need metformin and metoprolol in the morning packs now, not in the bedtime packs anymore". Followed up with patient and was informed that the 2 medication times didn't change and she called because she returned some medications and is expecting a refund. Informed chasity to reach out to patient for this matter.  [place] Pharmacist Assistant [phone]

## 2022-08-22 DIAGNOSIS — G4733 Obstructive sleep apnea (adult) (pediatric): Secondary | ICD-10-CM | POA: Diagnosis not present

## 2022-08-24 ENCOUNTER — Other Ambulatory Visit: Payer: Self-pay | Admitting: Family Medicine

## 2022-08-24 ENCOUNTER — Encounter (HOSPITAL_COMMUNITY): Payer: Self-pay | Admitting: *Deleted

## 2022-08-24 DIAGNOSIS — E538 Deficiency of other specified B group vitamins: Secondary | ICD-10-CM

## 2022-08-28 ENCOUNTER — Other Ambulatory Visit: Payer: Self-pay | Admitting: Family Medicine

## 2022-09-06 DIAGNOSIS — M1712 Unilateral primary osteoarthritis, left knee: Secondary | ICD-10-CM | POA: Diagnosis not present

## 2022-09-26 ENCOUNTER — Ambulatory Visit (INDEPENDENT_AMBULATORY_CARE_PROVIDER_SITE_OTHER): Payer: PPO | Admitting: Family Medicine

## 2022-09-26 VITALS — BP 124/72 | HR 78 | Temp 98.2°F | Ht 67.0 in | Wt 163.0 lb

## 2022-09-26 DIAGNOSIS — G308 Other Alzheimer's disease: Secondary | ICD-10-CM

## 2022-09-26 DIAGNOSIS — F01C3 Vascular dementia, severe, with mood disturbance: Secondary | ICD-10-CM

## 2022-09-26 DIAGNOSIS — I509 Heart failure, unspecified: Secondary | ICD-10-CM | POA: Diagnosis not present

## 2022-09-26 DIAGNOSIS — E039 Hypothyroidism, unspecified: Secondary | ICD-10-CM

## 2022-09-26 DIAGNOSIS — E538 Deficiency of other specified B group vitamins: Secondary | ICD-10-CM

## 2022-09-26 DIAGNOSIS — I110 Hypertensive heart disease with heart failure: Secondary | ICD-10-CM | POA: Diagnosis not present

## 2022-09-26 DIAGNOSIS — F419 Anxiety disorder, unspecified: Secondary | ICD-10-CM

## 2022-09-26 DIAGNOSIS — E559 Vitamin D deficiency, unspecified: Secondary | ICD-10-CM

## 2022-09-26 DIAGNOSIS — E782 Mixed hyperlipidemia: Secondary | ICD-10-CM

## 2022-09-26 DIAGNOSIS — F02B3 Dementia in other diseases classified elsewhere, moderate, with mood disturbance: Secondary | ICD-10-CM | POA: Diagnosis not present

## 2022-09-26 DIAGNOSIS — D6869 Other thrombophilia: Secondary | ICD-10-CM | POA: Diagnosis not present

## 2022-09-26 DIAGNOSIS — F332 Major depressive disorder, recurrent severe without psychotic features: Secondary | ICD-10-CM | POA: Diagnosis not present

## 2022-09-26 DIAGNOSIS — E1142 Type 2 diabetes mellitus with diabetic polyneuropathy: Secondary | ICD-10-CM | POA: Diagnosis not present

## 2022-09-26 DIAGNOSIS — I4892 Unspecified atrial flutter: Secondary | ICD-10-CM

## 2022-09-26 MED ORDER — "SYRINGE 27G X 1-1/4"" 3 ML MISC": 1.0000 | 0 refills | Status: DC

## 2022-09-26 MED ORDER — VENLAFAXINE HCL ER 75 MG PO CP24: ORAL_CAPSULE | ORAL | 1 refills | Status: DC

## 2022-09-26 MED ORDER — DONEPEZIL HCL 10 MG PO TABS: ORAL_TABLET | ORAL | 1 refills | Status: DC

## 2022-09-26 NOTE — Patient Instructions (Addendum)
Increase effexor xr 75 mg 3 in am. Continue zyprexa 5 mg one at night.   Increase donepezil to 10 mg before bed.   Hold omeprazole.

## 2022-09-26 NOTE — Progress Notes (Signed)
Subjective:  Patient ID: April Munoz, female    DOB: 10/28/1950  Age: 72 y.o. MRN: [id number]  Chief Complaint  Patient presents with   Depression    HPI:   Patient presents today for depression. Grieving the loss of her husband, April Munoz, who passed away last 03-28-2023. States she wants to lay in the bed all day but at night feels restless. Currently on effexor xr 75 mg two capsules daily and olanzeprine 5 mg qhs.   DMII: Currently on farxiga 10 mg daily and metformin 1000 mg twice daily, utd with eye exam, checks feet daily. Check sugars about 4 times per week. 120-140s.  HTN: Metoprolol 25 mg twice daily   Thyroid: Synthroid 25 mcg daily  Lipid: Was on lipitor but has run out  Atrial flutter: on xarelto 20 mg daily and metoprolol 25 mg twice daily.  On torsemide 20 mg three times per week.   Urge incontinence: on vesicare 5 mg daily.   GERD: omeprazole 20 mg daily. Has not tried to come off of it.   B12 deficiency: on b12 shots monthly.   Alzheimer's dementia: sees neurology. Currently on aricept 10 mg before bed. Short term memory loss has worsened.      09/26/2022    9:24 AM 06/23/2022   10:06 AM 03/08/2022   11:12 AM 02/20/2022    4:10 PM 02/08/2022    8:44 AM  Depression screen PHQ 2/9  Decreased Interest 3 2 2 3 3   Down, Depressed, Hopeless 3 3 2 3 3   PHQ - 2 Score 6 5 4 6 6   Altered sleeping 3 3 3 3 3   Tired, decreased energy 3 3 1 3 3   Change in appetite 3 0 1 3 3   Feeling bad or failure about yourself  1 1 1 1 1   Trouble concentrating 3 1 2 3 3   Moving slowly or fidgety/restless 3 1 1 3 3   Suicidal thoughts 0 0 0 0 0  PHQ-9 Score 22 14 13 22 22   Difficult doing work/chores Very difficult Extremely dIfficult Extremely dIfficult Very difficult Very difficult         10/01/2020   11:19 AM 11/26/2020    9:25 AM 12/14/2020    4:05 PM 10/27/2021    9:10 AM 02/20/2022    4:10 PM  Fall Risk  Falls in the past year? 1  1  1   Was there an injury with Fall? 1  0  1   Fall Risk Category Calculator 3  2  3   Fall Risk Category (Retired) High  Moderate  High  (RETIRED) Patient Fall Risk Level  High fall risk Moderate fall risk High fall risk High fall risk  Patient at Risk for Falls Due to Impaired balance/gait  Other (Comment);History of fall(s)  History of fall(s);Impaired balance/gait  Patient at Risk for Falls Due to - Comments   numbness in feet    Fall risk Follow up   Falls evaluation completed;Education provided;Falls prevention discussed  Falls evaluation completed;Education provided;Falls prevention discussed      Review of Systems  Constitutional:  Positive for fatigue. Negative for chills and fever.  Respiratory:  Negative for cough and shortness of breath.   Cardiovascular:  Negative for chest pain.  Gastrointestinal:  Negative for diarrhea and nausea.  Neurological:  Negative for dizziness and headaches.  Psychiatric/Behavioral:  Positive for decreased concentration and sleep disturbance.     Current Outpatient Medications on File Prior to Visit  Medication Sig Dispense Refill  ACIDOPHILUS LACTOBACILLUS PO Take 1 capsule by mouth daily.     APPLE CIDER VINEGAR PO Take 480 mg by mouth daily.     B Complex-C-Folic Acid TABS Take 1 tablet by mouth daily.     Continuous Blood Gluc Receiver (FREESTYLE LIBRE 2 READER) DEVI 1 each by Does not apply route as directed. E11.21 Check blood sugar four times daily as directed 1 each 0   cyanocobalamin (VITAMIN B12) 1000 MCG/ML injection INJECT ONE ML INTRAMUSCULARLY EVERY 30 DAYS 3 mL 2   dapagliflozin propanediol (FARXIGA) 10 MG TABS tablet Take 1 tablet (10 mg total) by mouth daily. 90 tablet 1   EPINEPHRINE 0.3 mg/0.3 mL IJ SOAJ injection INJECT 0.3 MG INTRAMUSCULARLY AS NEEDED FOR anaphylaxis 2 each 1   metFORMIN (GLUCOPHAGE) 1000 MG tablet Take 1 tablet (1,000 mg total) by mouth 2 (two) times daily with a meal. 180 tablet 1   metoprolol succinate (TOPROL-XL) 25 MG 24 hr tablet Take 1 tablet (25  mg total) by mouth in the morning and at bedtime. Taking 2 QD per Cardio per patient 180 tablet 1   Multiple Vitamin (MULTIVITAMIN WITH MINERALS) TABS tablet Take 1 tablet by mouth daily.     OLANZapine (ZYPREXA) 5 MG tablet TAKE ONE TABLET BY MOUTH EVERYDAY AT BEDTIME 90 tablet 0   Probiotic Product (PROBIOTIC & ACIDOPHILUS EX ST PO) Take 1 tablet by mouth daily.      rivaroxaban (XARELTO) 20 MG TABS tablet TAKE ONE TABLET BY MOUTH EVERY MORNING 90 tablet 1   solifenacin (VESICARE) 5 MG tablet TAKE 1 TABLET BY MOUTH EVERY DAY 90 tablet 0   SYNTHROID 25 MCG tablet TAKE ONE TABLET BY MOUTH BEFORE BREAKFAST 90 tablet 0   torsemide (DEMADEX) 20 MG tablet TAKE ONE TABLET BY MOUTH ONCE WEEKLY ON MONDAY and TAKE ONE TABLET BY MOUTH ONCE WEEKLY ON WEDNESDAY and TAKE ONE TABLET BY MOUTH ONCE WEEKLY ON FRIDAY 90 tablet 0   triamcinolone ointment (KENALOG) 0.1 % Apply topically 2 (two) times daily.     Vitamin D, Ergocalciferol, (DRISDOL) 1.25 MG (50000 UNIT) CAPS capsule TAKE ONE CAPSULE BY MOUTH ONCE WEEKLY ON Monday 12 capsule 0   zinc gluconate 50 MG tablet Take 50 mg by mouth daily.     No current facility-administered medications on file prior to visit.   Past Medical History:  Diagnosis Date   Anxiety    Arthritis    Ataxic gait    Atrial fibrillation (HCC)    Atrophy of thyroid    B12 deficiency 10/31/2017   Cellulitis of abdominal wall 07/07/2016   Chronic anticoagulation 05/31/2018   Depression    Depression, major, recurrent, mild ([place]) 09/16/2019   Diabetes mellitus without complication ([place])    Diabetic glomerulopathy ([place]) 09/16/2019   Esophageal stricture 06/05/2017   Essential hypertension 02/24/2013   Fatigue 01/20/2020   Fibromyalgia 02/24/2013   Full dentures    Hereditary and idiopathic peripheral neuropathy 02/24/2013   High risk medication use 11/07/2017   Hyperlipidemia 02/24/2013   Hypertension    Hypertensive heart disease 02/24/2013   Incontinence    Insomnia     Longstanding persistent atrial fibrillation ([place]) 09/16/2019   Mild vitamin D deficiency 09/16/2019   Mixed hyperlipidemia 02/24/2013   Murmur, cardiac 08/15/2015   Neuropathy in diabetes ([place]) 10/31/2017   Osteoporosis    Other amnesia    Other amnesia    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    Paroxysmal atrial fibrillation ([place]) 08/15/2015  CHADS2vasc=3 CHADS2vasc=3   Primary insomnia    Restless legs syndrome 03/14/2013   Secondary hypothyroidism 09/16/2019   Thoracic or lumbosacral neuritis or radiculitis 02/24/2013   Type 2 diabetes mellitus, without long-term current use of insulin ([place]) 02/24/2013   Urge incontinence of urine 09/16/2019   Wears glasses    Past Surgical History:  Procedure Laterality Date   A-FLUTTER ABLATION N/A 07/30/2019   Procedure: A-FLUTTER ABLATION;  Surgeon: Constance Haw, MD;  Location: [facility];  Service: Cardiovascular;  Laterality: N/A;   ATRIAL FIBRILLATION ABLATION N/A 03/21/2019   Procedure: ATRIAL FIBRILLATION ABLATION;  Surgeon: Constance Haw, MD;  Location: [facility];  Service: Cardiovascular;  Laterality: N/A;   ATRIAL FIBRILLATION ABLATION N/A 10/27/2021   Procedure: ATRIAL FIBRILLATION ABLATION;  Surgeon: Constance Haw, MD;  Location: [place] [facility];  Service: Cardiovascular;  Laterality: N/A;   [place] STUDY  11/26/2020   Procedure: BUBBLE STUDY;  Surgeon: Pixie Casino, MD;  Location: [facility] ENDOSCOPY;  Service: Cardiovascular;;   CARDIOVERSION N/A 11/26/2020   Procedure: CARDIOVERSION;  Surgeon: Pixie Casino, MD;  Location: [facility] ENDOSCOPY;  Service: Cardiovascular;  Laterality: N/A;   CATARACT EXTRACTION, BILATERAL  2018, 2019   CHOLECYSTECTOMY  1971   open   DIAGNOSTIC LAPAROSCOPY  2010   lysis of adhesions   DILATION AND CURETTAGE OF UTERUS  2004   KNEE ARTHROSCOPY  1998   left   KNEE ARTHROSCOPY W/ LATERAL RETINACULAR REPAIR     MASS EXCISION  Left 11/11/2013   Procedure: EXCISION MUCOID CYST LEFT INDEX FINGER/DEBRIDEMENT LEFT INDEX FINGER;  Surgeon: Wynonia Sours, MD;  Location: [facility];  Service: Orthopedics;  Laterality: Left;  ANESTHESIA: IV REGIONAL/FAB   SHOULDER ARTHROSCOPY W/ ROTATOR CUFF REPAIR  2007   left   TEE WITHOUT CARDIOVERSION N/A 11/26/2020   Procedure: TRANSESOPHAGEAL ECHOCARDIOGRAM (TEE);  Surgeon: Pixie Casino, MD;  Location: [facility] ENDOSCOPY;  Service: Cardiovascular;  Laterality: N/A;   TRIGGER FINGER RELEASE Left 11/11/2013   Procedure: RELEASE A-1 PULLEY LEFT RING FINGER;  Surgeon: Wynonia Sours, MD;  Location: [facility];  Service: Orthopedics;  Laterality: Left;   UMBILICAL HERNIA REPAIR  2008, 2010    Family History  Problem Relation Age of Onset   Atrial fibrillation Mother    Heart attack Mother    Heart failure Father    Heart disease Brother    Heart attack Brother    Bladder Cancer Brother    Diabetes Maternal Grandmother    Heart failure Paternal Grandfather    Stroke Paternal Grandfather    Social History   Socioeconomic History   Marital status: Married    Spouse name: Liliane Channel   Number of children: 2   Years of education: Not on file   Highest education level: Some college, no degree  Occupational History   Occupation: retired  Tobacco Use   Smoking status: Never   Smokeless tobacco: Never  Vaping Use   Vaping Use: Never used  Substance and Sexual Activity   Alcohol use: Yes    Alcohol/week: 3.0 - 4.0 standard drinks of alcohol    Types: 1 - 2 Glasses of wine, 2 Shots of liquor per week    Comment: 4 times a week   Drug use: No   Sexual activity: Not on file  Other Topics Concern   Not on file  Social History Narrative   Lives at  home with husband - one daughter that lives in [place] (adopted) and one son (adopted)   Left handed   Caffeine: caffeine free drinks. 2 cup of coffee a day   Social Determinants of Health   Financial Resource Strain: High  Risk (06/23/2022)   Overall Financial Resource Strain (CARDIA)    Difficulty of Paying Living Expenses: Hard  Food Insecurity: No Food Insecurity (12/02/2020)   Hunger Vital Sign    Worried About Running Out of Food in the Last Year: Never true    Ran Out of Food in the Last Year: Never true  Transportation Needs: No Transportation Needs (06/23/2022)   PRAPARE - Hydrologist (Medical): No    Lack of Transportation (Non-Medical): No  Physical Activity: Not on file  Stress: Not on file  Social Connections: Not on file    Objective:  BP 124/72   Pulse 78   Temp 98.2 F (36.8 C)   Ht 5\' 7"  (1.702 m)   Wt 163 lb (73.9 kg)   SpO2 98%   BMI 25.53 kg/m      09/26/2022    9:23 AM 03/14/2022    2:24 PM 03/08/2022   11:31 AM  BP/Weight  Systolic BP [id number]  Diastolic BP 72 77 78  Wt. (Lbs) 163 178.13 177  BMI 25.53 kg/m2 27.08 kg/m2 27.72 kg/m2    Physical Exam Vitals reviewed.  Constitutional:      Appearance: Normal appearance.  Neck:     Vascular: No carotid bruit.  Cardiovascular:     Rate and Rhythm: Normal rate and regular rhythm.     Pulses: Normal pulses.     Heart sounds: Normal heart sounds.  Pulmonary:     Effort: Pulmonary effort is normal. No respiratory distress.     Breath sounds: Normal breath sounds.  Abdominal:     General: Abdomen is flat. Bowel sounds are normal.     Palpations: Abdomen is soft.     Tenderness: There is no abdominal tenderness.  Neurological:     Mental Status: She is alert and oriented to person, place, and time.  Psychiatric:        Behavior: Behavior normal.     Comments: Depressed. Flat affect.     Diabetic Foot Exam - Simple   Simple Foot Form Diabetic Foot exam was performed with the following findings: Yes 09/26/2022 10:02 AM  Visual Inspection See comments: Yes Sensation Testing See comments: Yes Pulse Check Posterior Tibialis and Dorsalis pulse intact bilaterally:  Yes Comments Decreased sensation of BL feet . Calluses.       Lab Results  Component Value Date   WBC 7.4 09/26/2022   HGB 13.8 09/26/2022   HCT 41.5 09/26/2022   PLT 293 09/26/2022   GLUCOSE 111 (H) 09/26/2022   CHOL 149 09/26/2022   TRIG 94 09/26/2022   HDL 91 09/26/2022   LDLCALC 41 09/26/2022   ALT 26 09/26/2022   AST 15 09/26/2022   NA 142 09/26/2022   K 5.3 (H) 09/26/2022   CL 102 09/26/2022   CREATININE 1.12 (H) 09/26/2022   BUN 24 09/26/2022   CO2 27 09/26/2022   TSH 2.730 09/26/2022   HGBA1C 6.9 (H) 09/26/2022   MICROALBUR 80 03/15/2021      Assessment & Plan:    Hypertensive heart disease with congestive heart failure, unspecified heart failure type [facility]) Assessment & Plan: Not quite at goal. Continue Toprol xl 25 mg twice daily.  Continue  to work on eating a healthy diet and exercise.  Labs drawn today.    Orders: -     CBC with Differential/Platelet -     Comprehensive metabolic panel  Mixed hyperlipidemia Assessment & Plan: Await labs/testing for assessment and recommendations. Continue to work on eating a healthy diet and exercise.  Labs drawn today.    Orders: -     Lipid panel  Moderate Alzheimer's dementia of other onset with mood disturbance (HCC) Assessment & Plan: Continue aricept 10 mg before bed  Keep appt with Dr. Leta Baptist.  Orders: -     Donepezil HCl; TAKE ONE TABLET BY MOUTH EVERYDAY AT BEDTIME  Dispense: 90 tablet; Refill: 1  Vitamin D insufficiency Assessment & Plan: The current medical regimen is effective;  continue present plan and medications. Continue vitamin d 50 K once weekly.  Orders: -     VITAMIN D 25 Hydroxy (Vit-D Deficiency, Fractures)  Acquired thrombophilia ([place]) Assessment & Plan: Secondary to xarelto for atrial fibrillation.    Diabetic polyneuropathy associated with type 2 diabetes mellitus ([place]) Assessment & Plan: Control: at goal Recommend check sugars fasting daily. Recommend check feet  daily. Recommend annual eye exams. Medicines: Continue farxiga 10 mg daily and metformin 1000 mg twice daily. Continue to work on eating a healthy diet and exercise.  Labs drawn today.     Orders: -     Hemoglobin A1c  B12 deficiency Assessment & Plan: Continue b12 monthly   Orders: -     Vitamin B12 -     Methylmalonic acid, serum  Acquired hypothyroidism Assessment & Plan: Previously well controlled Continue Synthroid at current dose  Recheck TSH and adjust Synthroid as indicated     Orders: -     T4, free -     TSH  Severe episode of recurrent major depressive disorder, without psychotic features ([place]) Assessment & Plan: Not at goal. Increase effexor xr 75 mg 3 in am. Continue zyprexa 5 mg one at night.     Orders: -     Venlafaxine HCl ER; Take 3 capsules daily in am  Dispense: 270 capsule; Refill: 1  Other orders -     Syringe; 1 each by Does not apply route every 30 (thirty) days.  Dispense: 100 each; Refill: 0 -     Cardiovascular Risk Assessment     Meds ordered this encounter  Medications   venlafaxine XR (EFFEXOR-XR) 75 MG 24 hr capsule    Sig: Take 3 capsules daily in am    Dispense:  270 capsule    Refill:  1   donepezil (ARICEPT) 10 MG tablet    Sig: TAKE ONE TABLET BY MOUTH EVERYDAY AT BEDTIME    Dispense:  90 tablet    Refill:  1    This prescription was filled on 05/03/2022. Any refills authorized will be placed on file.   Syringe/Needle, Disp, (SYRINGE 3CC/27GX1-1/4") 27G X 1-1/4" 3 ML MISC    Sig: 1 each by Does not apply route every 30 (thirty) days.    Dispense:  100 each    Refill:  0    Orders Placed This Encounter  Procedures   CBC with Differential/Platelet   Comprehensive metabolic panel   Hemoglobin A1c   Lipid panel   Vitamin B12   Methylmalonic acid, serum   VITAMIN D 25 Hydroxy (Vit-D Deficiency, Fractures)   T4, free   TSH   Cardiovascular Risk Assessment     Follow-up: Return in about 3 months (  around  12/27/2022) for chronic fasting.   I,Katherina A Bramblett,acting as a scribe for [name], MD.,have documented all relevant documentation on the behalf of [name], MD,as directed by  [name], MD while in the presence of [name], MD.   An After Visit Summary was printed and given to the patient.  [name], MD [facility] [phone]

## 2022-09-29 LAB — COMPREHENSIVE METABOLIC PANEL
ALT: 26 IU/L (ref 0–32)
AST: 15 IU/L (ref 0–40)
Albumin/Globulin Ratio: 2.3 — ABNORMAL HIGH (ref 1.2–2.2)
Albumin: 4.3 g/dL (ref 3.8–4.8)
Alkaline Phosphatase: 56 IU/L (ref 44–121)
BUN/Creatinine Ratio: 21 (ref 12–28)
BUN: 24 mg/dL (ref 8–27)
Bilirubin Total: 0.4 mg/dL (ref 0.0–1.2)
CO2: 27 mmol/L (ref 20–29)
Calcium: 9.8 mg/dL (ref 8.7–10.3)
Chloride: 102 mmol/L (ref 96–106)
Creatinine, Ser: 1.12 mg/dL — ABNORMAL HIGH (ref 0.57–1.00)
Globulin, Total: 1.9 g/dL (ref 1.5–4.5)
Glucose: 111 mg/dL — ABNORMAL HIGH (ref 70–99)
Potassium: 5.3 mmol/L — ABNORMAL HIGH (ref 3.5–5.2)
Sodium: 142 mmol/L (ref 134–144)
Total Protein: 6.2 g/dL (ref 6.0–8.5)
eGFR: 52 mL/min/{1.73_m2} — ABNORMAL LOW (ref >59)

## 2022-09-29 LAB — HEMOGLOBIN A1C
Est. average glucose Bld gHb Est-mCnc: 151 mg/dL
Hgb A1c MFr Bld: 6.9 % — ABNORMAL HIGH (ref 4.8–5.6)

## 2022-09-29 LAB — CBC WITH DIFFERENTIAL/PLATELET
Basophils Absolute: 0.1 10*3/uL (ref 0.0–0.2)
Basos: 1 %
EOS (ABSOLUTE): 0.1 10*3/uL (ref 0.0–0.4)
Eos: 2 %
Hematocrit: 41.5 % (ref 34.0–46.6)
Hemoglobin: 13.8 g/dL (ref 11.1–15.9)
Immature Grans (Abs): 0 10*3/uL (ref 0.0–0.1)
Immature Granulocytes: 0 %
Lymphocytes Absolute: 2.4 10*3/uL (ref 0.7–3.1)
Lymphs: 33 %
MCH: 31.7 pg (ref 26.6–33.0)
MCHC: 33.3 g/dL (ref 31.5–35.7)
MCV: 95 fL (ref 79–97)
Monocytes Absolute: 0.6 10*3/uL (ref 0.1–0.9)
Monocytes: 8 %
Neutrophils Absolute: 4.2 10*3/uL (ref 1.4–7.0)
Neutrophils: 56 %
Platelets: 293 10*3/uL (ref 150–450)
RBC: 4.36 x10E6/uL (ref 3.77–5.28)
RDW: 12.1 % (ref 11.7–15.4)
WBC: 7.4 10*3/uL (ref 3.4–10.8)

## 2022-09-29 LAB — VITAMIN B12: Vitamin B-12: 325 pg/mL (ref 232–1245)

## 2022-09-29 LAB — LIPID PANEL
Chol/HDL Ratio: 1.6 ratio (ref 0.0–4.4)
Cholesterol, Total: 149 mg/dL (ref 100–199)
HDL: 91 mg/dL (ref >39)
LDL Chol Calc (NIH): 41 mg/dL (ref 0–99)
Triglycerides: 94 mg/dL (ref 0–149)
VLDL Cholesterol Cal: 17 mg/dL (ref 5–40)

## 2022-09-29 LAB — METHYLMALONIC ACID, SERUM: Methylmalonic Acid: 275 nmol/L (ref 0–378)

## 2022-09-29 LAB — T4, FREE: Free T4: 1.29 ng/dL (ref 0.82–1.77)

## 2022-09-29 LAB — TSH: TSH: 2.73 u[IU]/mL (ref 0.450–4.500)

## 2022-09-29 LAB — VITAMIN D 25 HYDROXY (VIT D DEFICIENCY, FRACTURES): Vit D, 25-Hydroxy: 77.6 ng/mL (ref 30.0–100.0)

## 2022-09-30 ENCOUNTER — Encounter: Payer: Self-pay | Admitting: Family Medicine

## 2022-09-30 NOTE — Assessment & Plan Note (Signed)
Control: at goal Recommend check sugars fasting daily. Recommend check feet daily. Recommend annual eye exams. Medicines: Continue farxiga 10 mg daily and metformin 1000 mg twice daily. Continue to work on eating a healthy diet and exercise.  Labs drawn today.

## 2022-09-30 NOTE — Assessment & Plan Note (Signed)
The current medical regimen is effective;  continue present plan and medications. Continue vitamin d 50 K once weekly. 

## 2022-09-30 NOTE — Assessment & Plan Note (Signed)
Previously well controlled Continue Synthroid at current dose  Recheck TSH and adjust Synthroid as indicated   

## 2022-09-30 NOTE — Assessment & Plan Note (Signed)
Continue aricept 10 mg before bed  Keep appt with Dr. Leta Baptist.

## 2022-09-30 NOTE — Assessment & Plan Note (Addendum)
Not at goal. Increase effexor xr 75 mg 3 in am. Continue zyprexa 5 mg one at night.

## 2022-09-30 NOTE — Assessment & Plan Note (Signed)
Continue b12 monthly 

## 2022-09-30 NOTE — Assessment & Plan Note (Addendum)
Await labs/testing for assessment and recommendations. Continue to work on eating a healthy diet and exercise.  Labs drawn today.   

## 2022-09-30 NOTE — Assessment & Plan Note (Addendum)
Not quite at goal. Continue Toprol xl 25 mg twice daily.  Continue to work on eating a healthy diet and exercise.  Labs drawn today.

## 2022-09-30 NOTE — Assessment & Plan Note (Signed)
Secondary to xarelto for atrial fibrillation.

## 2022-10-06 ENCOUNTER — Other Ambulatory Visit: Payer: Self-pay

## 2022-10-06 DIAGNOSIS — N289 Disorder of kidney and ureter, unspecified: Secondary | ICD-10-CM

## 2022-10-22 ENCOUNTER — Other Ambulatory Visit: Payer: Self-pay | Admitting: Family Medicine

## 2022-10-23 ENCOUNTER — Other Ambulatory Visit: Payer: Self-pay | Admitting: Cardiology

## 2022-10-23 DIAGNOSIS — I4811 Longstanding persistent atrial fibrillation: Secondary | ICD-10-CM

## 2022-10-24 ENCOUNTER — Other Ambulatory Visit: Payer: Self-pay

## 2022-10-24 ENCOUNTER — Telehealth: Payer: Self-pay

## 2022-10-24 DIAGNOSIS — E538 Deficiency of other specified B group vitamins: Secondary | ICD-10-CM

## 2022-10-24 DIAGNOSIS — F02B3 Dementia in other diseases classified elsewhere, moderate, with mood disturbance: Secondary | ICD-10-CM

## 2022-10-24 MED ORDER — DAPAGLIFLOZIN PROPANEDIOL 10 MG PO TABS: 10.0000 mg | ORAL_TABLET | Freq: Every day | ORAL | 1 refills | Status: DC

## 2022-10-24 MED ORDER — OLANZAPINE 5 MG PO TABS: ORAL_TABLET | ORAL | 0 refills | Status: DC

## 2022-10-24 MED ORDER — DONEPEZIL HCL 10 MG PO TABS: ORAL_TABLET | ORAL | 1 refills | Status: DC

## 2022-10-24 MED ORDER — SYNTHROID 25 MCG PO TABS: ORAL_TABLET | ORAL | 0 refills | Status: DC

## 2022-10-24 MED ORDER — CYANOCOBALAMIN 1000 MCG/ML IJ SOLN: INTRAMUSCULAR | 2 refills | Status: DC

## 2022-10-24 NOTE — Progress Notes (Signed)
Pt admitted she did not finish the application for [place] and that she has short term memory loss and forgets easily. So I told pt I will call her frequently to help remind her to find her application and get that back to Dr. Sedalia Muta office. Pt agreed.   Roxana Hires, CMA Clinical Pharmacist Assistant  [phone]

## 2022-10-24 NOTE — Telephone Encounter (Signed)
Pt last saw Dr Elberta Fortis 02/20/22, last labs 09/26/22 Creat 1.12, age 72, weight 73.9kg, CrCl 52.19, based on CrCl pt is on appropriate dosage of Xarelto 20mg  QD for afib.  Will refill rx.

## 2022-10-24 NOTE — Telephone Encounter (Cosign Needed)
I do not think patient ever processed her application for Farxiga and brought it back to Dr. Sedalia Muta. I will follow up to make sure patient still wants to continue trying to get PAP.   Roxana Hires, CMA Clinical Pharmacist Assistant  [phone]

## 2022-10-24 NOTE — Progress Notes (Signed)
Care Management & Coordination Services Pharmacy Team  Reason for Encounter: Medication coordination and delivery  Contacted patient to discuss medications and coordinate delivery from [facility].  Spoke with patient on 10/24/2022   Cycle dispensing form sent to Artelia Laroche for review.   Last adherence delivery date:08/07/22      Patient is due for next adherence delivery on: 11/03/22  This delivery to include: Adherence Packaging  90 Days  Metformin 1000 mg- 1 at B 1 at BT Metoprolol Succ ER 25 mg- 1 at B 1 at [employer] Multivtiamin-1 at B  Olanzapine 5 mg- 1 at BT Venlafaxine ER 75 mg- 3 at B Vitamin D2 1 B on Mondays Xarelto 20 mg- 1 at B Farxiga 10 mg-  1 at B Donepezil 10 mg- 1 at BT Synthroid 25 mcg- 1 BB Torsemide 20 mg- 1 at B on MWF  Patient declined the following medications this month: Epi Pen- Only Prn, does not need  Vesicare 5mg - [facility] B12 Injection- Last fill 06/05/22 90ds Lorazepam, Rosuvastatin and Losartan has been D/C by provider. I messaged Dr. Sedalia Muta to confirm pt is not on these anymore.    Refills requested from providers include: Olanzapine 5 mg Vitamin D2 Farxiga 10 mg Synthroid 25 mcg Donepezil 10 mg  Confirmed delivery date of 11/03/22, advised patient that pharmacy will contact them the morning of delivery.   Any concerns about your medications? No  How often do you forget or accidentally miss a dose? Never  Do you use a pillbox? No  Is patient in packaging Yes  If yes  What is the date on your next pill pack? 10/25/22, pt advised today that she got her January, Feb, March medication boxes all mixed up. She started with her Feb box in January and January box in March. She stated her Olanzapine was not included in her January packaging so I advised her to just start on April 9th packaging and discard the other months so she isn't mixed up and on track. Pt did confirm she has not missed any doses. She is unsure how  she got mixed up but she is on today's packaging so she is back on track. Pt understood instructions. Pt will give left over medications back to delivery driver on 8/46.  Any concerns or issues with your packaging? No concerns   Recent blood pressure readings are as follows:09/26/22 124/72 . Pt Libre 2 meter is not working so I Microbiologist Dr. Sedalia Muta to send in a new one so pt can start checking sugars at home.   Recent blood glucose readings are as follows:09/26/22 128   Chart review: Recent office visits:  09/26/22 Blane Ohara MD. Seen for depression. Increase effexor xr 75 mg 3 in am. Continue zyprexa 5 mg one at night. Increase donepezil to 10 mg before bed. Hold Omeprazole. D/C Lorazepam, Losartan Potassium, and Rosuvastatin.   Recent consult visits:  None  Hospital visits:  None  Medications: Outpatient Encounter Medications as of 10/24/2022  Medication Sig   ACIDOPHILUS LACTOBACILLUS PO Take 1 capsule by mouth daily.   APPLE CIDER VINEGAR PO Take 480 mg by mouth daily.   B Complex-C-Folic Acid TABS Take 1 tablet by mouth daily.   Continuous Blood Gluc Receiver (FREESTYLE LIBRE 2 READER) DEVI 1 each by Does not apply route as directed. E11.21 Check blood sugar four times daily as directed   cyanocobalamin (VITAMIN B12) 1000 MCG/ML injection INJECT ONE ML INTRAMUSCULARLY EVERY 30 DAYS  dapagliflozin propanediol (FARXIGA) 10 MG TABS tablet Take 1 tablet (10 mg total) by mouth daily.   donepezil (ARICEPT) 10 MG tablet TAKE ONE TABLET BY MOUTH EVERYDAY AT BEDTIME   EPINEPHRINE 0.3 mg/0.3 mL IJ SOAJ injection INJECT 0.3 MG INTRAMUSCULARLY AS NEEDED FOR anaphylaxis   metFORMIN (GLUCOPHAGE) 1000 MG tablet TAKE ONE TABLET BY MOUTH EVERY MORNING and TAKE ONE TABLET BY MOUTH EVERYDAY AT BEDTIME Please call for fasting follow-up appt with Dr Sedalia Muta.   metoprolol succinate (TOPROL-XL) 25 MG 24 hr tablet Take 1 tablet (25 mg total) by mouth in the morning and at bedtime. Taking 2 QD per Cardio per  patient   Multiple Vitamin (MULTIVITAMIN WITH MINERALS) TABS tablet Take 1 tablet by mouth daily.   OLANZapine (ZYPREXA) 5 MG tablet TAKE ONE TABLET BY MOUTH EVERYDAY AT BEDTIME   Probiotic Product (PROBIOTIC & ACIDOPHILUS EX ST PO) Take 1 tablet by mouth daily.    rivaroxaban (XARELTO) 20 MG TABS tablet TAKE ONE TABLET BY MOUTH EVERY MORNING   solifenacin (VESICARE) 5 MG tablet TAKE 1 TABLET BY MOUTH EVERY DAY   SYNTHROID 25 MCG tablet TAKE ONE TABLET BY MOUTH BEFORE BREAKFAST   Syringe/Needle, Disp, (SYRINGE 3CC/27GX1-1/4") 27G X 1-1/4" 3 ML MISC 1 each by Does not apply route every 30 (thirty) days.   torsemide (DEMADEX) 20 MG tablet TAKE ONE TABLET BY MOUTH ONCE WEEKLY ON MONDAY and TAKE ONE TABLET BY MOUTH ONCE WEEKLY ON WEDNESDAY and TAKE ONE TABLET BY MOUTH ONCE WEEKLY ON FRIDAY   triamcinolone ointment (KENALOG) 0.1 % Apply topically 2 (two) times daily.   venlafaxine XR (EFFEXOR-XR) 75 MG 24 hr capsule Take 3 capsules daily in am   Vitamin D, Ergocalciferol, (DRISDOL) 1.25 MG (50000 UNIT) CAPS capsule TAKE ONE CAPSULE BY MOUTH ONCE WEEKLY ON Monday   zinc gluconate 50 MG tablet Take 50 mg by mouth daily.   No facility-administered encounter medications on file as of 10/24/2022.   BP Readings from Last 3 Encounters:  09/26/22 124/72  03/14/22 122/77  03/08/22 130/78    Pulse Readings from Last 3 Encounters:  09/26/22 78  03/14/22 (!) 101  03/08/22 76    Lab Results  Component Value Date/Time   HGBA1C 6.9 (H) 09/26/2022 10:13 AM   HGBA1C 6.4 (H) 02/08/2022 09:13 AM   Lab Results  Component Value Date   CREATININE 1.12 (H) 09/26/2022   BUN 24 09/26/2022   GFRNONAA 53 (L) 09/03/2020   GFRAA 61 09/03/2020   NA 142 09/26/2022   K 5.3 (H) 09/26/2022   CALCIUM 9.8 09/26/2022   CO2 27 09/26/2022     Roxana Hires, CMA Clinical Pharmacist Assistant  [phone]

## 2022-11-03 ENCOUNTER — Telehealth: Payer: Self-pay

## 2022-11-03 ENCOUNTER — Other Ambulatory Visit: Payer: Self-pay

## 2022-11-03 NOTE — Progress Notes (Signed)
Care Management & Coordination Services Pharmacy Team  Reason for Encounter: Appointment Reminder  Contacted patient to confirm telephone appointment with Artelia Laroche, PharmD on 11/07/22 at 10:00 am.  Spoke with patient on 11/03/2022   Do you have any problems getting your medications? Yes If yes what types of problems are you experiencing? Financial barriers, pt is still holding on to her Xarelto/Farxiga application and forgets to bring it back to Dr. Renea Ee office to process. I told her I would try and call and remind her to bring it. Pt did confirm she could not find the application so we will mail it back to her. I advised it may be better to schedule a nurse visit with Lysle Dingwall so she can just come in and have all this filled out and taking care of in the office instead of waiting for her to get them and bring them back in. Pt agreed. I have sent a task to [place].   What is your top health concern you would like to discuss at your upcoming visit? Pt had another fall on 10/28/22.. She fell out of her bed and fell on her face. She has bruises on her face from the fall. Pt was not seen by any providers since fall. Pt stated her vision was cloudy when she first fell and her vision is clearing up. She has a knot on her eyebrow and its swollen from her eye to her ear. Pt felt she didn't need to go to the hospital or doctors. Pt neighbor came by to see her yesterday and take a look at her and check on her. She wants to look into getting a life alert for these reasons and she lives alone. Pt denies any headaches today. She is just sore but felt she didn't need immediate assistance.   Have you seen any other providers since your last visit with PCP? No   Chart review:  Recent office visits:  None  Recent consult visits:  None  Hospital visits:  None   Star Rating Drugs:  Medication:  Last Fill: Day Supply Farxiga   10/31/22-07/27/22 90ds Metformin   10/31/22-08/03/22 90ds  Care Gaps: Annual  wellness visit in last year? Yes  If Diabetic: Last eye exam / retinopathy screening: 09/16/20 Last diabetic foot exam:09/26/22   Roxana Hires, [facility] Clinical Pharmacist Assistant  [phone]

## 2022-11-06 ENCOUNTER — Telehealth: Payer: Self-pay

## 2022-11-06 DIAGNOSIS — M25511 Pain in right shoulder: Secondary | ICD-10-CM | POA: Diagnosis not present

## 2022-11-06 NOTE — Progress Notes (Cosign Needed)
Patient unable to locate Farxiga PAP with [employer] and [employer] Pap for Xarelto that was mailed months ago, sent email to Renata Caprice, manager with pap to print for patient to receive assistance from PCP team at appointment with Dr Sedalia Muta on 11/07/2022. If patient needs further assistance we will contact patient after her visit.   Billee Cashing, CMA Clinical Pharmacist Assistant [phone]

## 2022-11-07 ENCOUNTER — Ambulatory Visit: Payer: PPO

## 2022-11-07 NOTE — Patient Outreach (Signed)
Care Management & Coordination Services Pharmacy Note  11/07/2022 Name:  April Munoz MRN:  [id number] DOB:  Aug 21, 1950  Summary: -Patient husband passed Sep 2023   Recommendations/Changes made from today's visit: -Patient was wondering if it was possible to get a Life Alert. Will co-sign PCP and ask about process -Patient states increase in Venlafaxine isn't working. She was bumped from 2/day to 3/day and she states it made her feel "Draggy-er." Could we try something else? Patient has f/u with PCP in 6 weeks which is perfect amount of time to evaluate new therapy. Will co-sign PCP to ask   Subjective: April Munoz is an 72 y.o. year old female who is a primary patient of Cox, Kirsten, MD.  The care coordination team was consulted for assistance with disease management and care coordination needs.    Engaged with patient by telephone for follow up visit.  Recent office visits:  None   Recent consult visits:  None   Hospital visits:  None   Objective:  Lab Results  Component Value Date   CREATININE 1.12 (H) 09/26/2022   BUN 24 09/26/2022   EGFR 52 (L) 09/26/2022   GFRNONAA 53 (L) 09/03/2020   GFRAA 61 09/03/2020   NA 142 09/26/2022   K 5.3 (H) 09/26/2022   CALCIUM 9.8 09/26/2022   CO2 27 09/26/2022   GLUCOSE 111 (H) 09/26/2022    Lab Results  Component Value Date/Time   HGBA1C 6.9 (H) 09/26/2022 10:13 AM   HGBA1C 6.4 (H) 02/08/2022 09:13 AM   MICROALBUR 80 03/15/2021 10:11 PM   MICROALBUR 150 05/27/2020 09:39 AM    Last diabetic Eye exam:  Lab Results  Component Value Date/Time   HMDIABEYEEXA No Retinopathy 10/14/2020 12:00 AM    Last diabetic Foot exam: No results found for: "HMDIABFOOTEX"   Lab Results  Component Value Date   CHOL 149 09/26/2022   HDL 91 09/26/2022   LDLCALC 41 09/26/2022   TRIG 94 09/26/2022   CHOLHDL 1.6 09/26/2022       Latest Ref Rng & Units 09/26/2022   10:13 AM 02/08/2022    9:13 AM 06/15/2021   10:19 AM  Hepatic Function   Total Protein 6.0 - 8.5 g/dL 6.2  6.9  6.6   Albumin 3.8 - 4.8 g/dL 4.3  4.6  4.4   AST 0 - 40 IU/L 15  29  26    ALT 0 - 32 IU/L 26  30  24    Alk Phosphatase 44 - 121 IU/L 56  100  100   Total Bilirubin 0.0 - 1.2 mg/dL 0.4  0.5  0.5     Lab Results  Component Value Date/Time   TSH 2.730 09/26/2022 10:13 AM   TSH 5.700 (H) 02/08/2022 09:13 AM   FREET4 1.29 09/26/2022 10:13 AM   FREET4 1.26 02/08/2022 09:13 AM       Latest Ref Rng & Units 09/26/2022   10:13 AM 02/08/2022    9:13 AM 10/18/2021    1:50 PM  CBC  WBC 3.4 - 10.8 x10E3/uL 7.4  7.4  6.3   Hemoglobin 11.1 - 15.9 g/dL 40.9  81.1  91.4   Hematocrit 34.0 - 46.6 % 41.5  43.7  40.4   Platelets 150 - 450 x10E3/uL 293  344  308     Lab Results  Component Value Date/Time   VD25OH 77.6 09/26/2022 10:13 AM   VD25OH 58.5 02/08/2022 09:13 AM   VITAMINB12 325 09/26/2022 10:13 AM   VITAMINB12 598 02/08/2022 09:13 AM  Clinical ASCVD: Yes  The 10-year ASCVD risk score (Arnett DK, et al., 2019) is: 27%   Values used to calculate the score:     Age: 71 years     Sex: Female     Is Non-Hispanic African American: No     Diabetic: Yes     Tobacco smoker: No     Systolic Blood Pressure: 124 mmHg     Is BP treated: Yes     HDL Cholesterol: 91 mg/dL     Total Cholesterol: 149 mg/dL    Other: ([id number] if Afib, MMRC or CAT for COPD, ACT, DEXA)     09/26/2022    9:24 AM 06/23/2022   10:06 AM 03/08/2022   11:12 AM  Depression screen PHQ 2/9  Decreased Interest 3 2 2   Down, Depressed, Hopeless 3 3 2   PHQ - 2 Score 6 5 4   Altered sleeping 3 3 3   Tired, decreased energy 3 3 1   Change in appetite 3 0 1  Feeling bad or failure about yourself  1 1 1   Trouble concentrating 3 1 2   Moving slowly or fidgety/restless 3 1 1   Suicidal thoughts 0 0 0  PHQ-9 Score 22 14 13   Difficult doing work/chores Very difficult Extremely dIfficult Extremely dIfficult     Social History   Tobacco Use  Smoking Status Never  Smokeless Tobacco  Never   BP Readings from Last 3 Encounters:  09/26/22 124/72  03/14/22 122/77  03/08/22 130/78   Pulse Readings from Last 3 Encounters:  09/26/22 78  03/14/22 (!) 101  03/08/22 76   Wt Readings from Last 3 Encounters:  09/26/22 163 lb (73.9 kg)  03/14/22 178 lb 2 oz (80.8 kg)  03/08/22 177 lb (80.3 kg)   BMI Readings from Last 3 Encounters:  09/26/22 25.53 kg/m  03/14/22 27.08 kg/m  03/08/22 27.72 kg/m    Allergies  Allergen Reactions   Atorvastatin     myalgias    Medications Reviewed Today     Reviewed by Blane Ohara, MD (Physician) on 09/30/22 at 2319  Med List Status: <None>   Medication Order Taking? Sig Documenting Provider Last Dose Status Informant  ACIDOPHILUS LACTOBACILLUS PO [id number] No Take 1 capsule by mouth daily. [provider] Taking Active Self  APPLE CIDER VINEGAR PO [id number] No Take 480 mg by mouth daily. [provider] Taking Active Self  B Complex-C-Folic Acid TABS [id number] No Take 1 tablet by mouth daily. [provider] Taking Active Self  Continuous Blood Gluc Receiver (FREESTYLE LIBRE 2 READER) DEVI [id number] No 1 each by Does not apply route as directed. E11.21 Check blood sugar four times daily as directed Cox, Kirsten, MD Taking Active   cyanocobalamin (VITAMIN B12) 1000 MCG/ML injection [id number]  INJECT ONE ML INTRAMUSCULARLY EVERY 30 DAYS Cox, Kirsten, MD  Active   dapagliflozin propanediol (FARXIGA) 10 MG TABS tablet [id number] No Take 1 tablet (10 mg total) by mouth daily. Cox, Kirsten, MD Taking Active   donepezil (ARICEPT) 10 MG tablet [id number]  TAKE ONE TABLET BY MOUTH EVERYDAY AT BEDTIME Cox, Kirsten, MD  Active   EPINEPHRINE 0.3 mg/0.3 mL IJ SOAJ injection [id number]  INJECT 0.3 MG INTRAMUSCULARLY AS NEEDED FOR anaphylaxis Cox, Kirsten, MD  Active   metFORMIN (GLUCOPHAGE) 1000 MG tablet [id number] No Take 1 tablet (1,000 mg total) by mouth 2 (two) times daily with a meal. Cox, Kirsten, MD Taking Active    metoprolol succinate (TOPROL-XL) 25 MG 24 hr tablet [id number] No Take  1 tablet (25 mg total) by mouth in the morning and at bedtime. Taking 2 QD per Cardio per patient Regan Lemming, MD Taking Active   Multiple Vitamin (MULTIVITAMIN WITH MINERALS) TABS tablet [id number] No Take 1 tablet by mouth daily. [provider] Taking Active Self  OLANZapine (ZYPREXA) 5 MG tablet [id number]  TAKE ONE TABLET BY MOUTH EVERYDAY AT BEDTIME Cox, Kirsten, MD  Active   Probiotic Product (PROBIOTIC & ACIDOPHILUS EX ST PO) [id number] No Take 1 tablet by mouth daily.  [provider] Taking Active Self           Med Note Lamona Curl   Tue May 27, 2019  3:15 PM)    rivaroxaban (XARELTO) 20 MG TABS tablet [id number] No TAKE ONE TABLET BY MOUTH EVERY MORNING Camnitz, Andree Coss, MD Taking Active   solifenacin (VESICARE) 5 MG tablet [id number]  TAKE 1 TABLET BY MOUTH EVERY DAY Marianne Sofia, PA-C  Active   SYNTHROID 25 MCG tablet [id number]  TAKE ONE TABLET BY MOUTH BEFORE BREAKFAST Cox, Kirsten, MD  Active   Syringe/Needle, Disp, (SYRINGE 3CC/27GX1-1/4") 27G X 1-1/4" 3 ML MISC [id number] Yes 1 each by Does not apply route every 30 (thirty) days. Cox, Kirsten, MD  Active   torsemide (DEMADEX) 20 MG tablet [id number]  TAKE ONE TABLET BY MOUTH ONCE WEEKLY ON MONDAY and TAKE ONE TABLET BY MOUTH ONCE WEEKLY ON WEDNESDAY and TAKE ONE TABLET BY MOUTH ONCE WEEKLY ON FRIDAY Baldo Daub, MD  Active   triamcinolone ointment (KENALOG) 0.1 % [id number]  Apply topically 2 (two) times daily. [provider]  Active   venlafaxine XR (EFFEXOR-XR) 75 MG 24 hr capsule [id number]  Take 3 capsules daily in am Cox, Kirsten, MD  Active   Vitamin D, Ergocalciferol, (DRISDOL) 1.25 MG (50000 UNIT) CAPS capsule [id number]  TAKE ONE CAPSULE BY MOUTH ONCE WEEKLY ON Monday Blane Ohara, MD  Active   zinc gluconate 50 MG tablet [id number] No Take 50 mg by mouth daily. [provider] Taking Active Self             SDOH:  (Social Determinants of Health) assessments and interventions performed: Yes SDOH Interventions    Flowsheet Row Care Coordination from 11/07/2022 in [facility] Office Visit from 09/26/2022 in [facility] Chronic Care Management from 06/23/2022 in [facility] Clinical Support from 02/20/2022 in [facility] Office Visit from 02/08/2022 in [facility] Chronic Care Management from 07/04/2021 in [facility]  SDOH Interventions        Transportation Interventions Intervention Not Indicated -- Intervention Not Indicated -- -- Intervention Not Indicated  Depression Interventions/Treatment  -- Currently on Treatment Currently on Treatment Currently on Treatment Medication --  Financial Strain Interventions Other (Comment)  [PAP] -- Other (Comment)  [PAP] -- -- Other (Comment)  [PAP's completed]       Name and location of Current pharmacy:  [facility] - [place], [place] [facility] [place] [address] [place] [address] Phone: [phone] Fax: [phone]  [id number] - [address]  - [address] [place] [address] Phone: [phone] Fax: [phone]   Compliance/Adherence/Medication fill history: Star Rating Drugs:  Medication:                Last Fill:         Day Supply Marcelline Deist  10/31/22-07/27/22 90ds Metformin                    10/31/22-08/03/22 90ds   Care Gaps: Annual wellness visit in last year? Yes   If Diabetic: Last eye exam / retinopathy screening: 09/16/20 Last diabetic foot exam:09/26/22   Assessment/Plan    Hyperlipidemia: (LDL goal < 55) The 10-year ASCVD risk score (Arnett DK, et al., 2019) is: 27%   Values used to calculate the score:     Age: 7 years     Sex: Female     Is Non-Hispanic African American: No     Diabetic: Yes     Tobacco  smoker: No     Systolic Blood Pressure: 124 mmHg     Is BP treated: Yes     HDL Cholesterol: 91 mg/dL     Total Cholesterol: 149 mg/dL Lab Results  Component Value Date   CHOL 149 09/26/2022   CHOL 187 02/08/2022   CHOL 219 (H) 06/15/2021   Lab Results  Component Value Date   HDL 91 09/26/2022   HDL 87 02/08/2022   HDL 75 06/15/2021   Lab Results  Component Value Date   LDLCALC 41 09/26/2022   LDLCALC 79 02/08/2022   LDLCALC 115 (H) 06/15/2021   Lab Results  Component Value Date   TRIG 94 09/26/2022   TRIG 126 02/08/2022   TRIG 168 (H) 06/15/2021   Lab Results  Component Value Date   CHOLHDL 1.6 09/26/2022   CHOLHDL 2.1 02/08/2022   CHOLHDL 2.9 06/15/2021   No results found for: "LDLDIRECT" Last vitamin D Lab Results  Component Value Date   VD25OH 77.6 09/26/2022   Lab Results  Component Value Date   TSH 2.730 09/26/2022  -not ideally controlled -Current treatment: N/A -Medications previously tried: pravastatin, Lovaza, Rosuvastatin, Atorvastatin -Current dietary patterns: reports eating at night. Loves eggs.  -Current exercise habits: none reported -Educated on Cholesterol goals;  Benefits of statin for ASCVD risk reduction; Importance of limiting foods high in cholesterol; Exercise goal of 150 minutes per week; -Counseled on diet and exercise extensively Jan 2023: Recommend increasing statin December 2023: Recommended to continue current medication   Diabetes (A1c goal <7%) Lab Results  Component Value Date   HGBA1C 6.9 (H) 09/26/2022   HGBA1C 6.4 (H) 02/08/2022   HGBA1C 6.1 (H) 06/15/2021   Lab Results  Component Value Date   MICROALBUR 80 03/15/2021   LDLCALC 41 09/26/2022   CREATININE 1.12 (H) 09/26/2022    Lab Results  Component Value Date   NA 142 09/26/2022   K 5.3 (H) 09/26/2022   CREATININE 1.12 (H) 09/26/2022   EGFR 52 (L) 09/26/2022   GFRNONAA 53 (L) 09/03/2020   GLUCOSE 111 (H) 09/26/2022    Lab Results  Component Value  Date   WBC 7.4 09/26/2022   HGB 13.8 09/26/2022   HCT 41.5 09/26/2022   MCV 95 09/26/2022   PLT 293 09/26/2022    Lab Results  Component Value Date   LABMICR 37.5 02/08/2022   MICROALBUR 80 03/15/2021   MICROALBUR 150 05/27/2020  -Controlled -Current medications: metformin 1000 mg daily Appropriate, Effective, Safe, Accessible Dapagliflozin  Appropriate, Effective, Safe, Query accessible 2023: PAP Approved 2024: Waiting on PAP -Medications previously tried: none reported  -Current home glucose readings fasting glucose: Jan 2023: Doesn't write them down but states no lows. Recommended to start writing down post prandial glucose: not reported -Denies hypoglycemic/hyperglycemic symptoms -Current meal patterns:  Patient reports eating during the  night.  Not eating well since cardioversion.  -Current exercise: none reported -Educated onA1c and blood sugar goals; Complications of diabetes including kidney damage, retinal damage, and cardiovascular disease; Benefits of weight loss; Benefits of routine self-monitoring of blood sugar; -Counseled to check feet daily and get yearly eye exams -Counseled on diet and exercise extensively April 2023: Will ask PCP to change Metformin BID to QD which is how patient is taking medication December 2023: Patient has been getting meds from [facility] when PAP was approved. Will have CCM team lok into why and fix ASAP April 2024: Patient hasn't filled out Dapagliflozin PAP out. Her memory is failing and she forgets. Lysle Dingwall and Duwayne Heck are working with patient to ensure this gets filled out ASAP     Atrial Fibrillation (Goal: prevent stroke and major bleeding) -controlled  -CHADSVASC: 6 -Current treatment: Rate control:  metoprolol xl  BID Appropriate, Effective, Safe, Accessible Anticoagulation:  Xarelto 20 mg daily Appropriate, Effective, Safe, Query accessible -Medications previously tried: Eliquis  -Home BP and HR readings: none  reported  -Counseled on increased risk of stroke due to Afib and benefits of anticoagulation for stroke prevention; importance of adherence to anticoagulant exactly as prescribed; avoidance of NSAIDs due to increased bleeding risk with anticoagulants; -Counseled on diet and exercise extensively April 2023: Patient would like Metoprolol Succ  changed to  Succ BID April 2024: Patient can't afford Xarelto. This is most likely because patient has hit [facility]. Will work on Dapagliflozin PAP ASAP and will try Xarelto PAP   Osteoporosis / Osteopenia (Goal  Minimize risk of break or fracture) -controlled -Last DEXA Scan:  Dual Femur Neck Right:  04/01/21: -2.0 08/05/18: -1.56 03/31/16:-1.6 03/25/09:-1.4 12/03/01: -0.8 Dual Femur Total Mean:  04/01/21: -1.5 08/05/18: -1.1 03/31/16: -0.7  03/25/09: -0.7 12/03/01: -0.0             -Current treatment  calcium/vitamin D/vitamin k chew daily Appropriate, Effective, Safe, Accessible Vitamin D 50,000 units weekly on Monday Appropriate, Effective, Safe, Accessible -Medications previously tried: none reported  -Recommend [phone] units of vitamin D daily. Recommend 1200 mg of calcium daily from dietary and supplemental sources. Recommend weight-bearing and muscle strengthening exercises for building and maintaining bone density. -Counseled on diet and exercise extensively Jan 2023: Patient unsure when last Dexa was, will ask PCP April 2023: DEXA scheduled for 12/14/21 December 2023: No need for Dexa until next year   Depression (Goal: manage symptoms of depression) -not ideally controlled    09/26/2022    9:24 AM 06/23/2022   10:06 AM 03/08/2022   11:12 AM  Depression screen PHQ 2/9  Decreased Interest Down, Depressed, Hopeless PHQ - 2 Score Altered sleeping Tired, decreased energy Change in appetite 3 0 1  Feeling bad or failure about yourself  Trouble concentrating Moving slowly or fidgety/restless  Suicidal thoughts 0 0 0  PHQ-9 Score Difficult doing work/chores Very difficult Extremely dIfficult Extremely dIfficult      02/14/2021   11:03 AM 01/31/2021    9:48 AM  GAD 7 : Generalized Anxiety Score  Nervous, Anxious, on Edge 3 3  Control/stop worrying 3 3  Worry too much - different things 2 3  Trouble relaxing 3 3  Restless 3 3  Easily annoyed or irritable 0 0  Afraid - awful might happen  2 3  Total GAD 7 Score 16 18  Anxiety Difficulty Not difficult at all Very difficult  -Current treatment  Venlafaxine XR 75mg  Appropriate, Query effective,  Olanzapine 5mg  HS Appropriate, Query effective,  -Medications previously tried: Trintellix, Rexulti, states she has tried other medications in the past but has been on venlafaxine of and on for 30 years, venlafaxine Jan 2023: Patient states her grandson and Fiance just moved in with her and she is happy to help them, but stated it is a stressful situation. Not open to changing things now with all the change that's going on in her life April 2023: Patient's brother passed last week, unable to discuss this too much today. Unable to do PHQ9 December 2023: Within 6 months, her brother and husband passed. Recommend she call hospice (From husband) and talk with grief counselor. Recommend she come in to see PCP ASAP April 2024: Patient states increase in Venlafaxine isn't working. She was bumped from 2/day to 3/day and she states it made her feel "Draggy-er." Will ask PCP for alternative  CP F/U August 2024  Artelia Laroche, Ilda Basset.D. - [phone]

## 2022-11-13 ENCOUNTER — Telehealth: Payer: Self-pay

## 2022-11-13 NOTE — Progress Notes (Addendum)
Called patient regarding patient assistance application for Farxiga with [employer] and Xarelto with [employer]. I thought patient had an appointment scheduled with Dr Sedalia Muta on 11/07/2022 but must have been mistaken, patient does not have an appointment until June, made patient aware that I am mailing her applications to her and if she needs any assistance with filling them out to give [place] a call. I also offered meeting patient at the clinic to assist in filling out paperwork, patient wants to wait for formsto arrive in the mail to see if she needs any help.   Billee Cashing, CMA Clinical Pharmacist Assistant [phone]

## 2022-11-27 ENCOUNTER — Other Ambulatory Visit: Payer: Self-pay

## 2022-11-27 MED ORDER — BUPROPION HCL ER (XL) 150 MG PO TB24: 150.0000 mg | ORAL_TABLET | Freq: Every morning | ORAL | 0 refills | Status: DC

## 2022-12-26 ENCOUNTER — Encounter: Payer: Self-pay | Admitting: Specialist

## 2023-01-09 NOTE — Assessment & Plan Note (Signed)
Not quite at goal. Continue Toprol xl 25 mg twice daily.  Continue to work on eating a healthy diet and exercise.  Labs drawn today.   

## 2023-01-09 NOTE — Assessment & Plan Note (Signed)
Previously well controlled Continue Synthroid at current dose  Recheck TSH and adjust Synthroid as indicated   

## 2023-01-09 NOTE — Assessment & Plan Note (Signed)
Continue aricept 10 mg before bed.  ?

## 2023-01-09 NOTE — Assessment & Plan Note (Signed)
Secondary to xarelto for atrial fibrillation.  

## 2023-01-09 NOTE — Assessment & Plan Note (Signed)
Not at goal. Continue effexor xr 75 mg 3 in am. Continue zyprexa 5 mg one at night.

## 2023-01-09 NOTE — Assessment & Plan Note (Signed)
Await labs/testing for assessment and recommendations. Continue to work on eating a healthy diet and exercise.  Labs drawn today.   

## 2023-01-09 NOTE — Assessment & Plan Note (Signed)
Control: at goal Recommend check sugars fasting daily. Recommend check feet daily. Recommend annual eye exams. Medicines: Continue farxiga 10 mg daily and metformin 1000 mg twice daily. Continue to work on eating a healthy diet and exercise.  Labs drawn today.    

## 2023-01-09 NOTE — Progress Notes (Unsigned)
Subjective:  Patient ID: April Munoz, female    DOB: August 10, 1950  Age: 72 y.o. MRN: [id number]  Chief Complaint  Patient presents with   Medical Management of Chronic Issues    HPI   Depression: Effexor xr 75 mg one  capsules daily and olanzeprine 5 mg qhs. Wellbutrin XL 150 mg every morning.  Having difficulty getting to sleep at night.  Continues to feel down and depressed since the death of her husband.  She does have someone staying with her at night which gives her comfort since she has had frequent falls.    Diabetes: Currently on farxiga 10 mg daily and metformin 1000 mg twice daily, needs eye exam, checks feet daily. Check sugars about 4 times per week. 120-140s. A1c 6.9 Per last labs patient was to return for 2 week nurse visit for recheck of kidney function but did not.   HTN: Metoprolol 25 mg twice daily    Thyroid: Synthroid 25 mcg daily   Lipid: Was on lipitor but has run out   Atrial flutter: on xarelto 20 mg daily and metoprolol 25 mg twice daily.  On torsemide 20 mg once weekly    Urge incontinence: on vesicare 5 mg daily.    GERD: omeprazole 20 mg daily. Has not tried to come off of it.    B12 deficiency: on b12 shots monthly.    Alzheimer's dementia: sees neurology. Currently on aricept 10 mg before bed. Short term memory loss has worsened.      01/10/2023    9:17 AM 09/26/2022    9:24 AM 06/23/2022   10:06 AM 03/08/2022   11:12 AM 02/20/2022    4:10 PM  Depression screen PHQ 2/9  Decreased Interest 3 3 2 2 3   Down, Depressed, Hopeless 3 3 3 2 3   PHQ - 2 Score 6 6 5 4 6   Altered sleeping 3 3 3 3 3   Tired, decreased energy 3 3 3 1 3   Change in appetite 1 3 0 1 3  Feeling bad or failure about yourself  2 1 1 1 1   Trouble concentrating 2 3 1 2 3   Moving slowly or fidgety/restless 2 3 1 1 3   Suicidal thoughts 0 0 0 0 0  PHQ-9 Score 19 22 14 13 22   Difficult doing work/chores Very difficult Very difficult Extremely dIfficult Extremely dIfficult Very difficult         01/10/2023    9:17 AM  Fall Risk   Falls in the past year? 1  Number falls in past yr: 1  Injury with Fall? 1  Risk for fall due to : History of fall(s)  Follow up Falls evaluation completed;Falls prevention discussed    Patient Care Team: Blane Ohara, MD as PCP - General (Family Medicine) Baldo Daub, MD as PCP - Cardiology (Cardiology) Regan Lemming, MD as PCP - Electrophysiology (Cardiology) Baldo Daub, MD as Consulting Physician (Cardiology) Misenheimer, Marcial Pacas, MD as Consulting Physician (Unknown Physician Specialty) Darleene Cleaver, MD as Consulting Physician (General Surgery) Zettie Pho, [facility] (Inactive) as Pharmacist (Pharmacist)   Review of Systems  Constitutional:  Positive for fatigue. Negative for chills and fever.  HENT:  Positive for congestion. Negative for ear pain, rhinorrhea and sore throat.   Respiratory:  Negative for cough and shortness of breath.   Cardiovascular:  Negative for chest pain.  Gastrointestinal:  Negative for abdominal pain, constipation, diarrhea, nausea and vomiting.  Genitourinary:  Positive for urgency. Negative for dysuria.  Musculoskeletal:  Positive for arthralgias (right hip) and myalgias (restless legs at night). Negative for back pain.  Neurological:  Positive for light-headedness and numbness (pain and numbness in feet). Negative for dizziness and weakness.  Psychiatric/Behavioral:  Positive for dysphoric mood and sleep disturbance. The patient is nervous/anxious.     Current Outpatient Medications on File Prior to Visit  Medication Sig Dispense Refill   buPROPion (WELLBUTRIN XL) 150 MG 24 hr tablet Take 1 tablet (150 mg total) by mouth every morning. 90 tablet 0   Melatonin 10 MG TABS Take by mouth.     ACIDOPHILUS LACTOBACILLUS PO Take 1 capsule by mouth daily.     B Complex-C-Folic Acid TABS Take 1 tablet by mouth daily.     Continuous Blood Gluc Receiver (FREESTYLE LIBRE 2 READER) DEVI 1 each by  Does not apply route as directed. E11.21 Check blood sugar four times daily as directed 1 each 0   cyanocobalamin (VITAMIN B12) 1000 MCG/ML injection INJECT ONE ML INTRAMUSCULARLY EVERY 30 DAYS 3 mL 2   dapagliflozin propanediol (FARXIGA) 10 MG TABS tablet Take 1 tablet (10 mg total) by mouth daily. 90 tablet 1   donepezil (ARICEPT) 10 MG tablet TAKE ONE TABLET BY MOUTH EVERYDAY AT BEDTIME 90 tablet 1   EPINEPHRINE 0.3 mg/0.3 mL IJ SOAJ injection INJECT 0.3 MG INTRAMUSCULARLY AS NEEDED FOR anaphylaxis 2 each 1   metFORMIN (GLUCOPHAGE) 1000 MG tablet TAKE ONE TABLET BY MOUTH EVERY MORNING and TAKE ONE TABLET BY MOUTH EVERYDAY AT BEDTIME Please call for fasting follow-up appt with Dr Sedalia Muta. 180 tablet 1   metoprolol succinate (TOPROL-XL) 25 MG 24 hr tablet TAKE ONE TABLET BY MOUTH EVERY MORNING and TAKE ONE TABLET BY MOUTH EVERYDAY AT BEDTIME 180 tablet 0   Multiple Vitamin (MULTIVITAMIN WITH MINERALS) TABS tablet Take 1 tablet by mouth daily.     OLANZapine (ZYPREXA) 5 MG tablet TAKE ONE TABLET BY MOUTH EVERYDAY AT BEDTIME 90 tablet 0   solifenacin (VESICARE) 5 MG tablet TAKE 1 TABLET BY MOUTH EVERY DAY 90 tablet 0   SYNTHROID 25 MCG tablet TAKE ONE TABLET BY MOUTH BEFORE BREAKFAST 90 tablet 0   Syringe/Needle, Disp, (SYRINGE 3CC/27GX1-1/4") 27G X 1-1/4" 3 ML MISC 1 each by Does not apply route every 30 (thirty) days. 100 each 0   torsemide (DEMADEX) 20 MG tablet TAKE ONE TABLET BY MOUTH ONCE WEEKLY ON MONDAY and TAKE ONE TABLET BY MOUTH ONCE WEEKLY ON WEDNESDAY and TAKE ONE TABLET BY MOUTH ONCE WEEKLY ON FRIDAY 90 tablet 0   triamcinolone ointment (KENALOG) 0.1 % Apply topically 2 (two) times daily.     Vitamin D, Ergocalciferol, (DRISDOL) 1.25 MG (50000 UNIT) CAPS capsule TAKE ONE CAPSULE BY MOUTH ONCE WEEKLY ON Monday 12 capsule 0   XARELTO 20 MG TABS tablet TAKE ONE TABLET BY MOUTH EVERY MORNING 90 tablet 1   zinc gluconate 50 MG tablet Take 50 mg by mouth daily.     No current  facility-administered medications on file prior to visit.   Past Medical History:  Diagnosis Date   Anxiety    Arthritis    Ataxic gait    Atrial fibrillation (HCC)    Atrophy of thyroid    B12 deficiency 10/31/2017   Cellulitis of abdominal wall 07/07/2016   Chronic anticoagulation 05/31/2018   Depression    Depression, major, recurrent, mild (HCC) 09/16/2019   Diabetes mellitus without complication (HCC)    Diabetic glomerulopathy (HCC) 09/16/2019   Esophageal stricture 06/05/2017  Essential hypertension 02/24/2013   Fatigue 01/20/2020   Fibromyalgia 02/24/2013   Full dentures    Hereditary and idiopathic peripheral neuropathy 02/24/2013   High risk medication use 11/07/2017   Hyperlipidemia 02/24/2013   Hypertension    Hypertensive heart disease 02/24/2013   Incontinence    Insomnia    Longstanding persistent atrial fibrillation (HCC) 09/16/2019   Mild vitamin D deficiency 09/16/2019   Mixed hyperlipidemia 02/24/2013   Murmur, cardiac 08/15/2015   Neuropathy in diabetes (HCC) 10/31/2017   Osteoporosis    Other amnesia    Other amnesia    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    Paroxysmal atrial fibrillation (HCC) 08/15/2015   CHADS2vasc=3 CHADS2vasc=3   Primary insomnia    Restless legs syndrome 03/14/2013   Secondary hypothyroidism 09/16/2019   Thoracic or lumbosacral neuritis or radiculitis 02/24/2013   Type 2 diabetes mellitus, without long-term current use of insulin (HCC) 02/24/2013   Urge incontinence of urine 09/16/2019   Wears glasses    Past Surgical History:  Procedure Laterality Date   A-FLUTTER ABLATION N/A 07/30/2019   Procedure: A-FLUTTER ABLATION;  Surgeon: Regan Lemming, MD;  Location: [facility];  Service: Cardiovascular;  Laterality: N/A;   ATRIAL FIBRILLATION ABLATION N/A 03/21/2019   Procedure: ATRIAL FIBRILLATION ABLATION;  Surgeon: Regan Lemming, MD;  Location: [facility];  Service: Cardiovascular;  Laterality:  N/A;   ATRIAL FIBRILLATION ABLATION N/A 10/27/2021   Procedure: ATRIAL FIBRILLATION ABLATION;  Surgeon: Regan Lemming, MD;  Location: [facility];  Service: Cardiovascular;  Laterality: N/A;   BREAST REDUCTION SURGERY  1983   BUBBLE STUDY  11/26/2020   Procedure: BUBBLE STUDY;  Surgeon: Chrystie Nose, MD;  Location: [facility] ENDOSCOPY;  Service: Cardiovascular;;   CARDIOVERSION N/A 11/26/2020   Procedure: CARDIOVERSION;  Surgeon: Chrystie Nose, MD;  Location:  [facility] ENDOSCOPY;  Service: Cardiovascular;  Laterality: N/A;   CATARACT EXTRACTION, BILATERAL  2018, 2019   CHOLECYSTECTOMY  1971   open   DIAGNOSTIC LAPAROSCOPY  2010   lysis of adhesions   DILATION AND CURETTAGE OF UTERUS  2004   KNEE ARTHROSCOPY  1998   left   KNEE ARTHROSCOPY W/ LATERAL RETINACULAR REPAIR     MASS EXCISION Left 11/11/2013   Procedure: EXCISION MUCOID CYST LEFT INDEX FINGER/DEBRIDEMENT LEFT INDEX FINGER;  Surgeon: Nicki Reaper, MD;  Location: [facility];  Service: Orthopedics;  Laterality: Left;  ANESTHESIA: IV REGIONAL/FAB   SHOULDER ARTHROSCOPY W/ ROTATOR CUFF REPAIR  2007   left   TEE WITHOUT CARDIOVERSION N/A 11/26/2020   Procedure: TRANSESOPHAGEAL ECHOCARDIOGRAM (TEE);  Surgeon: Chrystie Nose, MD;  Location: [facility]COPY;  Service: Cardiovascular;  Laterality: N/A;   TRIGGER FINGER RELEASE Left 11/11/2013   Procedure: RELEASE A-1 PULLEY LEFT RING FINGER;  Surgeon: Nicki Reaper, MD;  Location: [facility];  Service: Orthopedics;  Laterality: Left;   UMBILICAL HERNIA REPAIR  2008, 2010    Family History  Problem Relation Age of Onset   Atrial fibrillation Mother    Heart attack Mother    Heart failure Father    Heart disease Brother    Heart attack Brother    Bladder Cancer Brother    Diabetes Maternal Grandmother    Heart failure Paternal Grandfather    Stroke Paternal Grandfather    Social History   Socioeconomic History   Marital status: Married    Spouse  name: Raiford Noble   Number of children: 2  Years of education: Not on file   Highest education level: Some college, no degree  Occupational History   Occupation: retired  Tobacco Use   Smoking status: Never   Smokeless tobacco: Never  Vaping Use   Vaping Use: Never used  Substance and Sexual Activity   Alcohol use: Yes    Alcohol/week: 3.0 - 4.0 standard drinks of alcohol    Types: 1 - 2 Glasses of wine, 2 Shots of liquor per week    Comment: 4 times a week   Drug use: No   Sexual activity: Not on file  Other Topics Concern   Not on file  Social History Narrative   Lives at home with husband - one daughter that lives in [place] (adopted) and one son (adopted)   Left handed   Caffeine: caffeine free drinks. 2 cup of coffee a day   Social Determinants of Health   Financial Resource Strain: High Risk (11/07/2022)   Overall Financial Resource Strain (CARDIA)    Difficulty of Paying Living Expenses: Very hard  Food Insecurity: No Food Insecurity (12/02/2020)   Hunger Vital Sign    Worried About Running Out of Food in the Last Year: Never true    Ran Out of Food in the Last Year: Never true  Transportation Needs: No Transportation Needs (11/07/2022)   PRAPARE - Administrator, Civil Service (Medical): No    Lack of Transportation (Non-Medical): No  Physical Activity: Not on file  Stress: Not on file  Social Connections: Not on file    Objective:  BP 110/60   Pulse 68   Temp (!) 95.9 F (35.5 C)   Resp 18   Ht 5\' 8"  (1.727 m)   Wt 169 lb 9.6 oz (76.9 kg)   BMI 25.79 kg/m      01/10/2023    9:33 AM 09/26/2022    9:23 AM 03/14/2022    2:24 PM  BP/Weight  Systolic BP 110 124 122  Diastolic BP 60 72 77  Wt. (Lbs) 169.6 163 178.13  BMI 25.79 kg/m2 25.53 kg/m2 27.08 kg/m2    Physical Exam Vitals reviewed.  Constitutional:      Appearance: Normal appearance. She is normal weight.  Neck:     Vascular: No carotid bruit.  Cardiovascular:     Rate and Rhythm: Normal  rate and regular rhythm.     Heart sounds: Normal heart sounds.  Pulmonary:     Effort: Pulmonary effort is normal. No respiratory distress.     Breath sounds: Normal breath sounds.  Abdominal:     General: Abdomen is flat. Bowel sounds are normal.     Palpations: Abdomen is soft.     Tenderness: There is no abdominal tenderness.  Neurological:     Mental Status: She is alert and oriented to person, place, and time.  Psychiatric:        Behavior: Behavior normal.     Comments: Anxious and depressed.       Diabetic Foot Exam - Simple   Simple Foot Form Diabetic Foot exam was performed with the following findings: Yes 01/10/2023  9:32 AM  Visual Inspection No deformities, no ulcerations, no other skin breakdown bilaterally: Yes Sensation Testing See comments: Yes Pulse Check Posterior Tibialis and Dorsalis pulse intact bilaterally: Yes Comments Pain and numbness in feet bilaterally.        Lab Results  Component Value Date   WBC 6.3 01/10/2023   HGB 12.6 01/10/2023   HCT 38.7  01/10/2023   PLT 343 01/10/2023   GLUCOSE 114 (H) 01/10/2023   CHOL 235 (H) 01/10/2023   TRIG 271 (H) 01/10/2023   HDL 73 01/10/2023   LDLCALC 116 (H) 01/10/2023   ALT 15 01/10/2023   AST 16 01/10/2023   NA 142 01/10/2023   K 4.6 01/10/2023   CL 107 (H) 01/10/2023   CREATININE 0.97 01/10/2023   BUN 18 01/10/2023   CO2 21 01/10/2023   TSH 2.730 09/26/2022   HGBA1C 6.0 (H) 01/10/2023   MICROALBUR 80 03/15/2021      Assessment & Plan:    Hypertensive heart disease with congestive heart failure, unspecified heart failure type [facility]) Assessment & Plan: Not quite at goal. Continue Toprol xl 25 mg twice daily.  Continue to work on eating a healthy diet and exercise.  Labs drawn today.    Orders: -     Comprehensive metabolic panel  Mixed hyperlipidemia Assessment & Plan: Await labs/testing for assessment and recommendations. Continue to work on eating a healthy diet and exercise.   Labs drawn today.    Orders: -     Lipid panel  Moderate Alzheimer's dementia of other onset with mood disturbance (HCC) Assessment & Plan: Continue aricept 10 mg before bed     Vitamin D insufficiency Assessment & Plan: The current medical regimen is effective;  continue present plan and medications. Continue vitamin d 50 K once weekly.   Acquired thrombophilia (HCC) Assessment & Plan: Secondary to xarelto for atrial fibrillation.    Diabetic polyneuropathy associated with type 2 diabetes mellitus (HCC) Assessment & Plan: Control: at goal Recommend check sugars fasting daily. Recommend check feet daily. Recommend annual eye exams. Medicines: Continue farxiga 10 mg daily and metformin 1000 mg twice daily. Continue to work on eating a healthy diet and exercise.  Labs drawn today.     Orders: -     CBC with Differential/Platelet -     Hemoglobin A1c  Acquired hypothyroidism Assessment & Plan: Previously well controlled Continue Synthroid at current dose  Recheck TSH and adjust Synthroid as indicated      Severe episode of recurrent major depressive disorder, without psychotic features (HCC) Assessment & Plan: Not at goal. Continue effexor xr 75 mg 3 in am. Continue zyprexa 5 mg one at night.      Primary insomnia Assessment & Plan: Start gabapentin 300 mg before bed.    Other orders -     Gabapentin; Take 1 capsule (300 mg total) by mouth at bedtime.  Dispense: 90 capsule; Refill: 0 -     Acyclovir; Take 1 tablet (400 mg total) by mouth 5 (five) times daily. Take 5 times daily for 7 days.  Dispense: 35 tablet; Refill: 3 -     Sertraline HCl; Take 1 tablet (50 mg total) by mouth daily.  Dispense: 90 tablet; Refill: 0     Meds ordered this encounter  Medications   gabapentin (NEURONTIN) 300 MG capsule    Sig: Take 1 capsule (300 mg total) by mouth at bedtime.    Dispense:  90 capsule    Refill:  0   acyclovir (ZOVIRAX) 400 MG tablet    Sig: Take  1 tablet (400 mg total) by mouth 5 (five) times daily. Take 5 times daily for 7 days.    Dispense:  35 tablet    Refill:  3   sertraline (ZOLOFT) 50 MG tablet    Sig: Take 1 tablet (50 mg total) by mouth daily.  Dispense:  90 tablet    Refill:  0    Orders Placed This Encounter  Procedures   CBC with Differential/Platelet   Comprehensive metabolic panel   Lipid panel   Hemoglobin A1c     Follow-up: Return in about 3 months (around 04/12/2023) for chronic, fasting, chronic fasting.   I,Katherina A Bramblett,acting as a scribe for Blane Ohara, MD.,have documented all relevant documentation on the behalf of Blane Ohara, MD,as directed by  Blane Ohara, MD while in the presence of Blane Ohara, MD.   Clayborn Bigness I Leal-Borjas,acting as a scribe for Blane Ohara, MD.,have documented all relevant documentation on the behalf of Blane Ohara, MD,as directed by  Blane Ohara, MD while in the presence of Blane Ohara, MD.    An After Visit Summary was printed and given to the patient.  Total time spent on today's visit was greater than 40 minutes, including both face-to-face time and nonface-to-face time personally spent on review of chart (labs and imaging), discussing labs and goals, discussing further work-up, treatment options, referrals to specialist if needed, reviewing outside records of pertinent, answering patient's questions, and coordinating care.  Blane Ohara, MD [facility] [phone]

## 2023-01-10 ENCOUNTER — Ambulatory Visit (INDEPENDENT_AMBULATORY_CARE_PROVIDER_SITE_OTHER): Payer: PPO | Admitting: Family Medicine

## 2023-01-10 ENCOUNTER — Telehealth: Payer: Self-pay | Admitting: Family Medicine

## 2023-01-10 VITALS — BP 110/60 | HR 68 | Temp 95.9°F | Resp 18 | Ht 68.0 in | Wt 169.6 lb

## 2023-01-10 DIAGNOSIS — E559 Vitamin D deficiency, unspecified: Secondary | ICD-10-CM | POA: Diagnosis not present

## 2023-01-10 DIAGNOSIS — I509 Heart failure, unspecified: Secondary | ICD-10-CM

## 2023-01-10 DIAGNOSIS — F5101 Primary insomnia: Secondary | ICD-10-CM

## 2023-01-10 DIAGNOSIS — E039 Hypothyroidism, unspecified: Secondary | ICD-10-CM

## 2023-01-10 DIAGNOSIS — I110 Hypertensive heart disease with heart failure: Secondary | ICD-10-CM

## 2023-01-10 DIAGNOSIS — E1142 Type 2 diabetes mellitus with diabetic polyneuropathy: Secondary | ICD-10-CM | POA: Diagnosis not present

## 2023-01-10 DIAGNOSIS — G308 Other Alzheimer's disease: Secondary | ICD-10-CM | POA: Diagnosis not present

## 2023-01-10 DIAGNOSIS — I4892 Unspecified atrial flutter: Secondary | ICD-10-CM

## 2023-01-10 DIAGNOSIS — F02B3 Dementia in other diseases classified elsewhere, moderate, with mood disturbance: Secondary | ICD-10-CM

## 2023-01-10 DIAGNOSIS — E782 Mixed hyperlipidemia: Secondary | ICD-10-CM

## 2023-01-10 DIAGNOSIS — F332 Major depressive disorder, recurrent severe without psychotic features: Secondary | ICD-10-CM | POA: Diagnosis not present

## 2023-01-10 DIAGNOSIS — D6869 Other thrombophilia: Secondary | ICD-10-CM

## 2023-01-10 MED ORDER — SERTRALINE HCL 50 MG PO TABS
50.0000 mg | ORAL_TABLET | Freq: Every day | ORAL | 0 refills | Status: DC
Start: 2023-01-10 — End: 2023-03-08

## 2023-01-10 MED ORDER — ACYCLOVIR 400 MG PO TABS: 400.0000 mg | ORAL_TABLET | Freq: Every day | ORAL | 3 refills | Status: DC

## 2023-01-10 MED ORDER — GABAPENTIN 300 MG PO CAPS
300.0000 mg | ORAL_CAPSULE | Freq: Every day | ORAL | 0 refills | Status: DC
Start: 2023-01-10 — End: 2023-03-12

## 2023-01-10 NOTE — Telephone Encounter (Signed)
Patient assistance application faxed to [employer] for Farxiga 10mg   Patient assistance application faxed to [facility] for Xarelto 20mg .   April Munoz  01/10/23 3:28 PM

## 2023-01-10 NOTE — Patient Instructions (Addendum)
Depression: Change effexor xr to zoloft 50 mg daily  Continue wellbutrin xl 150 mg once daily in am.  Continue zyprexa 5 mg before bed.   Neuropathy/Insomnia: Start gabapentin 300 mg before bed.   Falls/balance issues: Exercises recommended.

## 2023-01-11 ENCOUNTER — Telehealth: Payer: Self-pay

## 2023-01-11 LAB — HEMOGLOBIN A1C
Est. average glucose Bld gHb Est-mCnc: 126 mg/dL
Hgb A1c MFr Bld: 6 % — ABNORMAL HIGH (ref 4.8–5.6)

## 2023-01-11 LAB — COMPREHENSIVE METABOLIC PANEL
ALT: 15 IU/L (ref 0–32)
AST: 16 IU/L (ref 0–40)
Albumin: 4 g/dL (ref 3.8–4.8)
Alkaline Phosphatase: 76 IU/L (ref 44–121)
BUN/Creatinine Ratio: 19 (ref 12–28)
BUN: 18 mg/dL (ref 8–27)
Bilirubin Total: 0.4 mg/dL (ref 0.0–1.2)
CO2: 21 mmol/L (ref 20–29)
Calcium: 9.2 mg/dL (ref 8.7–10.3)
Chloride: 107 mmol/L — ABNORMAL HIGH (ref 96–106)
Creatinine, Ser: 0.97 mg/dL (ref 0.57–1.00)
Globulin, Total: 2.2 g/dL (ref 1.5–4.5)
Glucose: 114 mg/dL — ABNORMAL HIGH (ref 70–99)
Potassium: 4.6 mmol/L (ref 3.5–5.2)
Sodium: 142 mmol/L (ref 134–144)
Total Protein: 6.2 g/dL (ref 6.0–8.5)
eGFR: 62 mL/min/{1.73_m2} (ref >59)

## 2023-01-11 LAB — CBC WITH DIFFERENTIAL/PLATELET
Basophils Absolute: 0.1 10*3/uL (ref 0.0–0.2)
Basos: 1 %
EOS (ABSOLUTE): 0.3 10*3/uL (ref 0.0–0.4)
Eos: 4 %
Hematocrit: 38.7 % (ref 34.0–46.6)
Hemoglobin: 12.6 g/dL (ref 11.1–15.9)
Immature Grans (Abs): 0 10*3/uL (ref 0.0–0.1)
Immature Granulocytes: 0 %
Lymphocytes Absolute: 2.3 10*3/uL (ref 0.7–3.1)
Lymphs: 36 %
MCH: 31.8 pg (ref 26.6–33.0)
MCHC: 32.6 g/dL (ref 31.5–35.7)
MCV: 98 fL — ABNORMAL HIGH (ref 79–97)
Monocytes Absolute: 0.6 10*3/uL (ref 0.1–0.9)
Monocytes: 9 %
Neutrophils Absolute: 3.1 10*3/uL (ref 1.4–7.0)
Neutrophils: 50 %
Platelets: 343 10*3/uL (ref 150–450)
RBC: 3.96 x10E6/uL (ref 3.77–5.28)
RDW: 11.9 % (ref 11.7–15.4)
WBC: 6.3 10*3/uL (ref 3.4–10.8)

## 2023-01-11 LAB — LIPID PANEL
Chol/HDL Ratio: 3.2 ratio (ref 0.0–4.4)
Cholesterol, Total: 235 mg/dL — ABNORMAL HIGH (ref 100–199)
HDL: 73 mg/dL (ref >39)
LDL Chol Calc (NIH): 116 mg/dL — ABNORMAL HIGH (ref 0–99)
Triglycerides: 271 mg/dL — ABNORMAL HIGH (ref 0–149)
VLDL Cholesterol Cal: 46 mg/dL — ABNORMAL HIGH (ref 5–40)

## 2023-01-11 NOTE — Telephone Encounter (Signed)
Submitted application for FARXIGA to [employer] for patient assistance PROCESSING.   Phone: [phone]

## 2023-01-12 NOTE — Telephone Encounter (Signed)
Received notification from [employer] regarding approval for [facility] Patient assistance approved from 01/11/2023 to 07/17/2023.  Phone: [phone] Patient ID [id number]  PLEASE BE ADVISED AND SEND PT PRESCRIPTION TO [facility] FOR PROCESSING AND USE PT ID ABOVE.   Georga Bora [facility] Patient Advocate [phone]

## 2023-01-12 NOTE — Telephone Encounter (Addendum)
Patient assistance application for Xarelto was denied.

## 2023-01-13 NOTE — Assessment & Plan Note (Signed)
Start gabapentin 300 mg before bed.

## 2023-01-13 NOTE — Assessment & Plan Note (Signed)
The current medical regimen is effective;  continue present plan and medications. Continue vitamin d 50 K once weekly. 

## 2023-01-14 ENCOUNTER — Encounter: Payer: Self-pay | Admitting: Family Medicine

## 2023-01-15 ENCOUNTER — Other Ambulatory Visit: Payer: Self-pay

## 2023-01-15 ENCOUNTER — Other Ambulatory Visit: Payer: Self-pay | Admitting: Family Medicine

## 2023-01-15 ENCOUNTER — Other Ambulatory Visit: Payer: PPO | Admitting: Pharmacist

## 2023-01-15 MED ORDER — OMEGA-3-ACID ETHYL ESTERS 1 G PO CAPS: 2.0000 g | ORAL_CAPSULE | Freq: Two times a day (BID) | ORAL | 2 refills | Status: DC

## 2023-01-15 MED ORDER — DAPAGLIFLOZIN PROPANEDIOL 10 MG PO TABS: 10.0000 mg | ORAL_TABLET | Freq: Every day | ORAL | 3 refills | Status: DC

## 2023-01-15 MED ORDER — ROSUVASTATIN CALCIUM 5 MG PO TABS: 5.0000 mg | ORAL_TABLET | Freq: Every day | ORAL | 0 refills | Status: DC

## 2023-01-15 NOTE — Addendum Note (Signed)
Addended by: Nilda Simmer T on: 01/15/2023 09:57 AM   Modules accepted: Orders

## 2023-01-15 NOTE — Progress Notes (Signed)
Farxiga order pended. Please sign and send to [facility] for [facility] patient assistance program.   I will reach out to patient about Xarelto.   Catie Eppie Gibson, PharmD, BCACP, CPP Clinical Pharmacist [facility] [phone]

## 2023-01-15 NOTE — Addendum Note (Signed)
Addended byBlane Ohara on: 01/15/2023 11:54 AM   Modules accepted: Orders

## 2023-01-15 NOTE — Progress Notes (Signed)
Care Coordination Call  Received notification regarding patient assistance. Patient approved for Farxiga through [facility], was denied for Xarelto. This is not surprising as Associate [employer] for [employer] insured patients has been closed for several months.   Contacted patient, discussed Xarelto WithMe program that allows patients with high Xarelto copays to pay $89/30 day of $250/90 day for medication directly from manufacturer through mail order [facility] pharmacy. Patient will call and set this up. Will notify Dr. Elberta Fortis that prescription paperwork will be coming to his office via fax.   Collaborated with provider to send script for Marcelline Deist to [facility].   Catie Eppie Gibson, PharmD, BCACP, CPP Clinical Pharmacist [facility] [phone]

## 2023-01-26 ENCOUNTER — Other Ambulatory Visit: Payer: Self-pay | Admitting: Cardiology

## 2023-01-26 ENCOUNTER — Other Ambulatory Visit: Payer: Self-pay | Admitting: Family Medicine

## 2023-01-26 DIAGNOSIS — F332 Major depressive disorder, recurrent severe without psychotic features: Secondary | ICD-10-CM

## 2023-01-26 DIAGNOSIS — E559 Vitamin D deficiency, unspecified: Secondary | ICD-10-CM

## 2023-02-12 ENCOUNTER — Ambulatory Visit: Payer: PPO | Admitting: Cardiology

## 2023-02-27 ENCOUNTER — Ambulatory Visit: Payer: PPO

## 2023-02-27 LAB — HM DIABETES EYE EXAM

## 2023-02-28 ENCOUNTER — Ambulatory Visit (INDEPENDENT_AMBULATORY_CARE_PROVIDER_SITE_OTHER): Payer: PPO

## 2023-02-28 DIAGNOSIS — N959 Unspecified menopausal and perimenopausal disorder: Secondary | ICD-10-CM

## 2023-02-28 DIAGNOSIS — Z9189 Other specified personal risk factors, not elsewhere classified: Secondary | ICD-10-CM | POA: Diagnosis not present

## 2023-02-28 DIAGNOSIS — Z0000 Encounter for general adult medical examination without abnormal findings: Secondary | ICD-10-CM | POA: Diagnosis not present

## 2023-02-28 NOTE — Progress Notes (Signed)
Subjective:   April Munoz is a 72 y.o. female who presents for Medicare Annual (Subsequent) preventive examination.  This wellness visit is conducted by a nurse.  The patient's medications were reviewed and reconciled since the patient's last visit.  History details were provided by the patient.  The history appears to be reliable.    Medical History: Patient history and Family history was reviewed  Medications, Allergies, and preventative health maintenance was reviewed and updated.   Visit Complete: Virtual  I connected with  April Munoz on 02/28/23 by a audio enabled telemedicine application and verified that I am speaking with the correct person using two identifiers.  Patient Location: Home  Provider Location: Office/Clinic  I discussed the limitations of evaluation and management by telemedicine. The patient expressed understanding and agreed to proceed.  Patient voiced concerns that her depression has worsened recently.  She has noticed that she eats all of the time and has no desire to do anything.  Her husband passed away one year ago.  She has an appointment with Dr April Munoz next week to discuss.  Cardiac Risk Factors include: advanced age (>8men, >29 women);diabetes mellitus;dyslipidemia;hypertension     Objective:    Today's Vitals   02/28/23 1102  PainSc: 0-No pain   Vital Signs: Unable to obtain new vitals due to this being a telehealth visit.   There is no height or weight on file to calculate BMI.     10/27/2021    9:11 AM 12/14/2020    4:04 PM 11/26/2020    9:25 AM 12/11/2019    2:49 PM 07/30/2019   10:11 AM 03/21/2019    9:13 AM 11/06/2013    4:43 PM  Advanced Directives  Does Patient Have a Medical Advance Directive? Yes Yes Yes Yes Yes Yes Patient has advance directive, copy not in chart  Type of Advance Directive Healthcare Power of [place];Living will Healthcare Power of [place];Living will Healthcare Power of [place];Living will Healthcare Power of  [place];Living will Healthcare Power of [place];Living will Healthcare Power of [place];Living will Living will  Does patient want to make changes to medical advance directive? No - Patient declined No - Patient declined  No - Patient declined No - Patient declined  No change requested  Copy of Healthcare Power of Attorney in Chart?  No - copy requested No - copy requested No - copy requested No - copy requested      Current Medications (verified) Outpatient Encounter Medications as of 02/28/2023  Medication Sig   ACIDOPHILUS LACTOBACILLUS PO Take 1 capsule by mouth daily.   acyclovir (ZOVIRAX) 400 MG tablet Take 1 tablet (400 mg total) by mouth 5 (five) times daily. Take 5 times daily for 7 days.   B Complex-C-Folic Acid TABS Take 1 tablet by mouth daily.   buPROPion (WELLBUTRIN XL) 150 MG 24 hr tablet Take 1 tablet (150 mg total) by mouth every morning.   cyanocobalamin (VITAMIN B12) 1000 MCG/ML injection INJECT ONE ML INTRAMUSCULARLY EVERY 30 DAYS   dapagliflozin propanediol (FARXIGA) 10 MG TABS tablet Take 1 tablet (10 mg total) by mouth daily.   donepezil (ARICEPT) 10 MG tablet TAKE ONE TABLET BY MOUTH EVERYDAY AT BEDTIME   EPINEPHRINE 0.3 mg/0.3 mL IJ SOAJ injection INJECT 0.3 MG INTRAMUSCULARLY AS NEEDED FOR anaphylaxis   gabapentin (NEURONTIN) 300 MG capsule Take 1 capsule (300 mg total) by mouth at bedtime.   Melatonin 10 MG TABS Take by mouth.   metFORMIN (GLUCOPHAGE) 1000 MG tablet TAKE ONE TABLET BY MOUTH  EVERY MORNING and TAKE ONE TABLET BY MOUTH EVERYDAY AT BEDTIME Please call for fasting follow-up appt with Dr April Munoz.   metoprolol succinate (TOPROL-XL) 25 MG 24 hr tablet TAKE ONE TABLET BY MOUTH AT BREAKFAST AND AT BEDTIME   Multiple Vitamin (MULTIVITAMIN WITH MINERALS) TABS tablet Take 1 tablet by mouth daily.   OLANZapine (ZYPREXA) 5 MG tablet TAKE ONE TABLET BY MOUTH EVERYDAY AT BEDTIME   omega-3 acid ethyl esters (LOVAZA) 1 g capsule Take 2 capsules (2 g total) by mouth 2  (two) times daily.   rosuvastatin (CRESTOR) 5 MG tablet Take 1 tablet (5 mg total) by mouth daily.   sertraline (ZOLOFT) 50 MG tablet Take 1 tablet (50 mg total) by mouth daily.   solifenacin (VESICARE) 5 MG tablet TAKE 1 TABLET BY MOUTH EVERY DAY   SYNTHROID 25 MCG tablet TAKE ONE TABLET BY MOUTH BEFORE BREAKFAST daily   Syringe/Needle, Disp, (SYRINGE 3CC/27GX1-1/4") 27G X 1-1/4" 3 ML MISC 1 each by Does not apply route every 30 (thirty) days.   torsemide (DEMADEX) 20 MG tablet TAKE ONE TABLET BY MOUTH weekly on monday, wednesday, and friday   triamcinolone ointment (KENALOG) 0.1 % Apply topically 2 (two) times daily.   Vitamin D, Ergocalciferol, (DRISDOL) 1.25 MG (50000 UNIT) CAPS capsule TAKE ONE CAPSULE BY MOUTH ONCE WEEKLY ON MONDAY   XARELTO 20 MG TABS tablet TAKE ONE TABLET BY MOUTH EVERY MORNING   zinc gluconate 50 MG tablet Take 50 mg by mouth daily.   No facility-administered encounter medications on file as of 02/28/2023.    Allergies (verified) Atorvastatin   History: Past Medical History:  Diagnosis Date   Anxiety    Arthritis    Ataxic gait    Atrial fibrillation (HCC)    Atrophy of thyroid    B12 deficiency 10/31/2017   Cellulitis of abdominal wall 07/07/2016   Chronic anticoagulation 05/31/2018   Depression    Depression, major, recurrent, mild (HCC) 09/16/2019   Diabetes mellitus without complication (HCC)    Diabetic glomerulopathy (HCC) 09/16/2019   Esophageal stricture 06/05/2017   Essential hypertension 02/24/2013   Fatigue 01/20/2020   Fibromyalgia 02/24/2013   Full dentures    Hereditary and idiopathic peripheral neuropathy 02/24/2013   High risk medication use 11/07/2017   Hyperlipidemia 02/24/2013   Hypertension    Hypertensive heart disease 02/24/2013   Incontinence    Insomnia    Longstanding persistent atrial fibrillation (HCC) 09/16/2019   Mild vitamin D deficiency 09/16/2019   Mixed hyperlipidemia 02/24/2013   Murmur, cardiac 08/15/2015   Neuropathy in  diabetes (HCC) 10/31/2017   Osteoporosis    Other amnesia    Other amnesia    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    Paroxysmal atrial fibrillation (HCC) 08/15/2015   CHADS2vasc=3 CHADS2vasc=3   Primary insomnia    Restless legs syndrome 03/14/2013   Secondary hypothyroidism 09/16/2019   Thoracic or lumbosacral neuritis or radiculitis 02/24/2013   Type 2 diabetes mellitus, without long-term current use of insulin (HCC) 02/24/2013   Urge incontinence of urine 09/16/2019   Wears glasses    Past Surgical History:  Procedure Laterality Date   A-FLUTTER ABLATION N/A 07/30/2019   Procedure: A-FLUTTER ABLATION;  Surgeon: Regan Lemming, MD;  Location: [facility];  Service: Cardiovascular;  Laterality: N/A;   ATRIAL FIBRILLATION ABLATION N/A 03/21/2019   Procedure: ATRIAL FIBRILLATION ABLATION;  Surgeon: Regan Lemming, MD;  Location: [facility];  Service: Cardiovascular;  Laterality: N/A;  ATRIAL FIBRILLATION ABLATION N/A 10/27/2021   Procedure: ATRIAL FIBRILLATION ABLATION;  Surgeon: Regan Lemming, MD;  Location: [facility];  Service: Cardiovascular;  Laterality: N/A;   BREAST REDUCTION SURGERY  1983   BUBBLE STUDY  11/26/2020   Procedure: BUBBLE STUDY;  Surgeon: Chrystie Nose, MD;  Location[facility];  Service: Cardiovascular;;   CARDIOVERSION N/A 11/26/2020   Procedure: CARDIOVERSION;  Surgeon: Chrystie Nose, MD;  Location: [facility]OSCOPY;  Service: Cardiovascular;  Laterality: N/A;   CATARACT EXTRACTION, BILATERAL  03/23/17, 03/23/18   CHOLECYSTECTOMY  1971   open   DIAGNOSTIC LAPAROSCOPY  2010   lysis of adhesions   DILATION AND CURETTAGE OF UTERUS  2003/03/24   KNEE ARTHROSCOPY  1998   left   KNEE ARTHROSCOPY W/ LATERAL RETINACULAR REPAIR     MASS EXCISION Left 11/11/2013   Procedure: EXCISION MUCOID CYST LEFT INDEX FINGER/DEBRIDEMENT LEFT INDEX FINGER;  Surgeon: Nicki Reaper, MD;  Location: [facility];  Service:  Orthopedics;  Laterality: Left;  ANESTHESIA: IV REGIONAL/FAB   SHOULDER ARTHROSCOPY W/ ROTATOR CUFF REPAIR  March 23, 2006   left   TEE WITHOUT CARDIOVERSION N/A 11/26/2020   Procedure: TRANSESOPHAGEAL ECHOCARDIOGRAM (TEE);  Surgeon: Chrystie Nose, MD;  Location: [facility] ENDOSCOPY;  Service: Cardiovascular;  Laterality: N/A;   TRIGGER FINGER RELEASE Left 11/11/2013   Procedure: RELEASE A-1 PULLEY LEFT RING FINGER;  Surgeon: Nicki Reaper, MD;  Location: [facility];  Service: Orthopedics;  Laterality: Left;   UMBILICAL HERNIA REPAIR  2008, 2010   Family History  Problem Relation Age of Onset   Atrial fibrillation Mother    Heart attack Mother    Heart failure Father    Heart disease Brother    Heart attack Brother    Bladder Cancer Brother    Diabetes Maternal Grandmother    Heart failure Paternal Grandfather    Stroke Paternal Grandfather    Social History   Socioeconomic History   Marital status: Widowed    Spouse name: Raiford Noble   Number of children: 2   Years of education: 14   Highest education level: Some college, no degree  Occupational History   Occupation: retired  Tobacco Use   Smoking status: Never   Smokeless tobacco: Never  Vaping Use   Vaping status: Never Used  Substance and Sexual Activity   Alcohol use: Yes    Alcohol/week: 1.0 - 2.0 standard drink of alcohol    Types: 1 - 2 Glasses of wine per week    Comment: 4 times a month   Drug use: No   Sexual activity: Not Currently  Other Topics Concern   Not on file  Social History Narrative   Husband passed away 03-23-22 - one daughter that lives in [place] (adopted) and one son (adopted)   Left handed   Caffeine: caffeine free drinks. 2 cup of coffee a day   Social Determinants of Health   Financial Resource Strain: High Risk (02/28/2023)   Overall Financial Resource Strain (CARDIA)    Difficulty of Paying Living Expenses: Very hard  Food Insecurity: Food Insecurity Present (02/28/2023)   Hunger Vital Sign     Worried About Running Out of Food in the Last Year: Sometimes true    Ran Out of Food in the Last Year: Never true  Transportation Needs: No Transportation Needs (02/28/2023)   PRAPARE - Administrator, Civil Service (Medical): No    Lack of Transportation (Non-Medical): No  Physical  Activity: Inactive (02/28/2023)   Exercise Vital Sign    Days of Exercise per Week: 0 days    Minutes of Exercise per Session: 0 min  Stress: No Stress Concern Present (02/28/2023)   [employer] of Occupational Health - Occupational Stress Questionnaire    Feeling of Stress : Only a little  Social Connections: Not on file    Tobacco Counseling Counseling given: Not Answered   Clinical Intake:  Pre-visit preparation completed: Yes  Pain : No/denies pain Pain Score: 0-No pain     BMI - recorded: 25.79 Nutritional Status: BMI 25 -29 Overweight Nutritional Risks: None Diabetes: Yes (most recent A1C 6.0) CBG done?: No (patient reports 137 home reading this morning)  How often do you need to have someone help you when you read instructions, pamphlets, or other written materials from your doctor or pharmacy?: 1 - Never What is the last grade level you completed in school?: 2 years college  Interpreter Needed?: No      Activities of Daily Living    02/28/2023   12:30 PM  In your present state of health, do you have any difficulty performing the following activities:  Hearing? 1  Comment corrected with hearing aids  Vision? 0  Difficulty concentrating or making decisions? 1  Walking or climbing stairs? 1  Comment falls frequently  Dressing or bathing? 0  Doing errands, shopping? 0  Preparing Food and eating ? N  Using the Toilet? N  In the past six months, have you accidently leaked urine? N  Do you have problems with loss of bowel control? N  Managing your Medications? N  Managing your Finances? N  Housekeeping or managing your Housekeeping? N    Patient Care  Team: Blane Ohara, MD as PCP - General (Family Medicine) Baldo Daub, MD as PCP - Cardiology (Cardiology) Regan Lemming, MD as PCP - Electrophysiology (Cardiology) Baldo Daub, MD as Consulting Physician (Cardiology) Misenheimer, Marcial Pacas, MD as Consulting Physician (Unknown Physician Specialty) Darleene Cleaver, MD as Consulting Physician (General Surgery) Alden Hipp, RPH-CPP (Pharmacist)  Indicate any recent Medical Services you may have received from other than [facility] providers in the past year (date may be approximate).     Assessment:   This is a routine wellness examination for [place].  Hearing/Vision screen No results found.  Dietary issues and exercise activities discussed:     Goals Addressed             This Visit's Progress    Prevent falls       02/28/2023 AWV Goal: Fall Prevention  Over the next year, patient will decrease their risk for falls by: Using assistive devices, such as a cane or walker, as needed Identifying fall risks within their home and correcting them by: Removing throw rugs Adding handrails to stairs or ramps Removing clutter and keeping a clear pathway throughout the home Increasing light, especially at night Adding shower handles/bars Raising toilet seat Identifying potential personal risk factors for falls: Medication side effects Incontinence/urgency Vestibular dysfunction Hearing loss Musculoskeletal disorders Neurological disorders Orthostatic hypotension         Depression Screen    02/28/2023   11:05 AM 01/10/2023    9:17 AM 09/26/2022    9:24 AM 06/23/2022   10:06 AM 03/08/2022   11:12 AM 02/20/2022    4:10 PM 02/08/2022    8:44 AM  PHQ 2/9 Scores  PHQ - 2 Score 4 6 6 5 4 6 6   PHQ- 9  Score 19 19 22 14 13 22 22     Fall Risk    02/28/2023   12:29 PM 01/10/2023    9:17 AM 02/20/2022    4:10 PM 12/14/2020    4:05 PM 10/01/2020   11:19 AM  Fall Risk   Falls in the past year? 1 1 1 1 1   Number falls in  past yr: 1 1 1 1 1   Injury with Fall? 1 1 1  0 1  Risk for fall due to : History of fall(s) History of fall(s) History of fall(s);Impaired balance/gait Other (Comment);History of fall(s) Impaired balance/gait  Risk for fall due to: Comment    numbness in feet   Follow up Falls evaluation completed;Education provided Falls evaluation completed;Falls prevention discussed Falls evaluation completed;Education provided;Falls prevention discussed Falls evaluation completed;Education provided;Falls prevention discussed     MEDICARE RISK AT HOME:  Medicare Risk at Home - 02/28/23 1112     Any stairs in or around the home? Yes    If so, are there any without handrails? No    Home free of loose throw rugs in walkways, pet beds, electrical cords, etc? Yes    Adequate lighting in your home to reduce risk of falls? Yes    Life alert? No    Use of a cane, walker or w/c? No    Grab bars in the bathroom? Yes    Shower chair or bench in shower? Yes    Elevated toilet seat or a handicapped toilet? No             TIMED UP AND GO:  Was the test performed?  No    Cognitive Function:    02/20/2022    4:12 PM 02/08/2022    8:55 AM  MMSE - Mini Mental State Exam  Orientation to time 3 3  Orientation to Place 5 4  Registration 3 3  Attention/ Calculation 3 5  Recall 2 3  Language- name 2 objects 2 2  Language- repeat 1 1  Language- follow 3 step command 3 3  Language- read & follow direction 1 1  Write a sentence 1 1  Copy design 1 1  Total score 25 27        02/28/2023   12:32 PM 12/14/2020    4:09 PM 12/11/2019    2:54 PM  6CIT Screen  What Year? 0 points 0 points 0 points  What month? 0 points 0 points 0 points  What time? 0 points 0 points 0 points  Count back from 20 0 points 0 points 2 points  Months in reverse 2 points 2 points 2 points  Repeat phrase 2 points 4 points 6 points  Total Score 4 points 6 points 10 points    Immunizations Immunization History  Administered  Date(s) Administered   Fluad Quad(high Dose 65+) 03/18/2019, 04/25/2020, 04/16/2021   Influenza-Unspecified 03/18/2019, 05/14/2020   Moderna Sars-Covid-2 Vaccination 09/09/2019, 10/04/2019, 04/19/2020, 02/18/2021   Pneumococcal Conjugate-13 05/14/2015   Pneumococcal Polysaccharide-23 05/16/2013, 07/27/2017    TDAP status: Due, Education has been provided regarding the importance of this vaccine. Advised may receive this vaccine at local pharmacy or Health Dept. Aware to provide a copy of the vaccination record if obtained from local pharmacy or Health Dept. Verbalized acceptance and understanding.  Flu Vaccine status: Due, Education has been provided regarding the importance of this vaccine. Advised may receive this vaccine at local pharmacy or Health Dept. Aware to provide a copy of the vaccination record if obtained  from local pharmacy or Health Dept. Verbalized acceptance and understanding.  Pneumococcal vaccine status: Up to date  Covid-19 vaccine status: Information provided on how to obtain vaccines.   Qualifies for Shingles Vaccine? Yes   Zostavax completed No   Shingrix Completed?: No.    Education has been provided regarding the importance of this vaccine. Patient has been advised to call insurance company to determine out of pocket expense if they have not yet received this vaccine. Advised may also receive vaccine at local pharmacy or Health Dept. Verbalized acceptance and understanding.  Screening Tests Health Maintenance  Topic Date Due   Hepatitis C Screening  Never done   DTaP/Tdap/Td (1 - Tdap) Never done   Zoster Vaccines- Shingrix (1 of 2) Never done   COVID-19 Vaccine (5 - 2023-24 season) 03/17/2022   Diabetic kidney evaluation - Urine ACR  02/09/2023   Medicare Annual Wellness (AWV)  02/21/2023   INFLUENZA VACCINE  02/15/2023   MAMMOGRAM  04/02/2023   DEXA SCAN  04/02/2023   HEMOGLOBIN A1C  07/12/2023   Diabetic kidney evaluation - eGFR measurement  01/10/2024    FOOT EXAM  01/10/2024   OPHTHALMOLOGY EXAM  02/27/2024   Colonoscopy  01/12/2031   Pneumonia Vaccine 60+ Years old  Completed   HPV VACCINES  Aged Out    Health Maintenance  Health Maintenance Due  Topic Date Due   Hepatitis C Screening  Never done   DTaP/Tdap/Td (1 - Tdap) Never done   Zoster Vaccines- Shingrix (1 of 2) Never done   COVID-19 Vaccine (5 - 2023-24 season) 03/17/2022   Diabetic kidney evaluation - Urine ACR  02/09/2023   Medicare Annual Wellness (AWV)  02/21/2023   INFLUENZA VACCINE  02/15/2023    Colorectal cancer screening: Type of screening: Colonoscopy. Completed 01/11/2021.   Mammogram status: No longer required due to patient preference.  Bone Density status: Ordered  Lung Cancer Screening: (Low Dose CT Chest recommended if Age 94-80 years, 20 pack-year currently smoking OR have quit w/in 15years.) does not qualify.   Lung Cancer Screening Referral: N/A  Additional Screening:  Vision Screening: Recommended annual ophthalmology exams for early detection of glaucoma and other disorders of the eye. Is the patient up to date with their annual eye exam?  Yes   Dental Screening: Recommended annual dental exams for proper oral hygiene   Community Resource Referral / Chronic Care Management: CRR required this visit?  No   CCM required this visit?  No     Plan:    1- Discuss depression with Dr April Munoz next week at appointment.  We discussed grief counseling at Hospice. 2- Flu, Shingrix, Tetanus, and RSV vaccines recommended 3- DEXA ordered - last done 04/01/21 showing osteopenia.  Patient has frequent falls. 4- Retinopathy screening completed yesterday, resulted normal.  Patient made aware. 5- Patient declined further mammograms at this time.  I have personally reviewed and noted the following in the patient's chart:   Medical and social history Use of alcohol, tobacco or illicit drugs  Current medications and supplements including opioid prescriptions.   Functional ability and status Nutritional status Physical activity Advanced directives List of other physicians Hospitalizations, surgeries, and ER visits in previous 12 months Vitals Screenings to include cognitive, depression, and falls Referrals and appointments  In addition, I have reviewed and discussed with patient certain preventive protocols, quality metrics, and best practice recommendations. A written personalized care plan for preventive services as well as general preventive health recommendations were provided to patient.  Jacklynn Bue, LPN   9/56/2130   After Visit Summary: (MyChart) Due to this being a telephonic visit, the after visit summary with patients personalized plan was offered to patient via MyChart

## 2023-02-28 NOTE — Patient Instructions (Addendum)
April Munoz , Thank you for taking time to come for your Medicare Wellness Visit. I appreciate your ongoing commitment to your health goals. Please review the following plan we discussed and let me know if I can assist you in the future.    This is a list of the screening recommended for you and due dates:  Health Maintenance  Topic Date Due   Hepatitis C Screening  Never done   DTaP/Tdap/Td vaccine (1 - Tdap) Never done   Zoster (Shingles) Vaccine (1 of 2) Never done   COVID-19 Vaccine (5 - 2023-24 season) 03/17/2022   Yearly kidney health urinalysis for diabetes  02/09/2023   Flu Shot  02/15/2023   Mammogram  04/02/2023   DEXA scan (bone density measurement)  04/02/2023   Hemoglobin A1C  07/12/2023   Yearly kidney function blood test for diabetes  01/10/2024   Complete foot exam   01/10/2024   Eye exam for diabetics  02/27/2024   Medicare Annual Wellness Visit  02/28/2024   Colon Cancer Screening  01/12/2031   Pneumonia Vaccine  Completed   HPV Vaccine  Aged Out    - You can get your Tetanus, RSV, and Shingles vaccines at the pharmacy  - We will call you with your appointment time for your bone density scan   - You will follow up with Dr Sedalia Muta next Thursday about your feelings of depression.  If you feel you need help sooner please call our office.   Preventive Care 74 Years and Older, Female Preventive care refers to lifestyle choices and visits with your health care provider that can promote health and wellness. What does preventive care include? A yearly physical exam. This is also called an annual well check. Dental exams once or twice a year. Routine eye exams. Ask your health care provider how often you should have your eyes checked. Personal lifestyle choices, including: Daily care of your teeth and gums. Regular physical activity. Eating a healthy diet. Avoiding tobacco and drug use. Limiting alcohol use. Practicing safe sex. Taking low-dose aspirin every  day. Taking vitamin and mineral supplements as recommended by your health care provider. What happens during an annual well check? The services and screenings done by your health care provider during your annual well check will depend on your age, overall health, lifestyle risk factors, and family history of disease. Counseling  Your health care provider may ask you questions about your: Alcohol use. Tobacco use. Drug use. Emotional well-being. Home and relationship well-being. Sexual activity. Eating habits. History of falls. Memory and ability to understand (cognition). Work and work Astronomer. Reproductive health. Screening  You may have the following tests or measurements: Height, weight, and BMI. Blood pressure. Lipid and cholesterol levels. These may be checked every 5 years, or more frequently if you are over 42 years old. Skin check. Lung cancer screening. You may have this screening every year starting at age 44 if you have a 30-pack-year history of smoking and currently smoke or have quit within the past 15 years. Fecal occult blood test (FOBT) of the stool. You may have this test every year starting at age 51. Flexible sigmoidoscopy or colonoscopy. You may have a sigmoidoscopy every 5 years or a colonoscopy every 10 years starting at age 27. Hepatitis C blood test. Hepatitis B blood test. Sexually transmitted disease (STD) testing. Diabetes screening. This is done by checking your blood sugar (glucose) after you have not eaten for a while (fasting). You may have this done every  1-3 years. Bone density scan. This is done to screen for osteoporosis. You may have this done starting at age 84. Mammogram. This may be done every 1-2 years. Talk to your health care provider about how often you should have regular mammograms. Talk with your health care provider about your test results, treatment options, and if necessary, the need for more tests. Vaccines  Your health care  provider may recommend certain vaccines, such as: Influenza vaccine. This is recommended every year. Tetanus, diphtheria, and acellular pertussis (Tdap, Td) vaccine. You may need a Td booster every 10 years. Zoster vaccine. You may need this after age 58. Pneumococcal 13-valent conjugate (PCV13) vaccine. One dose is recommended after age 77. Pneumococcal polysaccharide (PPSV23) vaccine. One dose is recommended after age 23. Talk to your health care provider about which screenings and vaccines you need and how often you need them. This information is not intended to replace advice given to you by your health care provider. Make sure you discuss any questions you have with your health care provider. Document Released: 07/30/2015 Document Revised: 03/22/2016 Document Reviewed: 05/04/2015 Elsevier Interactive Patient Education  2017 [employer].  Fall Prevention in the Home Falls can cause injuries. They can happen to people of all ages. There are many things you can do to make your home safe and to help prevent falls. What can I do on the outside of my home? Regularly fix the edges of walkways and driveways and fix any cracks. Remove anything that might make you trip as you walk through a door, such as a raised step or threshold. Trim any bushes or trees on the path to your home. Use bright outdoor lighting. Clear any walking paths of anything that might make someone trip, such as rocks or tools. Regularly check to see if handrails are loose or broken. Make sure that both sides of any steps have handrails. Any raised decks and porches should have guardrails on the edges. Have any leaves, snow, or ice cleared regularly. Use sand or salt on walking paths during winter. Clean up any spills in your garage right away. This includes oil or grease spills. What can I do in the bathroom? Use night lights. Install grab bars by the toilet and in the tub and shower. Do not use towel bars as grab  bars. Use non-skid mats or decals in the tub or shower. If you need to sit down in the shower, use a plastic, non-slip stool. Keep the floor dry. Clean up any water that spills on the floor as soon as it happens. Remove soap buildup in the tub or shower regularly. Attach bath mats securely with double-sided non-slip rug tape. Do not have throw rugs and other things on the floor that can make you trip. What can I do in the bedroom? Use night lights. Make sure that you have a light by your bed that is easy to reach. Do not use any sheets or blankets that are too big for your bed. They should not hang down onto the floor. Have a firm chair that has side arms. You can use this for support while you get dressed. Do not have throw rugs and other things on the floor that can make you trip. What can I do in the kitchen? Clean up any spills right away. Avoid walking on wet floors. Keep items that you use a lot in easy-to-reach places. If you need to reach something above you, use a strong step stool that has a  grab bar. Keep electrical cords out of the way. Do not use floor polish or wax that makes floors slippery. If you must use wax, use non-skid floor wax. Do not have throw rugs and other things on the floor that can make you trip. What can I do with my stairs? Do not leave any items on the stairs. Make sure that there are handrails on both sides of the stairs and use them. Fix handrails that are broken or loose. Make sure that handrails are as long as the stairways. Check any carpeting to make sure that it is firmly attached to the stairs. Fix any carpet that is loose or worn. Avoid having throw rugs at the top or bottom of the stairs. If you do have throw rugs, attach them to the floor with carpet tape. Make sure that you have a light switch at the top of the stairs and the bottom of the stairs. If you do not have them, ask someone to add them for you. What else can I do to help prevent  falls? Wear shoes that: Do not have high heels. Have rubber bottoms. Are comfortable and fit you well. Are closed at the toe. Do not wear sandals. If you use a stepladder: Make sure that it is fully opened. Do not climb a closed stepladder. Make sure that both sides of the stepladder are locked into place. Ask someone to hold it for you, if possible. Clearly mark and make sure that you can see: Any grab bars or handrails. First and last steps. Where the edge of each step is. Use tools that help you move around (mobility aids) if they are needed. These include: Canes. Walkers. Scooters. Crutches. Turn on the lights when you go into a dark area. Replace any light bulbs as soon as they burn out. Set up your furniture so you have a clear path. Avoid moving your furniture around. If any of your floors are uneven, fix them. If there are any pets around you, be aware of where they are. Review your medicines with your doctor. Some medicines can make you feel dizzy. This can increase your chance of falling. Ask your doctor what other things that you can do to help prevent falls. This information is not intended to replace advice given to you by your health care provider. Make sure you discuss any questions you have with your health care provider. Document Released: 04/29/2009 Document Revised: 12/09/2015 Document Reviewed: 08/07/2014 Elsevier Interactive Patient Education  2017 [employer].

## 2023-03-08 ENCOUNTER — Encounter: Payer: Self-pay | Admitting: Family Medicine

## 2023-03-08 ENCOUNTER — Ambulatory Visit (INDEPENDENT_AMBULATORY_CARE_PROVIDER_SITE_OTHER): Payer: PPO | Admitting: Family Medicine

## 2023-03-08 VITALS — BP 112/70 | HR 78 | Temp 96.9°F | Ht 67.0 in | Wt 176.0 lb

## 2023-03-08 DIAGNOSIS — G609 Hereditary and idiopathic neuropathy, unspecified: Secondary | ICD-10-CM | POA: Diagnosis not present

## 2023-03-08 DIAGNOSIS — F314 Bipolar disorder, current episode depressed, severe, without psychotic features: Secondary | ICD-10-CM | POA: Diagnosis not present

## 2023-03-08 NOTE — Assessment & Plan Note (Signed)
-   Increase gabapentin to 600 mg nightly

## 2023-03-08 NOTE — Assessment & Plan Note (Signed)
Started on Vraylar 1.5 mg daily.  Stop Zyprexa. Follow-up in 1 month. If no improvement will refer to psychiatry.

## 2023-03-08 NOTE — Progress Notes (Signed)
Subjective:  Patient ID: April Munoz, female    DOB: 08/04/1950  Age: 72 y.o. MRN: [id number]  Chief Complaint  Patient presents with   Discuss Depression Medications    HPI   Patient presents today for depression. Wants to discuss changing medication.  Taking olanzeprine 5 mg qhs. Having difficulty getting to sleep at night. Continues to feel down and depressed since the death of her husband.  She does have someone staying with her at night which gives her comfort since she has had frequent falls.  Has taken wellbutrin xl, zoloft, duloxetine, Effexor XR, Trintellix, Xanax, Ativan.  Either she had side effects or they did not help.  Also wants to increase neurontin. At times will drink a glass of wine at night to help her to go sleep.     02/28/2023   11:05 AM 01/10/2023    9:17 AM 09/26/2022    9:24 AM 06/23/2022   10:06 AM 03/08/2022   11:12 AM  Depression screen PHQ 2/9  Decreased Interest 1 3 3 2 2   Down, Depressed, Hopeless 3 3 3 3 2   PHQ - 2 Score 4 6 6 5 4   Altered sleeping 2 3 3 3 3   Tired, decreased energy 3 3 3 3 1   Change in appetite 3 1 3  0 1  Feeling bad or failure about yourself  2 2 1 1 1   Trouble concentrating 3 2 3 1 2   Moving slowly or fidgety/restless 2 2 3 1 1   Suicidal thoughts 0 0 0 0 0  PHQ-9 Score 19 19 22 14 13   Difficult doing work/chores Very difficult Very difficult Very difficult Extremely dIfficult Extremely dIfficult        02/28/2023   12:29 PM  Fall Risk   Falls in the past year? 1  Number falls in past yr: 1  Injury with Fall? 1  Risk for fall due to : History of fall(s)  Follow up Falls evaluation completed;Education provided    Patient Care Team: Blane Ohara, MD as PCP - General (Family Medicine) Baldo Daub, MD as PCP - Cardiology (Cardiology) Regan Lemming, MD as PCP - Electrophysiology (Cardiology) Baldo Daub, MD as Consulting Physician (Cardiology) Misenheimer, Marcial Pacas, MD as Consulting Physician (Unknown  Physician Specialty) Darleene Cleaver, MD as Consulting Physician (General Surgery) Alden Hipp, RPH-CPP (Pharmacist)   Review of Systems  Constitutional:  Negative for chills, fatigue and fever.  Respiratory:  Negative for cough and shortness of breath.   Cardiovascular:  Negative for chest pain.  Gastrointestinal:  Negative for vomiting.  Neurological:  Negative for dizziness.  Psychiatric/Behavioral:  Positive for sleep disturbance. Negative for dysphoric mood. The patient is not nervous/anxious.     Current Outpatient Medications on File Prior to Visit  Medication Sig Dispense Refill   ACIDOPHILUS LACTOBACILLUS PO Take 1 capsule by mouth daily.     acyclovir (ZOVIRAX) 400 MG tablet Take 1 tablet (400 mg total) by mouth 5 (five) times daily. Take 5 times daily for 7 days. 35 tablet 3   B Complex-C-Folic Acid TABS Take 1 tablet by mouth daily.     cyanocobalamin (VITAMIN B12) 1000 MCG/ML injection INJECT ONE ML INTRAMUSCULARLY EVERY 30 DAYS 3 mL 2   dapagliflozin propanediol (FARXIGA) 10 MG TABS tablet Take 1 tablet (10 mg total) by mouth daily. 90 tablet 3   donepezil (ARICEPT) 10 MG tablet TAKE ONE TABLET BY MOUTH EVERYDAY AT BEDTIME 90 tablet 1   EPINEPHRINE 0.3 mg/0.3 mL  IJ SOAJ injection INJECT 0.3 MG INTRAMUSCULARLY AS NEEDED FOR anaphylaxis 2 each 1   gabapentin (NEURONTIN) 300 MG capsule Take 1 capsule (300 mg total) by mouth at bedtime. 90 capsule 0   Melatonin 10 MG TABS Take by mouth.     metFORMIN (GLUCOPHAGE) 1000 MG tablet TAKE ONE TABLET BY MOUTH EVERY MORNING and TAKE ONE TABLET BY MOUTH EVERYDAY AT BEDTIME Please call for fasting follow-up appt with Dr Sedalia Muta. 180 tablet 1   metoprolol succinate (TOPROL-XL) 25 MG 24 hr tablet TAKE ONE TABLET BY MOUTH AT BREAKFAST AND AT BEDTIME 60 tablet 0   Multiple Vitamin (MULTIVITAMIN WITH MINERALS) TABS tablet Take 1 tablet by mouth daily.     OLANZapine (ZYPREXA) 5 MG tablet TAKE ONE TABLET BY MOUTH EVERYDAY AT BEDTIME 90  tablet 0   omega-3 acid ethyl esters (LOVAZA) 1 g capsule Take 2 capsules (2 g total) by mouth 2 (two) times daily. 120 capsule 2   rosuvastatin (CRESTOR) 5 MG tablet Take 1 tablet (5 mg total) by mouth daily. 90 tablet 0   solifenacin (VESICARE) 5 MG tablet TAKE 1 TABLET BY MOUTH EVERY DAY 90 tablet 0   SYNTHROID 25 MCG tablet TAKE ONE TABLET BY MOUTH BEFORE BREAKFAST daily 90 tablet 0   Syringe/Needle, Disp, (SYRINGE 3CC/27GX1-1/4") 27G X 1-1/4" 3 ML MISC 1 each by Does not apply route every 30 (thirty) days. 100 each 0   torsemide (DEMADEX) 20 MG tablet TAKE ONE TABLET BY MOUTH weekly on monday, wednesday, and friday 90 tablet 0   triamcinolone ointment (KENALOG) 0.1 % Apply topically 2 (two) times daily.     Vitamin D, Ergocalciferol, (DRISDOL) 1.25 MG (50000 UNIT) CAPS capsule TAKE ONE CAPSULE BY MOUTH ONCE WEEKLY ON MONDAY 12 capsule 0   XARELTO 20 MG TABS tablet TAKE ONE TABLET BY MOUTH EVERY MORNING 90 tablet 1   zinc gluconate 50 MG tablet Take 50 mg by mouth daily.     No current facility-administered medications on file prior to visit.   Past Medical History:  Diagnosis Date   Anxiety    Arthritis    Ataxic gait    Atrial fibrillation (HCC)    Atrophy of thyroid    B12 deficiency 10/31/2017   Cellulitis of abdominal wall 07/07/2016   Chronic anticoagulation 05/31/2018   Depression    Depression, major, recurrent, mild (HCC) 09/16/2019   Diabetes mellitus without complication (HCC)    Diabetic glomerulopathy (HCC) 09/16/2019   Esophageal stricture 06/05/2017   Essential hypertension 02/24/2013   Fatigue 01/20/2020   Fibromyalgia 02/24/2013   Full dentures    Hereditary and idiopathic peripheral neuropathy 02/24/2013   High risk medication use 11/07/2017   Hyperlipidemia 02/24/2013   Hypertension    Hypertensive heart disease 02/24/2013   Incontinence    Insomnia    Longstanding persistent atrial fibrillation (HCC) 09/16/2019   Mild vitamin D deficiency 09/16/2019   Mixed  hyperlipidemia 02/24/2013   Murmur, cardiac 08/15/2015   Neuropathy in diabetes (HCC) 10/31/2017   Osteoporosis    Other amnesia    Other amnesia    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    Paroxysmal atrial fibrillation (HCC) 08/15/2015   CHADS2vasc=3 CHADS2vasc=3   Primary insomnia    Restless legs syndrome 03/14/2013   Secondary hypothyroidism 09/16/2019   Thoracic or lumbosacral neuritis or radiculitis 02/24/2013   Type 2 diabetes mellitus, without long-term current use of insulin (HCC) 02/24/2013   Urge incontinence of urine 09/16/2019   Wears  glasses    Past Surgical History:  Procedure Laterality Date   A-FLUTTER ABLATION N/A 07/30/2019   Procedure: A-FLUTTER ABLATION;  Surgeon: Regan Lemming, MD;  Location: [facility];  Service: Cardiovascular;  Laterality: N/A;   ATRIAL FIBRILLATION ABLATION N/A 03/21/2019   Procedure: ATRIAL FIBRILLATION ABLATION;  Surgeon: Regan Lemming, MD;  Location: [facility];  Service: Cardiovascular;  Laterality: N/A;   ATRIAL FIBRILLATION ABLATION N/A 10/27/2021   Procedure: ATRIAL FIBRILLATION ABLATION;  Surgeon: Regan Lemming, MD;  Location: [facility];  Service: Cardiovascular;  Laterality: N/A;   BREAST REDUCTION SURGERY  1983   BUBBLE STUDY  11/26/2020   Procedure: BUBBLE STUDY;  Surgeon: Chrystie Nose, MD;  Location: [facility];  Service: Cardiovascular;;   CARDIOVERSION N/A 11/26/2020   Procedure: CARDIOVERSION;  Surgeon: Chrystie Nose, MD;  Location: [facility] ENDOSCOPY;  Service: Cardiovascular;  Laterality: N/A;   CATARACT EXTRACTION, BILATERAL  04-04-17, 04-04-2018   CHOLECYSTECTOMY  1971   open   DIAGNOSTIC LAPAROSCOPY  2010   lysis of adhesions   DILATION AND CURETTAGE OF UTERUS  04/05/03   KNEE ARTHROSCOPY  1998   left   KNEE ARTHROSCOPY W/ LATERAL RETINACULAR REPAIR     MASS EXCISION Left 11/11/2013   Procedure: EXCISION MUCOID CYST LEFT INDEX FINGER/DEBRIDEMENT LEFT INDEX FINGER;   Surgeon: Nicki Reaper, MD;  Location: [facility];  Service: Orthopedics;  Laterality: Left;  ANESTHESIA: IV REGIONAL/FAB   SHOULDER ARTHROSCOPY W/ ROTATOR CUFF REPAIR  Apr 04, 2006   left   TEE WITHOUT CARDIOVERSION N/A 11/26/2020   Procedure: TRANSESOPHAGEAL ECHOCARDIOGRAM (TEE);  Surgeon: Chrystie Nose, MD;  Location: [facility]COPY;  Service: Cardiovascular;  Laterality: N/A;   TRIGGER FINGER RELEASE Left 11/11/2013   Procedure: RELEASE A-1 PULLEY LEFT RING FINGER;  Surgeon: Nicki Reaper, MD;  Location: [facility];  Service: Orthopedics;  Laterality: Left;   UMBILICAL HERNIA REPAIR  2008, 2010    Family History  Problem Relation Age of Onset   Atrial fibrillation Mother    Heart attack Mother    Heart failure Father    Heart disease Brother    Heart attack Brother    Bladder Cancer Brother    Diabetes Maternal Grandmother    Heart failure Paternal Grandfather    Stroke Paternal Grandfather    Social History   Socioeconomic History   Marital status: Widowed    Spouse name: Raiford Noble   Number of children: 2   Years of education: 14   Highest education level: Some college, no degree  Occupational History   Occupation: retired  Tobacco Use   Smoking status: Never   Smokeless tobacco: Never  Vaping Use   Vaping status: Never Used  Substance and Sexual Activity   Alcohol use: Yes    Alcohol/week: 1.0 - 2.0 standard drink of alcohol    Types: 1 - 2 Glasses of wine per week    Comment: 4 times a month   Drug use: No   Sexual activity: Not Currently  Other Topics Concern   Not on file  Social History Narrative   Husband passed away 04/04/22 - one daughter that lives in [place] (adopted) and one son (adopted)   Left handed   Caffeine: caffeine free drinks. 2 cup of coffee a day   Social Determinants of Health   Financial Resource Strain: High Risk (02/28/2023)   Overall Financial Resource Strain (CARDIA)    Difficulty of Paying Living  Expenses: Very hard  Food  Insecurity: Food Insecurity Present (02/28/2023)   Hunger Vital Sign    Worried About Running Out of Food in the Last Year: Sometimes true    Ran Out of Food in the Last Year: Never true  Transportation Needs: No Transportation Needs (02/28/2023)   PRAPARE - Administrator, Civil Service (Medical): No    Lack of Transportation (Non-Medical): No  Physical Activity: Inactive (02/28/2023)   Exercise Vital Sign    Days of Exercise per Week: 0 days    Minutes of Exercise per Session: 0 min  Stress: No Stress Concern Present (02/28/2023)   [employer] of Occupational Health - Occupational Stress Questionnaire    Feeling of Stress : Only a little  Social Connections: Socially Isolated (03/08/2023)   Social Connection and Isolation Panel [NHANES]    Frequency of Communication with Friends and Family: More than three times a week    Frequency of Social Gatherings with Friends and Family: More than three times a week    Attends Religious Services: Never    Database administrator or Organizations: No    Attends Banker Meetings: Not on file    Marital Status: Widowed    Objective:  BP 112/70   Pulse 78   Temp (!) 96.9 F (36.1 C)   Ht 5\' 7"  (1.702 m)   Wt 176 lb (79.8 kg)   SpO2 98%   BMI 27.57 kg/m      03/08/2023    1:51 PM 01/10/2023    9:33 AM 09/26/2022    9:23 AM  BP/Weight  Systolic BP 112 110 124  Diastolic BP 70 60 72  Wt. (Lbs) 176 169.6 163  BMI 27.57 kg/m2 25.79 kg/m2 25.53 kg/m2    Physical Exam Vitals reviewed.  Constitutional:      Appearance: Normal appearance.  Cardiovascular:     Rate and Rhythm: Normal rate and regular rhythm.     Heart sounds: Normal heart sounds.  Pulmonary:     Effort: Pulmonary effort is normal. No respiratory distress.     Breath sounds: Normal breath sounds.  Neurological:     Mental Status: She is alert and oriented to person, place, and time.  Psychiatric:        Behavior: Behavior normal.      Comments: Depressed affect    Diabetic Foot Exam - Simple   No data filed      Lab Results  Component Value Date   WBC 6.3 01/10/2023   HGB 12.6 01/10/2023   HCT 38.7 01/10/2023   PLT 343 01/10/2023   GLUCOSE 114 (H) 01/10/2023   CHOL 235 (H) 01/10/2023   TRIG 271 (H) 01/10/2023   HDL 73 01/10/2023   LDLCALC 116 (H) 01/10/2023   ALT 15 01/10/2023   AST 16 01/10/2023   NA 142 01/10/2023   K 4.6 01/10/2023   CL 107 (H) 01/10/2023   CREATININE 0.97 01/10/2023   BUN 18 01/10/2023   CO2 21 01/10/2023   TSH 2.730 09/26/2022   HGBA1C 6.0 (H) 01/10/2023   MICROALBUR 80 03/15/2021      Assessment & Plan:    Bipolar disorder with severe depression (HCC) Assessment & Plan: Started on Vraylar 1.5 mg daily.  Stop Zyprexa. Follow-up in 1 month. If no improvement will refer to psychiatry.   Hereditary and idiopathic peripheral neuropathy Assessment & Plan: Increase gabapentin to 600 mg nightly.     Total time spent on today's  visit was greater than 30 minutes, including both face-to-face time and nonface-to-face time personally spent on review of chart (labs and imaging), discussing labs and goals, discussing further work-up, treatment options, referrals to specialist if needed, reviewing outside records of pertinent, answering patient's questions, and coordinating care.  No orders of the defined types were placed in this encounter.   No orders of the defined types were placed in this encounter.    Follow-up: Return in about 4 weeks (around 04/05/2023) for chronic follow up.   I,Katherina A Bramblett,acting as a scribe for Blane Ohara, MD.,have documented all relevant documentation on the behalf of Blane Ohara, MD,as directed by  Blane Ohara, MD while in the presence of Blane Ohara, MD.   An After Visit Summary was printed and given to the patient.  Blane Ohara, MD [facility] [phone]

## 2023-03-08 NOTE — Patient Instructions (Signed)
Stop olanzapine (zyprexa.) Start on vraylar 1.5 mg one daily.   Increase gabapentin 600 mg before bed.

## 2023-03-09 ENCOUNTER — Other Ambulatory Visit: Payer: Self-pay | Admitting: Cardiology

## 2023-03-09 ENCOUNTER — Other Ambulatory Visit: Payer: Self-pay | Admitting: Family Medicine

## 2023-03-09 DIAGNOSIS — E1142 Type 2 diabetes mellitus with diabetic polyneuropathy: Secondary | ICD-10-CM

## 2023-03-09 DIAGNOSIS — F02B3 Dementia in other diseases classified elsewhere, moderate, with mood disturbance: Secondary | ICD-10-CM

## 2023-03-09 DIAGNOSIS — I4811 Longstanding persistent atrial fibrillation: Secondary | ICD-10-CM

## 2023-03-09 NOTE — Telephone Encounter (Signed)
Prescription refill request for Xarelto received.  Indication: Afib  Last office visit: 02/20/22 ([place])  Weight: 79.8kg Age: 72 Scr: 0.97 (01/10/23)  CrCl: 64.9ml/min  Office visit overdue. Pt has scheduled appt on 05/14/23. Refill sent.

## 2023-03-12 ENCOUNTER — Other Ambulatory Visit: Payer: Self-pay | Admitting: Family Medicine

## 2023-03-12 DIAGNOSIS — E559 Vitamin D deficiency, unspecified: Secondary | ICD-10-CM

## 2023-03-13 ENCOUNTER — Other Ambulatory Visit: Payer: Self-pay | Admitting: Cardiology

## 2023-03-26 ENCOUNTER — Other Ambulatory Visit: Payer: Self-pay | Admitting: Family Medicine

## 2023-03-28 ENCOUNTER — Other Ambulatory Visit: Payer: Self-pay | Admitting: Family Medicine

## 2023-03-28 ENCOUNTER — Telehealth: Payer: Self-pay

## 2023-03-28 MED ORDER — TRAZODONE HCL 50 MG PO TABS: 50.0000 mg | ORAL_TABLET | Freq: Every day | ORAL | 2 refills | Status: DC

## 2023-03-28 NOTE — Telephone Encounter (Signed)
Patient called stating that she is wanting to try trazadone for sleep. She states that she tried doubling her gabapentin like you had told her to do and it did help with her leg pain a lot but doesn't help her sleep. Patient is requesting if we can send rx for Trazadone to her local pharmacy and states that she does have a follow up appointment with you scheduled for this week. Please advise.

## 2023-03-29 NOTE — Telephone Encounter (Signed)
Left detailed message informing patient of response and told the patient if she has any questions or concerns to give us a call back. 

## 2023-04-05 ENCOUNTER — Ambulatory Visit: Payer: PPO | Admitting: Family Medicine

## 2023-04-09 ENCOUNTER — Telehealth: Payer: Self-pay | Admitting: Family Medicine

## 2023-04-09 ENCOUNTER — Other Ambulatory Visit: Payer: Self-pay

## 2023-04-09 DIAGNOSIS — Z9189 Other specified personal risk factors, not elsewhere classified: Secondary | ICD-10-CM

## 2023-04-09 NOTE — Telephone Encounter (Signed)
   April Munoz has been scheduled for the following appointment:  WHAT: BONE DENSITY WHERE: [facility]  DATE: 04/19/2023 TIME: 8:00 AM CHECK-IN  Patient has been made aware.

## 2023-04-27 ENCOUNTER — Telehealth: Payer: Self-pay | Admitting: Pharmacist

## 2023-04-27 ENCOUNTER — Other Ambulatory Visit: Payer: Self-pay | Admitting: Family Medicine

## 2023-04-27 NOTE — Progress Notes (Signed)
04/27/2023 Name: April Munoz MRN: [id number] DOB: 1950-07-26  Chief Complaint  Patient presents with   Quality Adherence Outreach    Attempted to outreach patient in response to quality metric reporting: adherence, rosuvastatin.  Left HIPAA compliant voicemail to return my call at their convenience.  Lynnda Shields, PharmD, BCPS Clinical Pharmacist [facility]

## 2023-04-30 ENCOUNTER — Other Ambulatory Visit: Payer: Self-pay | Admitting: Family Medicine

## 2023-05-01 NOTE — Progress Notes (Signed)
Cancelled. Dr. Cox  

## 2023-05-02 ENCOUNTER — Encounter (INDEPENDENT_AMBULATORY_CARE_PROVIDER_SITE_OTHER): Payer: PPO | Admitting: Family Medicine

## 2023-05-02 DIAGNOSIS — D6869 Other thrombophilia: Secondary | ICD-10-CM

## 2023-05-02 DIAGNOSIS — E559 Vitamin D deficiency, unspecified: Secondary | ICD-10-CM

## 2023-05-02 DIAGNOSIS — G609 Hereditary and idiopathic neuropathy, unspecified: Secondary | ICD-10-CM

## 2023-05-02 DIAGNOSIS — F02B3 Dementia in other diseases classified elsewhere, moderate, with mood disturbance: Secondary | ICD-10-CM

## 2023-05-02 DIAGNOSIS — I110 Hypertensive heart disease with heart failure: Secondary | ICD-10-CM

## 2023-05-02 DIAGNOSIS — F314 Bipolar disorder, current episode depressed, severe, without psychotic features: Secondary | ICD-10-CM

## 2023-05-02 DIAGNOSIS — E039 Hypothyroidism, unspecified: Secondary | ICD-10-CM

## 2023-05-02 DIAGNOSIS — E782 Mixed hyperlipidemia: Secondary | ICD-10-CM

## 2023-05-02 DIAGNOSIS — E1142 Type 2 diabetes mellitus with diabetic polyneuropathy: Secondary | ICD-10-CM

## 2023-05-02 DIAGNOSIS — F5101 Primary insomnia: Secondary | ICD-10-CM

## 2023-05-02 NOTE — Assessment & Plan Note (Signed)
Secondary to xarelto for atrial fibrillation.

## 2023-05-02 NOTE — Assessment & Plan Note (Signed)
Continue gabapentin to 600 mg nightly.

## 2023-05-02 NOTE — Assessment & Plan Note (Addendum)
Continue Crestor 5 mg daily Continue to work on eating a healthy diet and exercise.  Labs drawn today.

## 2023-05-02 NOTE — Assessment & Plan Note (Signed)
Previously well controlled Continue Synthroid at current dose  Recheck TSH and adjust Synthroid as indicated   

## 2023-05-02 NOTE — Assessment & Plan Note (Signed)
Continue aricept 10 mg before bed.  ?

## 2023-05-02 NOTE — Assessment & Plan Note (Signed)
Continue gabapentin.

## 2023-05-02 NOTE — Assessment & Plan Note (Signed)
Not quite at goal. Continue Toprol xl 25 mg twice daily.  Continue to work on eating a healthy diet and exercise.  Labs drawn toda

## 2023-05-02 NOTE — Assessment & Plan Note (Signed)
The current medical regimen is effective;  continue present plan and medications. Continue vitamin d 50 K once weekly.

## 2023-05-02 NOTE — Assessment & Plan Note (Signed)
Control: at goal Recommend check sugars fasting daily. Recommend check feet daily. Recommend annual eye exams. Medicines: Continue farxiga 10 mg daily and metformin 1000 mg twice daily. Continue to work on eating a healthy diet and exercise.  Labs drawn today.

## 2023-05-02 NOTE — Assessment & Plan Note (Signed)
Continue Vraylar 1.5mg  daily.

## 2023-05-14 ENCOUNTER — Ambulatory Visit: Payer: PPO | Admitting: Cardiology

## 2023-05-15 ENCOUNTER — Other Ambulatory Visit: Payer: Self-pay | Admitting: Family Medicine

## 2023-05-25 ENCOUNTER — Other Ambulatory Visit: Payer: Self-pay | Admitting: Cardiology

## 2023-05-25 ENCOUNTER — Other Ambulatory Visit: Payer: Self-pay | Admitting: Family Medicine

## 2023-06-07 ENCOUNTER — Telehealth: Payer: Self-pay

## 2023-06-07 ENCOUNTER — Other Ambulatory Visit: Payer: Self-pay | Admitting: Cardiology

## 2023-06-07 DIAGNOSIS — I4811 Longstanding persistent atrial fibrillation: Secondary | ICD-10-CM

## 2023-06-07 NOTE — Telephone Encounter (Signed)
PAP application for (FARXIGA WITH BI CARES)  has been mailed to pt home. I will fax PCP pages once I receive pt pages .

## 2023-06-08 NOTE — Telephone Encounter (Signed)
Pt last saw Dr Elberta Fortis 02/20/22, pt is overdue for 6 month follow-up. Pt has an upcoming appt scheduled for 08/13/23 with Dr Elberta Fortis.  Last labs 01/10/23 Creat 0.97, age 72, weight 79.8kg, CrCl 64.1, based on CrCl pt is on appropriate dosage of Xarelto 20mg  every day for afib.  Will refill rx to get pt to upcoming appt.

## 2023-06-12 DIAGNOSIS — Z13228 Encounter for screening for other metabolic disorders: Secondary | ICD-10-CM | POA: Diagnosis not present

## 2023-06-12 LAB — MICROALBUMIN / CREATININE URINE RATIO: Microalb Creat Ratio: 10

## 2023-06-26 ENCOUNTER — Encounter: Payer: Self-pay | Admitting: Family Medicine

## 2023-06-26 ENCOUNTER — Other Ambulatory Visit: Payer: Self-pay | Admitting: Family Medicine

## 2023-06-26 ENCOUNTER — Other Ambulatory Visit: Payer: Self-pay | Admitting: Cardiology

## 2023-06-29 ENCOUNTER — Encounter: Payer: Self-pay | Admitting: Family Medicine

## 2023-06-29 ENCOUNTER — Other Ambulatory Visit: Payer: Self-pay | Admitting: Family Medicine

## 2023-06-29 MED ORDER — CARIPRAZINE HCL 1.5 MG PO CAPS: 1.5000 mg | ORAL_CAPSULE | Freq: Every day | ORAL | 2 refills | Status: DC

## 2023-07-07 ENCOUNTER — Other Ambulatory Visit: Payer: Self-pay | Admitting: Cardiology

## 2023-07-09 NOTE — Telephone Encounter (Signed)
Prescription sent to pharmacy.

## 2023-07-10 NOTE — Progress Notes (Unsigned)
Subjective:  Patient ID: April Munoz, female    DOB: Dec 03, 1950  Age: 72 y.o. MRN: [id number]  Chief Complaint  Patient presents with   Medical Management of Chronic Issues    HPI The patient, with a history of diabetes, hypertension, and hypothyroidism, presents for a general check-up. She reports no outstanding health issues or pain. Her blood glucose levels are well-controlled, usually in the 120s, with Farxiga and metformin. She also takes metoprolol for hypertension. She has stopped taking her thyroid medication and reports feeling 'doggied out' most of the time. She also reports difficulty sleeping, often resorting to drinking wine to help her fall asleep. She has stopped taking her bladder medication and denies any urinary incontinence. She has also stopped taking omeprazole for acid reflux and denies any heartburn. She is not currently taking any supplements, including B12, but acknowledges she needs to start again. She reports tingling and numbness in her feet and hands. She is currently taking venlafaxine for mood regulation but questions its effectiveness. She reports feeling depressed, particularly over the past month, which she attributes to the recent loss of her spouse and the holiday season. She has previously seen a counselor but did not find it helpful.  DMII: Currently on farxiga 10 mg daily and metformin 1000 mg twice daily, utd with eye exam, checks feet daily. Check sugars about 4 times per week. 120-140s.  HTN: Metoprolol 25 mg twice daily   Thyroid: Patient has stopped taking Synthroid 25 mcg daily.  High Cholesterol: Patient is taking Rosuvastatin, but has stopped the Lovaza.  Atrial flutter: on xarelto 20 mg daily and metoprolol 25 mg twice daily.   Urge incontinence:Patient is no longer taking vesicare 5 mg daily. No incontinence.  GERD: no longer taking omeprazole 20 mg daily. Not having gerd.   B12 deficiency: Stopped taking OTC Vitamin B12. Ran out a while  ago.  Alzheimer's dementia: sees neurology. Currently on aricept 10 mg before bed.   Bipolar Disorder: Patient is only taking Venlafaxine and has stopped taking Zoloft and Vraylar.  Primary Insomnia: Patient states that she has tried taking the Trazodone and it does not help her sleep at night so she has stopped the medication.     07/12/2023    8:55 AM 02/28/2023   11:05 AM 01/10/2023    9:17 AM 09/26/2022    9:24 AM 06/23/2022   10:06 AM  Depression screen PHQ 2/9  Decreased Interest 1 1 3 3 2   Down, Depressed, Hopeless 2 3 3 3 3   PHQ - 2 Score 3 4 6 6 5   Altered sleeping 3 2 3 3 3   Tired, decreased energy 3 3 3 3 3   Change in appetite 0 3 1 3  0  Feeling bad or failure about yourself  0 2 2 1 1   Trouble concentrating 0 3 2 3 1   Moving slowly or fidgety/restless 1 2 2 3 1   Suicidal thoughts 0 0 0 0 0  PHQ-9 Score 10 19 19 22 14   Difficult doing work/chores Not difficult at all Very difficult Very difficult Very difficult Extremely dIfficult        07/12/2023    8:55 AM  Fall Risk   Falls in the past year? 1  Number falls in past yr: 0  Injury with Fall? 0  Risk for fall due to : Impaired balance/gait  Follow up Falls evaluation completed    Patient Care Team: Blane Ohara, MD as PCP - General (Family Medicine) Baldo Daub,  MD as PCP - Cardiology (Cardiology) Regan Lemming, MD as PCP - Electrophysiology (Cardiology) Baldo Daub, MD as Consulting Physician (Cardiology) Misenheimer, Marcial Pacas, MD as Consulting Physician (Unknown Physician Specialty) Darleene Cleaver, MD as Consulting Physician (General Surgery) Gabriel Carina, [facility] (Inactive) (Pharmacist)   Review of Systems  Constitutional:  Negative for appetite change, fatigue and fever.  HENT:  Positive for congestion. Negative for ear pain, sinus pressure and sore throat.   Respiratory:  Negative for cough, chest tightness, shortness of breath and wheezing.   Cardiovascular:  Negative for chest pain and  palpitations.  Gastrointestinal:  Negative for abdominal pain, constipation, diarrhea, nausea and vomiting.  Genitourinary:  Negative for dysuria and hematuria.  Musculoskeletal:  Negative for arthralgias, back pain, joint swelling and myalgias.  Skin:  Negative for rash.  Neurological:  Negative for dizziness, weakness and headaches.  Psychiatric/Behavioral:  Positive for sleep disturbance (Not sleeping). Negative for dysphoric mood. The patient is not nervous/anxious.     Current Outpatient Medications on File Prior to Visit  Medication Sig Dispense Refill   B-D 3CC LUER-LOK SYR 25GX1" 25G X 1" 3 ML MISC USE WITH B12 INJECTIONS 3 each 10   dapagliflozin propanediol (FARXIGA) 10 MG TABS tablet TAKE 1 TABLET BY MOUTH ONCE DAILY *REFILL REQUEST* 30 tablet 10   donepezil (ARICEPT) 10 MG tablet TAKE 1 TABLET BY MOUTH EVERY DAY AT BEDTIME *REFILL REQUEST* 30 tablet 10   EPINEPHRINE 0.3 mg/0.3 mL IJ SOAJ injection INJECT 0.3 MG INTRAMUSCULARLY AS NEEDED FOR anaphylaxis 2 each 1   gabapentin (NEURONTIN) 300 MG capsule TAKE 1 CAPSULE BY MOUTH EVERY DAY AT BEDTIME *REFILL REQUEST* 30 capsule 10   Melatonin 10 MG TABS Take by mouth.     metFORMIN (GLUCOPHAGE) 1000 MG tablet TAKE 1 TABLET BY MOUTH EVERY MORNING AND 1 TABLET EVERY DAY AT BEDTIME *PATIENT NEEDS APPOINTMENT* *REFILL REQUEST* 60 tablet 10   metoprolol succinate (TOPROL-XL) 25 MG 24 hr tablet Take 1 tablet (25 mg total) by mouth 2 (two) times daily. Patient needs an appointment for further refills. 2 nd attempt 30 tablet 0   rivaroxaban (XARELTO) 20 MG TABS tablet TAKE 1 TABLET BY MOUTH ONCE DAILY WITH SUPPER 30 tablet 2   rosuvastatin (CRESTOR) 5 MG tablet TAKE 1 TABLET BY MOUTH ONCE DAILY 90 tablet 1   venlafaxine XR (EFFEXOR-XR) 75 MG 24 hr capsule TAKE 3 CAPSULES BY MOUTH EACH MORNING 90 capsule 1   Syringe/Needle, Disp, (SYRINGE 3CC/27GX1-1/4") 27G X 1-1/4" 3 ML MISC 1 each by Does not apply route every 30 (thirty) days. 100 each 0    traZODone (DESYREL) 50 MG tablet Take 1 tablet (50 mg total) by mouth at bedtime. (Patient not taking: Reported on 07/12/2023) 30 tablet 2   No current facility-administered medications on file prior to visit.   Past Medical History:  Diagnosis Date   Anxiety    Arthritis    Ataxic gait    Atrial fibrillation (HCC)    Atrophy of thyroid    B12 deficiency 10/31/2017   Cellulitis of abdominal wall 07/07/2016   Chronic anticoagulation 05/31/2018   Depression    Depression, major, recurrent, mild (HCC) 09/16/2019   Diabetes mellitus without complication (HCC)    Diabetic glomerulopathy (HCC) 09/16/2019   Esophageal stricture 06/05/2017   Essential hypertension 02/24/2013   Fatigue 01/20/2020   Fibromyalgia 02/24/2013   Full dentures    Hereditary and idiopathic peripheral neuropathy 02/24/2013   High risk medication use 11/07/2017   Hyperlipidemia  02/24/2013   Hypertension    Hypertensive heart disease 02/24/2013   Incontinence    Insomnia    Longstanding persistent atrial fibrillation (HCC) 09/16/2019   Mild vitamin D deficiency 09/16/2019   Mixed hyperlipidemia 02/24/2013   Murmur, cardiac 08/15/2015   Neuropathy in diabetes (HCC) 10/31/2017   Osteoporosis    Other amnesia    Other amnesia    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    Paroxysmal atrial fibrillation (HCC) 08/15/2015   CHADS2vasc=3 CHADS2vasc=3   Primary insomnia    Restless legs syndrome 03/14/2013   Secondary hypothyroidism 09/16/2019   Thoracic or lumbosacral neuritis or radiculitis 02/24/2013   Type 2 diabetes mellitus, without long-term current use of insulin (HCC) 02/24/2013   Urge incontinence of urine 09/16/2019   Wears glasses    Past Surgical History:  Procedure Laterality Date   A-FLUTTER ABLATION N/A 07/30/2019   Procedure: A-FLUTTER ABLATION;  Surgeon: Regan Lemming, MD;  Location: [facility];  Service: Cardiovascular;  Laterality: N/A;   ATRIAL FIBRILLATION ABLATION N/A  03/21/2019   Procedure: ATRIAL FIBRILLATION ABLATION;  Surgeon: Regan Lemming, MD;  Location: [facility];  Service: Cardiovascular;  Laterality: N/A;   ATRIAL FIBRILLATION ABLATION N/A 10/27/2021   Procedure: ATRIAL FIBRILLATION ABLATION;  Surgeon: Regan Lemming, MD;  Location: [facility];  Service: Cardiovascular;  Laterality: N/A;   BREAST REDUCTION SURGERY  1983   BUBBLE STUDY  11/26/2020   Procedure: BUBBLE STUDY;  Surgeon: Chrystie Nose, MD;  Location: [facility] ENDOSCOPY;  Service: Cardiovascular;;   CARDIOVERSION N/A 11/26/2020   Procedure: CARDIOVERSION;  Surgeon: Chrystie Nose, MD;  Location: [facility] ENDOSCOPY;  Service: Cardiovascular;  Laterality: N/A;   CATARACT EXTRACTION, BILATERAL  2018, 2019   CHOLECYSTECTOMY  1971   open   DIAGNOSTIC LAPAROSCOPY  2010   lysis of adhesions   DILATION AND CURETTAGE OF UTERUS  2004   KNEE ARTHROSCOPY  1998   left   KNEE ARTHROSCOPY W/ LATERAL RETINACULAR REPAIR     MASS EXCISION Left 11/11/2013   Procedure: EXCISION MUCOID CYST LEFT INDEX FINGER/DEBRIDEMENT LEFT INDEX FINGER;  Surgeon: Nicki Reaper, MD;  Location: [facility];  Service: Orthopedics;  Laterality: Left;  ANESTHESIA: IV REGIONAL/FAB   SHOULDER ARTHROSCOPY W/ ROTATOR CUFF REPAIR  2007   left   TEE WITHOUT CARDIOVERSION N/A 11/26/2020   Procedure: TRANSESOPHAGEAL ECHOCARDIOGRAM (TEE);  Surgeon: Chrystie Nose, MD;  Location: [facility] ENDOSCOPY;  Service: Cardiovascular;  Laterality: N/A;   TRIGGER FINGER RELEASE Left 11/11/2013   Procedure: RELEASE A-1 PULLEY LEFT RING FINGER;  Surgeon: Nicki Reaper, MD;  Location: [facility];  Service: Orthopedics;  Laterality: Left;   UMBILICAL HERNIA REPAIR  2008, 2010    Family History  Problem Relation Age of Onset   Atrial fibrillation Mother    Heart attack Mother    Heart failure Father    Heart disease Brother    Heart attack Brother    Bladder Cancer Brother    Diabetes Maternal  Grandmother    Heart failure Paternal Grandfather    Stroke Paternal Grandfather    Social History   Socioeconomic History   Marital status: Widowed    Spouse name: Raiford Noble   Number of children: 2   Years of education: 14   Highest education level: Some college, no degree  Occupational History   Occupation: retired  Tobacco Use   Smoking status: Never   Smokeless tobacco:  Never  Vaping Use   Vaping status: Never Used  Substance and Sexual Activity   Alcohol use: Yes    Alcohol/week: 1.0 - 2.0 standard drink of alcohol    Types: 1 - 2 Glasses of wine per week    Comment: 4 times a month   Drug use: No   Sexual activity: Not Currently  Other Topics Concern   Not on file  Social History Narrative   Husband passed away 08/08/2022 - one daughter that lives in [place] (adopted) and one son (adopted)   Left handed   Caffeine: caffeine free drinks. 2 cup of coffee a day   Social Drivers of Corporate investment banker Strain: High Risk (02/28/2023)   Overall Financial Resource Strain (CARDIA)    Difficulty of Paying Living Expenses: Very hard  Food Insecurity: Food Insecurity Present (02/28/2023)   Hunger Vital Sign    Worried About Running Out of Food in the Last Year: Sometimes true    Ran Out of Food in the Last Year: Never true  Transportation Needs: No Transportation Needs (02/28/2023)   PRAPARE - Administrator, Civil Service (Medical): No    Lack of Transportation (Non-Medical): No  Physical Activity: Inactive (02/28/2023)   Exercise Vital Sign    Days of Exercise per Week: 0 days    Minutes of Exercise per Session: 0 min  Stress: No Stress Concern Present (02/28/2023)   [employer] of Occupational Health - Occupational Stress Questionnaire    Feeling of Stress : Only a little  Social Connections: Socially Isolated (03/08/2023)   Social Connection and Isolation Panel [NHANES]    Frequency of Communication with Friends and Family: More than three times a week     Frequency of Social Gatherings with Friends and Family: More than three times a week    Attends Religious Services: Never    Database administrator or Organizations: No    Attends Banker Meetings: Not on file    Marital Status: Widowed    Objective:  BP (!) 150/70   Pulse 92   Temp (!) 97.4 F (36.3 C) (Temporal)   Ht 5\' 7"  (1.702 m)   Wt 183 lb (83 kg)   SpO2 95%   BMI 28.66 kg/m      07/12/2023    9:29 AM 07/12/2023    8:48 AM 03/08/2023    1:51 PM  BP/Weight  Systolic BP 150 148 112  Diastolic BP 70 92 70  Wt. (Lbs)  183 176  BMI  28.66 kg/m2 27.57 kg/m2    Physical Exam Vitals reviewed.  Constitutional:      Appearance: Normal appearance.  Neck:     Vascular: No carotid bruit.  Cardiovascular:     Rate and Rhythm: Normal rate and regular rhythm.     Heart sounds: Normal heart sounds.  Pulmonary:     Effort: Pulmonary effort is normal. No respiratory distress.     Breath sounds: Normal breath sounds.  Abdominal:     General: Abdomen is flat. Bowel sounds are normal.     Palpations: Abdomen is soft.     Tenderness: There is no abdominal tenderness.  Neurological:     Mental Status: She is alert and oriented to person, place, and time.  Psychiatric:        Mood and Affect: Mood normal.        Behavior: Behavior normal.     Diabetic Foot Exam - Simple  Simple Foot Form  07/12/2023  9:20 AM  Visual Inspection No deformities, no ulcerations, no other skin breakdown bilaterally: Yes Sensation Testing Intact to touch and monofilament testing bilaterally: Yes Pulse Check Posterior Tibialis and Dorsalis pulse intact bilaterally: Yes Comments      Lab Results  Component Value Date   WBC 6.3 01/10/2023   HGB 12.6 01/10/2023   HCT 38.7 01/10/2023   PLT 343 01/10/2023   GLUCOSE 114 (H) 01/10/2023   CHOL 235 (H) 01/10/2023   TRIG 271 (H) 01/10/2023   HDL 73 01/10/2023   LDLCALC 116 (H) 01/10/2023   ALT 15 01/10/2023   AST 16 01/10/2023    NA 142 01/10/2023   K 4.6 01/10/2023   CL 107 (H) 01/10/2023   CREATININE 0.97 01/10/2023   BUN 18 01/10/2023   CO2 21 01/10/2023   TSH 2.730 09/26/2022   HGBA1C 6.0 (H) 01/10/2023   MICROALBUR 80 03/15/2021      Assessment & Plan:    Hypertensive heart disease with congestive heart failure, unspecified heart failure type [facility]) Assessment & Plan: Not quite at goal. Continue Toprol xl 25 mg twice daily.  Start on valsartan 80 mg daily.  Continue to work on eating a healthy diet and exercise.  Labs drawn toda  Orders: -     Valsartan; Take 1 tablet (80 mg total) by mouth daily.  Dispense: 90 tablet; Refill: 0  Diabetic polyneuropathy associated with type 2 diabetes mellitus (HCC) Assessment & Plan: Control: at goal Recommend check sugars fasting daily. Recommend check feet daily. Recommend annual eye exams. Medicines: Continue farxiga 10 mg daily and metformin 1000 mg twice daily. Continue to work on eating a healthy diet and exercise.  Labs drawn today.    Orders: -     Hemoglobin A1c  Mixed hyperlipidemia Assessment & Plan: Continue Crestor 5 mg daily Continue to work on eating a healthy diet and exercise.  Labs drawn today.     Orders: -     CBC with Differential/Platelet -     Comprehensive metabolic panel -     Lipid panel  Acquired hypothyroidism Assessment & Plan: Patient stopped synthroid. Recheck TSH and free T4.        Orders: -     T4, free -     TSH  Primary insomnia Assessment & Plan: Order rozerem 8 mg before bed.   Orders: -     Ramelteon; Take 1 tablet (8 mg total) by mouth at bedtime.  Dispense: 30 tablet; Refill: 2  B12 deficiency Assessment & Plan: Check b12 level.    Orders: -     Vitamin B12 -     Methylmalonic acid, serum  Need for hepatitis C screening test -     Hepatitis C antibody  Visit for screening mammogram -     Digital Screening Mammogram, Left and Right; Future  Encounter for osteoporosis screening  in asymptomatic postmenopausal patient -     DG Bone Density; Future  Encounter for immunization -     Flu Vaccine Trivalent High Dose (Fluad) -     Pfizer Comirnaty Covid-19 Vaccine 70yrs & older    Meds ordered this encounter  Medications   ramelteon (ROZEREM) 8 MG tablet    Sig: Take 1 tablet (8 mg total) by mouth at bedtime.    Dispense:  30 tablet    Refill:  2   valsartan (DIOVAN) 80 MG tablet    Sig: Take 1 tablet (80 mg total) by  mouth daily.    Dispense:  90 tablet    Refill:  0    Orders Placed This Encounter  Procedures   DG Bone Density   MM DIGITAL SCREENING BILATERAL   Flu Vaccine Trivalent High Dose (Fluad)   Pfizer Comirnaty Covid-19 Vaccine 53yrs & older   CBC with Differential/Platelet   Comprehensive metabolic panel   Hemoglobin A1c   Lipid panel   Vitamin B12   Methylmalonic acid, serum   T4, free   TSH   Hepatitis C antibody     Follow-up: Return in about 3 months (around 10/10/2023) for chronic follow up. also needs follow up with Lajuana Matte, NP for hypertension follow up. Clayborn Bigness I Leal-Borjas,acting as a scribe for Blane Ohara, MD.,have documented all relevant documentation on the behalf of Blane Ohara, MD,as directed by  Blane Ohara, MD while in the presence of Blane Ohara, MD.   An After Visit Summary was printed and given to the patient.  Blane Ohara, MD [facility] [phone]

## 2023-07-12 ENCOUNTER — Ambulatory Visit: Payer: PPO | Admitting: Family Medicine

## 2023-07-12 ENCOUNTER — Encounter: Payer: Self-pay | Admitting: Family Medicine

## 2023-07-12 VITALS — BP 150/70 | HR 92 | Temp 97.4°F | Ht 67.0 in | Wt 183.0 lb

## 2023-07-12 DIAGNOSIS — F319 Bipolar disorder, unspecified: Secondary | ICD-10-CM

## 2023-07-12 DIAGNOSIS — Z1159 Encounter for screening for other viral diseases: Secondary | ICD-10-CM

## 2023-07-12 DIAGNOSIS — F5101 Primary insomnia: Secondary | ICD-10-CM

## 2023-07-12 DIAGNOSIS — E039 Hypothyroidism, unspecified: Secondary | ICD-10-CM | POA: Diagnosis not present

## 2023-07-12 DIAGNOSIS — E1142 Type 2 diabetes mellitus with diabetic polyneuropathy: Secondary | ICD-10-CM

## 2023-07-12 DIAGNOSIS — Z23 Encounter for immunization: Secondary | ICD-10-CM | POA: Diagnosis not present

## 2023-07-12 DIAGNOSIS — G309 Alzheimer's disease, unspecified: Secondary | ICD-10-CM | POA: Diagnosis not present

## 2023-07-12 DIAGNOSIS — E538 Deficiency of other specified B group vitamins: Secondary | ICD-10-CM | POA: Diagnosis not present

## 2023-07-12 DIAGNOSIS — I4892 Unspecified atrial flutter: Secondary | ICD-10-CM | POA: Diagnosis not present

## 2023-07-12 DIAGNOSIS — F0280 Dementia in other diseases classified elsewhere without behavioral disturbance: Secondary | ICD-10-CM

## 2023-07-12 DIAGNOSIS — E782 Mixed hyperlipidemia: Secondary | ICD-10-CM | POA: Diagnosis not present

## 2023-07-12 DIAGNOSIS — Z13820 Encounter for screening for osteoporosis: Secondary | ICD-10-CM

## 2023-07-12 DIAGNOSIS — I110 Hypertensive heart disease with heart failure: Secondary | ICD-10-CM | POA: Diagnosis not present

## 2023-07-12 DIAGNOSIS — Z1231 Encounter for screening mammogram for malignant neoplasm of breast: Secondary | ICD-10-CM

## 2023-07-12 MED ORDER — VALSARTAN 80 MG PO TABS: 80.0000 mg | ORAL_TABLET | Freq: Every day | ORAL | 0 refills | Status: DC

## 2023-07-12 MED ORDER — RAMELTEON 8 MG PO TABS: 8.0000 mg | ORAL_TABLET | Freq: Every day | ORAL | 2 refills | Status: DC

## 2023-07-12 NOTE — Assessment & Plan Note (Signed)
Patient stopped synthroid. Recheck TSH and free T4.

## 2023-07-12 NOTE — Assessment & Plan Note (Signed)
Not quite at goal. Continue Toprol xl 25 mg twice daily.  Start on valsartan 80 mg daily.  Continue to work on eating a healthy diet and exercise.  Labs drawn toda

## 2023-07-12 NOTE — Assessment & Plan Note (Signed)
Control: at goal Recommend check sugars fasting daily. Recommend check feet daily. Recommend annual eye exams. Medicines: Continue farxiga 10 mg daily and metformin 1000 mg twice daily. Continue to work on eating a healthy diet and exercise.  Labs drawn today.    

## 2023-07-12 NOTE — Assessment & Plan Note (Signed)
Check b12 level  

## 2023-07-12 NOTE — Patient Instructions (Addendum)
Insomnia: Start Rozerem 8 mg 1 p.o. nightly.  Recommend tetanus vaccine and shingles vaccines at the pharmacy.  Hypertension: start on valsartan 80 mg daily.

## 2023-07-12 NOTE — Assessment & Plan Note (Signed)
Order rozerem 8 mg before bed.

## 2023-07-12 NOTE — Assessment & Plan Note (Signed)
Continue Crestor 5 mg daily Continue to work on eating a healthy diet and exercise.  Labs drawn today.

## 2023-07-13 ENCOUNTER — Other Ambulatory Visit: Payer: Self-pay | Admitting: Family Medicine

## 2023-07-13 NOTE — Telephone Encounter (Signed)
Patient did not receive application for Farxiga in the mail.  Will send a new application out.

## 2023-07-15 LAB — COMPREHENSIVE METABOLIC PANEL
ALT: 14 [IU]/L (ref 0–32)
AST: 16 [IU]/L (ref 0–40)
Albumin: 4 g/dL (ref 3.8–4.8)
Alkaline Phosphatase: 78 [IU]/L (ref 44–121)
BUN/Creatinine Ratio: 10 — ABNORMAL LOW (ref 12–28)
BUN: 10 mg/dL (ref 8–27)
Bilirubin Total: 0.3 mg/dL (ref 0.0–1.2)
CO2: 25 mmol/L (ref 20–29)
Calcium: 9.2 mg/dL (ref 8.7–10.3)
Chloride: 104 mmol/L (ref 96–106)
Creatinine, Ser: 1.03 mg/dL — ABNORMAL HIGH (ref 0.57–1.00)
Globulin, Total: 2 g/dL (ref 1.5–4.5)
Glucose: 99 mg/dL (ref 70–99)
Potassium: 4.5 mmol/L (ref 3.5–5.2)
Sodium: 143 mmol/L (ref 134–144)
Total Protein: 6 g/dL (ref 6.0–8.5)
eGFR: 57 mL/min/{1.73_m2} — ABNORMAL LOW (ref >59)

## 2023-07-15 LAB — HEMOGLOBIN A1C
Est. average glucose Bld gHb Est-mCnc: 157 mg/dL
Hgb A1c MFr Bld: 7.1 % — ABNORMAL HIGH (ref 4.8–5.6)

## 2023-07-15 LAB — CBC WITH DIFFERENTIAL/PLATELET
Basophils Absolute: 0.1 10*3/uL (ref 0.0–0.2)
Basos: 1 %
EOS (ABSOLUTE): 0.2 10*3/uL (ref 0.0–0.4)
Eos: 3 %
Hematocrit: 41.3 % (ref 34.0–46.6)
Hemoglobin: 12.6 g/dL (ref 11.1–15.9)
Immature Grans (Abs): 0 10*3/uL (ref 0.0–0.1)
Immature Granulocytes: 0 %
Lymphocytes Absolute: 2.5 10*3/uL (ref 0.7–3.1)
Lymphs: 38 %
MCH: 28.6 pg (ref 26.6–33.0)
MCHC: 30.5 g/dL — ABNORMAL LOW (ref 31.5–35.7)
MCV: 94 fL (ref 79–97)
Monocytes Absolute: 0.7 10*3/uL (ref 0.1–0.9)
Monocytes: 11 %
Neutrophils Absolute: 3.2 10*3/uL (ref 1.4–7.0)
Neutrophils: 47 %
Platelets: 320 10*3/uL (ref 150–450)
RBC: 4.4 x10E6/uL (ref 3.77–5.28)
RDW: 13.9 % (ref 11.7–15.4)
WBC: 6.7 10*3/uL (ref 3.4–10.8)

## 2023-07-15 LAB — LIPID PANEL
Chol/HDL Ratio: 2 {ratio} (ref 0.0–4.4)
Cholesterol, Total: 173 mg/dL (ref 100–199)
HDL: 85 mg/dL (ref >39)
LDL Chol Calc (NIH): 64 mg/dL (ref 0–99)
Triglycerides: 147 mg/dL (ref 0–149)
VLDL Cholesterol Cal: 24 mg/dL (ref 5–40)

## 2023-07-15 LAB — TSH: TSH: 7.53 u[IU]/mL — ABNORMAL HIGH (ref 0.450–4.500)

## 2023-07-15 LAB — VITAMIN B12: Vitamin B-12: 427 pg/mL (ref 232–1245)

## 2023-07-15 LAB — METHYLMALONIC ACID, SERUM: Methylmalonic Acid: 319 nmol/L (ref 0–378)

## 2023-07-15 LAB — T4, FREE: Free T4: 1.3 ng/dL (ref 0.82–1.77)

## 2023-07-16 ENCOUNTER — Other Ambulatory Visit: Payer: Self-pay

## 2023-07-16 ENCOUNTER — Telehealth: Payer: Self-pay

## 2023-07-16 DIAGNOSIS — E039 Hypothyroidism, unspecified: Secondary | ICD-10-CM

## 2023-07-16 MED ORDER — LEVOTHYROXINE SODIUM 25 MCG PO TABS: 25.0000 ug | ORAL_TABLET | Freq: Every day | ORAL | 0 refills | Status: DC

## 2023-07-16 NOTE — Telephone Encounter (Signed)
Called patient and she stated she wanted to know if she is suppose to keep taking her metoprolol.  After reading last office visit, I made patient aware she is suppose to continue the Metoprolol

## 2023-07-16 NOTE — Telephone Encounter (Signed)
Copied from CRM [id number]. Topic: Clinical - Medication Question >> Jul 13, 2023  4:24 PM Shelah Lewandowsky wrote: Reason for CRM: Patient has question about new blood pressure medication.  She is asking if she needs to stop the old one or take both for now.  Please call [phone]

## 2023-07-31 ENCOUNTER — Other Ambulatory Visit: Payer: Self-pay | Admitting: Cardiology

## 2023-08-07 ENCOUNTER — Other Ambulatory Visit: Payer: Self-pay

## 2023-08-13 ENCOUNTER — Ambulatory Visit: Payer: PPO | Attending: Cardiology | Admitting: Cardiology

## 2023-08-13 ENCOUNTER — Ambulatory Visit (INDEPENDENT_AMBULATORY_CARE_PROVIDER_SITE_OTHER): Payer: PPO | Admitting: Family Medicine

## 2023-08-13 ENCOUNTER — Encounter: Payer: Self-pay | Admitting: Family Medicine

## 2023-08-13 VITALS — BP 172/98 | HR 97 | Temp 97.6°F | Ht 67.0 in | Wt 183.8 lb

## 2023-08-13 DIAGNOSIS — I110 Hypertensive heart disease with heart failure: Secondary | ICD-10-CM | POA: Diagnosis not present

## 2023-08-13 DIAGNOSIS — J0110 Acute frontal sinusitis, unspecified: Secondary | ICD-10-CM | POA: Diagnosis not present

## 2023-08-13 MED ORDER — AMOXICILLIN 875 MG PO TABS: 875.0000 mg | ORAL_TABLET | Freq: Two times a day (BID) | ORAL | 0 refills | Status: AC

## 2023-08-13 NOTE — Telephone Encounter (Signed)
Spoke with patient.  She received her application in the mail and says she has an appointment with Dr. Sedalia Muta Wednesday and will take to the office to have faxed to me.

## 2023-08-13 NOTE — Assessment & Plan Note (Signed)
Not well controlled Headaches could be related to high blood pressure. BP Readings from Last 3 Encounters:  08/13/23 (!) 172/98  07/12/23 (!) 150/70  03/08/23 112/70  -Continue Toprol xl 25 mg twice daily -Increase Valsartan to 160mg  daily (take two 80mg  tablets). -Provide blood pressure log and advise patient to monitor blood pressure at home, ideally a couple of hours after taking medication. -Schedule follow-up visit in two weeks to reassess blood pressure control.

## 2023-08-13 NOTE — Assessment & Plan Note (Signed)
Acute Symptoms of sinus congestion and headache for two weeks. No sore throat. Ears appear full but not infected. Nasal passages appear swollen. Frontal and maxillary sinus tender. -Prescribe Amoxicillin 875mg  twice daily for 10 days. -Advise use of warm showers, Mucinex, and warm beverages to help with congestion. -Try lemon and honey as needed for cough or sore throat

## 2023-08-13 NOTE — Patient Instructions (Signed)
Take 2 valsartan tablets once per day.  Keep BP log and bring with you in 2 weeks.

## 2023-08-13 NOTE — Progress Notes (Signed)
Subjective:  Patient ID: April Munoz, female    DOB: 02-06-1951  Age: 73 y.o. MRN: [id number]  Chief Complaint  Patient presents with   Hypertension    1 month follow up   Discussed the use of AI scribe software for clinical note transcription with the patient, who gave verbal consent to proceed.   HPI Hypertension:  Patient is here for a 1 month follow up on hypertension. Patient was placed on Valsartan 80 mg once daily at last visit. The patient is currently on Toprol-XL 25 mg. The patient has a history of not responding well to medications, often requiring higher doses for the desired effect.  Sinus pain: The patient, with a history of hypertension, presents with a two-week history of upper respiratory symptoms, including nasal congestion and headaches. They describe the headaches as being located in the frontal region, and deny any associated sore throat. The patient also reports a sensation of fullness in the head and ears. They deny any recent exposure to sick contacts.     07/12/2023    8:55 AM 02/28/2023   11:05 AM 01/10/2023    9:17 AM 09/26/2022    9:24 AM 06/23/2022   10:06 AM  Depression screen PHQ 2/9  Decreased Interest 1 1 3 3 2   Down, Depressed, Hopeless 2 3 3 3 3   PHQ - 2 Score 3 4 6 6 5   Altered sleeping 3 2 3 3 3   Tired, decreased energy 3 3 3 3 3   Change in appetite 0 3 1 3  0  Feeling bad or failure about yourself  0 2 2 1 1   Trouble concentrating 0 3 2 3 1   Moving slowly or fidgety/restless 1 2 2 3 1   Suicidal thoughts 0 0 0 0 0  PHQ-9 Score 10 19 19 22 14   Difficult doing work/chores Not difficult at all Very difficult Very difficult Very difficult Extremely dIfficult        07/12/2023    8:55 AM  Fall Risk   Falls in the past year? 1  Number falls in past yr: 0  Injury with Fall? 0  Risk for fall due to : Impaired balance/gait  Follow up Falls evaluation completed    Patient Care Team: Blane Ohara, MD as PCP - General (Family Medicine) Dulce Sellar  Iline Oven, MD as PCP - Cardiology (Cardiology) Regan Lemming, MD as PCP - Electrophysiology (Cardiology) Baldo Daub, MD as Consulting Physician (Cardiology) Misenheimer, Marcial Pacas, MD as Consulting Physician (Unknown Physician Specialty) Darleene Cleaver, MD as Consulting Physician (General Surgery) Gabriel Carina, [facility] (Inactive) (Pharmacist)   Review of Systems  Constitutional:  Negative for appetite change, fatigue and fever.  HENT:  Positive for congestion, ear pain, rhinorrhea, sinus pressure and sneezing. Negative for sore throat.   Respiratory:  Negative for cough, chest tightness, shortness of breath and wheezing.   Cardiovascular:  Negative for chest pain and palpitations.  Gastrointestinal:  Negative for abdominal pain, constipation, diarrhea, nausea and vomiting.  Genitourinary:  Negative for dysuria and hematuria.  Musculoskeletal:  Negative for arthralgias, back pain, joint swelling and myalgias.  Skin:  Negative for rash.  Neurological:  Positive for headaches. Negative for dizziness and weakness.  Psychiatric/Behavioral:  Negative for dysphoric mood. The patient is not nervous/anxious.     Current Outpatient Medications on File Prior to Visit  Medication Sig Dispense Refill   B-D 3CC LUER-LOK SYR 25GX1" 25G X 1" 3 ML MISC USE WITH B12 INJECTIONS 3 each 10  dapagliflozin propanediol (FARXIGA) 10 MG TABS tablet TAKE 1 TABLET BY MOUTH ONCE DAILY *REFILL REQUEST* 30 tablet 10   donepezil (ARICEPT) 10 MG tablet TAKE 1 TABLET BY MOUTH EVERY DAY AT BEDTIME *REFILL REQUEST* 30 tablet 10   EPINEPHRINE 0.3 mg/0.3 mL IJ SOAJ injection INJECT 0.3 MG INTRAMUSCULARLY AS NEEDED FOR anaphylaxis 2 each 1   gabapentin (NEURONTIN) 300 MG capsule TAKE 1 CAPSULE BY MOUTH EVERY DAY AT BEDTIME *REFILL REQUEST* 30 capsule 10   levothyroxine (SYNTHROID) 25 MCG tablet Take 1 tablet (25 mcg total) by mouth daily. 60 tablet 0   Melatonin 10 MG TABS Take by mouth.     metFORMIN (GLUCOPHAGE)  1000 MG tablet TAKE 1 TABLET BY MOUTH EVERY MORNING AND 1 TABLET EVERY DAY AT BEDTIME *PATIENT NEEDS APPOINTMENT* *REFILL REQUEST* 60 tablet 10   metoprolol succinate (TOPROL-XL) 25 MG 24 hr tablet Take 1 tablet (25 mg total) by mouth 2 (two) times daily. Patient needs an appointment for further refills. 2 nd attempt 30 tablet 0   ramelteon (ROZEREM) 8 MG tablet Take 1 tablet (8 mg total) by mouth at bedtime. 30 tablet 2   rivaroxaban (XARELTO) 20 MG TABS tablet TAKE 1 TABLET BY MOUTH ONCE DAILY WITH SUPPER 30 tablet 2   rosuvastatin (CRESTOR) 5 MG tablet TAKE 1 TABLET BY MOUTH ONCE DAILY 90 tablet 1   Syringe/Needle, Disp, (SYRINGE 3CC/27GX1-1/4") 27G X 1-1/4" 3 ML MISC 1 each by Does not apply route every 30 (thirty) days. 100 each 0   traZODone (DESYREL) 50 MG tablet Take 1 tablet (50 mg total) by mouth at bedtime. 30 tablet 2   valsartan (DIOVAN) 80 MG tablet Take 1 tablet (80 mg total) by mouth daily. 90 tablet 0   venlafaxine XR (EFFEXOR-XR) 75 MG 24 hr capsule TAKE 3 CAPSULES BY MOUTH EACH MORNING 90 capsule 1   No current facility-administered medications on file prior to visit.   Past Medical History:  Diagnosis Date   Anxiety    Arthritis    Ataxic gait    Atrial fibrillation (HCC)    Atrophy of thyroid    B12 deficiency 10/31/2017   Cellulitis of abdominal wall 07/07/2016   Chronic anticoagulation 05/31/2018   Depression    Depression, major, recurrent, mild (HCC) 09/16/2019   Diabetes mellitus without complication (HCC)    Diabetic glomerulopathy (HCC) 09/16/2019   Esophageal stricture 06/05/2017   Essential hypertension 02/24/2013   Fatigue 01/20/2020   Fibromyalgia 02/24/2013   Full dentures    Hereditary and idiopathic peripheral neuropathy 02/24/2013   High risk medication use 11/07/2017   Hyperlipidemia 02/24/2013   Hypertension    Hypertensive heart disease 02/24/2013   Incontinence    Insomnia    Longstanding persistent atrial fibrillation (HCC) 09/16/2019   Mild vitamin D  deficiency 09/16/2019   Mixed hyperlipidemia 02/24/2013   Murmur, cardiac 08/15/2015   Neuropathy in diabetes (HCC) 10/31/2017   Osteoporosis    Other amnesia    Other amnesia    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    Paroxysmal atrial fibrillation (HCC) 08/15/2015   CHADS2vasc=3 CHADS2vasc=3   Primary insomnia    Restless legs syndrome 03/14/2013   Secondary hypothyroidism 09/16/2019   Thoracic or lumbosacral neuritis or radiculitis 02/24/2013   Type 2 diabetes mellitus, without long-term current use of insulin (HCC) 02/24/2013   Urge incontinence of urine 09/16/2019   Wears glasses    Past Surgical History:  Procedure Laterality Date   A-FLUTTER ABLATION N/A 07/30/2019  Procedure: A-FLUTTER ABLATION;  Surgeon: Regan Lemming, MD;  Location: [facility];  Service: Cardiovascular;  Laterality: N/A;   ATRIAL FIBRILLATION ABLATION N/A 03/21/2019   Procedure: ATRIAL FIBRILLATION ABLATION;  Surgeon: Regan Lemming, MD;  Location: [facility];  Service: Cardiovascular;  Laterality: N/A;   ATRIAL FIBRILLATION ABLATION N/A 10/27/2021   Procedure: ATRIAL FIBRILLATION ABLATION;  Surgeon: Regan Lemming, MD;  Location: [facility];  Service: Cardiovascular;  Laterality: N/A;   BREAST REDUCTION SURGERY  1983   BUBBLE STUDY  11/26/2020   Procedure: BUBBLE STUDY;  Surgeon: Chrystie Nose, MD;  Location: [facility] ENDOSCOPY;  Service: Cardiovascular;;   CARDIOVERSION N/A 11/26/2020   Procedure: CARDIOVERSION;  Surgeon: Chrystie Nose, MD;  Location: [facility] ENDOSCOPY;  Service: Cardiovascular;  Laterality: N/A;   CATARACT EXTRACTION, BILATERAL  09-14-2016, 14-Sep-2017   CHOLECYSTECTOMY  1971   open   DIAGNOSTIC LAPAROSCOPY  2010   lysis of adhesions   DILATION AND CURETTAGE OF UTERUS  2002/09/14   KNEE ARTHROSCOPY  1998   left   KNEE ARTHROSCOPY W/ LATERAL RETINACULAR REPAIR     MASS EXCISION Left 11/11/2013   Procedure: EXCISION MUCOID CYST LEFT INDEX  FINGER/DEBRIDEMENT LEFT INDEX FINGER;  Surgeon: Nicki Reaper, MD;  Location: [facility];  Service: Orthopedics;  Laterality: Left;  ANESTHESIA: IV REGIONAL/FAB   SHOULDER ARTHROSCOPY W/ ROTATOR CUFF REPAIR  09-14-2005   left   TEE WITHOUT CARDIOVERSION N/A 11/26/2020   Procedure: TRANSESOPHAGEAL ECHOCARDIOGRAM (TEE);  Surgeon: Chrystie Nose, MD;  Location: [facility] ENDOSCOPY;  Service: Cardiovascular;  Laterality: N/A;   TRIGGER FINGER RELEASE Left 11/11/2013   Procedure: RELEASE A-1 PULLEY LEFT RING FINGER;  Surgeon: Nicki Reaper, MD;  Location: [facility];  Service: Orthopedics;  Laterality: Left;   UMBILICAL HERNIA REPAIR  2008, 2010    Family History  Problem Relation Age of Onset   Atrial fibrillation Mother    Heart attack Mother    Heart failure Father    Heart disease Brother    Heart attack Brother    Bladder Cancer Brother    Diabetes Maternal Grandmother    Heart failure Paternal Grandfather    Stroke Paternal Grandfather    Social History   Socioeconomic History   Marital status: Widowed    Spouse name: Raiford Noble   Number of children: 2   Years of education: 14   Highest education level: Some college, no degree  Occupational History   Occupation: retired  Tobacco Use   Smoking status: Never   Smokeless tobacco: Never  Vaping Use   Vaping status: Never Used  Substance and Sexual Activity   Alcohol use: Yes    Alcohol/week: 1.0 - 2.0 standard drink of alcohol    Types: 1 - 2 Glasses of wine per week    Comment: 4 times a month   Drug use: No   Sexual activity: Not Currently  Other Topics Concern   Not on file  Social History Narrative   Husband passed away 09-14-2021 - one daughter that lives in [place] (adopted) and one son (adopted)   Left handed   Caffeine: caffeine free drinks. 2 cup of coffee a day   Social Drivers of Health   Financial Resource Strain: High Risk (02/28/2023)   Overall Financial Resource Strain (CARDIA)    Difficulty of Paying  Living Expenses: Very hard  Food Insecurity: Food Insecurity Present (02/28/2023)   Hunger Vital Sign  Worried About Programme researcher, broadcasting/film/video in the Last Year: Sometimes true    [employer] of Food in the Last Year: Never true  Transportation Needs: No Transportation Needs (02/28/2023)   PRAPARE - Administrator, Civil Service (Medical): No    Lack of Transportation (Non-Medical): No  Physical Activity: Inactive (02/28/2023)   Exercise Vital Sign    Days of Exercise per Week: 0 days    Minutes of Exercise per Session: 0 min  Stress: No Stress Concern Present (02/28/2023)   [employer] of Occupational Health - Occupational Stress Questionnaire    Feeling of Stress : Only a little  Social Connections: Socially Isolated (03/08/2023)   Social Connection and Isolation Panel [NHANES]    Frequency of Communication with Friends and Family: More than three times a week    Frequency of Social Gatherings with Friends and Family: More than three times a week    Attends Religious Services: Never    Database administrator or Organizations: No    Attends Banker Meetings: Not on file    Marital Status: Widowed    Objective:  BP (!) 172/98 (BP Location: Left Arm, Patient Position: Sitting)   Pulse 97   Temp 97.6 F (36.4 C) (Temporal)   Ht 5\' 7"  (1.702 m)   Wt 183 lb 12.8 oz (83.4 kg)   SpO2 98%   BMI 28.79 kg/m      08/13/2023   10:39 AM 07/12/2023    9:29 AM 07/12/2023    8:48 AM  BP/Weight  Systolic BP 172 150 148  Diastolic BP 98 70 92  Wt. (Lbs) 183.8  183  BMI 28.79 kg/m2  28.66 kg/m2    Physical Exam Vitals reviewed.  Constitutional:      General: She is not in acute distress.    Appearance: Normal appearance. She is ill-appearing.  HENT:     Right Ear: Decreased hearing noted. A middle ear effusion is present.     Left Ear: Decreased hearing noted. A middle ear effusion is present.     Nose:     Right Turbinates: Swollen.     Left Turbinates:  Swollen.     Right Sinus: Maxillary sinus tenderness and frontal sinus tenderness present.     Left Sinus: Maxillary sinus tenderness and frontal sinus tenderness present.  Eyes:     Conjunctiva/sclera: Conjunctivae normal.  Cardiovascular:     Rate and Rhythm: Regular rhythm. Tachycardia present.     Heart sounds: Normal heart sounds. No murmur heard. Pulmonary:     Effort: Pulmonary effort is normal.     Breath sounds: Normal breath sounds. No wheezing.  Musculoskeletal:        General: Normal range of motion.  Skin:    General: Skin is warm.  Neurological:     Mental Status: She is alert. Mental status is at baseline.  Psychiatric:        Mood and Affect: Mood normal.        Behavior: Behavior normal.    Lab Results  Component Value Date   WBC 6.7 07/12/2023   HGB 12.6 07/12/2023   HCT 41.3 07/12/2023   PLT 320 07/12/2023   GLUCOSE 99 07/12/2023   CHOL 173 07/12/2023   TRIG 147 07/12/2023   HDL 85 07/12/2023   LDLCALC 64 07/12/2023   ALT 14 07/12/2023   AST 16 07/12/2023   NA 143 07/12/2023   K 4.5 07/12/2023   CL 104 07/12/2023  CREATININE 1.03 (H) 07/12/2023   BUN 10 07/12/2023   CO2 25 07/12/2023   TSH 7.530 (H) 07/12/2023   HGBA1C 7.1 (H) 07/12/2023   MICROALBUR 80 03/15/2021      Assessment & Plan:    Hypertensive heart disease with congestive heart failure, unspecified heart failure type [facility]) Assessment & Plan: Not well controlled Headaches could be related to high blood pressure. BP Readings from Last 3 Encounters:  08/13/23 (!) 172/98  07/12/23 (!) 150/70  03/08/23 112/70  -Continue Toprol xl 25 mg twice daily -Increase Valsartan to 160mg  daily (take two 80mg  tablets). -Provide blood pressure log and advise patient to monitor blood pressure at home, ideally a couple of hours after taking medication. -Schedule follow-up visit in two weeks to reassess blood pressure control.     Acute non-recurrent frontal sinusitis Assessment &  Plan: Acute Symptoms of sinus congestion and headache for two weeks. No sore throat. Ears appear full but not infected. Nasal passages appear swollen. Frontal and maxillary sinus tender. -Prescribe Amoxicillin 875mg  twice daily for 10 days. -Advise use of warm showers, Mucinex, and warm beverages to help with congestion. -Try lemon and honey as needed for cough or sore throat  Orders: -     Amoxicillin; Take 1 tablet (875 mg total) by mouth 2 (two) times daily for 10 days.  Dispense: 20 tablet; Refill: 0     Meds ordered this encounter  Medications   amoxicillin (AMOXIL) 875 MG tablet    Sig: Take 1 tablet (875 mg total) by mouth 2 (two) times daily for 10 days.    Dispense:  20 tablet    Refill:  0    No orders of the defined types were placed in this encounter.    Follow-up: Return in about 2 weeks (around 08/27/2023) for BP recheck, nurse visit.   An After Visit Summary was printed and given to the patient.  Total time spent on today's visit was 35 minutes, including both face-to-face time and nonface-to-face time personally spent on review of chart (labs and imaging), discussing labs and goals, discussing further work-up, treatment options, referrals to specialist if needed, reviewing outside records if pertinent, answering patient's questions, and coordinating care.    Lajuana Matte, FNP [facility] [phone]

## 2023-08-21 ENCOUNTER — Telehealth: Payer: Self-pay

## 2023-08-21 NOTE — Telephone Encounter (Signed)
Patient notified of appointment with [name]. Also, I have rescheduled the lab work/ blood pressure check appointment. Our office was unable to do it on the same day as the other appointment and the patient was fine with that.

## 2023-08-21 NOTE — Telephone Encounter (Signed)
 Copied from CRM [id number]. Topic: Appointments - Scheduling Inquiry for Clinic >> Aug 21, 2023 11:02 AM Elle L wrote: Reason for CRM: The patient is requesting to reschedule her lab appointment to the 12th or 13th due to transportation. Her call back number is [phone]

## 2023-08-21 NOTE — Telephone Encounter (Signed)
Copied from CRM [id number]. Topic: General - Other >> Aug 21, 2023 12:09 PM Eunice Blase wrote: Reason for CRM: Pt returning call to reschedule appt for labs and bone density. Please call pt  [phone].

## 2023-08-21 NOTE — Telephone Encounter (Signed)
Pt is scheduled on 4/25 arriving at 8:30am at [facility].

## 2023-08-21 NOTE — Telephone Encounter (Signed)
I left a message for the patient to call the office back about getting this appointment rescheduled. Appointment has been canceled. I am waiting for the patient to call back.

## 2023-08-21 NOTE — Telephone Encounter (Signed)
 I have spoken with the patient. She would like to get her Mammogram and DEXA scheduled for the same day as her Blood work and BP check. Rosaria, can you please schedule this patient for a mammogram and DEXA. Once scheduled, please let me know and I will call her back to get the lab work and blood pressure check rescheduled with our office. Thank you

## 2023-08-27 ENCOUNTER — Other Ambulatory Visit: Payer: Self-pay | Admitting: Family Medicine

## 2023-08-27 ENCOUNTER — Other Ambulatory Visit: Payer: PPO

## 2023-08-29 ENCOUNTER — Other Ambulatory Visit: Payer: PPO

## 2023-09-05 NOTE — Telephone Encounter (Signed)
Patient was a no show for her appointment-I am closing encounter since she never returned her application.

## 2023-09-25 NOTE — Progress Notes (Unsigned)
 Acute Office Visit  Subjective:    Patient ID: April Munoz, female    DOB: [birth year], 73 y.o.   MRN: [id number]  Chief Complaint  Patient presents with   balance problems   Depression    Discussed the use of AI scribe software for clinical note transcription with the patient, who gave verbal consent to proceed.  History of Present Illness   The patient, with a history of diabetes complicated by neuropathy, chronic kidney disease 3B, and glomerulopathy, hyperlipidemia, atrial fibrillation, history of TIAs, depression with questionable bipolar, who presents with worsening balance problems, knee arthritis, and urinary incontinence. The patient reports a decline in mood over the past few weeks, with increased feelings of sadness and a lack of motivation to get out of bed. The patient has been mostly bed-bound for the past week, which may be contributing to her reported weakness and balance issues. The patient also reports increased difficulty with tasks such as showering due to balance problems. The patient has a history of falls and expresses fear of falling again.  The patient's knee arthritis is causing pain and swelling, particularly in the left knee. The patient reports difficulty walking and standing due to knee pain.  The patient also reports urinary incontinence, with sudden urges to urinate and inability to reach the bathroom in time. This is a change from previous visits when the patient denied any urinary issues.  The patient's caregiver reports that the patient has been having difficulty managing her medications. The patient admits to taking her medications inconsistently, particularly her antidepressant, Effexor. The patient has been taking Effexor sporadically, sometimes taking multiple doses at once, which may be contributing to her worsening depression.    That is here with caretaker.  They are very confused about her medications.  She has had some pills come in pill packs from  [facility] and she has numerous bottles.   She is a very poor historian and we spent a lot of time trying to figure out her medicines.   Diabetes was last evaluated in December and her A1c was 7.1.  Currently she is on Farxiga 10 mg daily.  At that time her TSH was 7.530 and her T4 was 1.3.  I started her on Synthroid 25 mcg daily.  She is due for recheck of her TSH and free T4 today.   Flowsheet Row Office Visit from 09/26/2023 in [facility] Office Visit from 07/12/2023 in [facility] Clinical Support from 02/28/2023 in [facility]  Thoughts that you would be better off dead, or of hurting yourself in some way Not at all Not at all Not at all  PHQ-9 Total Score 20 10 19              09/26/2023    9:51 AM 07/12/2023    8:58 AM 01/10/2023    9:19 AM 02/14/2021   11:03 AM  GAD 7 : Generalized Anxiety Score  Nervous, Anxious, on Edge 3 1 2 3   Control/stop worrying 2 2 2 3   Worry too much - different things 0 1 2 2   Trouble relaxing 3 2 3 3   Restless 3 2 3 3   Easily annoyed or irritable 0 1 0 0  Afraid - awful might happen 2 1 1 2   Total GAD 7 Score 13 10 13 16   Anxiety Difficulty Very difficult Somewhat difficult  Not difficult at all        Past Medical  History:  Diagnosis Date   Anxiety    Arthritis    Ataxic gait    Atrial fibrillation (HCC)    Atrophy of thyroid    B12 deficiency 10/31/2017   Cellulitis of abdominal wall 07/07/2016   Chronic anticoagulation 05/31/2018   Depression    Depression, major, recurrent, mild (HCC) 09/16/2019   Diabetes mellitus without complication (HCC)    Diabetic glomerulopathy (HCC) 09/16/2019   Esophageal stricture 06/05/2017   Essential hypertension 02/24/2013   Fatigue 01/20/2020   Fibromyalgia 02/24/2013   Full dentures    Hereditary and idiopathic peripheral neuropathy 02/24/2013   High risk medication use 11/07/2017   Hyperlipidemia 02/24/2013   Hypertension    Hypertensive heart  disease 02/24/2013   Incontinence    Insomnia    Longstanding persistent atrial fibrillation (HCC) 09/16/2019   Mild vitamin D deficiency 09/16/2019   Mixed hyperlipidemia 02/24/2013   Murmur, cardiac 08/15/2015   Neuropathy in diabetes (HCC) 10/31/2017   Osteoporosis    Other amnesia    Other amnesia    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    Paroxysmal atrial fibrillation (HCC) 08/15/2015   CHADS2vasc=3 CHADS2vasc=3   Primary insomnia    Restless legs syndrome 03/14/2013   Secondary hypothyroidism 09/16/2019   Thoracic or lumbosacral neuritis or radiculitis 02/24/2013   Type 2 diabetes mellitus, without long-term current use of insulin (HCC) 02/24/2013   Urge incontinence of urine 09/16/2019   Wears glasses     Past Surgical History:  Procedure Laterality Date   A-FLUTTER ABLATION N/A 07/30/2019   Procedure: A-FLUTTER ABLATION;  Surgeon: Regan Lemming, MD;  Location: [facility];  Service: Cardiovascular;  Laterality: N/A;   ATRIAL FIBRILLATION ABLATION N/A 03/21/2019   Procedure: ATRIAL FIBRILLATION ABLATION;  Surgeon: Regan Lemming, MD;  Location: [facility];  Service: Cardiovascular;  Laterality: N/A;   ATRIAL FIBRILLATION ABLATION N/A 10/27/2021   Procedure: ATRIAL FIBRILLATION ABLATION;  Surgeon: Regan Lemming, MD;  Location: [facility];  Service: Cardiovascular;  Laterality: N/A;   BREAST REDUCTION SURGERY  1983   BUBBLE STUDY  11/26/2020   Procedure: BUBBLE STUDY;  Surgeon: Chrystie Nose, MD;  Location: [facility] ENDOSCOPY;  Service: Cardiovascular;;   CARDIOVERSION N/A 11/26/2020   Procedure: CARDIOVERSION;  Surgeon: Chrystie Nose, MD;  Location: [facility] ENDOSCOPY;  Service: Cardiovascular;  Laterality: N/A;   CATARACT EXTRACTION, BILATERAL  2018, 2019   CHOLECYSTECTOMY  1971   open   DIAGNOSTIC LAPAROSCOPY  2010   lysis of adhesions   DILATION AND CURETTAGE OF UTERUS  2004   KNEE ARTHROSCOPY  1998   left   KNEE  ARTHROSCOPY W/ LATERAL RETINACULAR REPAIR     MASS EXCISION Left 11/11/2013   Procedure: EXCISION MUCOID CYST LEFT INDEX FINGER/DEBRIDEMENT LEFT INDEX FINGER;  Surgeon: Nicki Reaper, MD;  Location: [facility];  Service: Orthopedics;  Laterality: Left;  ANESTHESIA: IV REGIONAL/FAB   SHOULDER ARTHROSCOPY W/ ROTATOR CUFF REPAIR  2007   left   TEE WITHOUT CARDIOVERSION N/A 11/26/2020   Procedure: TRANSESOPHAGEAL ECHOCARDIOGRAM (TEE);  Surgeon: Chrystie Nose, MD;  Location: [facility] ENDOSCOPY;  Service: Cardiovascular;  Laterality: N/A;   TRIGGER FINGER RELEASE Left 11/11/2013   Procedure: RELEASE A-1 PULLEY LEFT RING FINGER;  Surgeon: Nicki Reaper, MD;  Location: [facility];  Service: Orthopedics;  Laterality: Left;   UMBILICAL HERNIA REPAIR  2008, 2010    Family History  Problem Relation Age of Onset  Atrial fibrillation Mother    Heart attack Mother    Heart failure Father    Heart disease Brother    Heart attack Brother    Bladder Cancer Brother    Diabetes Maternal Grandmother    Heart failure Paternal Grandfather    Stroke Paternal Grandfather     Social History   Socioeconomic History   Marital status: Widowed    Spouse name: Raiford Noble   Number of children: 2   Years of education: 14   Highest education level: Some college, no degree  Occupational History   Occupation: retired  Tobacco Use   Smoking status: Never   Smokeless tobacco: Never  Vaping Use   Vaping status: Never Used  Substance and Sexual Activity   Alcohol use: Yes    Alcohol/week: 1.0 - 2.0 standard drink of alcohol    Types: 1 - 2 Glasses of wine per week    Comment: 4 times a month   Drug use: No   Sexual activity: Not Currently  Other Topics Concern   Not on file  Social History Narrative   Husband passed away 10-25-21 - one daughter that lives in [place] (adopted) and one son (adopted)   Left handed   Caffeine: caffeine free drinks. 2 cup of coffee a day   Social Drivers of Manufacturing engineer Strain: High Risk (02/28/2023)   Overall Financial Resource Strain (CARDIA)    Difficulty of Paying Living Expenses: Very hard  Food Insecurity: Food Insecurity Present (02/28/2023)   Hunger Vital Sign    Worried About Running Out of Food in the Last Year: Sometimes true    Ran Out of Food in the Last Year: Never true  Transportation Needs: No Transportation Needs (02/28/2023)   PRAPARE - Administrator, Civil Service (Medical): No    Lack of Transportation (Non-Medical): No  Physical Activity: Inactive (02/28/2023)   Exercise Vital Sign    Days of Exercise per Week: 0 days    Minutes of Exercise per Session: 0 min  Stress: No Stress Concern Present (02/28/2023)   [employer] of Occupational Health - Occupational Stress Questionnaire    Feeling of Stress : Only a little  Social Connections: Socially Isolated (03/08/2023)   Social Connection and Isolation Panel [NHANES]    Frequency of Communication with Friends and Family: More than three times a week    Frequency of Social Gatherings with Friends and Family: More than three times a week    Attends Religious Services: Never    Database administrator or Organizations: No    Attends Banker Meetings: Not on file    Marital Status: Widowed  Intimate Partner Violence: Not At Risk (02/28/2023)   Humiliation, Afraid, Rape, and Kick questionnaire    Fear of Current or Ex-Partner: No    Emotionally Abused: No    Physically Abused: No    Sexually Abused: No    Outpatient Medications Prior to Visit  Medication Sig Dispense Refill   acyclovir (ZOVIRAX) 400 MG tablet TAKE 1 TABLET BY MOUTH 5 TIMES DAILY FOR 7 DAYS 35 tablet 10   B-D 3CC LUER-LOK SYR 25GX1" 25G X 1" 3 ML MISC USE WITH B12 INJECTIONS 3 each 10   dapagliflozin propanediol (FARXIGA) 10 MG TABS tablet TAKE 1 TABLET BY MOUTH ONCE DAILY *REFILL REQUEST* 30 tablet 10   EPINEPHRINE 0.3 mg/0.3 mL IJ SOAJ injection INJECT 0.3 MG  INTRAMUSCULARLY AS NEEDED FOR anaphylaxis 2 each 1  gabapentin (NEURONTIN) 300 MG capsule TAKE 1 CAPSULE BY MOUTH EVERY DAY AT BEDTIME *REFILL REQUEST* 30 capsule 10   levothyroxine (SYNTHROID) 25 MCG tablet Take 1 tablet (25 mcg total) by mouth daily. 60 tablet 0   metFORMIN (GLUCOPHAGE) 1000 MG tablet TAKE 1 TABLET BY MOUTH EVERY MORNING AND 1 TABLET EVERY DAY AT BEDTIME *PATIENT NEEDS APPOINTMENT* *REFILL REQUEST* 60 tablet 10   metoprolol succinate (TOPROL-XL) 25 MG 24 hr tablet Take 1 tablet (25 mg total) by mouth 2 (two) times daily. Patient needs an appointment for further refills. 2 nd attempt 30 tablet 0   rosuvastatin (CRESTOR) 5 MG tablet TAKE 1 TABLET BY MOUTH ONCE DAILY 90 tablet 1   Syringe/Needle, Disp, (SYRINGE 3CC/27GX1-1/4") 27G X 1-1/4" 3 ML MISC 1 each by Does not apply route every 30 (thirty) days. 100 each 0   torsemide (DEMADEX) 20 MG tablet Take 20 mg by mouth 3 (three) times a week.     valsartan (DIOVAN) 80 MG tablet Take 1 tablet (80 mg total) by mouth daily. 90 tablet 0   Vitamin D, Ergocalciferol, (DRISDOL) 1.25 MG (50000 UNIT) CAPS capsule Take 50,000 Units by mouth once a week.     donepezil (ARICEPT) 10 MG tablet TAKE 1 TABLET BY MOUTH EVERY DAY AT BEDTIME *REFILL REQUEST* 30 tablet 10   Melatonin 10 MG TABS Take by mouth.     OLANZapine (ZYPREXA) 5 MG tablet Take 5 mg by mouth at bedtime.     omega-3 acid ethyl esters (LOVAZA) 1 g capsule Take 2 capsules by mouth 2 (two) times daily.     ramelteon (ROZEREM) 8 MG tablet Take 1 tablet (8 mg total) by mouth at bedtime. 30 tablet 2   rivaroxaban (XARELTO) 20 MG TABS tablet TAKE 1 TABLET BY MOUTH ONCE DAILY WITH SUPPER 30 tablet 2   sertraline (ZOLOFT) 50 MG tablet Take 50 mg by mouth daily.     traZODone (DESYREL) 50 MG tablet Take 1 tablet (50 mg total) by mouth at bedtime. 30 tablet 2   venlafaxine XR (EFFEXOR-XR) 75 MG 24 hr capsule TAKE 3 CAPSULES BY MOUTH EACH MORNING 90 capsule 1   No facility-administered  medications prior to visit.    Allergies  Allergen Reactions   Atorvastatin     myalgias    Review of Systems  Constitutional:  Positive for fatigue. Negative for chills and fever.  HENT:  Negative for congestion, ear pain and sore throat.   Respiratory:  Negative for cough and shortness of breath.   Cardiovascular:  Negative for chest pain and palpitations.  Gastrointestinal:  Negative for abdominal pain, constipation, diarrhea, nausea and vomiting.  Genitourinary:  Negative for difficulty urinating and dysuria.       Bladder incontinence.   Musculoskeletal:  Positive for arthralgias. Negative for back pain and myalgias.  Neurological:  Positive for tremors, weakness and headaches. Negative for dizziness.  Psychiatric/Behavioral:  Positive for confusion and sleep disturbance. Negative for dysphoric mood and suicidal ideas. The patient is nervous/anxious.        Objective:        09/26/2023    9:35 AM 08/13/2023   10:39 AM 07/12/2023    9:29 AM  Vitals with BMI  Height 5\' 7"  5\' 7"    Weight 175 lbs 183 lbs 13 oz   BMI 27.4 28.78   Systolic 90 172 150  Diastolic 60 98 70  Pulse 58 97     No data found.   Physical Exam Vitals reviewed.  Constitutional:      Appearance: Normal appearance. She is not ill-appearing.  HENT:     Right Ear: Tympanic membrane and ear canal normal.     Left Ear: Tympanic membrane and ear canal normal.     Nose: Nose normal. No congestion or rhinorrhea.     Mouth/Throat:     Pharynx: No oropharyngeal exudate or posterior oropharyngeal erythema.  Neck:     Thyroid: No thyroid mass.     Vascular: No carotid bruit.  Cardiovascular:     Rate and Rhythm: Normal rate and regular rhythm.     Pulses: Normal pulses.     Heart sounds: Normal heart sounds. No murmur heard. Pulmonary:     Effort: Pulmonary effort is normal.     Breath sounds: Normal breath sounds.  Abdominal:     General: Bowel sounds are normal.     Palpations: Abdomen is soft.      Tenderness: There is no abdominal tenderness.  Musculoskeletal:        General: Tenderness (left knee tender over patellar tendon. FROM.) present.  Neurological:     General: No focal deficit present.     Mental Status: She is alert.     Cranial Nerves: No cranial nerve deficit.     Coordination: Coordination abnormal (positive rhombergs. negative dysmetria. negative pronator drift.).  Psychiatric:        Behavior: Behavior normal.     Comments: Depressed. Scattered.      Health Maintenance Due  Topic Date Due   Hepatitis C Screening  Never done   DTaP/Tdap/Td (1 - Tdap) Never done   Zoster Vaccines- Shingrix (1 of 2) Never done   MAMMOGRAM  04/02/2023   DEXA SCAN  04/02/2023    There are no preventive care reminders to display for this patient.   Lab Results  Component Value Date   TSH 2.710 09/26/2023   Lab Results  Component Value Date   WBC 8.1 09/26/2023   HGB 13.5 09/26/2023   HCT 41.9 09/26/2023   MCV 95 09/26/2023   PLT 435 09/26/2023   Lab Results  Component Value Date   NA 143 09/26/2023   K 4.4 09/26/2023   CO2 21 09/26/2023   GLUCOSE 166 (H) 09/26/2023   BUN 24 09/26/2023   CREATININE 1.58 (H) 09/26/2023   BILITOT 0.5 09/26/2023   ALKPHOS 110 09/26/2023   AST 34 09/26/2023   ALT 37 (H) 09/26/2023   PROT 6.9 09/26/2023   ALBUMIN 4.4 09/26/2023   CALCIUM 9.6 09/26/2023   ANIONGAP 11 03/21/2019   EGFR 34 (L) 09/26/2023   Lab Results  Component Value Date   CHOL 173 07/12/2023   Lab Results  Component Value Date   HDL 85 07/12/2023   Lab Results  Component Value Date   LDLCALC 64 07/12/2023   Lab Results  Component Value Date   TRIG 147 07/12/2023   Lab Results  Component Value Date   CHOLHDL 2.0 07/12/2023   Lab Results  Component Value Date   HGBA1C 7.1 (H) 07/12/2023       Assessment & Plan:  Primary insomnia Assessment & Plan: Continue on rozerem 8 mg before bed.   Orders: -     Magnesium  Bipolar disorder with  severe depression (HCC) Assessment & Plan: Continue Venlafaxine, Sertraline.   Moderate vascular dementia with mood disturbance (HCC) Assessment & Plan: Continue aricept 10 mg before bed     Orders: -     CBC with Differential/Platelet -  Comprehensive metabolic panel -     MR BRAIN WO CONTRAST; Future  Vitamin D insufficiency Assessment & Plan: The current medical regimen is effective;  continue present plan and medications. Continue vitamin d 50 K once weekly.  Orders: -     VITAMIN D 25 Hydroxy (Vit-D Deficiency, Fractures)  Ataxic gait Assessment & Plan: Check labs. MRI of her brain as it has been nearly 2 years since she had this. Refer back to Dr. Marjory Lies with [facility]  Orders: -     TSH -     Magnesium -     Ambulatory referral to Home Health -     Ambulatory referral to Neurology -     MR BRAIN WO CONTRAST; Future  Weakness -     TSH -     Ambulatory referral to Home Health -     Ambulatory referral to Neurology  Overflow incontinence of urine Assessment & Plan: Check UA  Orders: -     POCT URINALYSIS DIP ([facility]) -     Ambulatory referral to Urology  Diabetic polyneuropathy associated with type 2 diabetes mellitus (HCC) Assessment & Plan: The current medical regimen is effective;  continue present plan and medications.    Longstanding persistent atrial fibrillation [facility]) Assessment & Plan: Well controlled.  No changes to medicines.  Continue to work on eating a healthy diet and exercise.  Labs drawn today.  Follow-up with cardiology as previously scheduled.   Hypertensive heart disease with congestive heart failure, unspecified heart failure type [facility]) Assessment & Plan: Well controlled. Continue Toprol xl 25 mg twice daily, valsartan 80 mg daily.  Continue to work on eating a healthy diet and exercise.  Labs drawn today   Polypharmacy -     AMB Referral [facility]BCI Care Management  Other orders -     traZODone HCl; Take 1  tablet (100 mg total) by mouth at bedtime as needed for sleep.  Dispense: 90 tablet; Refill: 0 -     Venlafaxine HCl ER; Take 1 capsule (75 mg total) by mouth daily with breakfast.     Assessment and Plan    Depression Depression worsened with increased symptoms. Effexor previously beneficial. Complicated by inconsistent adherence. - Restart Effexor XR 75 mg once daily in the morning. - Refer to a psychiatrist for further evaluation and management.  Medication Management Issues Memory issues cause inconsistent medication intake. Current system ineffective. - Switch to [facility] pharmacy for medication management. - Use a pill box for daily medication organization. - Provide a list of medications with instructions. - Refer to a pharmacist for medication review and management.  Insomnia Inconsistent trazodone use affects sleep. Consistent regimen advised. - Prescribe trazodone 100 mg once at night for insomnia.  Balance Issues and Weakness Balance issues and leg weakness, possibly due to inactivity. Fall risk due to knee problems. - Order home physical therapy for balance and strength training. - Refer to a neurologist for further evaluation of balance issues.  Urinary Incontinence Incontinence likely stress or urge type. No infection symptoms. - Refer to a urologist for evaluation of urinary incontinence. - Obtain a urine sample for analysis.  General Health Maintenance Requires assistance with medication management and social support services. - Refer to social work for assistance with home care and support services.  Follow-up Requires monitoring of treatment response and care plan adjustment. - Schedule follow-up appointment in 6-8 weeks. - Perform blood work at the next visit.  Meds ordered this encounter  Medications   traZODone (DESYREL) 100 MG tablet    Sig: Take 1 tablet (100 mg total) by mouth at bedtime as needed for sleep.    Dispense:  90 tablet     Refill:  0   DISCONTD: venlafaxine XR (EFFEXOR-XR) 75 MG 24 hr capsule    Sig: Take 1 capsule (75 mg total) by mouth daily with breakfast.   venlafaxine XR (EFFEXOR-XR) 75 MG 24 hr capsule    Sig: Take 1 capsule (75 mg total) by mouth daily with breakfast.    Orders Placed This Encounter  Procedures   MR Brain Wo Contrast   CBC with Differential/Platelet   Comprehensive metabolic panel   TSH   VITAMIN D 25 Hydroxy (Vit-D Deficiency, Fractures)   Magnesium   AMB Referral VBCI Care Management   Ambulatory referral to Urology   Ambulatory referral to Home Health   Ambulatory referral to Neurology   POCT URINALYSIS DIP (CLINITEK)     Follow-up: Return in about 2 months (around 11/26/2023) for chronic follow up, 40 minutes please.  An After Visit Summary was printed and given to the patient.   Clayborn Bigness I Leal-Borjas,acting as a scribe for Blane Ohara, MD.,have documented all relevant documentation on the behalf of Blane Ohara, MD,as directed by  Blane Ohara, MD while in the presence of Blane Ohara, MD.   Blane Ohara, MD [facility] [phone]

## 2023-09-26 ENCOUNTER — Encounter: Payer: Self-pay | Admitting: Family Medicine

## 2023-09-26 ENCOUNTER — Ambulatory Visit: Admitting: Family Medicine

## 2023-09-26 VITALS — BP 90/60 | HR 58 | Temp 97.4°F | Resp 14 | Ht 67.0 in | Wt 175.0 lb

## 2023-09-26 DIAGNOSIS — E1142 Type 2 diabetes mellitus with diabetic polyneuropathy: Secondary | ICD-10-CM

## 2023-09-26 DIAGNOSIS — G308 Other Alzheimer's disease: Secondary | ICD-10-CM | POA: Diagnosis not present

## 2023-09-26 DIAGNOSIS — E559 Vitamin D deficiency, unspecified: Secondary | ICD-10-CM

## 2023-09-26 DIAGNOSIS — I110 Hypertensive heart disease with heart failure: Secondary | ICD-10-CM | POA: Diagnosis not present

## 2023-09-26 DIAGNOSIS — I4811 Longstanding persistent atrial fibrillation: Secondary | ICD-10-CM | POA: Diagnosis not present

## 2023-09-26 DIAGNOSIS — F5101 Primary insomnia: Secondary | ICD-10-CM | POA: Diagnosis not present

## 2023-09-26 DIAGNOSIS — Z79899 Other long term (current) drug therapy: Secondary | ICD-10-CM | POA: Diagnosis not present

## 2023-09-26 DIAGNOSIS — F02B3 Dementia in other diseases classified elsewhere, moderate, with mood disturbance: Secondary | ICD-10-CM

## 2023-09-26 DIAGNOSIS — F314 Bipolar disorder, current episode depressed, severe, without psychotic features: Secondary | ICD-10-CM | POA: Diagnosis not present

## 2023-09-26 DIAGNOSIS — R531 Weakness: Secondary | ICD-10-CM

## 2023-09-26 DIAGNOSIS — R260 Ataxic gait: Secondary | ICD-10-CM | POA: Diagnosis not present

## 2023-09-26 DIAGNOSIS — F01B3 Vascular dementia, moderate, with mood disturbance: Secondary | ICD-10-CM

## 2023-09-26 DIAGNOSIS — N39490 Overflow incontinence: Secondary | ICD-10-CM | POA: Diagnosis not present

## 2023-09-26 MED ORDER — VENLAFAXINE HCL ER 75 MG PO CP24
75.0000 mg | ORAL_CAPSULE | Freq: Every day | ORAL | Status: DC
Start: 2023-09-26 — End: 2023-09-26

## 2023-09-26 MED ORDER — TRAZODONE HCL 100 MG PO TABS: 100.0000 mg | ORAL_TABLET | Freq: Every evening | ORAL | 0 refills | Status: DC | PRN

## 2023-09-26 MED ORDER — VENLAFAXINE HCL ER 75 MG PO CP24: 75.0000 mg | ORAL_CAPSULE | Freq: Every day | ORAL | Status: DC

## 2023-09-26 NOTE — Patient Instructions (Addendum)
 VISIT SUMMARY:  During today's visit, we discussed your worsening depression, balance issues, knee arthritis, and new urinary incontinence. We also addressed your difficulties with medication management and sleep. A comprehensive plan was created to help manage these issues and improve your overall well-being.  YOUR PLAN:  -DEPRESSION: Depression is a mental health condition characterized by persistent feelings of sadness and loss of interest. We will restart your Effexor XR 75 mg once daily in the morning.  -MEDICATION MANAGEMENT ISSUES: You have been having trouble taking your medications consistently, which can affect your health. We will switch to [facility] pharmacy for better medication management, use a pill box to organize your daily medications, provide a list of medications with instructions, and refer you to a [facility] pharmacist for a medication review.  -INSOMNIA: Insomnia is difficulty falling or staying asleep. We will prescribe trazodone 100 mg to be taken once at night to help with your sleep.  -BALANCE ISSUES AND WEAKNESS: Balance issues and leg weakness can increase your risk of falling. We will arrange for home physical therapy to improve your balance and strength, and refer you to a neurologist for further evaluation.  -URINARY INCONTINENCE: Urinary incontinence is the loss of bladder control. We will refer you to a urologist for evaluation and obtain a urine sample for analysis.  -GENERAL HEALTH MAINTENANCE: We will refer you to social work for assistance with home care and support services to help manage your overall health and well-being.  INSTRUCTIONS:  Please schedule a follow-up appointment in 8 weeks. We will also perform blood work at your next visit.

## 2023-09-27 ENCOUNTER — Encounter: Payer: Self-pay | Admitting: Family Medicine

## 2023-09-27 LAB — CBC WITH DIFFERENTIAL/PLATELET
Basophils Absolute: 0.1 10*3/uL (ref 0.0–0.2)
Basos: 1 %
EOS (ABSOLUTE): 0.2 10*3/uL (ref 0.0–0.4)
Eos: 2 %
Hematocrit: 41.9 % (ref 34.0–46.6)
Hemoglobin: 13.5 g/dL (ref 11.1–15.9)
Immature Grans (Abs): 0 10*3/uL (ref 0.0–0.1)
Immature Granulocytes: 0 %
Lymphocytes Absolute: 2.3 10*3/uL (ref 0.7–3.1)
Lymphs: 28 %
MCH: 30.6 pg (ref 26.6–33.0)
MCHC: 32.2 g/dL (ref 31.5–35.7)
MCV: 95 fL (ref 79–97)
Monocytes Absolute: 0.8 10*3/uL (ref 0.1–0.9)
Monocytes: 9 %
Neutrophils Absolute: 4.8 10*3/uL (ref 1.4–7.0)
Neutrophils: 60 %
Platelets: 435 10*3/uL (ref 150–450)
RBC: 4.41 x10E6/uL (ref 3.77–5.28)
RDW: 13 % (ref 11.7–15.4)
WBC: 8.1 10*3/uL (ref 3.4–10.8)

## 2023-09-27 LAB — COMPREHENSIVE METABOLIC PANEL
ALT: 37 IU/L — ABNORMAL HIGH (ref 0–32)
AST: 34 IU/L (ref 0–40)
Albumin: 4.4 g/dL (ref 3.8–4.8)
Alkaline Phosphatase: 110 IU/L (ref 44–121)
BUN/Creatinine Ratio: 15 (ref 12–28)
BUN: 24 mg/dL (ref 8–27)
Bilirubin Total: 0.5 mg/dL (ref 0.0–1.2)
CO2: 21 mmol/L (ref 20–29)
Calcium: 9.6 mg/dL (ref 8.7–10.3)
Chloride: 104 mmol/L (ref 96–106)
Creatinine, Ser: 1.58 mg/dL — ABNORMAL HIGH (ref 0.57–1.00)
Globulin, Total: 2.5 g/dL (ref 1.5–4.5)
Glucose: 166 mg/dL — ABNORMAL HIGH (ref 70–99)
Potassium: 4.4 mmol/L (ref 3.5–5.2)
Sodium: 143 mmol/L (ref 134–144)
Total Protein: 6.9 g/dL (ref 6.0–8.5)
eGFR: 34 mL/min/{1.73_m2} — ABNORMAL LOW (ref >59)

## 2023-09-27 LAB — TSH: TSH: 2.71 u[IU]/mL (ref 0.450–4.500)

## 2023-09-27 LAB — MAGNESIUM: Magnesium: 2 mg/dL (ref 1.6–2.3)

## 2023-09-27 LAB — VITAMIN D 25 HYDROXY (VIT D DEFICIENCY, FRACTURES): Vit D, 25-Hydroxy: 27.5 ng/mL — ABNORMAL LOW (ref 30.0–100.0)

## 2023-09-30 DIAGNOSIS — Z79899 Other long term (current) drug therapy: Secondary | ICD-10-CM | POA: Insufficient documentation

## 2023-09-30 DIAGNOSIS — R531 Weakness: Secondary | ICD-10-CM | POA: Insufficient documentation

## 2023-09-30 NOTE — Assessment & Plan Note (Signed)
 Start Venlafaxine xr 75 mg daily in am.

## 2023-09-30 NOTE — Assessment & Plan Note (Signed)
Well controlled.  No changes to medicines.  Continue to work on eating a healthy diet and exercise.  Labs drawn today.  Follow-up with cardiology as previously scheduled.

## 2023-09-30 NOTE — Assessment & Plan Note (Addendum)
 Check labs. Order MRI of her brain as it has been nearly 2 years since she had this. Refer back to Dr. Marjory Lies with [facility] Order home health care physical therapy, skilled nursing, and social work.

## 2023-09-30 NOTE — Assessment & Plan Note (Signed)
 Start trazodone 100 mg before bed.

## 2023-09-30 NOTE — Assessment & Plan Note (Signed)
Continue aricept 10 mg before bed.  ?

## 2023-09-30 NOTE — Assessment & Plan Note (Signed)
 Well controlled. Continue Toprol xl 25 mg twice daily, valsartan 80 mg daily.  Continue to work on eating a healthy diet and exercise.  Labs drawn today

## 2023-09-30 NOTE — Assessment & Plan Note (Signed)
 Check UA

## 2023-09-30 NOTE — Assessment & Plan Note (Signed)
 The current medical regimen is effective;  continue present plan and medications.

## 2023-09-30 NOTE — Assessment & Plan Note (Signed)
The current medical regimen is effective;  continue present plan and medications. Continue vitamin d 50 K once weekly.

## 2023-10-01 NOTE — Assessment & Plan Note (Signed)
-   Switch to [facility] pharmacy for medication management. - Use a pill box for daily medication organization. - Provided a list of medications with instructions. - Refer to a pharmacist for medication review and management.

## 2023-10-01 NOTE — Assessment & Plan Note (Signed)
 Order home health care physical therapy, skilled nursing, and social work.

## 2023-10-03 NOTE — Telephone Encounter (Unsigned)
 Copied from CRM [id number]. Topic: Clinical - Home Health Verbal Orders >> Oct 02, 2023  2:29 PM Franchot Heidelberg wrote: Caller/Agency: Doug from [facility] Number: [phone] Service Requested: Physical Therapy, Skilled Nursing, and Social Work Frequency:   Delaying start of care until next week per patient's request   Any new concerns about the patient?

## 2023-10-04 ENCOUNTER — Encounter: Payer: Self-pay | Admitting: Family Medicine

## 2023-10-04 ENCOUNTER — Other Ambulatory Visit: Payer: Self-pay | Admitting: Family Medicine

## 2023-10-08 ENCOUNTER — Other Ambulatory Visit: Payer: Self-pay | Admitting: Family Medicine

## 2023-10-08 DIAGNOSIS — F5101 Primary insomnia: Secondary | ICD-10-CM

## 2023-10-08 DIAGNOSIS — I110 Hypertensive heart disease with heart failure: Secondary | ICD-10-CM

## 2023-10-10 ENCOUNTER — Encounter: Payer: Self-pay | Admitting: Family Medicine

## 2023-10-10 ENCOUNTER — Telehealth: Payer: Self-pay

## 2023-10-10 NOTE — Telephone Encounter (Signed)
 Copied from CRM [id number]. Topic: General - Other >> Oct 09, 2023  5:33 PM Shelah Lewandowsky wrote: Reason for CRM: Doug with [facility] calling to update- Patient refused services, does not want anyone coming to her home.

## 2023-10-11 ENCOUNTER — Ambulatory Visit (HOSPITAL_BASED_OUTPATIENT_CLINIC_OR_DEPARTMENT_OTHER)
Admission: RE | Admit: 2023-10-11 | Discharge: 2023-10-11 | Disposition: A | Discharge status: Home / Self Care | Source: Ambulatory Visit | Attending: Family Medicine | Admitting: Family Medicine

## 2023-10-11 ENCOUNTER — Other Ambulatory Visit: Payer: Self-pay | Admitting: Family Medicine

## 2023-10-11 DIAGNOSIS — I6782 Cerebral ischemia: Secondary | ICD-10-CM | POA: Diagnosis not present

## 2023-10-11 DIAGNOSIS — G9389 Other specified disorders of brain: Secondary | ICD-10-CM | POA: Diagnosis not present

## 2023-10-11 DIAGNOSIS — F01B3 Vascular dementia, moderate, with mood disturbance: Secondary | ICD-10-CM

## 2023-10-11 DIAGNOSIS — R9089 Other abnormal findings on diagnostic imaging of central nervous system: Secondary | ICD-10-CM | POA: Diagnosis not present

## 2023-10-11 DIAGNOSIS — R260 Ataxic gait: Secondary | ICD-10-CM

## 2023-10-11 DIAGNOSIS — Z8673 Personal history of transient ischemic attack (TIA), and cerebral infarction without residual deficits: Secondary | ICD-10-CM | POA: Diagnosis not present

## 2023-10-11 MED ORDER — RIVAROXABAN 20 MG PO TABS: 20.0000 mg | ORAL_TABLET | Freq: Every day | ORAL | 3 refills | Status: DC

## 2023-10-14 ENCOUNTER — Other Ambulatory Visit: Payer: Self-pay | Admitting: Cardiology

## 2023-10-15 ENCOUNTER — Ambulatory Visit: Payer: PPO | Admitting: Family Medicine

## 2023-10-15 ENCOUNTER — Other Ambulatory Visit: Payer: Self-pay

## 2023-10-15 ENCOUNTER — Other Ambulatory Visit (HOSPITAL_COMMUNITY): Payer: Self-pay

## 2023-10-15 MED ORDER — METOPROLOL SUCCINATE ER 25 MG PO TB24
25.0000 mg | ORAL_TABLET | Freq: Two times a day (BID) | ORAL | 0 refills | Status: DC
  Filled 2023-10-15: qty 15, 8d supply, fill #0

## 2023-10-23 ENCOUNTER — Ambulatory Visit: Payer: PPO | Admitting: Family Medicine

## 2023-10-24 ENCOUNTER — Other Ambulatory Visit: Payer: Self-pay | Admitting: Family Medicine

## 2023-10-31 ENCOUNTER — Encounter: Payer: Self-pay | Admitting: Family Medicine

## 2023-11-06 ENCOUNTER — Other Ambulatory Visit: Payer: Self-pay

## 2023-11-06 MED ORDER — DONEPEZIL HCL 5 MG PO TABS: 5.0000 mg | ORAL_TABLET | Freq: Every day | ORAL | 0 refills | Status: DC

## 2023-11-07 DIAGNOSIS — R351 Nocturia: Secondary | ICD-10-CM | POA: Diagnosis not present

## 2023-11-07 DIAGNOSIS — N3941 Urge incontinence: Secondary | ICD-10-CM | POA: Diagnosis not present

## 2023-11-07 DIAGNOSIS — R350 Frequency of micturition: Secondary | ICD-10-CM | POA: Diagnosis not present

## 2023-11-15 ENCOUNTER — Other Ambulatory Visit (HOSPITAL_COMMUNITY): Payer: Self-pay

## 2023-11-15 ENCOUNTER — Other Ambulatory Visit: Payer: Self-pay

## 2023-11-15 ENCOUNTER — Other Ambulatory Visit: Payer: Self-pay | Admitting: Cardiology

## 2023-11-15 MED ORDER — METOPROLOL SUCCINATE ER 25 MG PO TB24
25.0000 mg | ORAL_TABLET | Freq: Two times a day (BID) | ORAL | 0 refills | Status: DC
  Filled 2023-11-15: qty 30, 15d supply, fill #0

## 2023-11-15 NOTE — Telephone Encounter (Signed)
 Rx refill sent to pharmacy.

## 2023-11-19 ENCOUNTER — Ambulatory Visit: Payer: PPO | Attending: Cardiology | Admitting: Cardiology

## 2023-11-19 ENCOUNTER — Encounter: Payer: Self-pay | Admitting: Cardiology

## 2023-11-19 VITALS — BP 106/70 | HR 64 | Ht 67.0 in | Wt 171.4 lb

## 2023-11-19 DIAGNOSIS — I483 Typical atrial flutter: Secondary | ICD-10-CM | POA: Diagnosis not present

## 2023-11-19 DIAGNOSIS — I10 Essential (primary) hypertension: Secondary | ICD-10-CM | POA: Diagnosis not present

## 2023-11-19 DIAGNOSIS — I4819 Other persistent atrial fibrillation: Secondary | ICD-10-CM | POA: Diagnosis not present

## 2023-11-19 DIAGNOSIS — D6869 Other thrombophilia: Secondary | ICD-10-CM

## 2023-11-19 MED ORDER — METOPROLOL SUCCINATE ER 50 MG PO TB24: 50.0000 mg | ORAL_TABLET | Freq: Every day | ORAL | 3 refills | Status: DC

## 2023-11-19 NOTE — Progress Notes (Signed)
  Electrophysiology Office Note:   Date:  11/19/2023  ID:  April Munoz, DOB 1951-04-08, MRN [id number]  Primary Cardiologist: April Hinds, MD Primary Heart Failure: None Electrophysiologist: April Brunelle Cortland Ding, MD      History of Present Illness:   April Munoz is a 73 y.o. female with h/o atrial fibrillation, depression, diabetes, hypertension, hyperlipidemia, TIA, atrial flutter seen today for routine electrophysiology followup.   Since last being seen in our clinic the patient reports doing well.  She has noted intermittent palpitations, but says that she overall feels well.  She continues to do her daily activities without restriction.  She has noted some weakness generalized over the last few months.  She has no other complaints.  she denies chest pain, palpitations, dyspnea, PND, orthopnea, nausea, vomiting, dizziness, syncope, edema, weight gain, or early satiety.   Review of systems complete and found to be negative unless listed in HPI.   EP Information / Studies Reviewed:    EKG is ordered today. Personal review as below.  EKG Interpretation Date/Time:  Monday Nov 19 2023 12:15:29 EDT Ventricular Rate:  64 PR Interval:  158 QRS Duration:  74 QT Interval:  424 QTC Calculation: 437 R Axis:   -15  Text Interpretation: Normal sinus rhythm Nonspecific T wave abnormality When compared with ECG of 27-Oct-2021 13:12, No significant change was found Confirmed by April Munoz ([id number]) on 11/19/2023 12:20:12 PM     Risk Assessment/Calculations:    CHA2DS2-VASc Score = 6  . This indicates a 9.7% annual risk of stroke. The patient's score is based upon: CHF History: 0 HTN History: 1 Diabetes History: 1 Stroke History: 2 Vascular Disease History: 0 Age Score: 1 Gender Score: 1             Physical Exam:   VS:  BP 106/70   Pulse 64   Ht 5\' 7"  (1.702 m)   Wt 171 lb 6.4 oz (77.7 kg)   SpO2 98%   BMI 26.85 kg/m    Wt Readings from Last 3 Encounters:  11/19/23 171 lb 6.4  oz (77.7 kg)  09/26/23 175 lb (79.4 kg)  08/13/23 183 lb 12.8 oz (83.4 kg)     GEN: Well nourished, well developed in no acute distress NECK: No JVD; No carotid bruits CARDIAC: Regular rate and rhythm, no murmurs, rubs, gallops RESPIRATORY:  Clear to auscultation without rales, wheezing or rhonchi  ABDOMEN: Soft, non-tender, non-distended EXTREMITIES:  No edema; No deformity   ASSESSMENT AND PLAN:    1.  Persistent atrial fibrillation/atypical atrial flutter: Post ablation 03/21/2019 with repeat ablation 10/27/2021.  On Toprol -XL twice daily.  She is having some lightheadedness and fatigue.  April Munoz switch to 50 mg in the evening.  2.  Typical atrial flutter: Post ablation 05/29/2020.  No obvious recurrence  3.  Secondary hypercoagulable state: On Xarelto  for atrial fibrillation  4.  Hypertension: Well-controlled  Follow up with Dr. Lawana Munoz in 12 months  Signed, April Munoz Cortland Ding, MD

## 2023-11-19 NOTE — Patient Instructions (Addendum)
 Medication Instructions:  Your physician has recommended you make the following change in your medication:  CHANGE the way you take your Metoprolol  (Toprol ) - take  50 mg ONCE daily at bedtime  *If you need a refill on your cardiac medications before your next appointment, please call your pharmacy*   Lab Work: None ordered   Testing/Procedures: None ordered   Follow-Up: At [facility], you and your health needs are our priority.  As part of our continuing mission to provide you with exceptional heart care, we have created designated Provider Care Teams.  These Care Teams include your primary Cardiologist (physician) and Advanced Practice Providers (APPs -  Physician Assistants and Nurse Practitioners) who all work together to provide you with the care you need, when you need it.  Your next appointment:   1 year(s)  The format for your next appointment:   In Person  Provider:   Agatha Horsfall, MD    Thank you for choosing [facility]!!   Reece Cane, RN [phone]

## 2023-11-21 ENCOUNTER — Encounter (HOSPITAL_COMMUNITY): Payer: Self-pay

## 2023-11-28 ENCOUNTER — Ambulatory Visit: Admitting: Family Medicine

## 2023-12-03 ENCOUNTER — Other Ambulatory Visit: Payer: Self-pay | Admitting: Family Medicine

## 2023-12-03 DIAGNOSIS — F5101 Primary insomnia: Secondary | ICD-10-CM

## 2023-12-26 ENCOUNTER — Ambulatory Visit: Admitting: Family Medicine

## 2023-12-26 ENCOUNTER — Telehealth: Payer: Self-pay

## 2023-12-26 VITALS — BP 136/70 | HR 61 | Temp 98.3°F | Ht 67.0 in | Wt 175.0 lb

## 2023-12-26 DIAGNOSIS — R410 Disorientation, unspecified: Secondary | ICD-10-CM

## 2023-12-26 DIAGNOSIS — E782 Mixed hyperlipidemia: Secondary | ICD-10-CM

## 2023-12-26 DIAGNOSIS — F314 Bipolar disorder, current episode depressed, severe, without psychotic features: Secondary | ICD-10-CM

## 2023-12-26 DIAGNOSIS — F5101 Primary insomnia: Secondary | ICD-10-CM | POA: Diagnosis not present

## 2023-12-26 DIAGNOSIS — E1121 Type 2 diabetes mellitus with diabetic nephropathy: Secondary | ICD-10-CM

## 2023-12-26 DIAGNOSIS — H5462 Unqualified visual loss, left eye, normal vision right eye: Secondary | ICD-10-CM | POA: Diagnosis not present

## 2023-12-26 DIAGNOSIS — I110 Hypertensive heart disease with heart failure: Secondary | ICD-10-CM | POA: Diagnosis not present

## 2023-12-26 DIAGNOSIS — F01C3 Vascular dementia, severe, with mood disturbance: Secondary | ICD-10-CM | POA: Diagnosis not present

## 2023-12-26 MED ORDER — LURASIDONE HCL 20 MG PO TABS: ORAL_TABLET | ORAL | 0 refills | Status: DC

## 2023-12-26 MED ORDER — VENLAFAXINE HCL ER 75 MG PO CP24: 75.0000 mg | ORAL_CAPSULE | Freq: Every day | ORAL | 0 refills | Status: DC

## 2023-12-26 MED ORDER — LEVOTHYROXINE SODIUM 25 MCG PO TABS: 25.0000 ug | ORAL_TABLET | Freq: Every day | ORAL | 1 refills | Status: DC

## 2023-12-26 MED ORDER — TORSEMIDE 20 MG PO TABS: 20.0000 mg | ORAL_TABLET | ORAL | 1 refills | Status: DC

## 2023-12-26 NOTE — Progress Notes (Signed)
 Subjective:  Patient ID: April Munoz, female    DOB: 12/06/1950  Age: 73 y.o. MRN: [id number]  Chief Complaint  Patient presents with   Medical Management of Chronic Issues   HPI: The patient, with a history of diabetes complicated by neuropathy, chronic kidney disease 3B, and glomerulopathy, hyperlipidemia, atrial fibrillation, history of TIAs, depression with questionable bipolar, and urinary incontinence.   Continues to report urinary incontinence, with sudden urges to urinate and inability to reach the bathroom in time. Currently taking gemtesa 75mg  daily. Patient needs to rescheduled with urology. Due for follow up. At initial appointment did cystoscopy and was told it was normal.    Diabetes was last evaluated in December and her A1c was 7.1.  Currently she is on Farxiga  10 mg daily, Metformin  1000mg  BID   Hyperthyroidism: Taking synthroid  25mg  daily  At that time her TSH was 2.710   High Cholesterol: Omega 3 and Crestor  5mg  daily  HTN: Valsartan  80mg  daily, Metoprolol  50mg  qhs and Torsemide  20mg  daily  Memory: Aricept  5 mg daily.  Insomnia: On trazodone  100 mg once daily at night. Not working. Drinks a little bit of wine. Going to sleep is the issue. 6-7 hours of sleep once she gets to sleep. Gets up a couple of times per night.   Depression: On venlafaxine  XR 75 mg once daily in am. Improved, but not at goal. Mood disorder questionnaire concerning for bipolar.   Patient is concerned she may have had another stroke. Her memory and her left eye (vision) has worsened. Patient was confused about her dog. She was looking for a dog that passed away 15 years ago. No weakness on either side of body. Balance is an issue. Her entire field of vision in her left eye is blurry. She is having daily headaches.       12/26/2023    3:01 PM 09/26/2023    9:51 AM 07/12/2023    8:55 AM 02/28/2023   11:05 AM 01/10/2023    9:17 AM  Depression screen PHQ 2/9  Decreased Interest 3 3 1 1 3   Down,  Depressed, Hopeless 3 3 2 3 3   PHQ - 2 Score 6 6 3 4 6   Altered sleeping 3 3 3 2 3   Tired, decreased energy 3 3 3 3 3   Change in appetite 0 2 0 3 1  Feeling bad or failure about yourself  0 3 0 2 2  Trouble concentrating 2 0 0 3 2  Moving slowly or fidgety/restless 0 3 1 2 2   Suicidal thoughts 0 0 0 0 0  PHQ-9 Score 14 20 10 19 19   Difficult doing work/chores Not difficult at all Very difficult Not difficult at all Very difficult Very difficult        09/26/2023    9:50 AM  Fall Risk   Falls in the past year? 0  Number falls in past yr: 0  Injury with Fall? 0  Risk for fall due to : Impaired balance/gait  Follow up Falls prevention discussed    Patient Care Team: Mercy Stall, MD as PCP - General (Family Medicine) Sandee Crook Margart Shears, MD as PCP - Cardiology (Cardiology) Lei Pump, MD as PCP - Electrophysiology (Cardiology) Hassan Links, MD as Consulting Physician (Cardiology) Misenheimer, Emeterio Hansen, MD as Consulting Physician (Unknown Physician Specialty) Elzie Handler, MD as Consulting Physician (General Surgery) Noelia Batman, [facility] (Inactive) (Pharmacist)   Review of Systems  Constitutional:  Positive for fatigue. Negative for chills and fever.  HENT:  Negative for congestion, ear pain, rhinorrhea and sore throat.   Respiratory:  Negative for cough and shortness of breath.   Cardiovascular:  Negative for chest pain.  Gastrointestinal:  Negative for abdominal pain, constipation, diarrhea, nausea and vomiting.  Genitourinary:  Negative for dysuria and urgency.  Musculoskeletal:  Positive for gait problem. Negative for back pain and myalgias.  Neurological:  Negative for dizziness, weakness, light-headedness and headaches.  Psychiatric/Behavioral:  Positive for sleep disturbance. Negative for dysphoric mood. The patient is not nervous/anxious.     Current Outpatient Medications on File Prior to Visit  Medication Sig Dispense Refill   acyclovir  (ZOVIRAX ) 400 MG  tablet TAKE 1 TABLET BY MOUTH 5 TIMES DAILY FOR 7 DAYS 35 tablet 10   dapagliflozin  propanediol (FARXIGA ) 10 MG TABS tablet TAKE 1 TABLET BY MOUTH ONCE DAILY *REFILL REQUEST* 30 tablet 10   donepezil  (ARICEPT ) 5 MG tablet Take 1 tablet (5 mg total) by mouth at bedtime. 90 tablet 0   gabapentin  (NEURONTIN ) 300 MG capsule TAKE 1 CAPSULE BY MOUTH EVERY DAY AT BEDTIME *REFILL REQUEST* 30 capsule 10   metFORMIN  (GLUCOPHAGE ) 1000 MG tablet TAKE 1 TABLET BY MOUTH EVERY MORNING AND 1 TABLET EVERY DAY AT BEDTIME *PATIENT NEEDS APPOINTMENT* *REFILL REQUEST* 60 tablet 10   metoprolol  succinate (TOPROL  XL) 50 MG 24 hr tablet Take 1 tablet (50 mg total) by mouth at bedtime. Take with or immediately following a meal. 90 tablet 3   omega-3 acid ethyl esters (LOVAZA ) 1 g capsule Take 2 capsules by mouth 2 (two) times daily.     rivaroxaban  (XARELTO ) 20 MG TABS tablet Take 1 tablet (20 mg total) by mouth daily with supper. 30 tablet 3   rosuvastatin  (CRESTOR ) 5 MG tablet TAKE 1 TABLET BY MOUTH ONCE DAILY 90 tablet 0   valsartan  (DIOVAN ) 80 MG tablet TAKE 1 TABLET BY MOUTH EVERY DAY 90 tablet 0   Vibegron (GEMTESA) 75 MG TABS Take by mouth.     Vitamin D , Ergocalciferol , (DRISDOL ) 1.25 MG (50000 UNIT) CAPS capsule Take 50,000 Units by mouth once a week.     B-D 3CC LUER-LOK SYR 25GX1 25G X 1 3 ML MISC USE WITH B12 INJECTIONS 3 each 10   EPINEPHRINE  0.3 mg/0.3 mL IJ SOAJ injection INJECT 0.3 MG INTRAMUSCULARLY AS NEEDED FOR anaphylaxis 2 each 1   Syringe/Needle, Disp, (SYRINGE 3CC/27GX1-1/4) 27G X 1-1/4 3 ML MISC 1 each by Does not apply route every 30 (thirty) days. 100 each 0   No current facility-administered medications on file prior to visit.   Past Medical History:  Diagnosis Date   Anxiety    Arthritis    Ataxic gait    Atrial fibrillation (HCC)    Atrophy of thyroid     B12 deficiency 10/31/2017   Cellulitis of abdominal wall 07/07/2016   Chronic anticoagulation 05/31/2018   Depression     Depression, major, recurrent, mild (HCC) 09/16/2019   Diabetes mellitus without complication (HCC)    Diabetic glomerulopathy (HCC) 09/16/2019   Esophageal stricture 06/05/2017   Essential hypertension 02/24/2013   Fatigue 01/20/2020   Fibromyalgia 02/24/2013   Full dentures    Hereditary and idiopathic peripheral neuropathy 02/24/2013   High risk medication use 11/07/2017   Hyperlipidemia 02/24/2013   Hypertension    Hypertensive heart disease 02/24/2013   Incontinence    Insomnia    Longstanding persistent atrial fibrillation (HCC) 09/16/2019   Mild vitamin D  deficiency 09/16/2019   Mixed hyperlipidemia 02/24/2013   Murmur, cardiac 08/15/2015  Neuropathy in diabetes (HCC) 10/31/2017   Osteoporosis    Other amnesia    Other amnesia    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    Paroxysmal atrial fibrillation (HCC) 08/15/2015   CHADS2vasc=3 CHADS2vasc=3   Primary insomnia    Restless legs syndrome 03/14/2013   Secondary hypothyroidism 09/16/2019   Thoracic or lumbosacral neuritis or radiculitis 02/24/2013   Type 2 diabetes mellitus, without long-term current use of insulin (HCC) 02/24/2013   Urge incontinence of urine 09/16/2019   Wears glasses    Past Surgical History:  Procedure Laterality Date   A-FLUTTER ABLATION N/A 07/30/2019   Procedure: A-FLUTTER ABLATION;  Surgeon: Lei Pump, MD;  Location: [facility];  Service: Cardiovascular;  Laterality: N/A;   ATRIAL FIBRILLATION ABLATION N/A 03/21/2019   Procedure: ATRIAL FIBRILLATION ABLATION;  Surgeon: Lei Pump, MD;  Location: [facility];  Service: Cardiovascular;  Laterality: N/A;   ATRIAL FIBRILLATION ABLATION N/A 10/27/2021   Procedure: ATRIAL FIBRILLATION ABLATION;  Surgeon: Lei Pump, MD;  Location: [facility];  Service: Cardiovascular;  Laterality: N/A;   BREAST REDUCTION SURGERY  1983   BUBBLE STUDY  11/26/2020   Procedure: BUBBLE STUDY;  Surgeon: Hazle Lites,  MD;  Location[facility] ENDOSCOPY;  Service: Cardiovascular;;   CARDIOVERSION N/A 11/26/2020   Procedure: CARDIOVERSION;  Surgeon: Hazle Lites, MD;  Location: [facility] ENDOSCOPY;  Service: Cardiovascular;  Laterality: N/A;   CATARACT EXTRACTION, BILATERAL  2018, 2019   CHOLECYSTECTOMY  1971   open   DIAGNOSTIC LAPAROSCOPY  2010   lysis of adhesions   DILATION AND CURETTAGE OF UTERUS  2004   KNEE ARTHROSCOPY  1998   left   KNEE ARTHROSCOPY W/ LATERAL RETINACULAR REPAIR     MASS EXCISION Left 11/11/2013   Procedure: EXCISION MUCOID CYST LEFT INDEX FINGER/DEBRIDEMENT LEFT INDEX FINGER;  Surgeon: Kemp Patter, MD;  Location: [facility];  Service: Orthopedics;  Laterality: Left;  ANESTHESIA: IV REGIONAL/FAB   SHOULDER ARTHROSCOPY W/ ROTATOR CUFF REPAIR  2007   left   TEE WITHOUT CARDIOVERSION N/A 11/26/2020   Procedure: TRANSESOPHAGEAL ECHOCARDIOGRAM (TEE);  Surgeon: Hazle Lites, MD;  Location: [facility] ENDOSCOPY;  Service: Cardiovascular;  Laterality: N/A;   TRIGGER FINGER RELEASE Left 11/11/2013   Procedure: RELEASE A-1 PULLEY LEFT RING FINGER;  Surgeon: Kemp Patter, MD;  Location: [facility];  Service: Orthopedics;  Laterality: Left;   UMBILICAL HERNIA REPAIR  2008, 2010    Family History  Problem Relation Age of Onset   Atrial fibrillation Mother    Heart attack Mother    Heart failure Father    Heart disease Brother    Heart attack Brother    Bladder Cancer Brother    Diabetes Maternal Grandmother    Heart failure Paternal Grandfather    Stroke Paternal Grandfather    Social History   Socioeconomic History   Marital status: Widowed    Spouse name: Sharin David   Number of children: 2   Years of education: 14   Highest education level: Some college, no degree  Occupational History   Occupation: retired  Tobacco Use   Smoking status: Never   Smokeless tobacco: Never  Vaping Use   Vaping status: Never Used  Substance and Sexual Activity   Alcohol use: Yes     Alcohol/week: 1.0 - 2.0 standard drink of alcohol    Types: 1 - 2 Glasses of wine per week  Comment: 4 times a month   Drug use: No   Sexual activity: Not Currently  Other Topics Concern   Not on file  Social History Narrative   Husband passed away January 16, 2022 - one daughter that lives in [place] (adopted) and one son (adopted)   Left handed   Caffeine: caffeine free drinks. 2 cup of coffee a day   Social Drivers of Corporate investment banker Strain: High Risk (02/28/2023)   Overall Financial Resource Strain (CARDIA)    Difficulty of Paying Living Expenses: Very hard  Food Insecurity: No Food Insecurity (12/26/2023)   Hunger Vital Sign    Worried About Running Out of Food in the Last Year: Never true    Ran Out of Food in the Last Year: Never true  Transportation Needs: No Transportation Needs (12/26/2023)   PRAPARE - Administrator, Civil Service (Medical): No    Lack of Transportation (Non-Medical): No  Physical Activity: Inactive (02/28/2023)   Exercise Vital Sign    Days of Exercise per Week: 0 days    Minutes of Exercise per Session: 0 min  Stress: No Stress Concern Present (02/28/2023)   [employer] of Occupational Health - Occupational Stress Questionnaire    Feeling of Stress : Only a little  Social Connections: Socially Isolated (03/08/2023)   Social Connection and Isolation Panel    Frequency of Communication with Friends and Family: More than three times a week    Frequency of Social Gatherings with Friends and Family: More than three times a week    Attends Religious Services: Never    Database administrator or Organizations: No    Attends Banker Meetings: Not on file    Marital Status: Widowed    Objective:  BP 136/70   Pulse 61   Temp 98.3 F (36.8 C)   Ht 5' 7 (1.702 m)   Wt 175 lb (79.4 kg)   SpO2 97%   BMI 27.41 kg/m      12/26/2023    2:41 PM 11/19/2023   12:18 PM 09/26/2023    9:35 AM  BP/Weight  Systolic BP 136 106 90   Diastolic BP 70 70 60  Wt. (Lbs) 175 171.4 175  BMI 27.41 kg/m2 26.85 kg/m2 27.41 kg/m2    Physical Exam Vitals reviewed.  Constitutional:      Appearance: Normal appearance. She is normal weight.  Neck:     Vascular: No carotid bruit.   Cardiovascular:     Rate and Rhythm: Normal rate and regular rhythm.     Heart sounds: Normal heart sounds.  Pulmonary:     Effort: Pulmonary effort is normal. No respiratory distress.     Breath sounds: Normal breath sounds.  Abdominal:     General: Abdomen is flat. Bowel sounds are normal.     Palpations: Abdomen is soft.     Tenderness: There is no abdominal tenderness.   Neurological:     Mental Status: She is alert.   Psychiatric:        Mood and Affect: Mood normal.        Behavior: Behavior normal.     Diabetic Foot Exam - Simple   Simple Foot Form  12/26/2023  8:37 PM  Visual Inspection No deformities, no ulcerations, no other skin breakdown bilaterally: Yes Sensation Testing Intact to touch and monofilament testing bilaterally: Yes Pulse Check Posterior Tibialis and Dorsalis pulse intact bilaterally: Yes Comments      Lab Results  Component Value Date   WBC 5.6 12/26/2023   HGB 12.0 12/26/2023   HCT 37.5 12/26/2023   PLT 321 12/26/2023   GLUCOSE 121 (H) 12/31/2023   CHOL 139 12/26/2023   TRIG 111 12/26/2023   HDL 81 12/26/2023   LDLCALC 39 12/26/2023   ALT 61 (H) 12/31/2023   AST 24 12/31/2023   NA 141 12/31/2023   K 5.2 12/31/2023   CL 103 12/31/2023   CREATININE 0.90 12/31/2023   BUN 18 12/31/2023   CO2 25 12/31/2023   TSH 2.710 09/26/2023   HGBA1C 6.2 (H) 12/26/2023   MICROALBUR 80 03/15/2021      Assessment & Plan:  Diabetic glomerulopathy (HCC) Assessment & Plan: Control: controlled. Recommend check sugars fasting daily. Recommend check feet daily. Recommend annual eye exams. Medicines: Farxiga  10 mg daily, metformin  1000 mg. Continue to work on eating a healthy diet and exercise.  Labs  drawn today.    Orders: -     CBC with Differential/Platelet -     Comprehensive metabolic panel with GFR -     Hemoglobin A1c -     Lipid panel  Hypertensive heart disease with congestive heart failure, unspecified heart failure type [facility]) Assessment & Plan: Well controlled. Continue Toprol  xl 25 mg twice daily, valsartan  80 mg daily, and torsemide  20 mg daily. .  Continue to work on eating a healthy diet and exercise.  Labs drawn today  Orders: -     Torsemide ; Take 1 tablet (20 mg total) by mouth 3 (three) times a week.  Dispense: 90 tablet; Refill: 1 -     CBC with Differential/Platelet -     Comprehensive metabolic panel with GFR -     Hemoglobin A1c -     Lipid panel  Bipolar disorder with severe depression (HCC) Assessment & Plan: Start latuda  20 mg once daily with meal preferably supper x 2 weeks, then increase to 20 mg 2 daily with meal. Continue venlafaxine  er 75 mg once in am.   Orders: -     Venlafaxine  HCl ER; Take 1 capsule (75 mg total) by mouth daily with breakfast.  Dispense: 90 capsule; Refill: 0  Vision loss, left eye Assessment & Plan: Ordering a ct scan of brain for tomorrow.   Orders: -     CT HEAD WO CONTRAST ( ); Future  Confusion Assessment & Plan: Ordering a ct scan of brain for tomorrow.   Orders: -     CT HEAD WO CONTRAST ( ); Future  Mixed hyperlipidemia Assessment & Plan: The current medical regimen is effective;  continue present plan and medications. Continue Crestor  5 mg daily and omega 3. Continue to work on eating a healthy diet and exercise.  Labs drawn today.      Severe vascular dementia with mood disturbance (HCC) Assessment & Plan: Continue aricept  5 mg daily. Rule out stroke.  Ordering a ct scan of brain for tomorrow.    Primary insomnia Assessment & Plan: Decrease trazodone  100 mg 1/2 at night x 1 weeks then stop it. Stop rozerem  one week later.  I am    Other orders -     Levothyroxine  Sodium; Take 1  tablet (25 mcg total) by mouth daily.  Dispense: 90 tablet; Refill: 1 -     Lurasidone  HCl; Take 1 tablet (20 mg total) by mouth daily with breakfast for 14 days, THEN 2 tablets (40 mg total) daily with breakfast for 14 days.  Dispense: 42 tablet; Refill: 0  Meds ordered this encounter  Medications   levothyroxine  (SYNTHROID ) 25 MCG tablet    Sig: Take 1 tablet (25 mcg total) by mouth daily.    Dispense:  90 tablet    Refill:  1   torsemide  (DEMADEX ) 20 MG tablet    Sig: Take 1 tablet (20 mg total) by mouth 3 (three) times a week.    Dispense:  90 tablet    Refill:  1   lurasidone  (LATUDA ) 20 MG TABS tablet    Sig: Take 1 tablet (20 mg total) by mouth daily with breakfast for 14 days, THEN 2 tablets (40 mg total) daily with breakfast for 14 days.    Dispense:  42 tablet    Refill:  0   DISCONTD: venlafaxine  XR (EFFEXOR -XR) 75 MG 24 hr capsule    Sig: Take 1 capsule (75 mg total) by mouth daily with breakfast.    Dispense:  90 capsule    Refill:  0   venlafaxine  XR (EFFEXOR -XR) 75 MG 24 hr capsule    Sig: Take 1 capsule (75 mg total) by mouth daily with breakfast.    Dispense:  90 capsule    Refill:  0    Orders Placed This Encounter  Procedures   CT HEAD WO CONTRAST ( )   CBC with Differential/Platelet   Comprehensive metabolic panel with GFR   Hemoglobin A1c   Lipid panel     Follow-up: Return in about 4 weeks (around 01/23/2024).   I,Katherina A Bramblett,acting as a scribe for Mercy Stall, MD.,have documented all relevant documentation on the behalf of Mercy Stall, MD,as directed by  Mercy Stall, MD while in the presence of Mercy Stall, MD.   An After Visit Summary was printed and given to the patient.  I attest that I have reviewed this visit and agree with the plan scribed by my staff.   Mercy Stall, MD [facility] [phone]

## 2023-12-26 NOTE — Telephone Encounter (Signed)
 Spoke with Maddie with [facility] and cancelled all rx's.

## 2023-12-26 NOTE — Patient Instructions (Addendum)
 2.   Decrease trazodone  100 mg 1/2 at night x 1 weeks then stop it. Stop rozerem  one week later.   3.   Start latuda 20 mg once daily with meal preferably supper x 2 weeks, then increase to 20 mg 2 daily with meal.  4.   Continue venlafaxine  er 75 mg once in am.    Ordering a ct scan of brain for tomorrow.

## 2023-12-27 ENCOUNTER — Telehealth: Payer: Self-pay

## 2023-12-27 ENCOUNTER — Ambulatory Visit: Payer: Self-pay | Admitting: Family Medicine

## 2023-12-27 LAB — CBC WITH DIFFERENTIAL/PLATELET
Basophils Absolute: 0.1 10*3/uL (ref 0.0–0.2)
Basos: 1 %
EOS (ABSOLUTE): 0.2 10*3/uL (ref 0.0–0.4)
Eos: 3 %
Hematocrit: 37.5 % (ref 34.0–46.6)
Hemoglobin: 12 g/dL (ref 11.1–15.9)
Immature Grans (Abs): 0 10*3/uL (ref 0.0–0.1)
Immature Granulocytes: 0 %
Lymphocytes Absolute: 2 10*3/uL (ref 0.7–3.1)
Lymphs: 35 %
MCH: 30.5 pg (ref 26.6–33.0)
MCHC: 32 g/dL (ref 31.5–35.7)
MCV: 95 fL (ref 79–97)
Monocytes Absolute: 0.5 10*3/uL (ref 0.1–0.9)
Monocytes: 9 %
Neutrophils Absolute: 2.9 10*3/uL (ref 1.4–7.0)
Neutrophils: 52 %
Platelets: 321 10*3/uL (ref 150–450)
RBC: 3.93 x10E6/uL (ref 3.77–5.28)
RDW: 13.1 % (ref 11.7–15.4)
WBC: 5.6 10*3/uL (ref 3.4–10.8)

## 2023-12-27 LAB — COMPREHENSIVE METABOLIC PANEL WITH GFR
ALT: 245 IU/L (ref 0–32)
AST: 295 IU/L (ref 0–40)
Albumin: 4 g/dL (ref 3.8–4.8)
Alkaline Phosphatase: 408 IU/L — ABNORMAL HIGH (ref 44–121)
BUN/Creatinine Ratio: 14 (ref 12–28)
BUN: 12 mg/dL (ref 8–27)
Bilirubin Total: 0.9 mg/dL (ref 0.0–1.2)
CO2: 21 mmol/L (ref 20–29)
Calcium: 8.5 mg/dL — ABNORMAL LOW (ref 8.7–10.3)
Chloride: 105 mmol/L (ref 96–106)
Creatinine, Ser: 0.86 mg/dL (ref 0.57–1.00)
Globulin, Total: 2.3 g/dL (ref 1.5–4.5)
Glucose: 111 mg/dL — ABNORMAL HIGH (ref 70–99)
Potassium: 4.4 mmol/L (ref 3.5–5.2)
Sodium: 142 mmol/L (ref 134–144)
Total Protein: 6.3 g/dL (ref 6.0–8.5)
eGFR: 71 mL/min/{1.73_m2} (ref >59)

## 2023-12-27 LAB — LIPID PANEL
Chol/HDL Ratio: 1.7 ratio (ref 0.0–4.4)
Cholesterol, Total: 139 mg/dL (ref 100–199)
HDL: 81 mg/dL (ref >39)
LDL Chol Calc (NIH): 39 mg/dL (ref 0–99)
Triglycerides: 111 mg/dL (ref 0–149)
VLDL Cholesterol Cal: 19 mg/dL (ref 5–40)

## 2023-12-27 LAB — HEMOGLOBIN A1C
Est. average glucose Bld gHb Est-mCnc: 131 mg/dL
Hgb A1c MFr Bld: 6.2 % — ABNORMAL HIGH (ref 4.8–5.6)

## 2023-12-27 NOTE — Telephone Encounter (Signed)
 April Munoz from [facility] called in with critical lab values:  AST - 295 ALT - 245

## 2023-12-28 ENCOUNTER — Ambulatory Visit (HOSPITAL_BASED_OUTPATIENT_CLINIC_OR_DEPARTMENT_OTHER)
Admission: RE | Admit: 2023-12-28 | Discharge: 2023-12-28 | Disposition: A | Discharge status: Home / Self Care | Source: Ambulatory Visit | Attending: Family Medicine | Admitting: Family Medicine

## 2023-12-28 DIAGNOSIS — Z87820 Personal history of traumatic brain injury: Secondary | ICD-10-CM

## 2023-12-28 DIAGNOSIS — G9389 Other specified disorders of brain: Secondary | ICD-10-CM | POA: Diagnosis not present

## 2023-12-28 DIAGNOSIS — R410 Disorientation, unspecified: Secondary | ICD-10-CM

## 2023-12-28 DIAGNOSIS — R9082 White matter disease, unspecified: Secondary | ICD-10-CM | POA: Diagnosis not present

## 2023-12-28 DIAGNOSIS — H5462 Unqualified visual loss, left eye, normal vision right eye: Secondary | ICD-10-CM

## 2023-12-31 ENCOUNTER — Ambulatory Visit: Payer: Self-pay | Admitting: Family Medicine

## 2023-12-31 ENCOUNTER — Other Ambulatory Visit

## 2023-12-31 DIAGNOSIS — R748 Abnormal levels of other serum enzymes: Secondary | ICD-10-CM

## 2023-12-31 LAB — COMPREHENSIVE METABOLIC PANEL WITH GFR
ALT: 61 IU/L — ABNORMAL HIGH (ref 0–32)
AST: 24 IU/L (ref 0–40)
Albumin: 4.2 g/dL (ref 3.8–4.8)
Alkaline Phosphatase: 242 IU/L — ABNORMAL HIGH (ref 44–121)
BUN/Creatinine Ratio: 20 (ref 12–28)
BUN: 18 mg/dL (ref 8–27)
Bilirubin Total: 0.3 mg/dL (ref 0.0–1.2)
CO2: 25 mmol/L (ref 20–29)
Calcium: 9.3 mg/dL (ref 8.7–10.3)
Chloride: 103 mmol/L (ref 96–106)
Creatinine, Ser: 0.9 mg/dL (ref 0.57–1.00)
Globulin, Total: 2.2 g/dL (ref 1.5–4.5)
Glucose: 121 mg/dL — ABNORMAL HIGH (ref 70–99)
Potassium: 5.2 mmol/L (ref 3.5–5.2)
Sodium: 141 mmol/L (ref 134–144)
Total Protein: 6.4 g/dL (ref 6.0–8.5)
eGFR: 67 mL/min/{1.73_m2} (ref >59)

## 2023-12-31 LAB — HEPATIC FUNCTION PANEL: Bilirubin, Direct: 0.22 mg/dL (ref 0.00–0.40)

## 2024-01-03 ENCOUNTER — Encounter: Payer: Self-pay | Admitting: Family Medicine

## 2024-01-03 DIAGNOSIS — H5462 Unqualified visual loss, left eye, normal vision right eye: Secondary | ICD-10-CM | POA: Insufficient documentation

## 2024-01-03 DIAGNOSIS — R410 Disorientation, unspecified: Secondary | ICD-10-CM | POA: Insufficient documentation

## 2024-01-03 NOTE — Assessment & Plan Note (Signed)
 Ordering a ct scan of brain for tomorrow.

## 2024-01-03 NOTE — Assessment & Plan Note (Signed)
 Start latuda  20 mg once daily with meal preferably supper x 2 weeks, then increase to 20 mg 2 daily with meal. Continue venlafaxine  er 75 mg once in am.

## 2024-01-03 NOTE — Assessment & Plan Note (Signed)
 Continue aricept  5 mg daily. Rule out stroke.  Ordering a ct scan of brain for tomorrow.

## 2024-01-03 NOTE — Assessment & Plan Note (Addendum)
 Control: controlled. Recommend check sugars fasting daily. Recommend check feet daily. Recommend annual eye exams. Medicines: Farxiga  10 mg daily, metformin  1000 mg. Continue to work on eating a healthy diet and exercise.  Labs drawn today.

## 2024-01-03 NOTE — Assessment & Plan Note (Signed)
 Well controlled. Continue Toprol  xl 25 mg twice daily, valsartan  80 mg daily, and torsemide  20 mg daily. .  Continue to work on eating a healthy diet and exercise.  Labs drawn today

## 2024-01-03 NOTE — Assessment & Plan Note (Signed)
 Decrease trazodone  100 mg 1/2 at night x 1 weeks then stop it. Stop rozerem  one week later.  I am

## 2024-01-03 NOTE — Assessment & Plan Note (Signed)
 The current medical regimen is effective;  continue present plan and medications. Continue Crestor  5 mg daily and omega 3. Continue to work on eating a healthy diet and exercise.  Labs drawn today.

## 2024-01-05 ENCOUNTER — Other Ambulatory Visit: Payer: Self-pay | Admitting: Family Medicine

## 2024-01-05 DIAGNOSIS — I110 Hypertensive heart disease with heart failure: Secondary | ICD-10-CM

## 2024-01-05 DIAGNOSIS — F5101 Primary insomnia: Secondary | ICD-10-CM

## 2024-01-13 ENCOUNTER — Other Ambulatory Visit: Payer: Self-pay | Admitting: Family Medicine

## 2024-01-13 DIAGNOSIS — F5101 Primary insomnia: Secondary | ICD-10-CM

## 2024-01-13 DIAGNOSIS — F314 Bipolar disorder, current episode depressed, severe, without psychotic features: Secondary | ICD-10-CM

## 2024-01-13 DIAGNOSIS — I110 Hypertensive heart disease with heart failure: Secondary | ICD-10-CM

## 2024-01-15 ENCOUNTER — Other Ambulatory Visit: Payer: Self-pay | Admitting: Family Medicine

## 2024-01-15 DIAGNOSIS — F314 Bipolar disorder, current episode depressed, severe, without psychotic features: Secondary | ICD-10-CM

## 2024-01-15 MED ORDER — VENLAFAXINE HCL ER 75 MG PO CP24
75.0000 mg | ORAL_CAPSULE | Freq: Every day | ORAL | 0 refills | Status: DC
Start: 2024-01-15 — End: 2024-05-16

## 2024-01-15 MED ORDER — LURASIDONE HCL 40 MG PO TABS: 40.0000 mg | ORAL_TABLET | Freq: Every day | ORAL | 2 refills | Status: DC

## 2024-01-20 ENCOUNTER — Encounter: Payer: Self-pay | Admitting: Family Medicine

## 2024-01-21 ENCOUNTER — Other Ambulatory Visit: Payer: Self-pay

## 2024-01-21 DIAGNOSIS — E1142 Type 2 diabetes mellitus with diabetic polyneuropathy: Secondary | ICD-10-CM

## 2024-01-21 MED ORDER — METFORMIN HCL 1000 MG PO TABS: 1000.0000 mg | ORAL_TABLET | Freq: Two times a day (BID) | ORAL | 1 refills | Status: DC

## 2024-01-21 MED ORDER — GABAPENTIN 300 MG PO CAPS: 300.0000 mg | ORAL_CAPSULE | Freq: Every day | ORAL | 1 refills | Status: DC

## 2024-01-29 ENCOUNTER — Ambulatory Visit: Admitting: Family Medicine

## 2024-02-11 ENCOUNTER — Other Ambulatory Visit: Payer: Self-pay | Admitting: Family Medicine

## 2024-02-11 ENCOUNTER — Ambulatory Visit: Admitting: Diagnostic Neuroimaging

## 2024-02-11 ENCOUNTER — Encounter: Payer: Self-pay | Admitting: Diagnostic Neuroimaging

## 2024-02-11 VITALS — BP 138/76 | HR 78 | Ht 68.0 in | Wt 168.0 lb

## 2024-02-11 DIAGNOSIS — R251 Tremor, unspecified: Secondary | ICD-10-CM

## 2024-02-11 DIAGNOSIS — R269 Unspecified abnormalities of gait and mobility: Secondary | ICD-10-CM | POA: Diagnosis not present

## 2024-02-11 DIAGNOSIS — M79602 Pain in left arm: Secondary | ICD-10-CM | POA: Diagnosis not present

## 2024-02-11 NOTE — Patient Instructions (Signed)
  GAIT / BALANCE DIFFICULTY (resting tremor, bradykinesia, cogwheeling rigidity) - check CT head and DATscan - use rollator walker; refer to PT evaluation - consider carb/levo medication after testing  LEFT ARM / WRIST INJURY - refer to sports medicine / orthopedic clinic

## 2024-02-11 NOTE — Progress Notes (Signed)
 [facility]  PATIENT: April Munoz DOB: [birth year]  REFERRING CLINICIAN: CoxAbigail, MD  HISTORY FROM: patient and son in law Brad REASON FOR VISIT: new consult   HISTORICAL  CHIEF COMPLAINT:  Chief Complaint  Patient presents with   Gait Problem    RM 7 with son-in law Brad Pt is well, reports she has had impaired gait and weakness in BLE as well as hands for at least 10 yrs.  She did have a fall at least 2 weeks ago.     HISTORY OF PRESENT ILLNESS:   UPDATE (02/11/24, VRP): Since last visit, continues with progressive gait and balance diff. Now with resting tremor and worsening balance and falls. Last major fall 2 weeks ago, injured left arm and left face.  UPDATE (03/14/22, VRP): Since last visit, doing about the same. Continues with insomnia, OSA, memory loss. No major changes in ADLs, except some issues with focusing and driving.   PRIOR HPI ([id number]): 73 year old female here for evaluation of memory loss, balance difficulty, insomnia, anxiety. Patient has hypertension, diabetes, hypercholesterolemia, atrial fibrillation, depression, anxiety, fibromyalgia.  Patient was diagnosed with atrial fibrillation 3 years ago.  She was recommended to start anticoagulation but she has not been able to due to financial limitations.  She does not want to take Coumadin.  She was prescribed Eliquis  but cannot afford it.  Over the past 1 year she has had at least 10-12 episodes of transient slurred speech and trouble talking.  Sometimes these are associated with staggering gait.  Episodes last for few minutes at a time.  She has not gone to the emergency room or had evaluation of these in the past.  Patient also has a gradual onset and progressive generalized balance difficulty, with numbness in her feet.  She feels like her toes are weak.  She has been diagnosed with diabetic peripheral neuropathy.  She has numbness and tingling in her feet.  She has been on gabapentin  in the  past.  Also with chronic anxiety and insomnia since childhood.  She has seen a therapist many years ago but not currently.  She is tried many sleep aids without benefit.   REVIEW OF SYSTEMS: Full 14 system review of systems performed and negative with exception of: as per HPI.   ALLERGIES: Allergies  Allergen Reactions   Atorvastatin      myalgias    HOME MEDICATIONS: Outpatient Medications Prior to Visit  Medication Sig Dispense Refill   B-D 3CC LUER-LOK SYR 25GX1 25G X 1 3 ML MISC USE WITH B12 INJECTIONS 3 each 10   dapagliflozin  propanediol (FARXIGA ) 10 MG TABS tablet TAKE 1 TABLET BY MOUTH ONCE DAILY *REFILL REQUEST* 30 tablet 10   donepezil  (ARICEPT ) 5 MG tablet TAKE 1 TABLET BY MOUTH EVERYDAY AT BEDTIME 90 tablet 0   EPINEPHRINE  0.3 mg/0.3 mL IJ SOAJ injection INJECT 0.3 MG INTRAMUSCULARLY AS NEEDED FOR anaphylaxis 2 each 1   gabapentin  (NEURONTIN ) 300 MG capsule Take 1 capsule (300 mg total) by mouth at bedtime. 90 capsule 1   levothyroxine  (SYNTHROID ) 25 MCG tablet Take 1 tablet (25 mcg total) by mouth daily. 90 tablet 1   lurasidone  (LATUDA ) 40 MG TABS tablet Take 1 tablet (40 mg total) by mouth daily with breakfast. 30 tablet 2   metFORMIN  (GLUCOPHAGE ) 1000 MG tablet Take 1 tablet (1,000 mg total) by mouth 2 (two) times daily with a meal. 180 tablet 1   metoprolol  succinate (TOPROL  XL) 50 MG 24 hr tablet Take 1 tablet (  50 mg total) by mouth at bedtime. Take with or immediately following a meal. 90 tablet 3   omega-3 acid ethyl esters (LOVAZA ) 1 g capsule Take 2 capsules by mouth 2 (two) times daily.     rivaroxaban  (XARELTO ) 20 MG TABS tablet Take 1 tablet (20 mg total) by mouth daily with supper. 30 tablet 3   Syringe/Needle, Disp, (SYRINGE 3CC/27GX1-1/4) 27G X 1-1/4 3 ML MISC 1 each by Does not apply route every 30 (thirty) days. 100 each 0   torsemide  (DEMADEX ) 20 MG tablet Take 1 tablet (20 mg total) by mouth 3 (three) times a week. 90 tablet 1   valsartan  (DIOVAN ) 80  MG tablet TAKE 1 TABLET BY MOUTH EVERY DAY 90 tablet 0   venlafaxine  XR (EFFEXOR -XR) 75 MG 24 hr capsule Take 1 capsule (75 mg total) by mouth daily with breakfast. 90 capsule 0   Vitamin D , Ergocalciferol , (DRISDOL ) 1.25 MG (50000 UNIT) CAPS capsule Take 50,000 Units by mouth once a week.     OLANZapine  (ZYPREXA ) 5 MG tablet Take 5 mg by mouth daily. (Patient not taking: Reported on 02/11/2024)     rosuvastatin  (CRESTOR ) 5 MG tablet TAKE 1 TABLET BY MOUTH ONCE DAILY (Patient not taking: Reported on 02/11/2024) 90 tablet 0   Vibegron (GEMTESA) 75 MG TABS Take by mouth. (Patient not taking: Reported on 02/11/2024)     acyclovir  (ZOVIRAX ) 400 MG tablet TAKE 1 TABLET BY MOUTH 5 TIMES DAILY FOR 7 DAYS (Patient not taking: Reported on 02/11/2024) 35 tablet 10   No facility-administered medications prior to visit.    PAST MEDICAL HISTORY: Past Medical History:  Diagnosis Date   Anxiety    Arthritis    Ataxic gait    Atrial fibrillation (HCC)    Atrophy of thyroid     B12 deficiency 10/31/2017   Cellulitis of abdominal wall 07/07/2016   Chronic anticoagulation 05/31/2018   Depression    Depression, major, recurrent, mild (HCC) 09/16/2019   Diabetes mellitus without complication (HCC)    Diabetic glomerulopathy (HCC) 09/16/2019   Esophageal stricture 06/05/2017   Essential hypertension 02/24/2013   Fatigue 01/20/2020   Fibromyalgia 02/24/2013   Full dentures    Hereditary and idiopathic peripheral neuropathy 02/24/2013   High risk medication use 11/07/2017   Hyperlipidemia 02/24/2013   Hypertension    Hypertensive heart disease 02/24/2013   Incontinence    Insomnia    Longstanding persistent atrial fibrillation (HCC) 09/16/2019   Mild vitamin D  deficiency 09/16/2019   Mixed hyperlipidemia 02/24/2013   Murmur, cardiac 08/15/2015   Neuropathy in diabetes (HCC) 10/31/2017   Osteoporosis    Other amnesia    Other amnesia    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    Paroxysmal  atrial fibrillation (HCC) 08/15/2015   CHADS2vasc=3 CHADS2vasc=3   Primary insomnia    Restless legs syndrome 03/14/2013   Secondary hypothyroidism 09/16/2019   Thoracic or lumbosacral neuritis or radiculitis 02/24/2013   Type 2 diabetes mellitus, without long-term current use of insulin (HCC) 02/24/2013   Urge incontinence of urine 09/16/2019   Wears glasses     PAST SURGICAL HISTORY: Past Surgical History:  Procedure Laterality Date   A-FLUTTER ABLATION N/A 07/30/2019   Procedure: A-FLUTTER ABLATION;  Surgeon: Inocencio Soyla Lunger, MD;  Location: [facility];  Service: Cardiovascular;  Laterality: N/A;   ATRIAL FIBRILLATION ABLATION N/A 03/21/2019   Procedure: ATRIAL FIBRILLATION ABLATION;  Surgeon: Inocencio Soyla Lunger, MD;  Location: [facility];  Service: Cardiovascular;  Laterality: N/A;   ATRIAL FIBRILLATION ABLATION N/A 10/27/2021   Procedure: ATRIAL FIBRILLATION ABLATION;  Surgeon: Inocencio Soyla Lunger, MD;  Location: [facility];  Service: Cardiovascular;  Laterality: N/A;   BREAST REDUCTION SURGERY  1983   BUBBLE STUDY  11/26/2020   Procedure: BUBBLE STUDY;  Surgeon: Mona Vinie BROCKS, MD;  Location: [facility];  Service: Cardiovascular;;   CARDIOVERSION N/A 11/26/2020   Procedure: CARDIOVERSION;  Surgeon: Mona Vinie BROCKS, MD;  Location: [facility] ENDOSCOPY;  Service: Cardiovascular;  Laterality: N/A;   CATARACT EXTRACTION, BILATERAL  02-26-17, Feb 26, 2018   CHOLECYSTECTOMY  1971   open   DIAGNOSTIC LAPAROSCOPY  2010   lysis of adhesions   DILATION AND CURETTAGE OF UTERUS  Feb 27, 2003   KNEE ARTHROSCOPY  1998   left   KNEE ARTHROSCOPY W/ LATERAL RETINACULAR REPAIR     MASS EXCISION Left 11/11/2013   Procedure: EXCISION MUCOID CYST LEFT INDEX FINGER/DEBRIDEMENT LEFT INDEX FINGER;  Surgeon: Arley JONELLE Curia, MD;  Location: [facility];  Service: Orthopedics;  Laterality: Left;  ANESTHESIA: IV REGIONAL/FAB   SHOULDER ARTHROSCOPY W/ ROTATOR CUFF REPAIR  02/26/2006   left   TEE WITHOUT  CARDIOVERSION N/A 11/26/2020   Procedure: TRANSESOPHAGEAL ECHOCARDIOGRAM (TEE);  Surgeon: Mona Vinie BROCKS, MD;  Location: [facility] ENDOSCOPY;  Service: Cardiovascular;  Laterality: N/A;   TRIGGER FINGER RELEASE Left 11/11/2013   Procedure: RELEASE A-1 PULLEY LEFT RING FINGER;  Surgeon: Arley JONELLE Curia, MD;  Location: [facility];  Service: Orthopedics;  Laterality: Left;   UMBILICAL HERNIA REPAIR  2008, 2010    FAMILY HISTORY: Family History  Problem Relation Age of Onset   Atrial fibrillation Mother    Heart attack Mother    Heart failure Father    Heart disease Brother    Heart attack Brother    Bladder Cancer Brother    Diabetes Maternal Grandmother    Heart failure Paternal Grandfather    Stroke Paternal Grandfather     SOCIAL HISTORY: Social History   Socioeconomic History   Marital status: Widowed    Spouse name: Dick   Number of children: 2   Years of education: 14   Highest education level: Some college, no degree  Occupational History   Occupation: retired  Tobacco Use   Smoking status: Never   Smokeless tobacco: Never  Vaping Use   Vaping status: Never Used  Substance and Sexual Activity   Alcohol use: Yes    Alcohol/week: 1.0 - 2.0 standard drink of alcohol    Types: 1 - 2 Glasses of wine per week    Comment: 4 times a month   Drug use: No   Sexual activity: Not Currently  Other Topics Concern   Not on file  Social History Narrative   Husband passed away February 26, 2022 - one daughter that lives in [place] (adopted) and one son (adopted)   Left handed   Caffeine: caffeine free drinks. 2 cup of coffee a day   Lives with friends    Social Drivers of Health   Financial Resource Strain: High Risk (02/28/2023)   Overall Financial Resource Strain (CARDIA)    Difficulty of Paying Living Expenses: Very hard  Food Insecurity: No Food Insecurity (12/26/2023)   Hunger Vital Sign    Worried About Running Out of Food in the Last Year: Never true    Ran Out of Food in the  Last Year: Never true  Transportation Needs: No Transportation Needs (12/26/2023)   PRAPARE - Transportation  Lack of Transportation (Medical): No    Lack of Transportation (Non-Medical): No  Physical Activity: Inactive (02/28/2023)   Exercise Vital Sign    Days of Exercise per Week: 0 days    Minutes of Exercise per Session: 0 min  Stress: No Stress Concern Present (02/28/2023)   [employer] of Occupational Health - Occupational Stress Questionnaire    Feeling of Stress : Only a little  Social Connections: Socially Isolated (03/08/2023)   Social Connection and Isolation Panel    Frequency of Communication with Friends and Family: More than three times a week    Frequency of Social Gatherings with Friends and Family: More than three times a week    Attends Religious Services: Never    Database administrator or Organizations: No    Attends Banker Meetings: Not on file    Marital Status: Widowed  Intimate Partner Violence: Not At Risk (12/26/2023)   Humiliation, Afraid, Rape, and Kick questionnaire    Fear of Current or Ex-Partner: No    Emotionally Abused: No    Physically Abused: No    Sexually Abused: No     PHYSICAL EXAM  GENERAL EXAM/CONSTITUTIONAL: Vitals:  Vitals:   02/11/24 1134  BP: 138/76  Pulse: 78  Weight: 168 lb (76.2 kg)  Height: 5' 8 (1.727 m)   Body mass index is 25.54 kg/m. Wt Readings from Last 3 Encounters:  02/11/24 168 lb (76.2 kg)  12/26/23 175 lb (79.4 kg)  11/19/23 171 lb 6.4 oz (77.7 kg)   Patient is in no distress; well developed, nourished and groomed; neck is supple  CARDIOVASCULAR: Examination of carotid arteries is normal; no carotid bruits Regular rate and rhythm, no murmurs Examination of peripheral vascular system by observation and palpation is normal  EYES: Ophthalmoscopic exam of optic discs and posterior segments is normal; no papilledema or hemorrhages No results found.   MUSCULOSKELETAL: Gait, strength,  tone, movements noted in Neurologic exam below  NEUROLOGIC: MENTAL STATUS:     02/20/2022    4:12 PM 02/08/2022    8:55 AM  MMSE - Mini Mental State Exam  Orientation to time 3 3  Orientation to Place 5 4  Registration 3 3  Attention/ Calculation 3 5  Recall 2 3  Language- name 2 objects 2 2  Language- repeat 1 1  Language- follow 3 step command 3 3  Language- read & follow direction 1 1  Write a sentence 1 1  Copy design 1 1  Total score 25 27   awake, alert, oriented to person, place and time recent and remote memory intact normal attention and concentration language fluent, comprehension intact, naming intact fund of knowledge appropriate  CRANIAL NERVE:  2nd - no papilledema on fundoscopic exam 2nd, 3rd, 4th, 6th - pupils equal and reactive to light, visual fields full to confrontation, extraocular muscles intact, no nystagmus 5th - facial sensation symmetric 7th - facial strength symmetric 8th - hearing intact 9th - palate elevates symmetrically, uvula midline 11th - shoulder shrug symmetric 12th - tongue protrusion midline  MOTOR:  INCREASED TONE IN BUE (COGWHEELING RIGIDITY) RESTING TREMOR IN BUE, FEET AND MOUTH BRADYKINESIA IN LUE > RUE; ALSO IN BLE DIFFUSE 4+ STRENGTH, EXCEPT LUE 3-4 LIMITED BY PAIN  SENSORY:  normal and symmetric to light touch; ABSENT VIB AT ANKLES / FEET  COORDINATION:  finger-nose-finger, fine finger movements SLOW  REFLEXES:  deep tendon reflexes TRACE and symmetric; ABSENT AT ANKLES  GAIT/STATION:  narrow based gait; SHORT  STEPS; VEERING TO LEFT     DIAGNOSTIC DATA (LABS, IMAGING, TESTING) - I reviewed patient records, labs, notes, testing and imaging myself where available.  Lab Results  Component Value Date   WBC 5.6 12/26/2023   HGB 12.0 12/26/2023   HCT 37.5 12/26/2023   MCV 95 12/26/2023   PLT 321 12/26/2023      Component Value Date/Time   NA 141 12/31/2023 1448   K 5.2 12/31/2023 1448   CL 103 12/31/2023  1448   CO2 25 12/31/2023 1448   GLUCOSE 121 (H) 12/31/2023 1448   GLUCOSE 130 (H) 11/26/2020 0926   BUN 18 12/31/2023 1448   CREATININE 0.90 12/31/2023 1448   CALCIUM  9.3 12/31/2023 1448   PROT 6.4 12/31/2023 1448   ALBUMIN 4.2 12/31/2023 1448   AST 24 12/31/2023 1448   ALT 61 (H) 12/31/2023 1448   ALKPHOS 242 (H) 12/31/2023 1448   BILITOT 0.3 12/31/2023 1448   GFRNONAA 53 (L) 09/03/2020 0953   GFRAA 61 09/03/2020 0953   Lab Results  Component Value Date   CHOL 139 12/26/2023   HDL 81 12/26/2023   LDLCALC 39 12/26/2023   TRIG 111 12/26/2023   CHOLHDL 1.7 12/26/2023   Lab Results  Component Value Date   HGBA1C 6.2 (H) 12/26/2023   Lab Results  Component Value Date   VITAMINB12 427 07/12/2023   Lab Results  Component Value Date   TSH 2.710 09/26/2023     08/02/18 MRI brain [I reviewed images myself and agree with interpretation. -VRP]  - moderate atrophy - moderate chronic small vessel ischemic disease   02/21/22 MRI brain 1. No acute intracranial process. 2. Advanced cerebral volume loss for age with sequela of severe chronic small vessel ischemic disease. No disproportionate lobar atrophy. 3. Narrowing of the right cavernous ICA flow void concerning for moderate to severe stenosis. Consider a CTA head and neck for further evaluation.  03/03/22 CTA head / neck 1. Mild for age carotid atherosclerosis. No stenosis at the right cavernous ICA which was highlighted on prior brain MRI. 2. There is high-grade narrowing at the proximal right PCA. 3. Advanced chronic small vessel ischemia. 4. 7 mm colloid cyst.    ASSESSMENT AND PLAN  73 y.o. year old female here with:  Dx:  1. Tremor   2. Gait difficulty   3. Left arm pain       PLAN:  GAIT / BALANCE DIFFICULTY (resting tremor, bradykinesia, cogwheeling rigidity) - check CT head and DATscan - use rollator walker; refer to PT evaluation - consider carb/levo medication after testing  LEFT ARM / WRIST  INJURY - refer to sports medicine / orthopedic clinic  Orders Placed This Encounter  Procedures   CT HEAD WO CONTRAST ( )   NM BRAIN DATSCAN TUMOR LOC INFLAM SPECT 1 DAY   AMB referral to sports medicine   Return in about 3 months (around 05/13/2024).    EDUARD FABIENE HANLON, MD 02/11/2024, 12:56 PM Certified in Neurology, Neurophysiology and Neuroimaging  [facility] [address] [place], [place] [address] [phone]

## 2024-02-12 ENCOUNTER — Telehealth: Payer: Self-pay | Admitting: Diagnostic Neuroimaging

## 2024-02-12 NOTE — Telephone Encounter (Signed)
 Referral for sports medicine fax to [facility] and [facility] Medicine. Phone: [phone], Fax: [phone]

## 2024-02-18 ENCOUNTER — Other Ambulatory Visit: Payer: Self-pay | Admitting: Family Medicine

## 2024-02-27 NOTE — Progress Notes (Signed)
   02/27/2024  Patient ID: April Munoz, female   DOB: July 22, 1950, 73 y.o.   MRN: [id number]  Pharmacy Quality Measure Review  This patient is appearing on a report for being at risk of failing the adherence measure for cholesterol (statin) medications this calendar year.   Medication: rosuvastatin  last filled 30 ds 6/5 however has been filled on time previously and currently fills at [facility] pharmacy so reporting is delayed.    Insurance report was not up to date. No action needed at this time.   Lang Sieve, PharmD, BCGP Clinical Pharmacist  [phone]

## 2024-03-04 ENCOUNTER — Other Ambulatory Visit: Payer: Self-pay | Admitting: Family Medicine

## 2024-03-04 DIAGNOSIS — F5101 Primary insomnia: Secondary | ICD-10-CM

## 2024-03-05 ENCOUNTER — Encounter (HOSPITAL_COMMUNITY): Payer: Self-pay

## 2024-03-05 ENCOUNTER — Ambulatory Visit (HOSPITAL_COMMUNITY)
Admission: RE | Admit: 2024-03-05 | Discharge: 2024-03-05 | Disposition: A | Discharge status: Home / Self Care | Source: Ambulatory Visit | Attending: Diagnostic Neuroimaging | Admitting: Diagnostic Neuroimaging

## 2024-03-05 ENCOUNTER — Encounter (HOSPITAL_COMMUNITY)
Admission: RE | Admit: 2024-03-05 | Discharge: 2024-03-05 | Disposition: A | Discharge status: Home / Self Care | Source: Ambulatory Visit | Attending: Diagnostic Neuroimaging | Admitting: Diagnostic Neuroimaging

## 2024-03-05 DIAGNOSIS — R251 Tremor, unspecified: Secondary | ICD-10-CM | POA: Diagnosis not present

## 2024-03-05 DIAGNOSIS — R269 Unspecified abnormalities of gait and mobility: Secondary | ICD-10-CM

## 2024-03-05 DIAGNOSIS — G20C Parkinsonism, unspecified: Secondary | ICD-10-CM | POA: Diagnosis not present

## 2024-03-05 DIAGNOSIS — I6782 Cerebral ischemia: Secondary | ICD-10-CM | POA: Diagnosis not present

## 2024-03-05 MED ORDER — IOFLUPANE I 123 185 MBQ/2.5ML IV SOLN
4.7200 | Freq: Once | INTRAVENOUS | Status: AC
  Administered 2024-03-05: 4.72 via INTRAVENOUS
  Filled 2024-03-05: qty 5

## 2024-03-05 MED ORDER — POTASSIUM IODIDE (ANTIDOTE) 130 MG PO TABS
ORAL_TABLET | ORAL | Status: AC
  Filled 2024-03-05: qty 1

## 2024-03-06 ENCOUNTER — Ambulatory Visit: Payer: Self-pay | Admitting: Diagnostic Neuroimaging

## 2024-03-06 ENCOUNTER — Ambulatory Visit

## 2024-03-11 ENCOUNTER — Ambulatory Visit: Admitting: Family Medicine

## 2024-03-11 MED ORDER — CARBIDOPA-LEVODOPA 25-100 MG PO TABS
1.0000 | ORAL_TABLET | Freq: Three times a day (TID) | ORAL | 6 refills | Status: DC
Start: 2024-03-11 — End: 2024-05-12

## 2024-03-17 ENCOUNTER — Other Ambulatory Visit: Payer: Self-pay | Admitting: Family Medicine

## 2024-03-17 DIAGNOSIS — F5101 Primary insomnia: Secondary | ICD-10-CM

## 2024-03-31 ENCOUNTER — Encounter: Payer: Self-pay | Admitting: Family Medicine

## 2024-03-31 ENCOUNTER — Ambulatory Visit (INDEPENDENT_AMBULATORY_CARE_PROVIDER_SITE_OTHER): Admitting: Family Medicine

## 2024-03-31 VITALS — BP 124/64 | HR 86 | Temp 98.2°F | Ht 68.0 in | Wt 164.0 lb

## 2024-03-31 DIAGNOSIS — E039 Hypothyroidism, unspecified: Secondary | ICD-10-CM

## 2024-03-31 DIAGNOSIS — F5101 Primary insomnia: Secondary | ICD-10-CM

## 2024-03-31 DIAGNOSIS — M25532 Pain in left wrist: Secondary | ICD-10-CM | POA: Diagnosis not present

## 2024-03-31 DIAGNOSIS — Z7984 Long term (current) use of oral hypoglycemic drugs: Secondary | ICD-10-CM | POA: Diagnosis not present

## 2024-03-31 DIAGNOSIS — N3941 Urge incontinence: Secondary | ICD-10-CM

## 2024-03-31 DIAGNOSIS — I4811 Longstanding persistent atrial fibrillation: Secondary | ICD-10-CM

## 2024-03-31 DIAGNOSIS — F314 Bipolar disorder, current episode depressed, severe, without psychotic features: Secondary | ICD-10-CM | POA: Diagnosis not present

## 2024-03-31 DIAGNOSIS — E782 Mixed hyperlipidemia: Secondary | ICD-10-CM

## 2024-03-31 DIAGNOSIS — E1142 Type 2 diabetes mellitus with diabetic polyneuropathy: Secondary | ICD-10-CM

## 2024-03-31 DIAGNOSIS — R251 Tremor, unspecified: Secondary | ICD-10-CM

## 2024-03-31 DIAGNOSIS — E559 Vitamin D deficiency, unspecified: Secondary | ICD-10-CM

## 2024-03-31 DIAGNOSIS — Z23 Encounter for immunization: Secondary | ICD-10-CM

## 2024-03-31 LAB — POCT LIPID PANEL
HDL: 90
LDL: 113
Non-HDL: 140
TC: 230
TRG: 138

## 2024-03-31 LAB — POCT GLYCOSYLATED HEMOGLOBIN (HGB A1C): HbA1c POC (<> result, manual entry): 5.6 % (ref 4.0–5.6)

## 2024-03-31 MED ORDER — BELSOMRA 10 MG PO TABS: 10.0000 mg | ORAL_TABLET | Freq: Every evening | ORAL | 2 refills | Status: DC | PRN

## 2024-03-31 MED ORDER — VITAMIN D (ERGOCALCIFEROL) 1.25 MG (50000 UNIT) PO CAPS: 50000.0000 [IU] | ORAL_CAPSULE | ORAL | 1 refills | Status: DC

## 2024-03-31 MED ORDER — MIRABEGRON ER 25 MG PO TB24: 25.0000 mg | ORAL_TABLET | Freq: Every day | ORAL | Status: DC

## 2024-03-31 MED ORDER — LURASIDONE HCL 80 MG PO TABS: 80.0000 mg | ORAL_TABLET | Freq: Every day | ORAL | 0 refills | Status: DC

## 2024-03-31 MED ORDER — OMEGA-3-ACID ETHYL ESTERS 1 G PO CAPS: 2.0000 | ORAL_CAPSULE | Freq: Two times a day (BID) | ORAL | 1 refills | Status: DC

## 2024-03-31 MED ORDER — ROSUVASTATIN CALCIUM 5 MG PO TABS: 5.0000 mg | ORAL_TABLET | Freq: Every day | ORAL | 0 refills | Status: DC

## 2024-03-31 NOTE — Progress Notes (Signed)
 Subjective:  Patient ID: Rock Chandler, female    DOB: [birth year]  Age: 73 y.o. MRN: [id number]  Chief Complaint  Patient presents with   Medical Management of Chronic Issues    Having difficulty sleeping. Feels anxious.   Discussed the use of AI scribe software for clinical note transcription with the patient, who gave verbal consent to proceed.  History of Present Illness   Zerina Hallinan is a 73 year old female who presents for a three-month follow-up for depression and tremors.  Depressive and anxiety symptoms - Significant sleep disturbances, including inability to sleep at all and no sleep the previous night - Ongoing insomnia with request for assistance with sleep and daytime anxiety management - Current use of Latuda  for mood stabilization, perceived as ineffective for depression or anxiety - History of trazodone  and Rozerem  use, both discontinued - Lack of drive and desire, worsened by recent loss of her 71 year old dog  Tremors and extrapyramidal symptoms - Tremors predominantly in the left arm, onset approximately three months ago with progressive worsening - Evaluation by neurology and DAT scan completed - Current use of carbidopa -levodopa  three times daily, with only transient benefit - Nicotine patches applied by her caretaker to help with tremors. It may be helping with tremors  Gait instability and falls - Two falls since last visit - Most recent fall resulted in swollen and painful left wrist after catching herself with her hand - This was about 4-6 weeks ago. She had refused to seek medical care at that time.  - Falls described as sudden and without apparent cause - No medical evaluation sought for wrist injury  Urinary incontinence - Urinary urgency with inability to reach the bathroom in time, resulting in accidents - Previous evaluation by urology and trial of Gemtesa without symptom improvement - Use of incontinence pads - No follow-up with urology since  medication trial  Cognitive and neurological symptoms - Occasional dizziness, including episodes while standing still (lightheaded) - Intermittent confusion, particularly with memory for tasks or conversations - No vertigo, headaches, or significant muscle or joint pain  Diabetes mellitus - Managed with metformin  1000 mg twice daily.and Farxiga  - Recent blood glucose measured at 126 mg/dL  Cardiovascular and metabolic management - Current medications include  Hyperlipidemia: unclear if taking Crestor  5 mg before bed,taking Lovaza  1 gm 2 capsules.    Hypertension/Atrial Fibrillation: valsartan  80 mg daily, metoprolol  xl 50 mg daily, and Xarelto       03/31/2024    4:17 PM 12/26/2023    3:01 PM 09/26/2023    9:51 AM 07/12/2023    8:55 AM 02/28/2023   11:05 AM  Depression screen PHQ 2/9  Decreased Interest 3 3 3 1 1   Down, Depressed, Hopeless 3 3 3 2 3   PHQ - 2 Score 6 6 6 3 4   Altered sleeping 3 3 3 3 2   Tired, decreased energy 3 3 3 3 3   Change in appetite 3 0 2 0 3  Feeling bad or failure about yourself  0 0 3 0 2  Trouble concentrating 0 2 0 0 3  Moving slowly or fidgety/restless 1 0 3 1 2   Suicidal thoughts 0 0 0 0 0  PHQ-9 Score 16 14 20 10 19   Difficult doing work/chores Somewhat difficult Not difficult at all Very difficult Not difficult at all Very difficult        03/31/2024    3:19 PM  Fall Risk   Falls in the past year? 1  Number  falls in past yr: 0  Injury with Fall? 0  Risk for fall due to : No Fall Risks  Follow up Falls evaluation completed    Patient Care Team: Sherre Clapper, MD as PCP - General (Family Medicine) Monetta Redell PARAS, MD as PCP - Cardiology (Cardiology) Inocencio Soyla Lunger, MD as PCP - Electrophysiology (Cardiology) Monetta Redell PARAS, MD as Consulting Physician (Cardiology) Misenheimer, Evalene, MD as Consulting Physician (Unknown Physician Specialty) Joesph Lynwood DEL, MD as Consulting Physician (General Surgery) Beverli Cara PARAS, [facility] (Inactive)  (Pharmacist)   Review of Systems  Constitutional:  Positive for fatigue. Negative for chills and fever.  HENT:  Positive for ear pain (right). Negative for congestion and sore throat.   Respiratory:  Negative for cough and shortness of breath.   Cardiovascular:  Negative for chest pain.  Gastrointestinal:  Negative for abdominal pain, constipation, diarrhea, nausea and vomiting.  Genitourinary:  Negative for dysuria and urgency.       Urge incontinence.  Sometimes doesn't even get the urge and she can lose control of her bladder.  Wears adult diapers.  Previously saw urologist. Odette was normal.    Musculoskeletal:  Negative for arthralgias and myalgias.  Skin:  Negative for rash.  Neurological:  Positive for light-headedness. Negative for dizziness and headaches.  Psychiatric/Behavioral:  Positive for agitation, confusion, dysphoric mood and sleep disturbance. Negative for suicidal ideas. The patient is nervous/anxious.     Current Outpatient Medications on File Prior to Visit  Medication Sig Dispense Refill   B-D 3CC LUER-LOK SYR 25GX1 25G X 1 3 ML MISC USE WITH B12 INJECTIONS 3 each 10   carbidopa -levodopa  (SINEMET  IR) 25-100 MG tablet Take 1 tablet by mouth 3 (three) times daily before meals. 90 tablet 6   donepezil  (ARICEPT ) 5 MG tablet TAKE 1 TABLET BY MOUTH EVERYDAY AT BEDTIME 90 tablet 0   EPINEPHRINE  0.3 mg/0.3 mL IJ SOAJ injection INJECT 0.3 MG INTRAMUSCULARLY AS NEEDED FOR anaphylaxis 2 each 1   levothyroxine  (SYNTHROID ) 25 MCG tablet Take 1 tablet (25 mcg total) by mouth daily. 90 tablet 1   metFORMIN  (GLUCOPHAGE ) 1000 MG tablet Take 1 tablet (1,000 mg total) by mouth 2 (two) times daily with a meal. 180 tablet 1   metoprolol  succinate (TOPROL  XL) 50 MG 24 hr tablet Take 1 tablet (50 mg total) by mouth at bedtime. Take with or immediately following a meal. 90 tablet 3   rivaroxaban  (XARELTO ) 20 MG TABS tablet TAKE 1 TABLET BY MOUTH DAILY WITH SUPPER. 90 tablet 1    Syringe/Needle, Disp, (SYRINGE 3CC/27GX1-1/4) 27G X 1-1/4 3 ML MISC 1 each by Does not apply route every 30 (thirty) days. 100 each 0   torsemide  (DEMADEX ) 20 MG tablet Take 1 tablet (20 mg total) by mouth 3 (three) times a week. 90 tablet 1   valsartan  (DIOVAN ) 80 MG tablet TAKE 1 TABLET BY MOUTH EVERY DAY 90 tablet 0   venlafaxine  XR (EFFEXOR -XR) 75 MG 24 hr capsule Take 1 capsule (75 mg total) by mouth daily with breakfast. 90 capsule 0   No current facility-administered medications on file prior to visit.   Past Medical History:  Diagnosis Date   Anxiety    Arthritis    Ataxic gait    Atrial fibrillation (HCC)    Atrophy of thyroid     B12 deficiency 10/31/2017   Cellulitis of abdominal wall 07/07/2016   Chronic anticoagulation 05/31/2018   Depression    Depression, major, recurrent, mild (HCC) 09/16/2019   Diabetes  mellitus without complication (HCC)    Diabetic glomerulopathy (HCC) 09/16/2019   Esophageal stricture 06/05/2017   Essential hypertension 02/24/2013   Fatigue 01/20/2020   Fibromyalgia 02/24/2013   Full dentures    Hereditary and idiopathic peripheral neuropathy 02/24/2013   High risk medication use 11/07/2017   Hyperlipidemia 02/24/2013   Hypertension    Hypertensive heart disease 02/24/2013   Incontinence    Insomnia    Longstanding persistent atrial fibrillation (HCC) 09/16/2019   Mild vitamin D  deficiency 09/16/2019   Mixed hyperlipidemia 02/24/2013   Murmur, cardiac 08/15/2015   Neuropathy in diabetes (HCC) 10/31/2017   Osteoporosis    Other amnesia    Other amnesia    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    Paroxysmal atrial fibrillation (HCC) 08/15/2015   CHADS2vasc=3 CHADS2vasc=3   Primary insomnia    Restless legs syndrome 03/14/2013   Secondary hypothyroidism 09/16/2019   Thoracic or lumbosacral neuritis or radiculitis 02/24/2013   Type 2 diabetes mellitus, without long-term current use of insulin (HCC) 02/24/2013   Urge incontinence of  urine 09/16/2019   Wears glasses    Past Surgical History:  Procedure Laterality Date   A-FLUTTER ABLATION N/A 07/30/2019   Procedure: A-FLUTTER ABLATION;  Surgeon: Inocencio Soyla Lunger, MD;  Location: [facility];  Service: Cardiovascular;  Laterality: N/A;   ATRIAL FIBRILLATION ABLATION N/A 03/21/2019   Procedure: ATRIAL FIBRILLATION ABLATION;  Surgeon: Inocencio Soyla Lunger, MD;  Location: [facility];  Service: Cardiovascular;  Laterality: N/A;   ATRIAL FIBRILLATION ABLATION N/A 10/27/2021   Procedure: ATRIAL FIBRILLATION ABLATION;  Surgeon: Inocencio Soyla Lunger, MD;  Location: [facility];  Service: Cardiovascular;  Laterality: N/A;   BREAST REDUCTION SURGERY  1983   BUBBLE STUDY  11/26/2020   Procedure: BUBBLE STUDY;  Surgeon: Mona Vinie BROCKS, MD;  Location: [facility] ENDOSCOPY;  Service: Cardiovascular;;   CARDIOVERSION N/A 11/26/2020   Procedure: CARDIOVERSION;  Surgeon: Mona Vinie BROCKS, MD;  Location: [facility] ENDOSCOPY;  Service: Cardiovascular;  Laterality: N/A;   CATARACT EXTRACTION, BILATERAL  2018, 2019   CHOLECYSTECTOMY  1971   open   DIAGNOSTIC LAPAROSCOPY  2010   lysis of adhesions   DILATION AND CURETTAGE OF UTERUS  2004   KNEE ARTHROSCOPY  1998   left   KNEE ARTHROSCOPY W/ LATERAL RETINACULAR REPAIR     MASS EXCISION Left 11/11/2013   Procedure: EXCISION MUCOID CYST LEFT INDEX FINGER/DEBRIDEMENT LEFT INDEX FINGER;  Surgeon: Arley JONELLE Curia, MD;  Location: [facility];  Service: Orthopedics;  Laterality: Left;  ANESTHESIA: IV REGIONAL/FAB   SHOULDER ARTHROSCOPY W/ ROTATOR CUFF REPAIR  2007   left   TEE WITHOUT CARDIOVERSION N/A 11/26/2020   Procedure: TRANSESOPHAGEAL ECHOCARDIOGRAM (TEE);  Surgeon: Mona Vinie BROCKS, MD;  Location: [facility] ENDOSCOPY;  Service: Cardiovascular;  Laterality: N/A;   TRIGGER FINGER RELEASE Left 11/11/2013   Procedure: RELEASE A-1 PULLEY LEFT RING FINGER;  Surgeon: Arley JONELLE Curia, MD;  Location: [facility];  Service:  Orthopedics;  Laterality: Left;   UMBILICAL HERNIA REPAIR  2008, 2010    Family History  Problem Relation Age of Onset   Atrial fibrillation Mother    Heart attack Mother    Heart failure Father    Heart disease Brother    Heart attack Brother    Bladder Cancer Brother    Diabetes Maternal Grandmother    Heart failure Paternal Grandfather    Stroke Paternal Grandfather    Social History   Socioeconomic  History   Marital status: Widowed    Spouse name: Dick   Number of children: 2   Years of education: 14   Highest education level: Some college, no degree  Occupational History   Occupation: retired  Tobacco Use   Smoking status: Never   Smokeless tobacco: Never  Vaping Use   Vaping status: Never Used  Substance and Sexual Activity   Alcohol use: Yes    Alcohol/week: 1.0 - 2.0 standard drink of alcohol    Types: 1 - 2 Glasses of wine per week    Comment: 4 times a month   Drug use: No   Sexual activity: Not Currently  Other Topics Concern   Not on file  Social History Narrative   Husband passed away 20-Apr-2022 - one daughter that lives in [place] (adopted) and one son (adopted)   Left handed   Caffeine: caffeine free drinks. 2 cup of coffee a day   Lives with friends    Social Drivers of Health   Financial Resource Strain: High Risk (02/28/2023)   Overall Financial Resource Strain (CARDIA)    Difficulty of Paying Living Expenses: Very hard  Food Insecurity: No Food Insecurity (12/26/2023)   Hunger Vital Sign    Worried About Running Out of Food in the Last Year: Never true    Ran Out of Food in the Last Year: Never true  Transportation Needs: No Transportation Needs (12/26/2023)   PRAPARE - Administrator, Civil Service (Medical): No    Lack of Transportation (Non-Medical): No  Physical Activity: Inactive (02/28/2023)   Exercise Vital Sign    Days of Exercise per Week: 0 days    Minutes of Exercise per Session: 0 min  Stress: No Stress Concern Present  (02/28/2023)   [employer] of Occupational Health - Occupational Stress Questionnaire    Feeling of Stress : Only a little  Social Connections: Socially Isolated (03/08/2023)   Social Connection and Isolation Panel    Frequency of Communication with Friends and Family: More than three times a week    Frequency of Social Gatherings with Friends and Family: More than three times a week    Attends Religious Services: Never    Database administrator or Organizations: No    Attends Banker Meetings: Not on file    Marital Status: Widowed    Objective:  BP 124/64   Pulse 86   Temp 98.2 F (36.8 C)   Ht 5' 8 (1.727 m)   Wt 164 lb (74.4 kg)   SpO2 98%   BMI 24.94 kg/m      03/31/2024    3:17 PM 02/11/2024   11:34 AM 12/26/2023    2:41 PM  BP/Weight  Systolic BP 124 138 136  Diastolic BP 64 76 70  Wt. (Lbs) 164 168 175  BMI 24.94 kg/m2 25.54 kg/m2 27.41 kg/m2    Physical Exam Vitals reviewed.  Constitutional:      Appearance: Normal appearance. She is normal weight.  HENT:     Right Ear: Tympanic membrane normal.     Left Ear: Tympanic membrane normal.     Nose: Nose normal.     Mouth/Throat:     Pharynx: No oropharyngeal exudate or posterior oropharyngeal erythema.  Neck:     Vascular: No carotid bruit.  Cardiovascular:     Rate and Rhythm: Normal rate and regular rhythm.     Pulses: Normal pulses.     Heart sounds: Normal heart  sounds.  Pulmonary:     Effort: Pulmonary effort is normal. No respiratory distress.     Breath sounds: Normal breath sounds.  Abdominal:     General: Abdomen is flat. Bowel sounds are normal.     Palpations: Abdomen is soft.     Tenderness: There is no abdominal tenderness.  Musculoskeletal:     Right wrist: Normal.     Left wrist: Swelling and tenderness present.  Neurological:     Mental Status: She is alert and oriented to person, place, and time.     Cranial Nerves: Cranial nerves 2-12 are intact.     Motor: Tremor  present.     Coordination: Romberg sign negative. Coordination normal.     Gait: Gait is intact.  Psychiatric:        Mood and Affect: Mood normal.        Behavior: Behavior normal.      Diabetic foot exam was performed with the following findings:   No deformities, ulcerations, or other skin breakdown Normal sensation of 10g monofilament Intact posterior tibialis and dorsalis pedis pulses      Lab Results  Component Value Date   WBC 7.1 03/31/2024   HGB 11.7 03/31/2024   HCT 37.3 03/31/2024   PLT 521 (H) 03/31/2024   GLUCOSE 104 (H) 03/31/2024   CHOL 139 12/26/2023   TRIG 111 12/26/2023   HDL 81 12/26/2023   LDLCALC 39 12/26/2023   ALT 52 (H) 03/31/2024   AST 118 (H) 03/31/2024   NA 139 03/31/2024   K 5.2 03/31/2024   CL 101 03/31/2024   CREATININE 0.97 03/31/2024   BUN 18 03/31/2024   CO2 22 03/31/2024   TSH 5.020 (H) 03/31/2024   HGBA1C 5.6 03/31/2024   Component Ref Range & Units (hover) 15:42 (03/31/24)  TC 230  HDL 90  TRG 138  LDL 113  Non-HDL 140     Assessment & Plan:  Diabetic polyneuropathy associated with type 2 diabetes mellitus (HCC) Assessment & Plan: Type 2 diabetes well-controlled, A1c 5.6. Reports numbness in feet indicating diabetic polyneuropathy. - Continue metformin  - Discontinue farxiga  to see if it is contributing to her polyuria. Recommend continue to work on eating healthy diet and exercise. Continue to check feet daily.    Orders: -     Flu vaccine HIGH DOSE PF(Fluzone  Trivalent) -     POCT glycosylated hemoglobin (Hb A1C) -     CBC with Differential/Platelet -     Comprehensive metabolic panel with GFR  Mixed hyperlipidemia Assessment & Plan: LDL not at goal. - Restart Crestor  5 mg once daily. - Continue Lovaza  1 g, two capsules twice daily. - Monitor liver enzymes.  Orders: -     POCT Lipid Panel -     Rosuvastatin  Calcium ; Take 1 tablet (5 mg total) by mouth daily.  Dispense: 90 tablet; Refill: 0 -      Omega-3-acid  Ethyl Esters; Take 2 capsules (2 g total) by mouth 2 (two) times daily.  Dispense: 360 capsule; Refill: 1  Vitamin D  insufficiency Assessment & Plan: The current medical regimen is effective;  continue present plan and medications. Continue vitamin d  50 K once weekly. Check vitamin d  level.   Orders: -     Vitamin D  (Ergocalciferol ); Take 1 capsule (50,000 Units total) by mouth once a week.  Dispense: 15 capsule; Refill: 1 -     VITAMIN D  25 Hydroxy (Vit-D Deficiency, Fractures)  Primary insomnia Assessment & Plan: Chronic insomnia with difficulty  initiating sleep. Previous medications ineffective, Ambien  caused cognitive side effects. - Prescribe Belsomra  10 mg once at bedtime. - Trial Belsomra  for 30 days before considering a 90-day prescription.  Orders: -     Belsomra ; Take 1 tablet (10 mg total) by mouth at bedtime as needed.  Dispense: 30 tablet; Refill: 2  Left wrist pain Assessment & Plan: Left wrist pain and swelling post-fall, possible fracture. - Order x-ray of left wrist at med center in [place].  Orders: -     DG Wrist Complete Left; Future  Tremor Assessment & Plan: Tremor in left arm and face, negative DAT scan reduces likelihood of Parkinson's. Carbidopa -levodopa  trial ineffective, nicotine patches show some benefit. - Continue carbidopa -levodopa  trial for 4-6 weeks. - Consult neurologist Dr. Pennamalee regarding nicotine patch use and medication adjustments.   Longstanding persistent atrial fibrillation [facility]) Assessment & Plan: Well controlled.  No changes to medicines.  Continue toprol  xl and xarelto .  Follow-up with cardiology as previously scheduled.   Acquired hypothyroidism Assessment & Plan: Hypothyroidism on levothyroxine  25 mcg daily. - Continue levothyroxine  25 mcg daily. - Check thyroid  levels.  Orders: -     T4, free -     TSH  Bipolar disorder with severe depression (HCC) Assessment & Plan: Chronic depression with low  mood, lack of motivation, anhedonia. Latuda  may be ineffective at current dose. Bipolar disorder diagnosis. - Increase Latuda  dose and ensure it is taken with food. - Consider psychiatric referral if mood does not improve.  Orders: -     Lurasidone  HCl; Take 1 tablet (80 mg total) by mouth daily with breakfast.  Dispense: 90 tablet; Refill: 0  Encounter for immunization  Urge incontinence Assessment & Plan: Urge urinary incontinence, Gemtesa ineffective. - Provide Myrbetriq  samples if available. - Contact [facility] for follow-up and discuss alternative treatments.  Orders: -     Mirabegron  ER; Take 1 tablet (25 mg total) by mouth daily.     Body mass index is 24.94 kg/m.   Meds ordered this encounter  Medications   Vitamin D , Ergocalciferol , (DRISDOL ) 1.25 MG (50000 UNIT) CAPS capsule    Sig: Take 1 capsule (50,000 Units total) by mouth once a week.    Dispense:  15 capsule    Refill:  1   rosuvastatin  (CRESTOR ) 5 MG tablet    Sig: Take 1 tablet (5 mg total) by mouth daily.    Dispense:  90 tablet    Refill:  0   Suvorexant  (BELSOMRA ) 10 MG TABS    Sig: Take 1 tablet (10 mg total) by mouth at bedtime as needed.    Dispense:  30 tablet    Refill:  2   lurasidone  (LATUDA ) 80 MG TABS tablet    Sig: Take 1 tablet (80 mg total) by mouth daily with breakfast.    Dispense:  90 tablet    Refill:  0   omega-3 acid ethyl esters (LOVAZA ) 1 g capsule    Sig: Take 2 capsules (2 g total) by mouth 2 (two) times daily.    Dispense:  360 capsule    Refill:  1   mirabegron  ER (MYRBETRIQ ) 25 MG TB24 tablet    Sig: Take 1 tablet (25 mg total) by mouth daily.    Orders Placed This Encounter  Procedures   DG Wrist Complete Left   Flu vaccine HIGH DOSE PF(Fluzone  Trivalent)   CBC with Differential/Platelet   Comprehensive metabolic panel with GFR   T4, free   TSH   VITAMIN  D 25 Hydroxy (Vit-D Deficiency, Fractures)   POCT glycosylated hemoglobin (Hb A1C)   POCT Lipid Panel      Total time spent on today's visit was 45 minutes, including both face-to-face time and nonface-to-face time personally spent on review of chart (labs and imaging), discussing labs and goals, discussing further work-up, treatment options, referrals to specialist if needed, reviewing outside records of pertinent, answering patient's questions, and coordinating care.   Follow-up: Return in about 6 weeks (around 05/12/2024) for chronic follow up for depression, 3 MONTH CHRONIC FOLLOW UP.  An After Visit Summary was printed and given to the patient.  Abigail Free, MD [facility] [phone]

## 2024-03-31 NOTE — Patient Instructions (Addendum)
  VISIT SUMMARY: Today, we addressed your ongoing issues with depression, tremors, insomnia, and other health concerns. We made some adjustments to your medications and recommended further evaluations to help manage your symptoms more effectively.  YOUR PLAN: TREMOR INVOLVING LEFT ARM AND FACE: You have a tremor in your left arm and face, and the likelihood of Parkinson's is low based on your DAT scan. -Continue taking carbidopa -levodopa  for another 4-6 weeks. -Consult with neurology about the use of nicotine patches -Keep follow up with Dr. Margaret.  LEFT WRIST PAIN AND SWELLING AFTER FALL: You have pain and swelling in your left wrist after a fall, and we need to check for a possible fracture. -Get an x-ray of your left wrist at the med center in [place].  Med [facility] [address] [place], [place] [address] [phone]  IMAGING 8-5 PM MONDAY THROUGH FRIDAY.    INSOMNIA: You have chronic insomnia and difficulty falling asleep. Previous medications have not been effective. -Start taking Belsomra  10 mg once at bedtime. -Try Belsomra  for 30 days before we consider a 90-day prescription.  DEPRESSION AND BIPOLAR DISORDER: You have chronic depression and bipolar disorder. Your current dose of Latuda  may not be effective. -Increase your dose of Latuda  80 mg before bed and make sure to take it with food. -Continue effexor  xr 75 mg once daily in am.  -Consider seeing a psychiatrist if your mood does not improve.  COGNITIVE IMPAIRMENT: You have some confusion and memory issues. -Continue taking Aricept  5 mg at bedtime.  DIZZINESS AND LIGHTHEADEDNESS: You experience intermittent dizziness and lightheadedness while standing. -Monitor your symptoms and let us  know if they worsen.  -Do not lean over.   RIGHT EAR PAIN: You have pain and a feeling of fullness in your right ear, but there are no signs of infection. -Monitor your symptoms and let us  know if they worsen.  TYPE 2  DIABETES MELLITUS WITH DIABETIC POLYNEUROPATHY: Your diabetes is well-controlled, but you have numbness in your feet indicating diabetic polyneuropathy. - Discontinue farxiga  to see if this is contributing to increased urination.  - continue metformin  1000 mg two daily.   MIXED HYPERLIPIDEMIA: You have mixed hyperlipidemia with elevated LDL levels. -Restart taking Crestor  5 mg once daily. -Continue taking Lovaza  1 g, two capsules twice daily. -We will monitor your liver enzymes.  HYPOTHYROIDISM: You have hypothyroidism and are taking levothyroxine . -Continue taking levothyroxine  25 mcg daily. -We will check your thyroid  levels.  URGE URINARY INCONTINENCE: You have urge urinary incontinence and previous treatments have not been effective. -We will provide you with Myrbetriq   25  mg once daily.  -Contact [facility] for a follow-up and discuss alternative treatments.                      Contains text generated by Abridge.                                 Contains text generated by Abridge.

## 2024-04-01 LAB — COMPREHENSIVE METABOLIC PANEL WITH GFR
ALT: 52 IU/L — ABNORMAL HIGH (ref 0–32)
AST: 118 IU/L — ABNORMAL HIGH (ref 0–40)
Albumin: 4 g/dL (ref 3.8–4.8)
Alkaline Phosphatase: 478 IU/L — ABNORMAL HIGH (ref 49–135)
BUN/Creatinine Ratio: 19 (ref 12–28)
BUN: 18 mg/dL (ref 8–27)
Bilirubin Total: 0.8 mg/dL (ref 0.0–1.2)
CO2: 22 mmol/L (ref 20–29)
Calcium: 9.5 mg/dL (ref 8.7–10.3)
Chloride: 101 mmol/L (ref 96–106)
Creatinine, Ser: 0.97 mg/dL (ref 0.57–1.00)
Globulin, Total: 2.1 g/dL (ref 1.5–4.5)
Glucose: 104 mg/dL — ABNORMAL HIGH (ref 70–99)
Potassium: 5.2 mmol/L (ref 3.5–5.2)
Sodium: 139 mmol/L (ref 134–144)
Total Protein: 6.1 g/dL (ref 6.0–8.5)
eGFR: 61 mL/min/1.73 (ref >59)

## 2024-04-01 LAB — CBC WITH DIFFERENTIAL/PLATELET
Basophils Absolute: 0.1 x10E3/uL (ref 0.0–0.2)
Basos: 1 %
EOS (ABSOLUTE): 0.2 x10E3/uL (ref 0.0–0.4)
Eos: 3 %
Hematocrit: 37.3 % (ref 34.0–46.6)
Hemoglobin: 11.7 g/dL (ref 11.1–15.9)
Immature Grans (Abs): 0 x10E3/uL (ref 0.0–0.1)
Immature Granulocytes: 0 %
Lymphocytes Absolute: 3.2 x10E3/uL — ABNORMAL HIGH (ref 0.7–3.1)
Lymphs: 44 %
MCH: 29.9 pg (ref 26.6–33.0)
MCHC: 31.4 g/dL — ABNORMAL LOW (ref 31.5–35.7)
MCV: 95 fL (ref 79–97)
Monocytes Absolute: 0.5 x10E3/uL (ref 0.1–0.9)
Monocytes: 8 %
Neutrophils Absolute: 3.1 x10E3/uL (ref 1.4–7.0)
Neutrophils: 44 %
Platelets: 521 x10E3/uL — ABNORMAL HIGH (ref 150–450)
RBC: 3.91 x10E6/uL (ref 3.77–5.28)
RDW: 13 % (ref 11.7–15.4)
WBC: 7.1 x10E3/uL (ref 3.4–10.8)

## 2024-04-01 LAB — TSH: TSH: 5.02 u[IU]/mL — ABNORMAL HIGH (ref 0.450–4.500)

## 2024-04-01 LAB — T4, FREE: Free T4: 1.28 ng/dL (ref 0.82–1.77)

## 2024-04-01 LAB — VITAMIN D 25 HYDROXY (VIT D DEFICIENCY, FRACTURES): Vit D, 25-Hydroxy: 47.6 ng/mL (ref 30.0–100.0)

## 2024-04-06 DIAGNOSIS — R251 Tremor, unspecified: Secondary | ICD-10-CM | POA: Insufficient documentation

## 2024-04-06 DIAGNOSIS — M25532 Pain in left wrist: Secondary | ICD-10-CM | POA: Insufficient documentation

## 2024-04-06 NOTE — Assessment & Plan Note (Signed)
 Hypothyroidism on levothyroxine  25 mcg daily. - Continue levothyroxine  25 mcg daily. - Check thyroid  levels.

## 2024-04-06 NOTE — Assessment & Plan Note (Addendum)
 LDL not at goal. - Restart Crestor  5 mg once daily. - Continue Lovaza  1 g, two capsules twice daily. - Monitor liver enzymes.

## 2024-04-06 NOTE — Assessment & Plan Note (Signed)
 Left wrist pain and swelling post-fall, possible fracture. - Order x-ray of left wrist at med center in [place].

## 2024-04-06 NOTE — Assessment & Plan Note (Signed)
 Well controlled.  No changes to medicines.  Continue toprol  xl and xarelto .  Follow-up with cardiology as previously scheduled.

## 2024-04-06 NOTE — Assessment & Plan Note (Signed)
 Chronic insomnia with difficulty initiating sleep. Previous medications ineffective, Ambien  caused cognitive side effects. - Prescribe Belsomra  10 mg once at bedtime. - Trial Belsomra  for 30 days before considering a 90-day prescription.

## 2024-04-06 NOTE — Assessment & Plan Note (Signed)
 Tremor in left arm and face, negative DAT scan reduces likelihood of Parkinson's. Carbidopa -levodopa  trial ineffective, nicotine patches show some benefit. - Continue carbidopa -levodopa  trial for 4-6 weeks. - Consult neurologist Dr. Pennamalee regarding nicotine patch use and medication adjustments.

## 2024-04-06 NOTE — Assessment & Plan Note (Deleted)
 Chronic depression with low mood, lack of motivation, anhedonia. Latuda  may be ineffective at current dose. Bipolar disorder diagnosis. - Increase Latuda  dose and ensure it is taken with food. - Consider psychiatric referral if mood does not improve.

## 2024-04-06 NOTE — Assessment & Plan Note (Addendum)
 Type 2 diabetes well-controlled, A1c 5.6. Reports numbness in feet indicating diabetic polyneuropathy. - Continue metformin  - Discontinue farxiga  to see if it is contributing to her polyuria. Recommend continue to work on eating healthy diet and exercise. Continue to check feet daily.

## 2024-04-06 NOTE — Assessment & Plan Note (Signed)
 The current medical regimen is effective;  continue present plan and medications. Continue vitamin d  50 K once weekly. Check vitamin d  level.

## 2024-04-06 NOTE — Assessment & Plan Note (Signed)
 Urge urinary incontinence, Gemtesa ineffective. - Provide Myrbetriq  samples if available. - Contact [facility] for follow-up and discuss alternative treatments.

## 2024-04-06 NOTE — Assessment & Plan Note (Signed)
 Chronic depression with low mood, lack of motivation, anhedonia. Latuda  may be ineffective at current dose. Bipolar disorder diagnosis. - Increase Latuda  dose and ensure it is taken with food. - Consider psychiatric referral if mood does not improve.

## 2024-05-12 ENCOUNTER — Encounter: Payer: Self-pay | Admitting: Diagnostic Neuroimaging

## 2024-05-12 ENCOUNTER — Ambulatory Visit: Admitting: Diagnostic Neuroimaging

## 2024-05-12 VITALS — BP 129/78 | HR 67 | Ht 67.0 in | Wt 157.6 lb

## 2024-05-12 DIAGNOSIS — R258 Other abnormal involuntary movements: Secondary | ICD-10-CM

## 2024-05-12 DIAGNOSIS — G252 Other specified forms of tremor: Secondary | ICD-10-CM

## 2024-05-12 DIAGNOSIS — R413 Other amnesia: Secondary | ICD-10-CM | POA: Diagnosis not present

## 2024-05-12 DIAGNOSIS — G20C Parkinsonism, unspecified: Secondary | ICD-10-CM

## 2024-05-12 MED ORDER — CARBIDOPA-LEVODOPA 25-100 MG PO TABS: 1.0000 | ORAL_TABLET | Freq: Three times a day (TID) | ORAL | 12 refills | Status: DC

## 2024-05-12 NOTE — Progress Notes (Signed)
 [facility]  PATIENT: April Munoz DOB: 17-Oct-1950  REFERRING CLINICIAN: CoxAbigail, MD  HISTORY FROM: patient REASON FOR VISIT: follow up   HISTORICAL  CHIEF COMPLAINT:  Chief Complaint  Patient presents with   RM 7     Patient is here with her friend April Munoz (ex-son in law's wife) for gait, memory and tremors MMSE -23    HISTORY OF PRESENT ILLNESS:   UPDATE (05/12/24, VRP): Since last visit, had DATscan  (which was reported as normal), but we tried carb/levo empirically, but not helping yet. Memory is declining. Has more tremor and gait diff. Sometimes using cane / walker.  UPDATE (02/11/24, VRP): Since last visit, continues with progressive gait and balance diff. Now with resting tremor and worsening balance and falls. Last major fall 2 weeks ago, injured left arm and left face.  UPDATE (03/14/22, VRP): Since last visit, doing about the same. Continues with insomnia, OSA, memory loss. No major changes in ADLs, except some issues with focusing and driving.   PRIOR HPI (1935): 73 year old female here for evaluation of memory loss, balance difficulty, insomnia, anxiety. Patient has hypertension, diabetes, hypercholesterolemia, atrial fibrillation, depression, anxiety, fibromyalgia.  Patient was diagnosed with atrial fibrillation 3 years ago.  She was recommended to start anticoagulation but she has not been able to due to financial limitations.  She does not want to take Coumadin.  She was prescribed Eliquis  but cannot afford it.  Over the past 1 year she has had at least 10-12 episodes of transient slurred speech and trouble talking.  Sometimes these are associated with staggering gait.  Episodes last for few minutes at a time.  She has not gone to the emergency room or had evaluation of these in the past.  Patient also has a gradual onset and progressive generalized balance difficulty, with numbness in her feet.  She feels like her toes are weak.  She has been  diagnosed with diabetic peripheral neuropathy.  She has numbness and tingling in her feet.  She has been on gabapentin  in the past.  Also with chronic anxiety and insomnia since childhood.  She has seen a therapist many years ago but not currently.  She is tried many sleep aids without benefit.   REVIEW OF SYSTEMS: Full 14 system review of systems performed and negative with exception of: as per HPI.   ALLERGIES: Allergies  Allergen Reactions   Atorvastatin      myalgias    HOME MEDICATIONS: Outpatient Medications Prior to Visit  Medication Sig Dispense Refill   B-D 3CC LUER-LOK SYR 25GX1 25G X 1 3 ML MISC USE WITH B12 INJECTIONS 3 each 10   donepezil  (ARICEPT ) 5 MG tablet TAKE 1 TABLET BY MOUTH EVERYDAY AT BEDTIME 90 tablet 0   EPINEPHRINE  0.3 mg/0.3 mL IJ SOAJ injection INJECT 0.3 MG INTRAMUSCULARLY AS NEEDED FOR anaphylaxis 2 each 1   levothyroxine  (SYNTHROID ) 25 MCG tablet Take 1 tablet (25 mcg total) by mouth daily. 90 tablet 1   lurasidone  (LATUDA ) 80 MG TABS tablet Take 1 tablet (80 mg total) by mouth daily with breakfast. 90 tablet 0   metFORMIN  (GLUCOPHAGE ) 1000 MG tablet Take 1 tablet (1,000 mg total) by mouth 2 (two) times daily with a meal. 180 tablet 1   metoprolol  succinate (TOPROL  XL) 50 MG 24 hr tablet Take 1 tablet (50 mg total) by mouth at bedtime. Take with or immediately following a meal. 90 tablet 3   mirabegron  ER (MYRBETRIQ ) 25 MG TB24 tablet Take 1 tablet (25  mg total) by mouth daily.     omega-3 acid ethyl esters (LOVAZA ) 1 g capsule Take 2 capsules (2 g total) by mouth 2 (two) times daily. 360 capsule 1   rivaroxaban  (XARELTO ) 20 MG TABS tablet TAKE 1 TABLET BY MOUTH DAILY WITH SUPPER. 90 tablet 1   rosuvastatin  (CRESTOR ) 5 MG tablet Take 1 tablet (5 mg total) by mouth daily. 90 tablet 0   Suvorexant  (BELSOMRA ) 10 MG TABS Take 1 tablet (10 mg total) by mouth at bedtime as needed. 30 tablet 2   Syringe/Needle, Disp, (SYRINGE 3CC/27GX1-1/4) 27G X 1-1/4 3 ML  MISC 1 each by Does not apply route every 30 (thirty) days. 100 each 0   torsemide  (DEMADEX ) 20 MG tablet Take 1 tablet (20 mg total) by mouth 3 (three) times a week. 90 tablet 1   valsartan  (DIOVAN ) 80 MG tablet TAKE 1 TABLET BY MOUTH EVERY DAY 90 tablet 0   venlafaxine  XR (EFFEXOR -XR) 75 MG 24 hr capsule Take 1 capsule (75 mg total) by mouth daily with breakfast. (Patient taking differently: Take 75 mg by mouth daily with breakfast. 2 in the morning) 90 capsule 0   Vitamin D , Ergocalciferol , (DRISDOL ) 1.25 MG (50000 UNIT) CAPS capsule Take 1 capsule (50,000 Units total) by mouth once a week. 15 capsule 1   carbidopa -levodopa  (SINEMET  IR) 25-100 MG tablet Take 1 tablet by mouth 3 (three) times daily before meals. 90 tablet 6   No facility-administered medications prior to visit.    PAST MEDICAL HISTORY: Past Medical History:  Diagnosis Date   Anxiety    Arthritis    Ataxic gait    Atrial fibrillation (HCC)    Atrophy of thyroid     B12 deficiency 10/31/2017   Cellulitis of abdominal wall 07/07/2016   Chronic anticoagulation 05/31/2018   Depression    Depression, major, recurrent, mild 09/16/2019   Diabetes mellitus without complication (HCC)    Diabetic glomerulopathy (HCC) 09/16/2019   Esophageal stricture 06/05/2017   Essential hypertension 02/24/2013   Fatigue 01/20/2020   Fibromyalgia 02/24/2013   Full dentures    Hereditary and idiopathic peripheral neuropathy 02/24/2013   High risk medication use 11/07/2017   Hyperlipidemia 02/24/2013   Hypertension    Hypertensive heart disease 02/24/2013   Incontinence    Insomnia    Longstanding persistent atrial fibrillation (HCC) 09/16/2019   Mild vitamin D  deficiency 09/16/2019   Mixed hyperlipidemia 02/24/2013   Murmur, cardiac 08/15/2015   Neuropathy in diabetes (HCC) 10/31/2017   Osteoporosis    Other amnesia    Other amnesia    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    Paroxysmal atrial fibrillation (HCC) 08/15/2015    CHADS2vasc=3 CHADS2vasc=3   Primary insomnia    Restless legs syndrome 03/14/2013   Secondary hypothyroidism 09/16/2019   Thoracic or lumbosacral neuritis or radiculitis 02/24/2013   Type 2 diabetes mellitus, without long-term current use of insulin (HCC) 02/24/2013   Urge incontinence of urine 09/16/2019   Wears glasses     PAST SURGICAL HISTORY: Past Surgical History:  Procedure Laterality Date   A-FLUTTER ABLATION N/A 07/30/2019   Procedure: A-FLUTTER ABLATION;  Surgeon: Inocencio Soyla Lunger, MD;  Location: [facility] INVASIVE CV LAB;  Service: Cardiovascular;  Laterality: N/A;   ATRIAL FIBRILLATION ABLATION N/A 03/21/2019   Procedure: ATRIAL FIBRILLATION ABLATION;  Surgeon: Inocencio Soyla Lunger, MD;  Location: [facility]AS[facility];  Service: Cardiovascular;  Laterality: N/A;   ATRIAL FIBRILLATION ABLATION N/A 10/27/2021   Procedure: ATRIAL FIBRILLATION ABLATION;  Surgeon: Inocencio Soyla Lunger, MD;  Location: [facility] INVASIVE CV LAB;  Service: Cardiovascular;  Laterality: N/A;   BREAST REDUCTION SURGERY  1983   BUBBLE STUDY  11/26/2020   Procedure: BUBBLE STUDY;  Surgeon: Mona Vinie BROCKS, MD;  Location: [facility] ENDOSCOPY;  Service: Cardiovascular;;   CARDIOVERSION N/A 11/26/2020   Procedure: CARDIOVERSION;  Surgeon: Mona Vinie BROCKS, MD;  Location: [facility] ENDOSCOPY;  Service: Cardiovascular;  Laterality: N/A;   CATARACT EXTRACTION, BILATERAL  05-24-2017, 05-24-2018   CHOLECYSTECTOMY  1971   open   DIAGNOSTIC LAPAROSCOPY  2010   lysis of adhesions   DILATION AND CURETTAGE OF UTERUS  2003-05-25   KNEE ARTHROSCOPY  1998   left   KNEE ARTHROSCOPY W/ LATERAL RETINACULAR REPAIR     MASS EXCISION Left 11/11/2013   Procedure: EXCISION MUCOID CYST LEFT INDEX FINGER/DEBRIDEMENT LEFT INDEX FINGER;  Surgeon: Arley JONELLE Curia, MD;  Location: [facility];  Service: Orthopedics;  Laterality: Left;  ANESTHESIA: IV REGIONAL/FAB   SHOULDER ARTHROSCOPY W/ ROTATOR CUFF REPAIR  2006-05-24   left   TEE WITHOUT CARDIOVERSION N/A 11/26/2020    Procedure: TRANSESOPHAGEAL ECHOCARDIOGRAM (TEE);  Surgeon: Mona Vinie BROCKS, MD;  Location: [facility] ENDOSCOPY;  Service: Cardiovascular;  Laterality: N/A;   TRIGGER FINGER RELEASE Left 11/11/2013   Procedure: RELEASE A-1 PULLEY LEFT RING FINGER;  Surgeon: Arley JONELLE Curia, MD;  Location: [facility];  Service: Orthopedics;  Laterality: Left;   UMBILICAL HERNIA REPAIR  2008, 2010    FAMILY HISTORY: Family History  Problem Relation Age of Onset   Atrial fibrillation Mother    Heart attack Mother    Heart failure Father    Heart disease Brother    Heart attack Brother    Bladder Cancer Brother    Diabetes Maternal Grandmother    Heart failure Paternal Grandfather    Stroke Paternal Grandfather     SOCIAL HISTORY: Social History   Socioeconomic History   Marital status: Widowed    Spouse name: Dick   Number of children: 2   Years of education: 14   Highest education level: Some college, no degree  Occupational History   Occupation: retired  Tobacco Use   Smoking status: Never   Smokeless tobacco: Never  Vaping Use   Vaping status: Never Used  Substance and Sexual Activity   Alcohol use: Yes    Alcohol/week: 1.0 - 2.0 standard drink of alcohol    Types: 1 - 2 Glasses of wine per week    Comment: 4 times a month   Drug use: No   Sexual activity: Not Currently  Other Topics Concern   Not on file  Social History Narrative   Husband passed away 05-24-2022 - one daughter that lives in [place] (adopted) and one son (adopted)   Left handed   Caffeine: caffeine free drinks. 2 cup of coffee a day   Lives with friends    Social Drivers of Health   Financial Resource Strain: High Risk (02/28/2023)   Overall Financial Resource Strain (CARDIA)    Difficulty of Paying Living Expenses: Very hard  Food Insecurity: No Food Insecurity (12/26/2023)   Hunger Vital Sign    Worried About Running Out of Food in the Last Year: Never true    Ran Out of Food in the Last Year: Never true   Transportation Needs: No Transportation Needs (12/26/2023)   PRAPARE - Administrator, Civil Service (Medical): No    Lack of Transportation (Non-Medical): No  Physical  Activity: Inactive (02/28/2023)   Exercise Vital Sign    Days of Exercise per Week: 0 days    Minutes of Exercise per Session: 0 min  Stress: No Stress Concern Present (02/28/2023)   [employer] of Occupational Health - Occupational Stress Questionnaire    Feeling of Stress : Only a little  Social Connections: Socially Isolated (03/08/2023)   Social Connection and Isolation Panel    Frequency of Communication with Friends and Family: More than three times a week    Frequency of Social Gatherings with Friends and Family: More than three times a week    Attends Religious Services: Never    Database Administrator or Organizations: No    Attends Banker Meetings: Not on file    Marital Status: Widowed  Intimate Partner Violence: Not At Risk (12/26/2023)   Humiliation, Afraid, Rape, and Kick questionnaire    Fear of Current or Ex-Partner: No    Emotionally Abused: No    Physically Abused: No    Sexually Abused: No     PHYSICAL EXAM  GENERAL EXAM/CONSTITUTIONAL: Vitals:  Vitals:   05/12/24 1453  BP: 129/78  Pulse: 67  Weight: 157 lb 9.6 oz (71.5 kg)  Height: 5' 7 (1.702 m)   Body mass index is 24.68 kg/m. Wt Readings from Last 3 Encounters:  05/12/24 157 lb 9.6 oz (71.5 kg)  03/31/24 164 lb (74.4 kg)  02/11/24 168 lb (76.2 kg)   Patient is in no distress; well developed, nourished and groomed; neck is supple  CARDIOVASCULAR: Examination of carotid arteries is normal; no carotid bruits Regular rate and rhythm, no murmurs Examination of peripheral vascular system by observation and palpation is normal  EYES: Ophthalmoscopic exam of optic discs and posterior segments is normal; no papilledema or hemorrhages No results found.   MUSCULOSKELETAL: Gait, strength, tone,  movements noted in Neurologic exam below  NEUROLOGIC: MENTAL STATUS:     05/12/2024    3:00 PM 02/20/2022    4:12 PM 02/08/2022    8:55 AM  MMSE - Mini Mental State Exam  Orientation to time 4 3 3   Orientation to Place 4 5 4   Registration 3 3 3   Attention/ Calculation 1 3 5   Recall 3 2 3   Language- name 2 objects 2 2 2   Language- repeat 1 1 1   Language- follow 3 step command 3 3 3   Language- read & follow direction 1 1 1   Write a sentence 1 1 1   Copy design 0 1 1  Total score 23 25 27    awake, alert, oriented to person, place and time recent and remote memory intact normal attention and concentration language fluent, comprehension intact, naming intact fund of knowledge appropriate  CRANIAL NERVE:  2nd - no papilledema on fundoscopic exam 2nd, 3rd, 4th, 6th - pupils equal and reactive to light, visual fields full to confrontation, extraocular muscles intact, no nystagmus 5th - facial sensation symmetric 7th - facial strength symmetric 8th - hearing intact 9th - palate elevates symmetrically, uvula midline 11th - shoulder shrug symmetric 12th - tongue protrusion midline  MOTOR:  INCREASED TONE IN BUE (COGWHEELING RIGIDITY) RESTING TREMOR IN BUE, FEET AND MOUTH BRADYKINESIA IN LUE > RUE; ALSO IN BLE DIFFUSE 4/5 STRENGTH  SENSORY:  normal and symmetric to light touch; ABSENT VIB AT ANKLES / FEET  COORDINATION:  finger-nose-finger, fine finger movements SLOW  REFLEXES:  deep tendon reflexes TRACE and symmetric; ABSENT AT ANKLES  GAIT/STATION:  narrow based gait; SHORT STEPS;  VEERING TO LEFT     DIAGNOSTIC DATA (LABS, IMAGING, TESTING) - I reviewed patient records, labs, notes, testing and imaging myself where available.  Lab Results  Component Value Date   WBC 7.1 03/31/2024   HGB 11.7 03/31/2024   HCT 37.3 03/31/2024   MCV 95 03/31/2024   PLT 521 (H) 03/31/2024      Component Value Date/Time   NA 139 03/31/2024 1626   K 5.2 03/31/2024 1626   CL 101  03/31/2024 1626   CO2 22 03/31/2024 1626   GLUCOSE 104 (H) 03/31/2024 1626   GLUCOSE 130 (H) 11/26/2020 0926   BUN 18 03/31/2024 1626   CREATININE 0.97 03/31/2024 1626   CALCIUM  9.5 03/31/2024 1626   PROT 6.1 03/31/2024 1626   ALBUMIN 4.0 03/31/2024 1626   AST 118 (H) 03/31/2024 1626   ALT 52 (H) 03/31/2024 1626   ALKPHOS 478 (H) 03/31/2024 1626   BILITOT 0.8 03/31/2024 1626   GFRNONAA 53 (L) 09/03/2020 0953   GFRAA 61 09/03/2020 0953   Lab Results  Component Value Date   CHOL 139 12/26/2023   HDL 81 12/26/2023   LDLCALC 39 12/26/2023   TRIG 111 12/26/2023   CHOLHDL 1.7 12/26/2023   Lab Results  Component Value Date   HGBA1C 5.6 03/31/2024   Lab Results  Component Value Date   VITAMINB12 427 07/12/2023   Lab Results  Component Value Date   TSH 5.020 (H) 03/31/2024     08/02/18 MRI brain [I reviewed images myself and agree with interpretation. -VRP]  - moderate atrophy - moderate chronic small vessel ischemic disease   02/21/22 MRI brain 1. No acute intracranial process. 2. Advanced cerebral volume loss for age with sequela of severe chronic small vessel ischemic disease. No disproportionate lobar atrophy. 3. Narrowing of the right cavernous ICA flow void concerning for moderate to severe stenosis. Consider a CTA head and neck for further evaluation.  03/03/22 CTA head / neck 1. Mild for age carotid atherosclerosis. No stenosis at the right cavernous ICA which was highlighted on prior brain MRI. 2. There is high-grade narrowing at the proximal right PCA. 3. Advanced chronic small vessel ischemia. 4. 7 mm colloid cyst.  03/05/24 CT head  1. No evidence of acute intracranial abnormality. 2. Chronic 7 mm colloid cyst at the foramen of [place] is unchanged . No evidence of obstructive hydrocephalus. 3. Moderate to advanced chronic microvascular ischemic disease.  03/05/24 DATscan  [I reviewed images myself. Possible decreased uptake in the right basal ganglia, with  round shape compared to the left side which is larger and comma shaped. Will ask radiology to review again. -VRP]  - Ioflupane scan within normal limits. No reduced radiotracer activity in basal ganglia to suggest Parkinson's syndrome pathology.   ASSESSMENT AND PLAN  73 y.o. year old female here with:  Dx:  1. Resting tremor   2. Bradykinesia   3. Memory loss   4. Parkinsonism, unspecified Parkinsonism type (HCC)     PLAN:  TREMOR / GAIT / BALANCE DIFFICULTY / MEMORY LOSS (symptoms suspicious for dementia with lewy bodies vs parkinsonism) - increase carb/levo to 2 tabs three times a day  - use rollator walker; fall precautions reviewed - refer to home health agency (PT, nursing, aid)  Meds ordered this encounter  Medications   carbidopa -levodopa  (SINEMET  IR) 25-100 MG tablet    Sig: Take 1-2 tablets by mouth 3 (three) times daily before meals.    Dispense:  180 tablet    Refill:  12   Return in about 8 months (around 01/10/2025) for MyChart visit (15 min).    EDUARD FABIENE HANLON, MD 05/12/2024, 3:48 PM Certified in Neurology, Neurophysiology and Neuroimaging  [facility] [address] [place], [place] [address] [phone]

## 2024-05-13 ENCOUNTER — Telehealth: Payer: Self-pay | Admitting: Diagnostic Neuroimaging

## 2024-05-13 ENCOUNTER — Ambulatory Visit: Admitting: Family Medicine

## 2024-05-13 NOTE — Telephone Encounter (Signed)
 Carlie from [facility] called stating that the pt had Nursing, Home Health Therapy ordered for her in the hospital but the pt is refusing and only wanting PT and OT.  [phone]

## 2024-05-13 NOTE — Telephone Encounter (Signed)
 [facility] is going to take this patient.

## 2024-05-14 ENCOUNTER — Telehealth: Payer: Self-pay

## 2024-05-14 DIAGNOSIS — E1042 Type 1 diabetes mellitus with diabetic polyneuropathy: Secondary | ICD-10-CM | POA: Diagnosis not present

## 2024-05-14 DIAGNOSIS — Z7984 Long term (current) use of oral hypoglycemic drugs: Secondary | ICD-10-CM | POA: Diagnosis not present

## 2024-05-14 DIAGNOSIS — G609 Hereditary and idiopathic neuropathy, unspecified: Secondary | ICD-10-CM | POA: Diagnosis not present

## 2024-05-14 DIAGNOSIS — E1121 Type 2 diabetes mellitus with diabetic nephropathy: Secondary | ICD-10-CM | POA: Diagnosis not present

## 2024-05-14 DIAGNOSIS — F419 Anxiety disorder, unspecified: Secondary | ICD-10-CM | POA: Diagnosis not present

## 2024-05-14 DIAGNOSIS — E782 Mixed hyperlipidemia: Secondary | ICD-10-CM | POA: Diagnosis not present

## 2024-05-14 DIAGNOSIS — E559 Vitamin D deficiency, unspecified: Secondary | ICD-10-CM | POA: Diagnosis not present

## 2024-05-14 DIAGNOSIS — G2581 Restless legs syndrome: Secondary | ICD-10-CM | POA: Diagnosis not present

## 2024-05-14 DIAGNOSIS — E034 Atrophy of thyroid (acquired): Secondary | ICD-10-CM | POA: Diagnosis not present

## 2024-05-14 DIAGNOSIS — F330 Major depressive disorder, recurrent, mild: Secondary | ICD-10-CM | POA: Diagnosis not present

## 2024-05-14 DIAGNOSIS — F5101 Primary insomnia: Secondary | ICD-10-CM | POA: Diagnosis not present

## 2024-05-14 DIAGNOSIS — Z7901 Long term (current) use of anticoagulants: Secondary | ICD-10-CM | POA: Diagnosis not present

## 2024-05-14 DIAGNOSIS — I4811 Longstanding persistent atrial fibrillation: Secondary | ICD-10-CM | POA: Diagnosis not present

## 2024-05-14 DIAGNOSIS — M810 Age-related osteoporosis without current pathological fracture: Secondary | ICD-10-CM | POA: Diagnosis not present

## 2024-05-14 DIAGNOSIS — G4733 Obstructive sleep apnea (adult) (pediatric): Secondary | ICD-10-CM | POA: Diagnosis not present

## 2024-05-14 DIAGNOSIS — Z8673 Personal history of transient ischemic attack (TIA), and cerebral infarction without residual deficits: Secondary | ICD-10-CM | POA: Diagnosis not present

## 2024-05-14 DIAGNOSIS — I119 Hypertensive heart disease without heart failure: Secondary | ICD-10-CM | POA: Diagnosis not present

## 2024-05-14 DIAGNOSIS — E038 Other specified hypothyroidism: Secondary | ICD-10-CM | POA: Diagnosis not present

## 2024-05-14 DIAGNOSIS — N3941 Urge incontinence: Secondary | ICD-10-CM | POA: Diagnosis not present

## 2024-05-14 DIAGNOSIS — M797 Fibromyalgia: Secondary | ICD-10-CM | POA: Diagnosis not present

## 2024-05-14 DIAGNOSIS — G20C Parkinsonism, unspecified: Secondary | ICD-10-CM | POA: Diagnosis not present

## 2024-05-14 DIAGNOSIS — Z9181 History of falling: Secondary | ICD-10-CM | POA: Diagnosis not present

## 2024-05-14 NOTE — Telephone Encounter (Signed)
 I called pt, April Munoz (is on DPR) caregiver. Hedda was just coming in call dropped. I called Carlie with [facility] and we did get message.  Pt did not want nursing or aide.

## 2024-05-14 NOTE — Telephone Encounter (Signed)
 Copied from CRM [id number]. Topic: Appointments - Appointment Scheduling >> May 12, 2024 10:11 AM Sophia H wrote: Patient/patient representative is calling to schedule an appointment. Refer to attachments for appointment information.   Friend - Reschedule appt Return in about 6 weeks (around 05/12/2024) for chronic follow up for depression. Unable to take appt 10/28 (that was the original appt that was cancelled)  Nothing available with PCP till December, [name] is wanting to know if the patient can be worked in with another provider. Please reach out to advise. # [phone]

## 2024-05-16 ENCOUNTER — Telehealth: Payer: Self-pay | Admitting: Family Medicine

## 2024-05-16 ENCOUNTER — Other Ambulatory Visit: Payer: Self-pay | Admitting: Family Medicine

## 2024-05-16 DIAGNOSIS — F314 Bipolar disorder, current episode depressed, severe, without psychotic features: Secondary | ICD-10-CM

## 2024-05-16 NOTE — Telephone Encounter (Signed)
 Copied from CRM #[id number]. Topic: Clinical - Prescription Issue >> May 16, 2024  9:35 AM Charlet HERO wrote: Reason for CRM: Friend of patient is calling bc the script for venlafaxine  XR (EFFEXOR -XR) 75 MG 24 hr capsule needs to be resent to [facility] to state that she is suppose to take 2x's a day to [facility] [place].

## 2024-05-19 ENCOUNTER — Ambulatory Visit: Admitting: Family Medicine

## 2024-05-21 NOTE — Telephone Encounter (Signed)
 Resheeb From [facility] Requesting verbal for Pt OT  For 1x-8wk  Callback [phone]

## 2024-05-21 NOTE — Telephone Encounter (Signed)
 Resheeb From [facility] Requesting verbal for Pt OT  For 1x-8wk   Callback [phone]  Returned call and gave verbal orders as requested. This was for OT, not PT and it was for arms/hands.

## 2024-05-28 ENCOUNTER — Telehealth: Payer: Self-pay | Admitting: Family Medicine

## 2024-05-28 DIAGNOSIS — I119 Hypertensive heart disease without heart failure: Secondary | ICD-10-CM | POA: Diagnosis not present

## 2024-05-28 DIAGNOSIS — F330 Major depressive disorder, recurrent, mild: Secondary | ICD-10-CM | POA: Diagnosis not present

## 2024-05-28 DIAGNOSIS — G20C Parkinsonism, unspecified: Secondary | ICD-10-CM | POA: Diagnosis not present

## 2024-05-28 DIAGNOSIS — I4811 Longstanding persistent atrial fibrillation: Secondary | ICD-10-CM | POA: Diagnosis not present

## 2024-05-28 DIAGNOSIS — M810 Age-related osteoporosis without current pathological fracture: Secondary | ICD-10-CM | POA: Diagnosis not present

## 2024-05-28 DIAGNOSIS — E1042 Type 1 diabetes mellitus with diabetic polyneuropathy: Secondary | ICD-10-CM | POA: Diagnosis not present

## 2024-05-28 DIAGNOSIS — G4733 Obstructive sleep apnea (adult) (pediatric): Secondary | ICD-10-CM | POA: Diagnosis not present

## 2024-05-28 DIAGNOSIS — M797 Fibromyalgia: Secondary | ICD-10-CM | POA: Diagnosis not present

## 2024-05-28 DIAGNOSIS — F5101 Primary insomnia: Secondary | ICD-10-CM | POA: Diagnosis not present

## 2024-05-28 DIAGNOSIS — G2581 Restless legs syndrome: Secondary | ICD-10-CM | POA: Diagnosis not present

## 2024-05-28 DIAGNOSIS — F419 Anxiety disorder, unspecified: Secondary | ICD-10-CM | POA: Diagnosis not present

## 2024-05-28 NOTE — Telephone Encounter (Signed)
 [facility] [phone]

## 2024-05-29 ENCOUNTER — Ambulatory Visit (INDEPENDENT_AMBULATORY_CARE_PROVIDER_SITE_OTHER)

## 2024-05-29 ENCOUNTER — Ambulatory Visit: Admitting: Family Medicine

## 2024-05-29 ENCOUNTER — Encounter: Payer: Self-pay | Admitting: Family Medicine

## 2024-05-29 VITALS — BP 126/68 | HR 80 | Temp 98.0°F | Ht 67.0 in | Wt 155.6 lb

## 2024-05-29 DIAGNOSIS — Z0000 Encounter for general adult medical examination without abnormal findings: Secondary | ICD-10-CM

## 2024-05-29 DIAGNOSIS — F314 Bipolar disorder, current episode depressed, severe, without psychotic features: Secondary | ICD-10-CM

## 2024-05-29 MED ORDER — VENLAFAXINE HCL ER 75 MG PO CP24: 75.0000 mg | ORAL_CAPSULE | Freq: Two times a day (BID) | ORAL | 0 refills | Status: DC

## 2024-05-29 NOTE — Progress Notes (Signed)
 Subjective:  Patient ID: April Munoz, female    DOB: 29-Jul-1950  Age: 73 y.o. MRN: [id number]  Chief Complaint  Patient presents with   Medical Management of Chronic Issues   Depression   Discussed the use of AI scribe software for clinical note transcription with the patient, who gave verbal consent to proceed.  History of Present Illness   April Munoz is a 73 year old female who presents for a follow-up visit regarding her medication management for depression and anxiety.  Depressive and anxiety symptoms - Depression and anxiety managed with venlafaxine  (Effexor ) - Initial dose of 75 mg once daily, later increased to 75 mg twice daily - Ran out of medication approximately two weeks ago and has been without it for about one week  Medication management - Currently seeking a refill of venlafaxine  (Effexor ) - Requests a 90-day supply          05/29/2024   11:24 AM 03/31/2024    4:17 PM 12/26/2023    3:01 PM 09/26/2023    9:51 AM 07/12/2023    8:55 AM  Depression screen PHQ 2/9  Decreased Interest 1 3 3 3 1   Down, Depressed, Hopeless 2 3 3 3 2   PHQ - 2 Score 3 6 6 6 3   Altered sleeping 3 3 3 3 3   Tired, decreased energy 3 3 3 3 3   Change in appetite 3 3 0 2 0  Feeling bad or failure about yourself  0 0 0 3 0  Trouble concentrating 3 0 2 0 0  Moving slowly or fidgety/restless 2 1 0 3 1  Suicidal thoughts 0 0 0 0 0  PHQ-9 Score 17 16  14  20  10    Difficult doing work/chores Somewhat difficult Somewhat difficult Not difficult at all Very difficult Not difficult at all     Data saved with a previous flowsheet row definition        05/29/2024   11:17 AM  Fall Risk   Falls in the past year? 1  Number falls in past yr: 0  Injury with Fall? 1  Risk for fall due to : History of fall(s)  Follow up Falls evaluation completed    Patient Care Team: Sherre Clapper, MD as PCP - General (Family Medicine) Monetta Redell PARAS, MD as PCP - Cardiology (Cardiology) Inocencio Soyla Lunger,  MD as PCP - Electrophysiology (Cardiology) Monetta Redell PARAS, MD as Consulting Physician (Cardiology) Misenheimer, Evalene, MD as Consulting Physician (Unknown Physician Specialty) Joesph Lynwood DEL, MD as Consulting Physician (General Surgery) Beverli Cara PARAS, [facility] (Inactive) (Pharmacist)   Review of Systems  Constitutional:  Negative for chills, diaphoresis, fatigue and fever.  HENT:  Negative for congestion, ear pain and sinus pain.   Eyes: Negative.   Respiratory:  Negative for cough and shortness of breath.   Cardiovascular:  Negative for chest pain.  Gastrointestinal:  Negative for abdominal pain, constipation, nausea and vomiting.  Endocrine: Negative.   Genitourinary:  Negative for dysuria.  Musculoskeletal:  Negative for arthralgias.  Allergic/Immunologic: Negative.   Neurological:  Positive for headaches. Negative for dizziness, weakness and light-headedness.  Hematological: Negative.   Psychiatric/Behavioral:  Negative for dysphoric mood. The patient is not nervous/anxious.     Current Outpatient Medications on File Prior to Visit  Medication Sig Dispense Refill   B-D 3CC LUER-LOK SYR 25GX1 25G X 1 3 ML MISC USE WITH B12 INJECTIONS 3 each 10   carbidopa -levodopa  (SINEMET  IR) 25-100 MG tablet Take 1-2 tablets by  mouth 3 (three) times daily before meals. 180 tablet 12   donepezil  (ARICEPT ) 5 MG tablet TAKE 1 TABLET BY MOUTH EVERYDAY AT BEDTIME 90 tablet 0   EPINEPHRINE  0.3 mg/0.3 mL IJ SOAJ injection INJECT 0.3 MG INTRAMUSCULARLY AS NEEDED FOR anaphylaxis 2 each 1   levothyroxine  (SYNTHROID ) 25 MCG tablet Take 1 tablet (25 mcg total) by mouth daily. 90 tablet 1   lurasidone  (LATUDA ) 80 MG TABS tablet Take 1 tablet (80 mg total) by mouth daily with breakfast. 90 tablet 0   metFORMIN  (GLUCOPHAGE ) 1000 MG tablet Take 1 tablet (1,000 mg total) by mouth 2 (two) times daily with a meal. 180 tablet 1   metoprolol  succinate (TOPROL  XL) 50 MG 24 hr tablet Take 1 tablet (50 mg total) by  mouth at bedtime. Take with or immediately following a meal. 90 tablet 3   mirabegron  ER (MYRBETRIQ ) 25 MG TB24 tablet Take 1 tablet (25 mg total) by mouth daily.     omega-3 acid ethyl esters (LOVAZA ) 1 g capsule Take 2 capsules (2 g total) by mouth 2 (two) times daily. 360 capsule 1   rivaroxaban  (XARELTO ) 20 MG TABS tablet TAKE 1 TABLET BY MOUTH DAILY WITH SUPPER. 90 tablet 1   rosuvastatin  (CRESTOR ) 5 MG tablet Take 1 tablet (5 mg total) by mouth daily. 90 tablet 0   Suvorexant  (BELSOMRA ) 10 MG TABS Take 1 tablet (10 mg total) by mouth at bedtime as needed. 30 tablet 2   Syringe/Needle, Disp, (SYRINGE 3CC/27GX1-1/4) 27G X 1-1/4 3 ML MISC 1 each by Does not apply route every 30 (thirty) days. 100 each 0   torsemide  (DEMADEX ) 20 MG tablet Take 1 tablet (20 mg total) by mouth 3 (three) times a week. 90 tablet 1   valsartan  (DIOVAN ) 80 MG tablet TAKE 1 TABLET BY MOUTH EVERY DAY 90 tablet 0   Vitamin D , Ergocalciferol , (DRISDOL ) 1.25 MG (50000 UNIT) CAPS capsule Take 1 capsule (50,000 Units total) by mouth once a week. 15 capsule 1   No current facility-administered medications on file prior to visit.   Past Medical History:  Diagnosis Date   Anxiety    Arthritis    Ataxic gait    Atrial fibrillation (HCC)    Atrophy of thyroid     B12 deficiency 10/31/2017   Cellulitis of abdominal wall 07/07/2016   Chronic anticoagulation 05/31/2018   Depression    Depression, major, recurrent, mild 09/16/2019   Diabetes mellitus without complication (HCC)    Diabetic glomerulopathy (HCC) 09/16/2019   Esophageal stricture 06/05/2017   Essential hypertension 02/24/2013   Fatigue 01/20/2020   Fibromyalgia 02/24/2013   Full dentures    Hereditary and idiopathic peripheral neuropathy 02/24/2013   High risk medication use 11/07/2017   Hyperlipidemia 02/24/2013   Hypertension    Hypertensive heart disease 02/24/2013   Incontinence    Insomnia    Longstanding persistent atrial fibrillation (HCC) 09/16/2019   Mild  vitamin D  deficiency 09/16/2019   Mixed hyperlipidemia 02/24/2013   Murmur, cardiac 08/15/2015   Neuropathy in diabetes (HCC) 10/31/2017   Osteoporosis    Other amnesia    Other amnesia    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    Paroxysmal atrial fibrillation (HCC) 08/15/2015   CHADS2vasc=3 CHADS2vasc=3   Primary insomnia    Restless legs syndrome 03/14/2013   Secondary hypothyroidism 09/16/2019   Thoracic or lumbosacral neuritis or radiculitis 02/24/2013   Type 2 diabetes mellitus, without long-term current use of insulin (HCC) 02/24/2013   Urge  incontinence of urine 09/16/2019   Wears glasses    Past Surgical History:  Procedure Laterality Date   A-FLUTTER ABLATION N/A 07/30/2019   Procedure: A-FLUTTER ABLATION;  Surgeon: Inocencio Soyla Lunger, MD;  Location: [facility];  Service: Cardiovascular;  Laterality: N/A;   ATRIAL FIBRILLATION ABLATION N/A 03/21/2019   Procedure: ATRIAL FIBRILLATION ABLATION;  Surgeon: Inocencio Soyla Lunger, MD;  Location: [facility];  Service: Cardiovascular;  Laterality: N/A;   ATRIAL FIBRILLATION ABLATION N/A 10/27/2021   Procedure: ATRIAL FIBRILLATION ABLATION;  Surgeon: Inocencio Soyla Lunger, MD;  Location: [facility];  Service: Cardiovascular;  Laterality: N/A;   BREAST REDUCTION SURGERY  1983   BUBBLE STUDY  11/26/2020   Procedure: BUBBLE STUDY;  Surgeon: Mona Vinie BROCKS, MD;  Location: [facility] ENDOSCOPY;  Service: Cardiovascular;;   CARDIOVERSION N/A 11/26/2020   Procedure: CARDIOVERSION;  Surgeon: Mona Vinie BROCKS, MD;  Location: [facility] ENDOSCOPY;  Service: Cardiovascular;  Laterality: N/A;   CATARACT EXTRACTION, BILATERAL  Jun 27, 2017, 2018/06/27   CHOLECYSTECTOMY  1971   open   DIAGNOSTIC LAPAROSCOPY  2010   lysis of adhesions   DILATION AND CURETTAGE OF UTERUS  June 28, 2003   KNEE ARTHROSCOPY  1998   left   KNEE ARTHROSCOPY W/ LATERAL RETINACULAR REPAIR     MASS EXCISION Left 11/11/2013   Procedure: EXCISION MUCOID CYST LEFT INDEX  FINGER/DEBRIDEMENT LEFT INDEX FINGER;  Surgeon: Arley JONELLE Curia, MD;  Location: [facility];  Service: Orthopedics;  Laterality: Left;  ANESTHESIA: IV REGIONAL/FAB   SHOULDER ARTHROSCOPY W/ ROTATOR CUFF REPAIR  06/27/2006   left   TEE WITHOUT CARDIOVERSION N/A 11/26/2020   Procedure: TRANSESOPHAGEAL ECHOCARDIOGRAM (TEE);  Surgeon: Mona Vinie BROCKS, MD;  Location: [facility] ENDOSCOPY;  Service: Cardiovascular;  Laterality: N/A;   TRIGGER FINGER RELEASE Left 11/11/2013   Procedure: RELEASE A-1 PULLEY LEFT RING FINGER;  Surgeon: Arley JONELLE Curia, MD;  Location: [facility];  Service: Orthopedics;  Laterality: Left;   UMBILICAL HERNIA REPAIR  2008, 2010    Family History  Problem Relation Age of Onset   Atrial fibrillation Mother    Heart attack Mother    Heart failure Father    Heart disease Brother    Heart attack Brother    Bladder Cancer Brother    Diabetes Maternal Grandmother    Heart failure Paternal Grandfather    Stroke Paternal Grandfather    Social History   Socioeconomic History   Marital status: Widowed    Spouse name: Dick   Number of children: 2   Years of education: 14   Highest education level: Some college, no degree  Occupational History   Occupation: retired  Tobacco Use   Smoking status: Never   Smokeless tobacco: Never  Vaping Use   Vaping status: Never Used  Substance and Sexual Activity   Alcohol use: Yes    Alcohol/week: 1.0 - 2.0 standard drink of alcohol    Types: 1 - 2 Glasses of wine per week    Comment: 4 times a month   Drug use: No   Sexual activity: Not Currently  Other Topics Concern   Not on file  Social History Narrative   Husband passed away 2022/06/27 - one daughter that lives in [place] (adopted) and one son (adopted)   Left handed   Caffeine: caffeine free drinks. 2 cup of coffee a day   Lives with friends    Social Drivers of Health   Financial Resource Strain: High Risk (02/28/2023)  Overall Financial Resource Strain (CARDIA)     Difficulty of Paying Living Expenses: Very hard  Food Insecurity: No Food Insecurity (05/29/2024)   Hunger Vital Sign    Worried About Running Out of Food in the Last Year: Never true    Ran Out of Food in the Last Year: Never true  Transportation Needs: No Transportation Needs (05/29/2024)   PRAPARE - Administrator, Civil Service (Medical): No    Lack of Transportation (Non-Medical): No  Physical Activity: Inactive (05/29/2024)   Exercise Vital Sign    Days of Exercise per Week: 0 days    Minutes of Exercise per Session: 0 min  Stress: No Stress Concern Present (05/29/2024)   [employer] of Occupational Health - Occupational Stress Questionnaire    Feeling of Stress: Not at all  Social Connections: Socially Isolated (05/29/2024)   Social Connection and Isolation Panel    Frequency of Communication with Friends and Family: More than three times a week    Frequency of Social Gatherings with Friends and Family: More than three times a week    Attends Religious Services: Never    Database Administrator or Organizations: No    Attends Banker Meetings: Never    Marital Status: Widowed    Objective:  BP 126/68   Pulse 80   Temp 98 F (36.7 C) (Temporal)   Resp 18   Ht 5' 7 (1.702 m)   Wt 155 lb 9.6 oz (70.6 kg)   SpO2 99%   BMI 24.37 kg/m      05/29/2024   11:13 AM 05/29/2024   10:37 AM 05/12/2024    2:53 PM  BP/Weight  Systolic BP 126 126 129  Diastolic BP 68 68 78  Wt. (Lbs) 155.6 155.6 157.6  BMI 24.37 kg/m2 24.37 kg/m2 24.68 kg/m2    Physical Exam Vitals reviewed.  Constitutional:      General: She is not in acute distress.    Appearance: Normal appearance. She is normal weight. She is not ill-appearing.  Eyes:     Conjunctiva/sclera: Conjunctivae normal.  Cardiovascular:     Rate and Rhythm: Normal rate and regular rhythm.     Heart sounds: Normal heart sounds. No murmur heard. Pulmonary:     Effort: Pulmonary effort is  normal. No respiratory distress.     Breath sounds: Normal breath sounds. No wheezing.  Abdominal:     Palpations: Abdomen is soft.  Musculoskeletal:        General: Normal range of motion.  Skin:    General: Skin is warm.  Neurological:     Mental Status: She is alert. Mental status is at baseline.  Psychiatric:        Mood and Affect: Mood normal.        Behavior: Behavior normal.     Lab Results  Component Value Date   WBC 7.1 03/31/2024   HGB 11.7 03/31/2024   HCT 37.3 03/31/2024   PLT 521 (H) 03/31/2024   GLUCOSE 104 (H) 03/31/2024   CHOL 139 12/26/2023   TRIG 111 12/26/2023   HDL 81 12/26/2023   LDLCALC 39 12/26/2023   ALT 52 (H) 03/31/2024   AST 118 (H) 03/31/2024   NA 139 03/31/2024   K 5.2 03/31/2024   CL 101 03/31/2024   CREATININE 0.97 03/31/2024   BUN 18 03/31/2024   CO2 22 03/31/2024   TSH 5.020 (H) 03/31/2024   HGBA1C 5.6 03/31/2024    Results for orders  placed or performed in visit on 03/31/24  POCT glycosylated hemoglobin (Hb A1C)   Collection Time: 03/31/24  3:42 PM  Result Value Ref Range   Hemoglobin A1C     HbA1c POC (<> result, manual entry) 5.6 4.0 - 5.6 %   HbA1c, POC (prediabetic range)     HbA1c, POC (controlled diabetic range)    POCT Lipid Panel   Collection Time: 03/31/24  3:42 PM  Result Value Ref Range   TC 230    HDL 90    TRG 138    LDL 113    Non-HDL 140    TC/HDL    CBC with Differential/Platelet   Collection Time: 03/31/24  4:26 PM  Result Value Ref Range   WBC 7.1 3.4 - 10.8 x10E3/uL   RBC 3.91 3.77 - 5.28 x10E6/uL   Hemoglobin 11.7 11.1 - 15.9 g/dL   Hematocrit 62.6 65.9 - 46.6 %   MCV 95 79 - 97 fL   MCH 29.9 26.6 - 33.0 pg   MCHC 31.4 (L) 31.5 - 35.7 g/dL   RDW 86.9 88.2 - 84.5 %   Platelets 521 (H) 150 - 450 x10E3/uL   Neutrophils 44 Not Estab. %   Lymphs 44 Not Estab. %   Monocytes 8 Not Estab. %   Eos 3 Not Estab. %   Basos 1 Not Estab. %   Neutrophils Absolute 3.1 1.4 - 7.0 x10E3/uL   Lymphocytes  Absolute 3.2 (H) 0.7 - 3.1 x10E3/uL   Monocytes Absolute 0.5 0.1 - 0.9 x10E3/uL   EOS (ABSOLUTE) 0.2 0.0 - 0.4 x10E3/uL   Basophils Absolute 0.1 0.0 - 0.2 x10E3/uL   Immature Granulocytes 0 Not Estab. %   Immature Grans (Abs) 0.0 0.0 - 0.1 x10E3/uL  Comprehensive metabolic panel with GFR   Collection Time: 03/31/24  4:26 PM  Result Value Ref Range   Glucose 104 (H) 70 - 99 mg/dL   BUN 18 8 - 27 mg/dL   Creatinine, Ser 9.02 0.57 - 1.00 mg/dL   eGFR 61 >40 fO/fpw/8.26   BUN/Creatinine Ratio 19 12 - 28   Sodium 139 134 - 144 mmol/L   Potassium 5.2 3.5 - 5.2 mmol/L   Chloride 101 96 - 106 mmol/L   CO2 22 20 - 29 mmol/L   Calcium  9.5 8.7 - 10.3 mg/dL   Total Protein 6.1 6.0 - 8.5 g/dL   Albumin 4.0 3.8 - 4.8 g/dL   Globulin, Total 2.1 1.5 - 4.5 g/dL   Bilirubin Total 0.8 0.0 - 1.2 mg/dL   Alkaline Phosphatase 478 (H) 49 - 135 IU/L   AST 118 (H) 0 - 40 IU/L   ALT 52 (H) 0 - 32 IU/L  T4, free   Collection Time: 03/31/24  4:26 PM  Result Value Ref Range   Free T4 1.28 0.82 - 1.77 ng/dL  TSH   Collection Time: 03/31/24  4:26 PM  Result Value Ref Range   TSH 5.020 (H) 0.450 - 4.500 uIU/mL  VITAMIN D  25 Hydroxy (Vit-D Deficiency, Fractures)   Collection Time: 03/31/24  4:26 PM  Result Value Ref Range   Vit D, 25-Hydroxy 47.6 30.0 - 100.0 ng/mL  .  Assessment & Plan:   Assessment & Plan Bipolar disorder with severe depression (HCC) Major depressive disorder Effexor  was effective but discontinued due to refill issue. Restarting necessary. - Restart Effexor  at 75 mg once daily for one week. - Increase Effexor  to 75 mg twice daily after one week. - Sent prescription for a  90-day supply of Effexor .  Orders:   venlafaxine  XR (EFFEXOR -XR) 75 MG 24 hr capsule; Take 1 capsule (75 mg total) by mouth in the morning and at bedtime.   Follow-up: Return in about 6 weeks (around 07/07/2024) for chronic, fasting.  An After Visit Summary was printed and given to the patient.  Harrie Cedar, FNP [facility] [phone]

## 2024-05-29 NOTE — Progress Notes (Signed)
 Chief Complaint  Patient presents with   Annual Exam     Subjective:   April Munoz is a 73 y.o. female who presents for a Medicare Annual Wellness Visit.  Allergies (verified) Atorvastatin    History: Past Medical History:  Diagnosis Date   Anxiety    Arthritis    Ataxic gait    Atrial fibrillation (HCC)    Atrophy of thyroid     B12 deficiency 10/31/2017   Cellulitis of abdominal wall 07/07/2016   Chronic anticoagulation 05/31/2018   Depression    Depression, major, recurrent, mild 09/16/2019   Diabetes mellitus without complication (HCC)    Diabetic glomerulopathy (HCC) 09/16/2019   Esophageal stricture 06/05/2017   Essential hypertension 02/24/2013   Fatigue 01/20/2020   Fibromyalgia 02/24/2013   Full dentures    Hereditary and idiopathic peripheral neuropathy 02/24/2013   High risk medication use 11/07/2017   Hyperlipidemia 02/24/2013   Hypertension    Hypertensive heart disease 02/24/2013   Incontinence    Insomnia    Longstanding persistent atrial fibrillation (HCC) 09/16/2019   Mild vitamin D  deficiency 09/16/2019   Mixed hyperlipidemia 02/24/2013   Murmur, cardiac 08/15/2015   Neuropathy in diabetes (HCC) 10/31/2017   Osteoporosis    Other amnesia    Other amnesia    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    Paroxysmal atrial fibrillation (HCC) 08/15/2015   CHADS2vasc=3 CHADS2vasc=3   Primary insomnia    Restless legs syndrome 03/14/2013   Secondary hypothyroidism 09/16/2019   Thoracic or lumbosacral neuritis or radiculitis 02/24/2013   Type 2 diabetes mellitus, without long-term current use of insulin (HCC) 02/24/2013   Urge incontinence of urine 09/16/2019   Wears glasses    Past Surgical History:  Procedure Laterality Date   A-FLUTTER ABLATION N/A 07/30/2019   Procedure: A-FLUTTER ABLATION;  Surgeon: Inocencio Soyla Lunger, MD;  Location: [facility];  Service: Cardiovascular;  Laterality: N/A;   ATRIAL FIBRILLATION ABLATION N/A 03/21/2019    Procedure: ATRIAL FIBRILLATION ABLATION;  Surgeon: Inocencio Soyla Lunger, MD;  Location: [facility];  Service: Cardiovascular;  Laterality: N/A;   ATRIAL FIBRILLATION ABLATION N/A 10/27/2021   Procedure: ATRIAL FIBRILLATION ABLATION;  Surgeon: Inocencio Soyla Lunger, MD;  Location: [facility];  Service: Cardiovascular;  Laterality: N/A;   BREAST REDUCTION SURGERY  1983   BUBBLE STUDY  11/26/2020   Procedure: BUBBLE STUDY;  Surgeon: Mona Vinie BROCKS, MD;  Location: [facility];  Service: Cardiovascular;;   CARDIOVERSION N/A 11/26/2020   Procedure: CARDIOVERSION;  Surgeon: Mona Vinie BROCKS, MD;  Location: [facility];  Service: Cardiovascular;  Laterality: N/A;   CATARACT EXTRACTION, BILATERAL  2018, 2019   CHOLECYSTECTOMY  1971   open   DIAGNOSTIC LAPAROSCOPY  2010   lysis of adhesions   DILATION AND CURETTAGE OF UTERUS  2004   KNEE ARTHROSCOPY  1998   left   KNEE ARTHROSCOPY W/ LATERAL RETINACULAR REPAIR     MASS EXCISION Left 11/11/2013   Procedure: EXCISION MUCOID CYST LEFT INDEX FINGER/DEBRIDEMENT LEFT INDEX FINGER;  Surgeon: Arley JONELLE Curia, MD;  Location: [facility];  Service: Orthopedics;  Laterality: Left;  ANESTHESIA: IV REGIONAL/FAB   SHOULDER ARTHROSCOPY W/ ROTATOR CUFF REPAIR  2007   left   TEE WITHOUT CARDIOVERSION N/A 11/26/2020   Procedure: TRANSESOPHAGEAL ECHOCARDIOGRAM (TEE);  Surgeon: Mona Vinie BROCKS, MD;  Location: [facility];  Service: Cardiovascular;  Laterality: N/A;   TRIGGER FINGER RELEASE Left 11/11/2013   Procedure: RELEASE A-1 PULLEY LEFT  RING FINGER;  Surgeon: Arley JONELLE Curia, MD;  Location: [facility];  Service: Orthopedics;  Laterality: Left;   UMBILICAL HERNIA REPAIR  2008, 2010   Family History  Problem Relation Age of Onset   Atrial fibrillation Mother    Heart attack Mother    Heart failure Father    Heart disease Brother    Heart attack Brother    Bladder Cancer Brother    Diabetes Maternal Grandmother    Heart  failure Paternal Grandfather    Stroke Paternal Grandfather    Social History   Occupational History   Occupation: retired  Tobacco Use   Smoking status: Never   Smokeless tobacco: Never  Vaping Use   Vaping status: Never Used  Substance and Sexual Activity   Alcohol use: Yes    Alcohol/week: 1.0 - 2.0 standard drink of alcohol    Types: 1 - 2 Glasses of wine per week    Comment: 4 times a month   Drug use: No   Sexual activity: Not Currently   Tobacco Counseling Counseling given: Not Answered  SDOH Screenings   Food Insecurity: No Food Insecurity (05/29/2024)  Housing: Unknown (05/29/2024)  Transportation Needs: No Transportation Needs (05/29/2024)  Utilities: Not At Risk (05/29/2024)  Alcohol Screen: Low Risk  (02/28/2023)  Depression (PHQ2-9): High Risk (05/29/2024)  Financial Resource Strain: High Risk (02/28/2023)  Physical Activity: Inactive (05/29/2024)  Social Connections: Socially Isolated (05/29/2024)  Stress: No Stress Concern Present (05/29/2024)  Tobacco Use: Low Risk  (05/29/2024)  Health Literacy: Adequate Health Literacy (05/29/2024)   See flowsheets for full screening details  Depression Screen PHQ 2 & 9 Depression Scale- Over the past 2 weeks, how often have you been bothered by any of the following problems? Little interest or pleasure in doing things: 1 Feeling down, depressed, or hopeless (PHQ Adolescent also includes...irritable): 2 PHQ-2 Total Score: 3 Trouble falling or staying asleep, or sleeping too much: 3 Feeling tired or having little energy: 3 Poor appetite or overeating (PHQ Adolescent also includes...weight loss): 3 Feeling bad about yourself - or that you are a failure or have let yourself or your family down: 0 Trouble concentrating on things, such as reading the newspaper or watching television (PHQ Adolescent also includes...like school work): 3 Moving or speaking so slowly that other people could have noticed. Or the opposite -  being so fidgety or restless that you have been moving around a lot more than usual: 2 Thoughts that you would be better off dead, or of hurting yourself in some way: 0 PHQ-9 Total Score: 17 If you checked off any problems, how difficult have these problems made it for you to do your work, take care of things at home, or get along with other people?: Somewhat difficult  Depression Treatment Depression Interventions/Treatment : Currently on Treatment     Goals Addressed               This Visit's Progress     Patient Stated (pt-stated)        Patient states she would like to exercise more.        Visit info / Clinical Intake: Medicare Wellness Visit Type:: Subsequent Annual Wellness Visit Persons participating in visit:: patient Medicare Wellness Visit Mode:: In-person (required for WTM) Information given by:: patient Interpreter Needed?: No Pre-visit prep was completed: no AWV questionnaire completed by patient prior to visit?: no Living arrangements:: (!) lives alone Patient's Overall Health Status Rating: very good Typical amount of pain:  none Does pain affect daily life?: no Are you currently prescribed opioids?: no  Dietary Habits and Nutritional Risks How many meals a day?: 2 Eats fruit and vegetables daily?: yes Most meals are obtained by: preparing own meals; eating out; having others provide food In the last 2 weeks, have you had any of the following?: none Diabetic:: (!) yes Any non-healing wounds?: no How often do you check your BS?: as needed Would you like to be referred to a Nutritionist or for Diabetic Management? : no  Functional Status Activities of Daily Living (to include ambulation/medication): Independent Ambulation: Independent Medication Administration: Needs assistance (comment) (friend does for patient) Home Management: Independent (friend does for patient) Manage your own finances?: yes Primary transportation is: family/friends Concerns  about vision?: no *vision screening is required for WTM* Concerns about hearing?: no  Fall Screening Falls in the past year?: 1 Number of falls in past year: 0 Was there an injury with Fall?: 1 Fall Risk Category Calculator: 2 Patient Fall Risk Level: Moderate Fall Risk  Fall Risk Patient at Risk for Falls Due to: History of fall(s) Fall risk Follow up: Falls evaluation completed  Home and Transportation Safety: All rugs have non-skid backing?: yes All stairs or steps have railings?: yes Grab bars in the bathtub or shower?: yes Have non-skid surface in bathtub or shower?: yes Good home lighting?: yes Regular seat belt use?: yes Hospital stays in the last year:: no  Cognitive Assessment Difficulty concentrating, remembering, or making decisions? : yes Will 6CIT or Mini Cog be Completed: yes What year is it?: 0 points What month is it?: 0 points Give patient an address phrase to remember (5 components): [address] [place] About what time is it?: 0 points Count backwards from 20 to 1: 0 points Say the months of the year in reverse: 0 points Repeat the address phrase from earlier: 4 points 6 CIT Score: 4 points  Advance Directives (For Healthcare) Does Patient Have a Medical Advance Directive?: Yes Type of Advance Directive: Living will Copy of Living Will in Chart?: No - copy requested  Reviewed/Updated  Reviewed/Updated: Reviewed All (Medical, Surgical, Family, Medications, Allergies, Care Teams, Patient Goals)        Objective:    Today's Vitals   05/29/24 1113  BP: 126/68  Pulse: 80  Temp: 98 F (36.7 C)  SpO2: 99%  Weight: 155 lb 9.6 oz (70.6 kg)  Height: 5' 7 (1.702 m)   Body mass index is 24.37 kg/m.  Current Medications (verified) Outpatient Encounter Medications as of 05/29/2024  Medication Sig   B-D 3CC LUER-LOK SYR 25GX1 25G X 1 3 ML MISC USE WITH B12 INJECTIONS   carbidopa -levodopa  (SINEMET  IR) 25-100 MG tablet Take 1-2 tablets by  mouth 3 (three) times daily before meals.   donepezil  (ARICEPT ) 5 MG tablet TAKE 1 TABLET BY MOUTH EVERYDAY AT BEDTIME   EPINEPHRINE  0.3 mg/0.3 mL IJ SOAJ injection INJECT 0.3 MG INTRAMUSCULARLY AS NEEDED FOR anaphylaxis   levothyroxine  (SYNTHROID ) 25 MCG tablet Take 1 tablet (25 mcg total) by mouth daily.   lurasidone  (LATUDA ) 80 MG TABS tablet Take 1 tablet (80 mg total) by mouth daily with breakfast.   metFORMIN  (GLUCOPHAGE ) 1000 MG tablet Take 1 tablet (1,000 mg total) by mouth 2 (two) times daily with a meal.   metoprolol  succinate (TOPROL  XL) 50 MG 24 hr tablet Take 1 tablet (50 mg total) by mouth at bedtime. Take with or immediately following a meal.   mirabegron  ER (MYRBETRIQ )  25 MG TB24 tablet Take 1 tablet (25 mg total) by mouth daily.   omega-3 acid ethyl esters (LOVAZA ) 1 g capsule Take 2 capsules (2 g total) by mouth 2 (two) times daily.   rivaroxaban  (XARELTO ) 20 MG TABS tablet TAKE 1 TABLET BY MOUTH DAILY WITH SUPPER.   rosuvastatin  (CRESTOR ) 5 MG tablet Take 1 tablet (5 mg total) by mouth daily.   Suvorexant  (BELSOMRA ) 10 MG TABS Take 1 tablet (10 mg total) by mouth at bedtime as needed.   Syringe/Needle, Disp, (SYRINGE 3CC/27GX1-1/4) 27G X 1-1/4 3 ML MISC 1 each by Does not apply route every 30 (thirty) days.   torsemide  (DEMADEX ) 20 MG tablet Take 1 tablet (20 mg total) by mouth 3 (three) times a week.   valsartan  (DIOVAN ) 80 MG tablet TAKE 1 TABLET BY MOUTH EVERY DAY   venlafaxine  XR (EFFEXOR -XR) 75 MG 24 hr capsule Take 1 capsule (75 mg total) by mouth in the morning and at bedtime.   Vitamin D , Ergocalciferol , (DRISDOL ) 1.25 MG (50000 UNIT) CAPS capsule Take 1 capsule (50,000 Units total) by mouth once a week.   No facility-administered encounter medications on file as of 05/29/2024.   Hearing/Vision screen No results found. Immunizations and Health Maintenance Health Maintenance  Topic Date Due   Hepatitis C Screening  Never done   DTaP/Tdap/Td (1 - Tdap) Never done    Zoster Vaccines- Shingrix (1 of 2) Never done   DEXA SCAN  04/02/2023   FOOT EXAM  01/10/2024   OPHTHALMOLOGY EXAM  02/27/2024   COVID-19 Vaccine (6 - 2025-26 season) 03/17/2024   Diabetic kidney evaluation - Urine ACR  06/11/2024   HEMOGLOBIN A1C  09/28/2024   Diabetic kidney evaluation - eGFR measurement  03/31/2025   Medicare Annual Wellness (AWV)  05/29/2025   Colonoscopy  01/12/2031   Pneumococcal Vaccine: 50+ Years  Completed   Influenza Vaccine  Completed   Meningococcal B Vaccine  Aged Out   Mammogram  Discontinued        Assessment/Plan:  This is a routine wellness examination for [place].  Patient Care Team: Sherre Clapper, MD as PCP - General (Family Medicine) Monetta Redell PARAS, MD as PCP - Cardiology (Cardiology) Inocencio Soyla Lunger, MD as PCP - Electrophysiology (Cardiology) Monetta Redell PARAS, MD as Consulting Physician (Cardiology) Misenheimer, Evalene, MD as Consulting Physician (Unknown Physician Specialty) Joesph Lynwood DEL, MD as Consulting Physician (General Surgery) Beverli Cara PARAS, [facility] (Inactive) (Pharmacist)  I have personally reviewed and noted the following in the patient's chart:   Medical and social history Use of alcohol, tobacco or illicit drugs  Current medications and supplements including opioid prescriptions. Functional ability and status Nutritional status Physical activity Advanced directives List of other physicians Hospitalizations, surgeries, and ER visits in previous 12 months Vitals Screenings to include cognitive, depression, and falls Referrals and appointments  No orders of the defined types were placed in this encounter.  In addition, I have reviewed and discussed with patient certain preventive protocols, quality metrics, and best practice recommendations. A written personalized care plan for preventive services as well as general preventive health recommendations were provided to patient.   Coolidge Mailman, CMA   05/29/2024    Return in 1 year (on 05/29/2025).  After Visit Summary: (In Person-Declined) Patient declined AVS at this time.  Nurse Notes: I spent 25 minutes with the patient and her friend ([name]). Patient and her friend has no questions or concerns at this time.

## 2024-05-29 NOTE — Assessment & Plan Note (Signed)
 Major depressive disorder Effexor  was effective but discontinued due to refill issue. Restarting necessary. - Restart Effexor  at 75 mg once daily for one week. - Increase Effexor  to 75 mg twice daily after one week. - Sent prescription for a 90-day supply of Effexor .  Orders:   venlafaxine  XR (EFFEXOR -XR) 75 MG 24 hr capsule; Take 1 capsule (75 mg total) by mouth in the morning and at bedtime.

## 2024-05-29 NOTE — Patient Instructions (Signed)
 [name] , Thank you for taking time to come for your Medicare Wellness Visit. I appreciate your ongoing commitment to your health goals. Please review the following plan we discussed and let me know if I can assist you in the future.   These are the goals we discussed:  Goals       Learn More About My Health      Timeframe:  Long-Range Goal Priority:  High Start Date:       08/30/2020                      Expected End Date:         08/30/2021               Follow Up Date 12/02/2020    - make a list of questions - ask questions - repeat what I heard to make sure I understand - bring a list of my medicines to the visit    Why is this important?   The best way to learn about your health and care is by talking to the doctor and nurse.  They will answer your questions and give you information in the way that you like best.    Notes:       Manage My Medicine      Timeframe:  Long-Range Goal Priority:  High Start Date:          08/30/2020                   Expected End Date:            08/30/2021           Follow Up Date 12/02/2020    - call for medicine refill 2 or 3 days before it runs out - keep a list of all the medicines I take; vitamins and herbals too - use a pillbox to sort medicine    Why is this important?   These steps will help you keep on track with your medicines.   Notes:       Patient Stated (pt-stated)      Patient states she would like to exercise more.       Prevent falls      02/28/2023 AWV Goal: Fall Prevention  Over the next year, patient will decrease their risk for falls by: Using assistive devices, such as a cane or walker, as needed Identifying fall risks within their home and correcting them by: Removing throw rugs Adding handrails to stairs or ramps Removing clutter and keeping a clear pathway throughout the home Increasing light, especially at night Adding shower handles/bars Raising toilet seat Identifying potential personal risk  factors for falls: Medication side effects Incontinence/urgency Vestibular dysfunction Hearing loss Musculoskeletal disorders Neurological disorders Orthostatic hypotension        Set My Target A1C-Diabetes Type 2      Timeframe:  Long-Range Goal Priority:  High Start Date:        08/30/2020                     Expected End Date:     08/30/2021                  Follow Up Date 12/02/2020    - set target A1C    Why is this important?   Your target A1C is decided together by you and your doctor.  It is based on several things  like your age and other health issues.    Notes:         This is a list of the screening recommended for you and due dates:  Health Maintenance  Topic Date Due   Hepatitis C Screening  Never done   DTaP/Tdap/Td vaccine (1 - Tdap) Never done   Zoster (Shingles) Vaccine (1 of 2) Never done   DEXA scan (bone density measurement)  04/02/2023   Complete foot exam   01/10/2024   Eye exam for diabetics  02/27/2024   COVID-19 Vaccine (6 - 2025-26 season) 03/17/2024   Yearly kidney health urinalysis for diabetes  06/11/2024   Hemoglobin A1C  09/28/2024   Yearly kidney function blood test for diabetes  03/31/2025   Medicare Annual Wellness Visit  05/29/2025   Colon Cancer Screening  01/12/2031   Pneumococcal Vaccine for age over 11  Completed   Flu Shot  Completed   Meningitis B Vaccine  Aged Out   Breast Cancer Screening  Discontinued    Advanced directives: yes, living will   Next Annual wellness visit: 11.18.2026 at 11:30 am.    Preventive Care 65 Years and Older, Female Preventive care refers to lifestyle choices and visits with your health care provider that can promote health and wellness. What does preventive care include? A yearly physical exam. This is also called an annual well check. Dental exams once or twice a year. Routine eye exams. Ask your health care provider how often you should have your eyes checked. Personal lifestyle  choices, including: Daily care of your teeth and gums. Regular physical activity. Eating a healthy diet. Avoiding tobacco and drug use. Limiting alcohol use. Practicing safe sex. Taking low-dose aspirin every day. Taking vitamin and mineral supplements as recommended by your health care provider. What happens during an annual well check? The services and screenings done by your health care provider during your annual well check will depend on your age, overall health, lifestyle risk factors, and family history of disease. Counseling  Your health care provider may ask you questions about your: Alcohol use. Tobacco use. Drug use. Emotional well-being. Home and relationship well-being. Sexual activity. Eating habits. History of falls. Memory and ability to understand (cognition). Work and work astronomer. Reproductive health. Screening  You may have the following tests or measurements: Height, weight, and BMI. Blood pressure. Lipid and cholesterol levels. These may be checked every 5 years, or more frequently if you are over 28 years old. Skin check. Lung cancer screening. You may have this screening every year starting at age 63 if you have a 30-pack-year history of smoking and currently smoke or have quit within the past 15 years. Fecal occult blood test (FOBT) of the stool. You may have this test every year starting at age 55. Flexible sigmoidoscopy or colonoscopy. You may have a sigmoidoscopy every 5 years or a colonoscopy every 10 years starting at age 39. Hepatitis C blood test. Hepatitis B blood test. Sexually transmitted disease (STD) testing. Diabetes screening. This is done by checking your blood sugar (glucose) after you have not eaten for a while (fasting). You may have this done every 1-3 years. Bone density scan. This is done to screen for osteoporosis. You may have this done starting at age 79. Mammogram. This may be done every 1-2 years. Talk to your health care  provider about how often you should have regular mammograms. Talk with your health care provider about your test results, treatment options, and if necessary, the need  for more tests. Vaccines  Your health care provider may recommend certain vaccines, such as: Influenza vaccine. This is recommended every year. Tetanus, diphtheria, and acellular pertussis (Tdap, Td) vaccine. You may need a Td booster every 10 years. Zoster vaccine. You may need this after age 85. Pneumococcal 13-valent conjugate (PCV13) vaccine. One dose is recommended after age 39. Pneumococcal polysaccharide (PPSV23) vaccine. One dose is recommended after age 28. Talk to your health care provider about which screenings and vaccines you need and how often you need them. This information is not intended to replace advice given to you by your health care provider. Make sure you discuss any questions you have with your health care provider. Document Released: 07/30/2015 Document Revised: 03/22/2016 Document Reviewed: 05/04/2015 Elsevier Interactive Patient Education  2017 [employer].  Fall Prevention in the Home Falls can cause injuries. They can happen to people of all ages. There are many things you can do to make your home safe and to help prevent falls. What can I do on the outside of my home? Regularly fix the edges of walkways and driveways and fix any cracks. Remove anything that might make you trip as you walk through a door, such as a raised step or threshold. Trim any bushes or trees on the path to your home. Use bright outdoor lighting. Clear any walking paths of anything that might make someone trip, such as rocks or tools. Regularly check to see if handrails are loose or broken. Make sure that both sides of any steps have handrails. Any raised decks and porches should have guardrails on the edges. Have any leaves, snow, or ice cleared regularly. Use sand or salt on walking paths during winter. Clean up any  spills in your garage right away. This includes oil or grease spills. What can I do in the bathroom? Use night lights. Install grab bars by the toilet and in the tub and shower. Do not use towel bars as grab bars. Use non-skid mats or decals in the tub or shower. If you need to sit down in the shower, use a plastic, non-slip stool. Keep the floor dry. Clean up any water that spills on the floor as soon as it happens. Remove soap buildup in the tub or shower regularly. Attach bath mats securely with double-sided non-slip rug tape. Do not have throw rugs and other things on the floor that can make you trip. What can I do in the bedroom? Use night lights. Make sure that you have a light by your bed that is easy to reach. Do not use any sheets or blankets that are too big for your bed. They should not hang down onto the floor. Have a firm chair that has side arms. You can use this for support while you get dressed. Do not have throw rugs and other things on the floor that can make you trip. What can I do in the kitchen? Clean up any spills right away. Avoid walking on wet floors. Keep items that you use a lot in easy-to-reach places. If you need to reach something above you, use a strong step stool that has a grab bar. Keep electrical cords out of the way. Do not use floor polish or wax that makes floors slippery. If you must use wax, use non-skid floor wax. Do not have throw rugs and other things on the floor that can make you trip. What can I do with my stairs? Do not leave any items on the stairs. Make  sure that there are handrails on both sides of the stairs and use them. Fix handrails that are broken or loose. Make sure that handrails are as long as the stairways. Check any carpeting to make sure that it is firmly attached to the stairs. Fix any carpet that is loose or worn. Avoid having throw rugs at the top or bottom of the stairs. If you do have throw rugs, attach them to the floor  with carpet tape. Make sure that you have a light switch at the top of the stairs and the bottom of the stairs. If you do not have them, ask someone to add them for you. What else can I do to help prevent falls? Wear shoes that: Do not have high heels. Have rubber bottoms. Are comfortable and fit you well. Are closed at the toe. Do not wear sandals. If you use a stepladder: Make sure that it is fully opened. Do not climb a closed stepladder. Make sure that both sides of the stepladder are locked into place. Ask someone to hold it for you, if possible. Clearly mark and make sure that you can see: Any grab bars or handrails. First and last steps. Where the edge of each step is. Use tools that help you move around (mobility aids) if they are needed. These include: Canes. Walkers. Scooters. Crutches. Turn on the lights when you go into a dark area. Replace any light bulbs as soon as they burn out. Set up your furniture so you have a clear path. Avoid moving your furniture around. If any of your floors are uneven, fix them. If there are any pets around you, be aware of where they are. Review your medicines with your doctor. Some medicines can make you feel dizzy. This can increase your chance of falling. Ask your doctor what other things that you can do to help prevent falls. This information is not intended to replace advice given to you by your health care provider. Make sure you discuss any questions you have with your health care provider. Document Released: 04/29/2009 Document Revised: 12/09/2015 Document Reviewed: 08/07/2014 Elsevier Interactive Patient Education  2017 [employer].

## 2024-06-03 ENCOUNTER — Telehealth: Payer: Self-pay | Admitting: Family Medicine

## 2024-06-03 NOTE — Telephone Encounter (Signed)
 [facility] CAP Program Forms

## 2024-06-08 DIAGNOSIS — G20A1 Parkinson's disease without dyskinesia, without mention of fluctuations: Secondary | ICD-10-CM | POA: Diagnosis not present

## 2024-06-08 DIAGNOSIS — E785 Hyperlipidemia, unspecified: Secondary | ICD-10-CM | POA: Diagnosis not present

## 2024-06-08 DIAGNOSIS — D631 Anemia in chronic kidney disease: Secondary | ICD-10-CM | POA: Diagnosis not present

## 2024-06-08 DIAGNOSIS — I482 Chronic atrial fibrillation, unspecified: Secondary | ICD-10-CM | POA: Diagnosis not present

## 2024-06-08 DIAGNOSIS — I4892 Unspecified atrial flutter: Secondary | ICD-10-CM | POA: Diagnosis not present

## 2024-06-08 DIAGNOSIS — E119 Type 2 diabetes mellitus without complications: Secondary | ICD-10-CM | POA: Diagnosis not present

## 2024-06-08 DIAGNOSIS — Z9049 Acquired absence of other specified parts of digestive tract: Secondary | ICD-10-CM | POA: Diagnosis not present

## 2024-06-08 DIAGNOSIS — I120 Hypertensive chronic kidney disease with stage 5 chronic kidney disease or end stage renal disease: Secondary | ICD-10-CM | POA: Diagnosis not present

## 2024-06-08 DIAGNOSIS — T383X5A Adverse effect of insulin and oral hypoglycemic [antidiabetic] drugs, initial encounter: Secondary | ICD-10-CM | POA: Diagnosis not present

## 2024-06-08 DIAGNOSIS — N19 Unspecified kidney failure: Secondary | ICD-10-CM | POA: Diagnosis not present

## 2024-06-08 DIAGNOSIS — D638 Anemia in other chronic diseases classified elsewhere: Secondary | ICD-10-CM | POA: Diagnosis not present

## 2024-06-08 DIAGNOSIS — E872 Acidosis, unspecified: Secondary | ICD-10-CM | POA: Diagnosis not present

## 2024-06-08 DIAGNOSIS — E8721 Acute metabolic acidosis: Secondary | ICD-10-CM | POA: Diagnosis not present

## 2024-06-08 DIAGNOSIS — N179 Acute kidney failure, unspecified: Secondary | ICD-10-CM | POA: Diagnosis not present

## 2024-06-08 DIAGNOSIS — I953 Hypotension of hemodialysis: Secondary | ICD-10-CM | POA: Diagnosis not present

## 2024-06-08 DIAGNOSIS — K8050 Calculus of bile duct without cholangitis or cholecystitis without obstruction: Secondary | ICD-10-CM | POA: Diagnosis not present

## 2024-06-08 DIAGNOSIS — E875 Hyperkalemia: Secondary | ICD-10-CM | POA: Diagnosis not present

## 2024-06-08 DIAGNOSIS — R6521 Severe sepsis with septic shock: Secondary | ICD-10-CM | POA: Diagnosis not present

## 2024-06-08 DIAGNOSIS — N170 Acute kidney failure with tubular necrosis: Secondary | ICD-10-CM | POA: Diagnosis not present

## 2024-06-08 DIAGNOSIS — N390 Urinary tract infection, site not specified: Secondary | ICD-10-CM | POA: Diagnosis not present

## 2024-06-08 DIAGNOSIS — G9341 Metabolic encephalopathy: Secondary | ICD-10-CM | POA: Diagnosis not present

## 2024-06-08 DIAGNOSIS — F0283 Dementia in other diseases classified elsewhere, unspecified severity, with mood disturbance: Secondary | ICD-10-CM | POA: Diagnosis not present

## 2024-06-08 DIAGNOSIS — K838 Other specified diseases of biliary tract: Secondary | ICD-10-CM | POA: Diagnosis not present

## 2024-06-08 DIAGNOSIS — A419 Sepsis, unspecified organism: Secondary | ICD-10-CM | POA: Diagnosis not present

## 2024-06-08 DIAGNOSIS — R4182 Altered mental status, unspecified: Secondary | ICD-10-CM | POA: Diagnosis not present

## 2024-06-08 DIAGNOSIS — D519 Vitamin B12 deficiency anemia, unspecified: Secondary | ICD-10-CM | POA: Diagnosis not present

## 2024-06-08 DIAGNOSIS — R410 Disorientation, unspecified: Secondary | ICD-10-CM | POA: Diagnosis not present

## 2024-06-08 DIAGNOSIS — E1122 Type 2 diabetes mellitus with diabetic chronic kidney disease: Secondary | ICD-10-CM | POA: Diagnosis not present

## 2024-06-08 DIAGNOSIS — R7401 Elevation of levels of liver transaminase levels: Secondary | ICD-10-CM | POA: Diagnosis not present

## 2024-06-08 DIAGNOSIS — M1990 Unspecified osteoarthritis, unspecified site: Secondary | ICD-10-CM | POA: Diagnosis not present

## 2024-06-08 DIAGNOSIS — R109 Unspecified abdominal pain: Secondary | ICD-10-CM | POA: Diagnosis not present

## 2024-06-08 DIAGNOSIS — R001 Bradycardia, unspecified: Secondary | ICD-10-CM | POA: Diagnosis not present

## 2024-06-08 DIAGNOSIS — Z992 Dependence on renal dialysis: Secondary | ICD-10-CM | POA: Diagnosis not present

## 2024-06-08 DIAGNOSIS — E8809 Other disorders of plasma-protein metabolism, not elsewhere classified: Secondary | ICD-10-CM | POA: Diagnosis not present

## 2024-06-08 DIAGNOSIS — N2581 Secondary hyperparathyroidism of renal origin: Secondary | ICD-10-CM | POA: Diagnosis not present

## 2024-06-08 DIAGNOSIS — F0284 Dementia in other diseases classified elsewhere, unspecified severity, with anxiety: Secondary | ICD-10-CM | POA: Diagnosis not present

## 2024-06-08 DIAGNOSIS — B029 Zoster without complications: Secondary | ICD-10-CM | POA: Diagnosis not present

## 2024-06-08 DIAGNOSIS — R569 Unspecified convulsions: Secondary | ICD-10-CM | POA: Diagnosis not present

## 2024-06-08 DIAGNOSIS — N186 End stage renal disease: Secondary | ICD-10-CM | POA: Diagnosis not present

## 2024-06-08 DIAGNOSIS — E039 Hypothyroidism, unspecified: Secondary | ICD-10-CM | POA: Diagnosis not present

## 2024-06-09 NOTE — Telephone Encounter (Signed)
 Olam Pouch calling from [facility] is calling to request the box on the  Form Questionnaire  -DO the health care condition require intuitional Care? This needs to be checked.  For the patient to receive CAP. Family is asking for this so that they can receive reimburse to care for the patient. More care is needed. Patient is needing help because she needs a higher level of care.  CB- [phone]

## 2024-06-10 ENCOUNTER — Encounter: Payer: Self-pay | Admitting: Family Medicine

## 2024-06-17 ENCOUNTER — Other Ambulatory Visit: Payer: Self-pay | Admitting: Family Medicine

## 2024-06-17 NOTE — Telephone Encounter (Signed)
 Done. Dr. Sherre

## 2024-06-20 DIAGNOSIS — K838 Other specified diseases of biliary tract: Secondary | ICD-10-CM | POA: Diagnosis not present

## 2024-06-20 DIAGNOSIS — Z9889 Other specified postprocedural states: Secondary | ICD-10-CM | POA: Diagnosis not present

## 2024-06-21 DIAGNOSIS — I499 Cardiac arrhythmia, unspecified: Secondary | ICD-10-CM | POA: Diagnosis not present

## 2024-06-22 ENCOUNTER — Other Ambulatory Visit: Payer: Self-pay | Admitting: Family Medicine

## 2024-06-22 DIAGNOSIS — F314 Bipolar disorder, current episode depressed, severe, without psychotic features: Secondary | ICD-10-CM

## 2024-06-22 NOTE — Discharge Summary (Signed)
 ------------------------------------------------------------------------------- Attestation signed by Franky SHAUNNA Reno, MD at 06/24/2024  3:52 PM I saw and evaluated the April Munoz. I reviewed the trainees note and agree. I performed the service or was physically present during the critical or key portions of the service furnished by the trainee; and I managed the April Munoz.  Goals of care discussions have led to a decision by the very supportive and caring family to move to hospice care for April Munoz and d/c further dialysis.   Franky SHAUNNA Reno, MD 06/24/2024   -------------------------------------------------------------------------------  [facility] General Medicine 1 Discharge Summary  Name: April Munoz MRN: [id number] Age: 73 yrs DOB: 10-02-1950  Admission: 06/19/2024 Admitting Physician: Christine Myron, MD  Discharge: 06/24/2024   Discharge Physician: Franky Reno, MD   Admission Diagnoses:  Common bile duct dilation [K83.8]   Discharge Diagnoses:  Principal Problem:   Common bile duct dilation Active Problems:   AKI (acute kidney injury)   Dependent on hemodialysis   ESRD (end stage renal disease) (CMD) Resolved Problems:   * No resolved hospital problems. *  Admission Condition: poor Discharged Condition: poor  Indication for Hospitalization and Brief Hospital Course: For full details, please see H&P, progress notes, consult notes and ancillary notes. Briefly, April Munoz is a 73 y.o. female with a medical history significant for parox afib (metop and xarelto ), TIAs, HLD, HTN, cholecystectomy, esophageal stricture, hx hernia repair, T2DM c/b neuropathy, cognitive decline, dementia, Parkinson's disease, RLS, fibromyalgia, depression/anxiety, B12 deficiency, osteoporosis.   April Munoz was initially admitted to [facility] on 11/23 for altered mentation and found to have severe AKI.  April Munoz was started on hemodialysis with improvement in mentation.   While at OSH, labs were notable for possible biliary obstruction.  April Munoz also had a brief ICU stay for sepsis/cholangitis. MRCP on 06/17/2024 showing prior cholecystectomy and evidence of choledocholithiasis.   Pt also presented with severe AKI with creatinine in the 6's. April Munoz underwent right IJ PermCath placement at OSH on 12/2 and dialysis initiated there.  Nephrology was consulted on her arrival to [facility] and continued her dialysis as scheduled.  Overall, nephrology thought April Munoz had poor prognosis for renal recovery and expect April Munoz to indefinitely need dialysis.   Pt was transferred here for GI evaluation and underwent EUS-guided jejunogastrostomy using a 20 mm lumen-apposing metal stent on 12/8.  Ongoing goals of care discussions held with family throughout admission. In light of patients age, multiple co-morbidities with underlying dementia as well as severe acute illness and ongoing renal failure w/ low likelihood of recovery, family opted to transition focus of care from life-prolonging measures to prioritizing her comfort on 06/24/2024. HD was discontinued with transition to comfort focused care and hospice agency engaged.   Pt discharged to hospice at home on 12/9. We wish the April Munoz and her family peace during this time of transition.   Discharge Follow-up Action Items: Follow up with home hospice agency.   Disposition: Home Hospice   April Munoz's Ordered Code Status: DNAR / Comfort Care SOTO  Consults: IP CONSULT TO GASTROENTEROLOGY IP CONSULT TO NEPHROLOGY IP CONSULT TO NUTRITION SERVICES IP CONSULT TO ELECTROPHYSIOLOGY IP CONSULT TO SPIRITUAL CARE IP CONSULT TO HOSPICE   April Munoz Instructions:    Discharge Medications     Stopped Medications    acyclovir  400 mg tablet Commonly known as: ZOVIRAX    aspirin 325 mg EC tablet Commonly known as: ECOTRIN   Belsomra  10 mg Tab tablet Generic drug: suvorexant    carbidopa -levodopa  25-100 mg per tablet Commonly  known as:  SINEMET    donepeziL  5 mg tablet Commonly known as: ARICEPT    EPINEPHrine  0.3 mg/0.3 mL injection syringe Commonly known as: EPIPEN    ergocalciferol  1,250 mcg (50,000 unit) capsule Commonly known as: VITAMIN D2   ibuprofen 200 mg tablet Commonly known as: MOTRIN   Latuda  80 mg Tab Generic drug: lurasidone    levothyroxine  25 mcg tablet Commonly known as: SYNTHROID    metFORMIN  1,000 mg tablet Commonly known as: GLUCOPHAGE    metoprolol  succinate 50 mg 24 hr tablet Commonly known as: TOPROL  XL   mirabegron  25 mg Tb24 Commonly known as: MYRBETRIQ    omega 3-dha-epa-fish oil 1,000 mg DR capsule Commonly known as: OMEGA 3   rosuvastatin  5 mg tablet Commonly known as: CRESTOR    torsemide  20 mg tablet Commonly known as: DEMADEX    valsartan  80 mg tablet Commonly known as: DIOVAN    venlafaxine  75 mg 24 hr capsule Commonly known as: EFFEXOR  XR   Xarelto  20 mg tablet Generic drug: rivaroxaban           Follow-up  No future appointments.   To request medical records from this hospitalization, please refer to [email or web address] or call [phone]. Electronically signed by:  Clotilda DELENA Forts, MD 06/24/2024 3:22 PM

## 2024-06-23 DIAGNOSIS — Z9884 Bariatric surgery status: Secondary | ICD-10-CM | POA: Diagnosis not present

## 2024-06-24 DIAGNOSIS — R0902 Hypoxemia: Secondary | ICD-10-CM | POA: Diagnosis not present

## 2024-06-24 DIAGNOSIS — Z743 Need for continuous supervision: Secondary | ICD-10-CM | POA: Diagnosis not present

## 2024-06-24 DIAGNOSIS — N186 End stage renal disease: Secondary | ICD-10-CM | POA: Diagnosis not present

## 2024-06-25 ENCOUNTER — Telehealth: Payer: Self-pay

## 2024-06-25 NOTE — Transitions of Care (Post Inpatient/ED Visit) (Signed)
° °  06/25/2024  Name: April Munoz MRN: [id number] DOB: March 01, 1951  Today's TOC FU Call Status: Today's TOC FU Call Status:: Successful TOC FU Call Completed (Spoke with [name] Hargett who confirms patient discharge home with hospice care) [facility] FU Call Complete Date: 06/25/24  Patient's Name and Date of Birth confirmed. DOB, Name  Transition Care Management Follow-up Telephone Call Date of Discharge: 06/24/24 Discharge Facility: Other ([facility]) Name of [facility] ([facility]) [facility] Facility: [facility] Type of Discharge: Inpatient Admission Primary Inpatient Discharge Diagnosis:: Common bile duct dilation and severe AKI with creatinine in the 6's  RN made outreach to patient/family and confirmed that hospice services are in place.  Patient/family aware to contact hospice 24/7 for any questions or concerns. No further interventions at this time.     Shona Prow RN, CCM   [facility]Population Health RN Care Manager [phone]

## 2024-07-01 ENCOUNTER — Ambulatory Visit: Admitting: Family Medicine

## 2024-07-09 ENCOUNTER — Other Ambulatory Visit: Payer: Self-pay | Admitting: Family Medicine

## 2024-07-10 ENCOUNTER — Other Ambulatory Visit: Payer: Self-pay | Admitting: Family Medicine

## 2024-07-10 DIAGNOSIS — I110 Hypertensive heart disease with heart failure: Secondary | ICD-10-CM

## 2024-08-07 ENCOUNTER — Other Ambulatory Visit: Payer: Self-pay | Admitting: Family Medicine

## 2024-08-12 ENCOUNTER — Telehealth: Payer: Self-pay

## 2024-08-12 NOTE — Telephone Encounter (Signed)
 Per Harlene with [facility]. Patient passed away at 1:37 a.m. on 2024/08/19. Something will be faxed over regarding this information. Called patients pharmacy [facility] and canceled all her prescriptions.

## 2024-08-17 DEATH — deceased

## 2025-01-12 ENCOUNTER — Telehealth: Admitting: Diagnostic Neuroimaging

## 2025-06-03 ENCOUNTER — Ambulatory Visit
